# Patient Record
Sex: Female | Born: 1940
Health system: Southern US, Community
[De-identification: ages and names within clinical notes are randomized; demographics above are authoritative.]

## PROBLEM LIST (undated history)

## (undated) DIAGNOSIS — I2721 Secondary pulmonary arterial hypertension: Secondary | ICD-10-CM

## (undated) DIAGNOSIS — I509 Heart failure, unspecified: Secondary | ICD-10-CM

## (undated) DIAGNOSIS — I440 Atrioventricular block, first degree: Secondary | ICD-10-CM

## (undated) DIAGNOSIS — E8881 Metabolic syndrome: Secondary | ICD-10-CM

## (undated) DIAGNOSIS — I447 Left bundle-branch block, unspecified: Secondary | ICD-10-CM

## (undated) DIAGNOSIS — I4891 Unspecified atrial fibrillation: Secondary | ICD-10-CM

## (undated) DIAGNOSIS — Z7901 Long term (current) use of anticoagulants: Secondary | ICD-10-CM

## (undated) DIAGNOSIS — J449 Chronic obstructive pulmonary disease, unspecified: Secondary | ICD-10-CM

## (undated) DIAGNOSIS — I428 Other cardiomyopathies: Secondary | ICD-10-CM

## (undated) DIAGNOSIS — I2510 Atherosclerotic heart disease of native coronary artery without angina pectoris: Secondary | ICD-10-CM

## (undated) DIAGNOSIS — E66813 Obesity, class 3: Secondary | ICD-10-CM

## (undated) DIAGNOSIS — M170 Bilateral primary osteoarthritis of knee: Secondary | ICD-10-CM

## (undated) DIAGNOSIS — K766 Portal hypertension: Secondary | ICD-10-CM

## (undated) DIAGNOSIS — R748 Abnormal levels of other serum enzymes: Secondary | ICD-10-CM

## (undated) DIAGNOSIS — I5020 Unspecified systolic (congestive) heart failure: Secondary | ICD-10-CM

## (undated) DIAGNOSIS — I10 Essential (primary) hypertension: Secondary | ICD-10-CM

## (undated) DIAGNOSIS — E785 Hyperlipidemia, unspecified: Secondary | ICD-10-CM

## (undated) DIAGNOSIS — I700 Atherosclerosis of aorta: Secondary | ICD-10-CM

## (undated) DIAGNOSIS — Z8541 Personal history of malignant neoplasm of cervix uteri: Secondary | ICD-10-CM

## (undated) HISTORY — DX: Other cardiomyopathies: I42.8

## (undated) HISTORY — DX: Morbid (severe) obesity due to excess calories: E66.01

## (undated) HISTORY — PX: CARDIAC CATHETERIZATION: SHX172

## (undated) HISTORY — DX: Metabolic syndrome and other insulin resistance: E88.81

## (undated) HISTORY — DX: Personal history of malignant neoplasm of cervix uteri: Z85.41

## (undated) HISTORY — DX: Unspecified systolic (congestive) heart failure: I50.20

## (undated) HISTORY — DX: Essential (primary) hypertension: I10

## (undated) HISTORY — DX: Atherosclerotic heart disease of native coronary artery without angina pectoris: I25.10

## (undated) HISTORY — DX: Left bundle-branch block, unspecified: I44.7

## (undated) HISTORY — DX: Atrioventricular block, first degree: I44.0

## (undated) HISTORY — DX: Secondary pulmonary arterial hypertension: I27.21

## (undated) HISTORY — DX: Metabolic syndrome: E88.810

## (undated) HISTORY — DX: Obesity, class 3: E66.813

## (undated) HISTORY — DX: Hyperlipidemia, unspecified: E78.5

## (undated) HISTORY — DX: Portal hypertension: I27.21

## (undated) HISTORY — DX: Abnormal levels of other serum enzymes: R74.8

## (undated) HISTORY — DX: Bilateral primary osteoarthritis of knee: M17.0

## (undated) HISTORY — DX: Secondary pulmonary arterial hypertension: K76.6

---

## 1994-09-13 HISTORY — PX: ABDOMINAL HYSTERECTOMY: SHX81

## 1997-09-13 DIAGNOSIS — C539 Malignant neoplasm of cervix uteri, unspecified: Secondary | ICD-10-CM

## 1997-09-13 HISTORY — DX: Malignant neoplasm of cervix uteri, unspecified: C53.9

## 2006-09-13 HISTORY — PX: CATARACT EXTRACTION: SUR2

## 2007-11-21 DIAGNOSIS — E8940 Asymptomatic postprocedural ovarian failure: Secondary | ICD-10-CM

## 2008-01-09 ENCOUNTER — Ambulatory Visit: Payer: Self-pay | Admitting: Family Medicine

## 2008-03-12 ENCOUNTER — Ambulatory Visit: Payer: Self-pay | Admitting: Ophthalmology

## 2009-03-19 ENCOUNTER — Ambulatory Visit: Payer: Self-pay | Admitting: Family Medicine

## 2013-08-13 ENCOUNTER — Encounter: Payer: Self-pay | Admitting: Family Medicine

## 2014-09-25 DIAGNOSIS — Z0000 Encounter for general adult medical examination without abnormal findings: Secondary | ICD-10-CM | POA: Diagnosis not present

## 2014-09-25 DIAGNOSIS — Z1239 Encounter for other screening for malignant neoplasm of breast: Secondary | ICD-10-CM | POA: Diagnosis not present

## 2014-09-25 DIAGNOSIS — Z7189 Other specified counseling: Secondary | ICD-10-CM | POA: Diagnosis not present

## 2014-09-25 DIAGNOSIS — Z9181 History of falling: Secondary | ICD-10-CM | POA: Diagnosis not present

## 2014-09-25 DIAGNOSIS — Z1389 Encounter for screening for other disorder: Secondary | ICD-10-CM | POA: Diagnosis not present

## 2015-03-03 ENCOUNTER — Ambulatory Visit (INDEPENDENT_AMBULATORY_CARE_PROVIDER_SITE_OTHER): Payer: Medicare Other | Admitting: Family Medicine

## 2015-03-03 ENCOUNTER — Encounter: Payer: Self-pay | Admitting: Family Medicine

## 2015-03-03 VITALS — BP 138/76 | HR 67 | Temp 97.9°F | Resp 16 | Ht 66.75 in | Wt 272.0 lb

## 2015-03-03 DIAGNOSIS — E785 Hyperlipidemia, unspecified: Secondary | ICD-10-CM | POA: Insufficient documentation

## 2015-03-03 DIAGNOSIS — E8881 Metabolic syndrome: Secondary | ICD-10-CM | POA: Insufficient documentation

## 2015-03-03 DIAGNOSIS — M1710 Unilateral primary osteoarthritis, unspecified knee: Secondary | ICD-10-CM | POA: Insufficient documentation

## 2015-03-03 DIAGNOSIS — J069 Acute upper respiratory infection, unspecified: Secondary | ICD-10-CM

## 2015-03-03 DIAGNOSIS — I10 Essential (primary) hypertension: Secondary | ICD-10-CM | POA: Insufficient documentation

## 2015-03-03 DIAGNOSIS — M179 Osteoarthritis of knee, unspecified: Secondary | ICD-10-CM | POA: Insufficient documentation

## 2015-03-03 DIAGNOSIS — Z8541 Personal history of malignant neoplasm of cervix uteri: Secondary | ICD-10-CM | POA: Insufficient documentation

## 2015-03-03 MED ORDER — FLUTICASONE PROPIONATE 50 MCG/ACT NA SUSP: 2.0000 | Freq: Every day | NASAL | Status: DC

## 2015-03-03 NOTE — Progress Notes (Signed)
Name: Kimberly Henry   MRN: [id number]    DOB: 1940-09-22   Date:03/03/2015       Progress Note  Subjective  Chief Complaint  Chief Complaint  Patient presents with  . Sinusitis    patient states he has had symptoms for a week. Patient presents with dry cough & itchy throat. Patient has tried otc Mucinex.    HPI  URI: she states that last week she had nasal congestion, and post-nasal drainage associated with a tickle on her throat and a cough. No fever, no facial pain or SOB, no change in appetite, no chest pain . She states she tried mucinex oct and is feeling much better now. Only problem now is a very mild intermittent dizziness / no vertigo, and the mild cough   Patient Active Problem List   Diagnosis Date Noted  . Metabolic syndrome 46/27/0350  . History of cervical cancer 03/03/2015  . Hypertension, benign 03/03/2015  . Osteoarthritis, knee 03/03/2015  . Hyperlipidemia 03/03/2015    History  Substance Use Topics  . Smoking status: Former Research scientist (life sciences)  . Smokeless tobacco: Not on file  . Alcohol Use: No     Current outpatient prescriptions:  .  acetaminophen (TYLENOL) 500 MG chewable tablet, Chew 500 mg by mouth every 6 (six) hours as needed for pain., Disp: , Rfl:  .  aspirin EC 81 MG tablet, Take 81 mg by mouth daily., Disp: , Rfl:  .  lisinopril-hydrochlorothiazide (PRINZIDE,ZESTORETIC) 20-12.5 MG per tablet, Take 1 tablet by mouth daily., Disp: , Rfl:   No Known Allergies  ROS  Ten systems reviewed and is negative except as mentioned in HPI   Objective  Filed Vitals:   03/03/15 0855  BP: 138/76  Pulse: 67  Temp: 97.9 F (36.6 C)  TempSrc: Oral  Resp: 16  Height: 5' 6.75" (1.695 m)  Weight: 272 lb (123.378 kg)  SpO2: 95%    Body mass index is 42.94 kg/(m^2).    Physical Exam  Constitutional: Patient appears well-developed and well-nourished. No distress.  Eyes:  No scleral icterus.  Neck: Normal range of motion. Neck supple. HEENT: head atraumatic  normocephalic, nasal turbinate swollen and pale, no post-nasal drainage, no erythema of posterior pharynx, sinus not tender Cardiovascular: Normal rate, regular rhythm and normal heart sounds.  No murmur heard. No BLE edema. Pulmonary/Chest: Effort normal and breath sounds normal. No respiratory distress. Abdominal: Soft.  There is no tenderness. Psychiatric: Patient has a normal mood and affect. behavior is normal. Judgment and thought content normal. Neuro: no nystagmus, neg Romberg     Assessment & Plan  1. Upper respiratory infection Versus AR, we will give her a nasal steroid. Normal neuro exam  - fluticasone (FLONASE) 50 MCG/ACT nasal spray; Place 2 sprays into both nostrils daily.  Dispense: 16 g; Refill: 2

## 2015-03-25 ENCOUNTER — Encounter: Payer: Self-pay | Admitting: Family Medicine

## 2015-03-25 DIAGNOSIS — I440 Atrioventricular block, first degree: Secondary | ICD-10-CM | POA: Insufficient documentation

## 2015-03-28 ENCOUNTER — Ambulatory Visit: Payer: Self-pay | Admitting: Family Medicine

## 2015-05-30 ENCOUNTER — Encounter: Payer: Self-pay | Admitting: Family Medicine

## 2015-05-30 ENCOUNTER — Ambulatory Visit (INDEPENDENT_AMBULATORY_CARE_PROVIDER_SITE_OTHER): Payer: Medicare Other | Admitting: Family Medicine

## 2015-05-30 VITALS — BP 138/68 | HR 83 | Temp 98.8°F | Resp 16 | Ht 65.0 in | Wt 271.1 lb

## 2015-05-30 DIAGNOSIS — E8881 Metabolic syndrome: Secondary | ICD-10-CM

## 2015-05-30 DIAGNOSIS — J3089 Other allergic rhinitis: Secondary | ICD-10-CM

## 2015-05-30 DIAGNOSIS — E785 Hyperlipidemia, unspecified: Secondary | ICD-10-CM

## 2015-05-30 DIAGNOSIS — R05 Cough: Secondary | ICD-10-CM | POA: Diagnosis not present

## 2015-05-30 DIAGNOSIS — E669 Obesity, unspecified: Secondary | ICD-10-CM

## 2015-05-30 DIAGNOSIS — Z23 Encounter for immunization: Secondary | ICD-10-CM | POA: Diagnosis not present

## 2015-05-30 DIAGNOSIS — J302 Other seasonal allergic rhinitis: Secondary | ICD-10-CM

## 2015-05-30 DIAGNOSIS — J309 Allergic rhinitis, unspecified: Secondary | ICD-10-CM

## 2015-05-30 DIAGNOSIS — I10 Essential (primary) hypertension: Secondary | ICD-10-CM

## 2015-05-30 DIAGNOSIS — E668 Other obesity: Secondary | ICD-10-CM

## 2015-05-30 DIAGNOSIS — R059 Cough, unspecified: Secondary | ICD-10-CM

## 2015-05-30 MED ORDER — LOSARTAN POTASSIUM-HCTZ 50-12.5 MG PO TABS
1.0000 | ORAL_TABLET | Freq: Every day | ORAL | Status: DC
Start: 2015-05-30 — End: 2015-10-10

## 2015-05-30 MED ORDER — LEVOCETIRIZINE DIHYDROCHLORIDE 5 MG PO TABS: 5.0000 mg | ORAL_TABLET | Freq: Every evening | ORAL | Status: DC

## 2015-05-30 MED ORDER — FLUTICASONE PROPIONATE 50 MCG/ACT NA SUSP: 2.0000 | Freq: Every day | NASAL | Status: DC

## 2015-05-30 NOTE — Progress Notes (Signed)
Name: Kimberly Henry   MRN: [id number]    DOB: Jan 21, 1941   Date:05/30/2015       Progress Note  Subjective  Chief Complaint  Chief Complaint  Patient presents with  . Allergic Rhinitis     onset several months itchy throat, coughing    HPI   AR: she has been having severe symptoms in the past few months, nasal congestion, rhinorrhea and a tickling sensation on her throat . She tried nasal steroid, but not consistently and is not using it correctly. Not taking otc medication, she tried loratadine in the past with mild improvement. No SOB, no wheezing  HTN: taking lisinopril/HCTZ, she has a dry cough that is going on for months, we will try stopping medication / ACE and see if cough will resolve, we will also check a CXR to rule out other causes, but it may be secondary to allergic rhinitis also.   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   Obesity: not exercising or changing her diet, weight is stable, but discussed importance of losing weight to improve quality of life and decreased cardiovascular risk  Patient Active Problem List   Diagnosis Date Noted  . Perennial allergic rhinitis with seasonal variation 05/30/2015  . 1St degree AV block 03/25/2015  . Extreme obesity 03/25/2015  . Metabolic syndrome 35/32/9924  . History of cervical cancer 03/03/2015  . Hypertension, benign 03/03/2015  . Osteoarthritis, knee 03/03/2015  . Hyperlipidemia 03/03/2015  . Failed, ovarian, postablative 11/21/2007    Past Surgical History  Procedure Laterality Date  . Abdominal hysterectomy  1996  . Cataract extraction Left 2008    Family History  Problem Relation Age of Onset  . Hypertension Mother   . Congestive Heart Failure Mother   . Congestive Heart  Failure Father     Social History   Social History  . Marital Status: Widowed    Spouse Name: N/A  . Number of Children: N/A  . Years of Education: N/A   Occupational History  . Not on file.   Social History Main Topics  . Smoking status: Former Research scientist (life sciences)  . Smokeless tobacco: Never Used  . Alcohol Use: No  . Drug Use: No  . Sexual Activity: No   Other Topics Concern  . Not on file   Social History Narrative     Current outpatient prescriptions:  .  acetaminophen (TYLENOL) 500 MG chewable tablet, Chew 500 mg by mouth every 6 (six) hours as needed for pain., Disp: , Rfl:  .  aspirin EC 81 MG tablet, Take 81 mg by mouth daily., Disp: , Rfl:  .  fluticasone (FLONASE) 50 MCG/ACT nasal spray, Place 2 sprays into both nostrils daily., Disp: 48 g, Rfl: 1 .  levocetirizine (XYZAL) 5 MG tablet, Take 1 tablet (5 mg total) by mouth every evening., Disp: 90 tablet, Rfl: 1 .  losartan-hydrochlorothiazide (HYZAAR) 50-12.5 MG per tablet, Take 1 tablet by mouth daily., Disp: 90 tablet, Rfl: 1  Allergies  Allergen Reactions  . Ace Inhibitors Cough  . Lovastatin     Other reaction(s): Muscle Pain     ROS  Constitutional: Negative for fever or weight change.  Respiratory: Positive for  cough no shortness of breath.   Cardiovascular:  Negative for chest pain or palpitations.  Gastrointestinal: Negative for abdominal pain, no bowel changes.  Musculoskeletal: Positive  for gait problem or joint swelling.  Skin: Negative for rash.  Neurological: Negative for dizziness or headache.  No other specific complaints in a complete review of systems (except as listed in HPI above).  Objective  Filed Vitals:   05/30/15 1357  BP: 138/68  Pulse: 83  Temp: 98.8 F (37.1 C)  TempSrc: Oral  Resp: 16  Height: 5\' 5"  (1.651 m)  Weight: 271 lb 1.6 oz (122.97 kg)  SpO2: 97%    Body mass index is 45.11 kg/(m^2).  Physical Exam  Constitutional: Patient appears well-developed and  well-nourished. Obese No distress.  HEENT: head atraumatic, normocephalic, pupils equal and reactive to light,  neck supple, throat within normal limits Cardiovascular: Normal rate, regular rhythm and normal heart sounds.  No murmur heard. No BLE edema. Pulmonary/Chest: Effort normal and breath sounds normal. No respiratory distress. Abdominal: Soft.  There is no tenderness. Psychiatric: Patient has a normal mood and affect. behavior is normal. Judgment and thought content normal. Muscular Skeletal: crepitus with extension of both knees   PHQ2/9: Depression screen [facility] 2/9 05/30/2015 03/03/2015  Decreased Interest 0 0  Down, Depressed, Hopeless 0 0  PHQ - 2 Score 0 0    Fall Risk: Fall Risk  05/30/2015 03/03/2015  Falls in the past year? Yes No  Number falls in past yr: 1 -  Injury with Fall? No -      Functional Status Survey: Is the patient deaf or have difficulty hearing?: No Does the patient have difficulty seeing, even when wearing glasses/contacts?: Yes (glasses) Does the patient have difficulty concentrating, remembering, or making decisions?: No Does the patient have difficulty walking or climbing stairs?: No Does the patient have difficulty dressing or bathing?: No Does the patient have difficulty doing errands alone such as visiting a doctor's office or shopping?: No    Assessment & Plan  1. Hypertension, benign Stop Ace, try Losartan because of cough - losartan-hydrochlorothiazide (HYZAAR) 50-12.5 MG per tablet; Take 1 tablet by mouth daily.  Dispense: 90 tablet; Refill: 1  2. Needs flu shot  - Flu Vaccine QUAD 36+ mos PF IM (Fluarix & Fluzone Quad PF)  3. Metabolic syndrome Discussed diet and exercise  4. Hyperlipidemia Discussed diet  5. Extreme obesity Discussed with the patient the risk posed by an increased BMI. Discussed importance of portion control, calorie counting and at least 150 minutes of physical activity weekly. Avoid sweet beverages and drink  more water. Eat at least 6 servings of fruit and vegetables daily   6. Perennial allergic rhinitis with seasonal variation We will try adding Xyzal explained again on how to use nasal spray - levocetirizine (XYZAL) 5 MG tablet; Take 1 tablet (5 mg total) by mouth every evening.  Dispense: 90 tablet; Refill: 1 - fluticasone (FLONASE) 50 MCG/ACT nasal spray; Place 2 sprays into both nostrils daily.  Dispense: 48 g; Refill: 1  7. Cough Check CXR, she does not want labs today, change to losartan from ACE - losartan-hydrochlorothiazide (HYZAAR) 50-12.5 MG per tablet; Take 1 tablet by mouth daily.  Dispense: 90 tablet; Refill: 1 - DG Chest 2 View; Future  8. Need for pneumococcal vaccination  - Pneumococcal conjugate vaccine 13-valent IM

## 2015-08-15 ENCOUNTER — Ambulatory Visit (INDEPENDENT_AMBULATORY_CARE_PROVIDER_SITE_OTHER): Payer: Medicare Other | Admitting: Family Medicine

## 2015-08-15 ENCOUNTER — Encounter: Payer: Self-pay | Admitting: Family Medicine

## 2015-08-15 VITALS — BP 142/78 | HR 85 | Temp 98.4°F | Resp 14 | Wt 271.4 lb

## 2015-08-15 DIAGNOSIS — I10 Essential (primary) hypertension: Secondary | ICD-10-CM

## 2015-08-15 DIAGNOSIS — M170 Bilateral primary osteoarthritis of knee: Secondary | ICD-10-CM | POA: Diagnosis not present

## 2015-08-15 MED ORDER — LIDOCAINE HCL (PF) 1 % IJ SOLN
2.0000 mL | Freq: Once | INTRAMUSCULAR | Status: AC
  Administered 2015-08-15: 2 mL via INTRADERMAL

## 2015-08-15 MED ORDER — TRIAMCINOLONE ACETONIDE 40 MG/ML IJ SUSP: 40.0000 mg | Freq: Once | INTRAMUSCULAR | Status: DC

## 2015-08-15 NOTE — Progress Notes (Signed)
Name: Kimberly Henry   MRN: [id number]    DOB: 11-19-1940   Date:08/15/2015       Progress Note  Subjective  Chief Complaint  Chief Complaint  Patient presents with  . Knee Pain    bilateral soreness and stiffness    HPI  HTN: she skipped her medication this am, bp is elevated, no chest pain or palpitation. Usually bp is under control. Medication was changed from ACe to ARB and cough has resolved.   OA: she has a long history of OA of both knees. Pain was severe last night. Right side worse than left side. Pain is described as aching, constant, worse with activity and at the end of the day. She has been limping. No effusion or redness. She wears a brace and seems to help at times. She had a steroid injection in the past ( last year ) and it helped with symptoms and would like another injection.   Patient Active Problem List   Diagnosis Date Noted  . Perennial allergic rhinitis with seasonal variation 05/30/2015  . 1St degree AV block 03/25/2015  . Extreme obesity ([place]) 03/25/2015  . Metabolic syndrome [id number]  . History of cervical cancer 03/03/2015  . Hypertension, benign 03/03/2015  . Osteoarthritis, knee 03/03/2015  . Hyperlipidemia 03/03/2015  . Failed, ovarian, postablative 11/21/2007    Past Surgical History  Procedure Laterality Date  . Abdominal hysterectomy  1996  . Cataract extraction Left 2008    Family History  Problem Relation Age of Onset  . Hypertension Mother   . Congestive Heart Failure Mother   . Congestive Heart Failure Father     Social History   Social History  . Marital Status: Widowed    Spouse Name: N/A  . Number of Children: N/A  . Years of Education: N/A   Occupational History  . Not on file.   Social History Main Topics  . Smoking status: Former Research scientist (life sciences)  . Smokeless tobacco: Never Used  . Alcohol Use: No  . Drug Use: No  . Sexual Activity: No   Other Topics Concern  . Not on file   Social History Narrative     Current  outpatient prescriptions:  .  acetaminophen (TYLENOL) 500 MG chewable tablet, Chew 500 mg by mouth every 6 (six) hours as needed for pain., Disp: , Rfl:  .  aspirin EC 81 MG tablet, Take 81 mg by mouth daily., Disp: , Rfl:  .  fluticasone (FLONASE) 50 MCG/ACT nasal spray, Place 2 sprays into both nostrils daily., Disp: 48 g, Rfl: 1 .  levocetirizine (XYZAL) 5 MG tablet, Take 1 tablet (5 mg total) by mouth every evening., Disp: 90 tablet, Rfl: 1 .  losartan-hydrochlorothiazide (HYZAAR) 50-12.5 MG per tablet, Take 1 tablet by mouth daily., Disp: 90 tablet, Rfl: 1  Allergies  Allergen Reactions  . Ace Inhibitors Cough  . Lovastatin     Other reaction(s): Muscle Pain     ROS  Ten systems reviewed and is negative except as mentioned in HPI   Filed Vitals:   08/15/15 1305  BP: 142/78  Pulse: 85  Temp: 98.4 F (36.9 C)  TempSrc: Oral  Resp: 14  Weight: 271 lb 6.4 oz (123.106 kg)  SpO2: 97%    Body mass index is 45.16 kg/(m^2).  Physical Exam  Constitutional: Patient appears well-developed and well-nourished. Obese  No distress.  HEENT: head atraumatic, normocephalic, pupils equal and reactive to light,  neck supple, throat within normal limits Cardiovascular: Normal rate,  regular rhythm and normal heart sounds.  No murmur heard. No BLE edema. Pulmonary/Chest: Effort normal and breath sounds normal. No respiratory distress. Abdominal: Soft.  There is no tenderness. Psychiatric: Patient has a normal mood and affect. behavior is normal. Judgment and thought content normal. Muscular Skeletal: crepitus with extension of both knees, no effusion or redness during exam today   PHQ2/9: Depression screen [facility] 2/9 08/15/2015 05/30/2015 03/03/2015  Decreased Interest 0 0 0  Down, Depressed, Hopeless 0 0 0  PHQ - 2 Score 0 0 0     Fall Risk: Fall Risk  08/15/2015 05/30/2015 03/03/2015  Falls in the past year? No Yes No  Number falls in past yr: - 1 -  Injury with Fall? - No -       Functional Status Survey: Is the patient deaf or have difficulty hearing?: No Does the patient have difficulty seeing, even when wearing glasses/contacts?: Yes (glasess) Does the patient have difficulty concentrating, remembering, or making decisions?: No Does the patient have difficulty walking or climbing stairs?: No Does the patient have difficulty dressing or bathing?: No Does the patient have difficulty doing errands alone such as visiting a doctor's office or shopping?: No    Assessment & Plan  1. Hypertension, benign  Discussed importance of taking medication daily   2. Primary osteoarthritis of both knees   Consent signed: YES  Procedure: Knee Joint Injection Location: right knee Injection approach: lateral knee Equipment used: 25 gauge 1.5 inch needle Medication: 2 mL Kenalog (40mg /50mL) Anesthesia: 1% Lidocaine w/o Epinephrine Cleaned and prepped: Betadine  The risks, benefits, treatment options discussed with patient prior to procedure.  Consent signed. Area cleansed with sterile betadine.    Patient tolerated procedure well with no complications and no bleeding. Bandage placed at site of injection. Patient instructed on potential for steroid reaction pain within the initial 24-48hr period. May use ice packs directly on injected site as needed.

## 2015-08-18 ENCOUNTER — Other Ambulatory Visit: Payer: Self-pay | Admitting: Family Medicine

## 2015-08-18 NOTE — Telephone Encounter (Signed)
Patient was at work and stated that she will call back to make the appointment in January. She did state that she is completely out of lisinopril and would like for you to call her in enough to [place] to last her until the 16th when she will receive the other refill from her mail pharmacy.

## 2015-10-06 ENCOUNTER — Other Ambulatory Visit: Payer: Self-pay | Admitting: Family Medicine

## 2015-10-06 NOTE — Telephone Encounter (Signed)
Patient requesting refill. 

## 2015-10-09 ENCOUNTER — Telehealth: Payer: Self-pay

## 2015-10-09 NOTE — Telephone Encounter (Signed)
Patient states she is not taking the Losartan-HCTZ, but is taking the Lisinopril-HCTZ. I asked her if that medication gave her a cough and she stated she was not having any side effects with this medication. Did you want her to switch?

## 2015-10-10 ENCOUNTER — Other Ambulatory Visit: Payer: Self-pay | Admitting: Family Medicine

## 2015-10-10 MED ORDER — LISINOPRIL-HYDROCHLOROTHIAZIDE 20-12.5 MG PO TABS: 1.0000 | ORAL_TABLET | Freq: Every day | ORAL | Status: DC

## 2015-10-10 NOTE — Telephone Encounter (Signed)
Changed back to lisinopril/hctz since that is what she is taking at this time

## 2016-04-17 ENCOUNTER — Other Ambulatory Visit: Payer: Self-pay | Admitting: Family Medicine

## 2016-04-20 NOTE — Telephone Encounter (Signed)
Not able to leave message her mailbox is full

## 2016-06-22 ENCOUNTER — Ambulatory Visit (INDEPENDENT_AMBULATORY_CARE_PROVIDER_SITE_OTHER): Payer: Medicare Other | Admitting: Family Medicine

## 2016-06-22 ENCOUNTER — Encounter: Payer: Self-pay | Admitting: Family Medicine

## 2016-06-22 VITALS — BP 132/76 | HR 80 | Temp 98.2°F | Resp 18 | Ht 65.0 in | Wt 267.1 lb

## 2016-06-22 DIAGNOSIS — R739 Hyperglycemia, unspecified: Secondary | ICD-10-CM | POA: Diagnosis not present

## 2016-06-22 DIAGNOSIS — Z23 Encounter for immunization: Secondary | ICD-10-CM

## 2016-06-22 DIAGNOSIS — I10 Essential (primary) hypertension: Secondary | ICD-10-CM

## 2016-06-22 DIAGNOSIS — E785 Hyperlipidemia, unspecified: Secondary | ICD-10-CM

## 2016-06-22 DIAGNOSIS — J302 Other seasonal allergic rhinitis: Secondary | ICD-10-CM

## 2016-06-22 DIAGNOSIS — E8881 Metabolic syndrome: Secondary | ICD-10-CM | POA: Diagnosis not present

## 2016-06-22 DIAGNOSIS — J3089 Other allergic rhinitis: Secondary | ICD-10-CM

## 2016-06-22 DIAGNOSIS — M170 Bilateral primary osteoarthritis of knee: Secondary | ICD-10-CM

## 2016-06-22 DIAGNOSIS — E668 Other obesity: Secondary | ICD-10-CM

## 2016-06-22 MED ORDER — LISINOPRIL-HYDROCHLOROTHIAZIDE 20-12.5 MG PO TABS: 1.0000 | ORAL_TABLET | Freq: Every day | ORAL | 1 refills | Status: DC

## 2016-06-22 MED ORDER — FLUTICASONE PROPIONATE 50 MCG/ACT NA SUSP: 2.0000 | Freq: Every day | NASAL | 1 refills | Status: DC

## 2016-06-22 MED ORDER — LEVOCETIRIZINE DIHYDROCHLORIDE 5 MG PO TABS: 5.0000 mg | ORAL_TABLET | Freq: Every evening | ORAL | 1 refills | Status: DC

## 2016-06-22 NOTE — Progress Notes (Signed)
Name: Kimberly Henry   MRN: [id number]    DOB: 06-22-1941   Date:06/22/2016       Progress Note  Subjective  Chief Complaint  Chief Complaint  Patient presents with  . Medication Refill  . Hypertension    Patient denies any side effects.  . Osteoarthritis    Bilateral knees but worst in the right knee.   . Allergic Rhinitis     Unchanged, states the medication help keeps symptom stable.    HPI  HTN: she has been taking medication daily, but based on prescription she should have been out of medication. She denies side effects of medication. No chest pain or palpitation, denies orthopnea or PND  OA: she has a long history of OA of both knees. Pain has been better controlled, last steroid injection Dec 2016, she has mild aching sensation, no instability. She states it feels stiff so she takes Aleve twice daily. She still works full time at [employer], activity makes it worse, better with rest. Pain free when at rest  Perennial allergic rhinitis: she has been taking Xyzal and also nasal steroid prn, she states currently no symptoms ( congestion, sneezing or post-nasal drainage ) but flares Fall and Spring  Obesity: she has decreased portion size and has lost about 5 lbs since last year. She states she will try to avoid sweet  Dyslipidemia: she states Lovastatin caused muscle pain but never tried other statins, we will recheck labs  Patient Active Problem List   Diagnosis Date Noted  . Perennial allergic rhinitis with seasonal variation 05/30/2015  . 1st degree AV block 03/25/2015  . Extreme obesity ([place]) 03/25/2015  . Metabolic syndrome A999333  . History of cervical cancer 03/03/2015  . Hypertension, benign 03/03/2015  . Osteoarthritis, knee 03/03/2015  . Dyslipidemia 03/03/2015  . Failed, ovarian, postablative 11/21/2007    Past Surgical History:  Procedure Laterality Date  . ABDOMINAL HYSTERECTOMY  1996  . CATARACT EXTRACTION Left 2008    Family  History  Problem Relation Age of Onset  . Hypertension Mother   . Congestive Heart Failure Mother   . Congestive Heart Failure Father     Social History   Social History  . Marital status: Widowed    Spouse name: N/A  . Number of children: N/A  . Years of education: N/A   Occupational History  . Not on file.   Social History Main Topics  . Smoking status: Former Smoker    Years: 10.00    Types: Cigarettes    Start date: 09/14/1975    Quit date: 06/22/1986  . Smokeless tobacco: Never Used  . Alcohol use No  . Drug use: No  . Sexual activity: No   Other Topics Concern  . Not on file   Social History Narrative  . No narrative on file     Current Outpatient Prescriptions:  .  acetaminophen (TYLENOL) 500 MG chewable tablet, Chew 500 mg by mouth every 6 (six) hours as needed for pain., Disp: , Rfl:  .  aspirin EC 81 MG tablet, Take 81 mg by mouth daily., Disp: , Rfl:  .  fluticasone (FLONASE) 50 MCG/ACT nasal spray, Place 2 sprays into both nostrils daily., Disp: 48 g, Rfl: 1 .  levocetirizine (XYZAL) 5 MG tablet, Take 1 tablet (5 mg total) by mouth every evening., Disp: 90 tablet, Rfl: 1 .  lisinopril-hydrochlorothiazide (PRINZIDE,ZESTORETIC) 20-12.5 MG tablet, Take 1 tablet by mouth daily., Disp: 90 tablet, Rfl: 1  Allergies  Allergen Reactions  . Lovastatin     Other reaction(s): Muscle Pain     ROS  Constitutional: Negative for fever or significant  weight change.  Respiratory: Negative for cough and shortness of breath.   Cardiovascular: Negative for chest pain or palpitations.  Gastrointestinal: Negative for abdominal pain, no bowel changes.  Musculoskeletal: Positive  for gait problem and occasional  joint swelling.  Skin: Negative for rash.  Neurological: Negative for dizziness or headache.  No other specific complaints in a complete review of systems (except as listed in HPI above).  Objective  Vitals:   06/22/16 1107  BP: 132/76  Pulse: 80  Resp:  18  Temp: 98.2 F (36.8 C)  TempSrc: Oral  SpO2: 97%  Weight: 267 lb 1.6 oz (121.2 kg)  Height: 5\' 5"  (1.651 m)    Body mass index is 44.45 kg/m.  Physical Exam  Constitutional: Patient appears well-developed and well-nourished. Obese No distress.  HEENT: head atraumatic, normocephalic, pupils equal and reactive to light,  neck supple, throat within normal limits Cardiovascular: Normal rate, regular rhythm and normal heart sounds.  No murmur heard. Trace  BLE edema -wearing compression stocking hose. Pulmonary/Chest: Effort normal and breath sounds normal. No respiratory distress. Abdominal: Soft.  There is no tenderness. Psychiatric: Patient has a normal mood and affect. behavior is normal. Judgment and thought content normal. Muscular Skeletal: crepitus with extension of both knees  PHQ2/9: Depression screen [facility] 2/9 06/22/2016 08/15/2015 05/30/2015 03/03/2015  Decreased Interest 0 0 0 0  Down, Depressed, Hopeless 0 0 0 0  PHQ - 2 Score 0 0 0 0    Fall Risk: Fall Risk  06/22/2016 08/15/2015 05/30/2015 03/03/2015  Falls in the past year? No No Yes No  Number falls in past yr: - - 1 -  Injury with Fall? - - No -     Functional Status Survey: Is the patient deaf or have difficulty hearing?: No Does the patient have difficulty seeing, even when wearing glasses/contacts?: No Does the patient have difficulty concentrating, remembering, or making decisions?: No Does the patient have difficulty walking or climbing stairs?: No Does the patient have difficulty dressing or bathing?: No Does the patient have difficulty doing errands alone such as visiting a doctor's office or shopping?: No    Assessment & Plan  1. Hypertension, benign  - lisinopril-hydrochlorothiazide (PRINZIDE,ZESTORETIC) 20-12.5 MG tablet; Take 1 tablet by mouth daily.  Dispense: 90 tablet; Refill: 1 - CBC with Differential/Platelet - COMPLETE METABOLIC PANEL WITH GFR  2. Needs flu shot  - Flu vaccine HIGH  DOSE PF (Fluzone High dose)  3. Primary osteoarthritis of both knees  Advised to stop taking Aleve daily because it can cause kidney dysfunction, advised to try Tylenol three times daily   4. Metabolic syndrome  Discussed life style modifications   5. Dyslipidemia  - Lipid panel  6. Extreme obesity ([place])  Discussed with the patient the risk posed by an increased BMI. Discussed importance of portion control, calorie counting and at least 150 minutes of physical activity weekly. Avoid sweet beverages and drink more water. Eat at least 6 servings of fruit and vegetables daily   7. Perennial allergic rhinitis with seasonal variation  - levocetirizine (XYZAL) 5 MG tablet; Take 1 tablet (5 mg total) by mouth every evening.  Dispense: 90 tablet; Refill: 1 - fluticasone (FLONASE) 50 MCG/ACT nasal spray; Place 2 sprays into both nostrils daily.  Dispense: 48 g; Refill: 1  8. Hyperglycemia  - Hemoglobin A1c

## 2016-06-23 ENCOUNTER — Telehealth: Payer: Self-pay | Admitting: Family Medicine

## 2016-06-23 ENCOUNTER — Other Ambulatory Visit: Payer: Self-pay | Admitting: Family Medicine

## 2016-06-23 MED ORDER — DICLOFENAC SODIUM 1 % TD GEL: 4.0000 g | Freq: Four times a day (QID) | TRANSDERMAL | 1 refills | Status: DC

## 2016-06-23 NOTE — Telephone Encounter (Signed)
Was seen yesterday and stated that she declined you writing a prescription for a cream for her knee. Patient has changed her mind and is asking that you write the prescription. [facility] send to [facility].

## 2016-06-23 NOTE — Telephone Encounter (Signed)
Called to notify patient and her Voicemail box was full.

## 2016-06-23 NOTE — Telephone Encounter (Signed)
Sent rx.

## 2016-06-24 NOTE — Telephone Encounter (Signed)
Tried contacting patient again to inform prescription has been sent to pharmacy, mailbox full.

## 2016-09-13 HISTORY — PX: CARDIAC CATHETERIZATION: SHX172

## 2016-10-19 ENCOUNTER — Ambulatory Visit (INDEPENDENT_AMBULATORY_CARE_PROVIDER_SITE_OTHER): Payer: Medicare Other | Admitting: Family Medicine

## 2016-10-19 ENCOUNTER — Encounter: Payer: Self-pay | Admitting: Family Medicine

## 2016-10-19 VITALS — BP 130/82 | HR 96 | Temp 98.8°F | Resp 16 | Ht 65.0 in | Wt 271.1 lb

## 2016-10-19 DIAGNOSIS — J069 Acute upper respiratory infection, unspecified: Secondary | ICD-10-CM

## 2016-10-19 MED ORDER — AZITHROMYCIN 250 MG PO TABS: ORAL_TABLET | ORAL | 0 refills | Status: DC

## 2016-10-19 MED ORDER — GUAIFENESIN-CODEINE 100-10 MG/5ML PO SYRP: 10.0000 mL | ORAL_SOLUTION | Freq: Three times a day (TID) | ORAL | 0 refills | Status: DC | PRN

## 2016-10-19 NOTE — Progress Notes (Signed)
Name: Kimberly Henry   MRN: [id number]    DOB: April 22, 1941   Date:10/19/2016       Progress Note  Subjective  Chief Complaint  Chief Complaint  Patient presents with  . URI    cough, congested, SOB for 4 days    Cough  This is a new problem. The current episode started in the past 7 days (3 days ago). The cough is non-productive. Associated symptoms include postnasal drip, shortness of breath and wheezing. Pertinent negatives include no chills, ear congestion, fever, headaches, nasal congestion or sore throat. She has tried OTC cough suppressant (Alka Seltzer plus and Robitussion cough) for the symptoms. The treatment provided mild relief. There is no history of bronchitis, COPD or pneumonia.     Past Medical History:  Diagnosis Date  . Acid phosphatase elevated   . AV block, 1st degree   . Hx of cervical malignancy   . Hyperlipidemia   . Hypertension   . Metabolic syndrome   . Obesity, Class III, BMI 40-49.9 (morbid obesity) ([place])   . Osteoarthritis of both knees     Past Surgical History:  Procedure Laterality Date  . ABDOMINAL HYSTERECTOMY  1996  . CATARACT EXTRACTION Left 2008    Family History  Problem Relation Age of Onset  . Hypertension Mother   . Congestive Heart Failure Mother   . Congestive Heart Failure Father     Social History   Social History  . Marital status: Widowed    Spouse name: N/A  . Number of children: N/A  . Years of education: N/A   Occupational History  . Not on file.   Social History Main Topics  . Smoking status: Former Smoker    Years: 10.00    Types: Cigarettes    Start date: 09/14/1975    Quit date: 06/22/1986  . Smokeless tobacco: Never Used  . Alcohol use No  . Drug use: No  . Sexual activity: No   Other Topics Concern  . Not on file   Social History Narrative  . No narrative on file     Current Outpatient Prescriptions:  .  acetaminophen (TYLENOL) 500 MG chewable tablet, Chew 500 mg by mouth every 6 (six) hours as  needed for pain., Disp: , Rfl:  .  diclofenac sodium (VOLTAREN) 1 % GEL, Apply 4 g topically 4 (four) times daily., Disp: 100 g, Rfl: 1 .  lisinopril-hydrochlorothiazide (PRINZIDE,ZESTORETIC) 20-12.5 MG tablet, Take 1 tablet by mouth daily., Disp: 90 tablet, Rfl: 1 .  aspirin EC 81 MG tablet, Take 81 mg by mouth daily., Disp: , Rfl:  .  fluticasone (FLONASE) 50 MCG/ACT nasal spray, Place 2 sprays into both nostrils daily. (Patient not taking: Reported on 10/19/2016), Disp: 48 g, Rfl: 1 .  levocetirizine (XYZAL) 5 MG tablet, Take 1 tablet (5 mg total) by mouth every evening. (Patient not taking: Reported on 10/19/2016), Disp: 90 tablet, Rfl: 1  Allergies  Allergen Reactions  . Lovastatin     Other reaction(s): Muscle Pain     Review of Systems  Constitutional: Negative for chills and fever.  HENT: Positive for postnasal drip. Negative for sore throat.   Respiratory: Positive for cough, shortness of breath and wheezing.   Neurological: Negative for headaches.    Objective  Vitals:   10/19/16 1431  BP: 130/82  Pulse: 96  Resp: 16  Temp: 98.8 F (37.1 C)  TempSrc: Oral  SpO2: 95%  Weight: 271 lb 1.6 oz (123 kg)  Height: 5'  5" (1.651 m)    Physical Exam  Constitutional: She is well-developed, well-nourished, and in no distress.  HENT:  Head: Normocephalic and atraumatic.  Right Ear: Tympanic membrane and ear canal normal. No drainage.  Left Ear: Tympanic membrane and ear canal normal. No drainage.  Nose: Right sinus exhibits no maxillary sinus tenderness and no frontal sinus tenderness. Left sinus exhibits no maxillary sinus tenderness and no frontal sinus tenderness.  Mouth/Throat: No posterior oropharyngeal erythema.  Cardiovascular: Normal rate, regular rhythm, S1 normal, S2 normal and normal heart sounds.   No murmur heard. Pulmonary/Chest: Effort normal and breath sounds normal. She has no decreased breath sounds. She has no wheezes. She has no rales.  Nursing note and  vitals reviewed.      Assessment & Plan  1. URI with cough and congestion Advised to start taking antibiotic if her symptoms do not improve within the next 48 hours. - azithromycin (ZITHROMAX) 250 MG tablet; 2 tabs po day 1,then 1 tab po q day x 4 days  Dispense: 6 tablet; Refill: 0 - guaiFENesin-codeine (CHERATUSSIN AC) 100-10 MG/5ML syrup; Take 10 mLs by mouth 3 (three) times daily as needed for cough.  Dispense: 150 mL; Refill: 0   Syed Asad [facility] 10/19/2016 2:44 PM

## 2016-10-21 ENCOUNTER — Telehealth: Payer: Self-pay | Admitting: Family Medicine

## 2016-10-21 ENCOUNTER — Other Ambulatory Visit: Payer: Self-pay | Admitting: Family Medicine

## 2016-10-21 DIAGNOSIS — I10 Essential (primary) hypertension: Secondary | ICD-10-CM

## 2016-10-21 DIAGNOSIS — J302 Other seasonal allergic rhinitis: Secondary | ICD-10-CM

## 2016-10-21 DIAGNOSIS — J3089 Other allergic rhinitis: Secondary | ICD-10-CM

## 2016-10-21 NOTE — Telephone Encounter (Signed)
Pt is requesting a refill on voltaren gel (generic form) states it need authorization. Please call [phone]

## 2016-10-21 NOTE — Telephone Encounter (Signed)
Patient requesting refill of Flonase, Xyzal and Lisinopril-HCTZ to [facility].

## 2016-10-27 DIAGNOSIS — H524 Presbyopia: Secondary | ICD-10-CM | POA: Diagnosis not present

## 2016-10-27 DIAGNOSIS — H5203 Hypermetropia, bilateral: Secondary | ICD-10-CM | POA: Diagnosis not present

## 2016-10-27 DIAGNOSIS — H25011 Cortical age-related cataract, right eye: Secondary | ICD-10-CM | POA: Diagnosis not present

## 2016-10-27 DIAGNOSIS — Z961 Presence of intraocular lens: Secondary | ICD-10-CM | POA: Diagnosis not present

## 2016-11-01 NOTE — Telephone Encounter (Signed)
Will you please do PA for voltaren gel?

## 2016-11-15 ENCOUNTER — Telehealth: Payer: Self-pay | Admitting: Family Medicine

## 2016-11-15 NOTE — Telephone Encounter (Signed)
Pt have diarrhea for 4 days and would like something called into her local pharmacy. I did tell her that she would probably have to schedule appt at which she did schedule for tomorrow, however she still wanted me to send this message to see if you would prescribe something verses her coming in.

## 2016-11-16 ENCOUNTER — Encounter: Payer: Self-pay | Admitting: Family Medicine

## 2016-11-16 ENCOUNTER — Ambulatory Visit: Payer: Medicare Other | Admitting: Family Medicine

## 2016-11-16 ENCOUNTER — Ambulatory Visit (INDEPENDENT_AMBULATORY_CARE_PROVIDER_SITE_OTHER): Payer: Medicare Other | Admitting: Family Medicine

## 2016-11-16 VITALS — BP 126/76 | HR 96 | Temp 97.8°F | Resp 16 | Ht 65.0 in | Wt 264.9 lb

## 2016-11-16 DIAGNOSIS — R197 Diarrhea, unspecified: Secondary | ICD-10-CM

## 2016-11-16 MED ORDER — METRONIDAZOLE 500 MG PO TABS: 500.0000 mg | ORAL_TABLET | Freq: Three times a day (TID) | ORAL | 0 refills | Status: DC

## 2016-11-16 MED ORDER — DICLOFENAC SODIUM 1 % TD GEL: 4.0000 g | Freq: Four times a day (QID) | TRANSDERMAL | 1 refills | Status: DC

## 2016-11-16 NOTE — Telephone Encounter (Signed)
Pt informed and will see you this morning

## 2016-11-16 NOTE — Addendum Note (Signed)
Addended by: Lolita Rieger D on: 11/16/2016 12:28 PM   Modules accepted: Orders

## 2016-11-16 NOTE — Progress Notes (Signed)
Name: Kimberly Henry   MRN: [id number]    DOB: 1940-09-19   Date:11/16/2016       Progress Note  Subjective  Chief Complaint  Chief Complaint  Patient presents with  . Diarrhea    Onset-2 weeks, feels like her symptoms are worst once she eats greasy foods. Has taken Loperamide with some relief. Also states right after eating her stomach aches and has to go straight to the bathroom.    HPI  Diarrhea: she was seen by Dr. Manuella Ghazi for an URI back on 10/19/2016 and was given a Z-pack, about 2 weeks later she noticed abdominal cramping followed by lose stools no blood or mucus every time after she eats. She took Pepto Bismol without help and Imodium with some improvement, but still has soft stools after she eats. Worse after a greasy meal. Diarrhea is not as severe but not resolving. She denies travelling prior to episode, no change in diet and denies abdominal pain, except for cramping prior to the bowel movement. She denies nausea or vomiting, appetite is good. She had some weight loss  Patient Active Problem List   Diagnosis Date Noted  . Perennial allergic rhinitis with seasonal variation 05/30/2015  . 1st degree AV block 03/25/2015  . Extreme obesity ([place]) 03/25/2015  . Metabolic syndrome [id number]  . History of cervical cancer 03/03/2015  . Hypertension, benign 03/03/2015  . Osteoarthritis, knee 03/03/2015  . Dyslipidemia 03/03/2015  . Failed, ovarian, postablative 11/21/2007    Past Surgical History:  Procedure Laterality Date  . ABDOMINAL HYSTERECTOMY  1996  . CATARACT EXTRACTION Left 2008    Family History  Problem Relation Age of Onset  . Hypertension Mother   . Congestive Heart Failure Mother   . Congestive Heart Failure Father     Social History   Social History  . Marital status: Widowed    Spouse name: N/A  . Number of children: N/A  . Years of education: N/A   Occupational History  . Not on file.   Social History Main Topics  . Smoking status: Former Smoker     Years: 10.00    Types: Cigarettes    Start date: 09/14/1975    Quit date: 06/22/1986  . Smokeless tobacco: Never Used  . Alcohol use No  . Drug use: No  . Sexual activity: No   Other Topics Concern  . Not on file   Social History Narrative  . No narrative on file     Current Outpatient Prescriptions:  .  acetaminophen (TYLENOL) 500 MG chewable tablet, Chew 500 mg by mouth every 6 (six) hours as needed for pain., Disp: , Rfl:  .  aspirin EC 81 MG tablet, Take 81 mg by mouth daily., Disp: , Rfl:  .  fluticasone (FLONASE) 50 MCG/ACT nasal spray, USE 2 SPRAYS IN EACH  NOSTRIL DAILY, Disp: 48 g, Rfl: 0 .  levocetirizine (XYZAL) 5 MG tablet, TAKE 1 TABLET BY MOUTH  EVERY EVENING, Disp: 90 tablet, Rfl: 0 .  lisinopril-hydrochlorothiazide (PRINZIDE,ZESTORETIC) 20-12.5 MG tablet, TAKE 1 TABLET BY MOUTH  DAILY, Disp: 90 tablet, Rfl: 0 .  diclofenac sodium (VOLTAREN) 1 % GEL, Apply 4 g topically 4 (four) times daily., Disp: 100 g, Rfl: 1 .  metroNIDAZOLE (FLAGYL) 500 MG tablet, Take 1 tablet (500 mg total) by mouth 3 (three) times daily., Disp: 21 tablet, Rfl: 0  Allergies  Allergen Reactions  . Lovastatin     Other reaction(s): Muscle Pain     ROS  Ten  systems reviewed and is negative except as mentioned in HPI - lost 6 lbs in the past month   Objective  Vitals:   11/16/16 1152  BP: 126/76  Pulse: 96  Resp: 16  Temp: 97.8 F (36.6 C)  TempSrc: Oral  SpO2: 98%  Weight: 264 lb 14.4 oz (120.2 kg)  Height: 5\' 5"  (1.651 m)    Body mass index is 44.08 kg/m.  Physical Exam  Constitutional: Patient appears well-developed and well-nourished. Obese No distress.  HEENT: head atraumatic, normocephalic, pupils equal and reactive to light,  neck supple, throat within normal limits Cardiovascular: Normal rate, regular rhythm and normal heart sounds.  No murmur heard. No BLE edema. Pulmonary/Chest: Effort normal and breath sounds normal. No respiratory distress. Abdominal: Soft.   There is no tenderness, mild discomfort on lower abdomen during palpation. No masses, normal bowel sounds.  Psychiatric: Patient has a normal mood and affect. behavior is normal. Judgment and thought content normal.  PHQ2/9: Depression screen [facility] 2/9 11/16/2016 06/22/2016 08/15/2015 05/30/2015 03/03/2015  Decreased Interest 0 0 0 0 0  Down, Depressed, Hopeless 0 0 0 0 0  PHQ - 2 Score 0 0 0 0 0     Fall Risk: Fall Risk  11/16/2016 06/22/2016 08/15/2015 05/30/2015 03/03/2015  Falls in the past year? No No No Yes No  Number falls in past yr: - - - 1 -  Injury with Fall? - - - No -    Functional Status Survey: Is the patient deaf or have difficulty hearing?: No Does the patient have difficulty seeing, even when wearing glasses/contacts?: No Does the patient have difficulty concentrating, remembering, or making decisions?: No Does the patient have difficulty walking or climbing stairs?: No Does the patient have difficulty dressing or bathing?: No Does the patient have difficulty doing errands alone such as visiting a doctor's office or shopping?: No    Assessment & Plan  1. Diarrhea, unspecified type  - Gastrointestinal Panel by PCR , Stool - Stool C-Diff Toxin Assay - metroNIDAZOLE (FLAGYL) 500 MG tablet; Take 1 tablet (500 mg total) by mouth 3 (three) times daily.  Dispense: 21 tablet; Refill: 0  - occult blood stool   Symptoms started a couple of weeks after she finished a Zpack  Go to [facility] if worsening of symptoms, we will treat with antibiotics for now and get stool studies as above

## 2016-11-18 LAB — GASTROINTESTINAL PATHOGEN PANEL PCR
C. difficile Tox A/B, PCR: NOT DETECTED
Campylobacter, PCR: NOT DETECTED
Cryptosporidium, PCR: NOT DETECTED
E COLI (ETEC) LT/ST, PCR: NOT DETECTED
E COLI (STEC) STX1/STX2, PCR: NOT DETECTED
E coli 0157, PCR: NOT DETECTED
Giardia lamblia, PCR: NOT DETECTED
NOROVIRUS, PCR: NOT DETECTED
Rotavirus A, PCR: NOT DETECTED
SALMONELLA, PCR: NOT DETECTED
SHIGELLA, PCR: NOT DETECTED

## 2016-11-18 LAB — C. DIFFICILE GDH AND TOXIN A/B
C. difficile GDH: NOT DETECTED
C. difficile Toxin A/B: NOT DETECTED

## 2016-11-23 ENCOUNTER — Other Ambulatory Visit: Payer: Self-pay | Admitting: Family Medicine

## 2016-11-23 DIAGNOSIS — R197 Diarrhea, unspecified: Secondary | ICD-10-CM

## 2016-11-23 LAB — POC HEMOCCULT BLD/STL (HOME/3-CARD/SCREEN)
Card #2 Fecal Occult Blod, POC: NEGATIVE
FECAL OCCULT BLD: NEGATIVE
Fecal Occult Blood, POC: NEGATIVE

## 2016-11-23 LAB — FECAL OCCULT BLOOD, GUAIAC: FECAL OCCULT BLD: NEGATIVE

## 2016-12-21 ENCOUNTER — Ambulatory Visit: Payer: Medicare Other | Admitting: Family Medicine

## 2017-01-17 ENCOUNTER — Ambulatory Visit: Payer: Medicare Other | Admitting: Family Medicine

## 2017-03-02 ENCOUNTER — Ambulatory Visit
Admission: RE | Admit: 2017-03-02 | Discharge: 2017-03-02 | Disposition: A | Discharge status: Home / Self Care | Payer: Medicare Other | Source: Ambulatory Visit | Attending: Family Medicine | Admitting: Family Medicine

## 2017-03-02 ENCOUNTER — Ambulatory Visit (INDEPENDENT_AMBULATORY_CARE_PROVIDER_SITE_OTHER): Payer: Medicare Other | Admitting: Family Medicine

## 2017-03-02 ENCOUNTER — Encounter: Payer: Self-pay | Admitting: Family Medicine

## 2017-03-02 VITALS — BP 136/82 | HR 96 | Temp 97.4°F | Resp 18 | Ht 65.0 in | Wt 263.8 lb

## 2017-03-02 DIAGNOSIS — J3089 Other allergic rhinitis: Secondary | ICD-10-CM | POA: Diagnosis not present

## 2017-03-02 DIAGNOSIS — J302 Other seasonal allergic rhinitis: Secondary | ICD-10-CM | POA: Diagnosis not present

## 2017-03-02 DIAGNOSIS — I700 Atherosclerosis of aorta: Secondary | ICD-10-CM | POA: Diagnosis not present

## 2017-03-02 DIAGNOSIS — E785 Hyperlipidemia, unspecified: Secondary | ICD-10-CM | POA: Diagnosis not present

## 2017-03-02 DIAGNOSIS — M170 Bilateral primary osteoarthritis of knee: Secondary | ICD-10-CM

## 2017-03-02 DIAGNOSIS — E8881 Metabolic syndrome: Secondary | ICD-10-CM

## 2017-03-02 DIAGNOSIS — R059 Cough, unspecified: Secondary | ICD-10-CM

## 2017-03-02 DIAGNOSIS — E668 Other obesity: Secondary | ICD-10-CM | POA: Diagnosis not present

## 2017-03-02 DIAGNOSIS — J9801 Acute bronchospasm: Secondary | ICD-10-CM | POA: Diagnosis not present

## 2017-03-02 DIAGNOSIS — R799 Abnormal finding of blood chemistry, unspecified: Secondary | ICD-10-CM | POA: Diagnosis not present

## 2017-03-02 DIAGNOSIS — I10 Essential (primary) hypertension: Secondary | ICD-10-CM | POA: Diagnosis not present

## 2017-03-02 DIAGNOSIS — R05 Cough: Secondary | ICD-10-CM | POA: Insufficient documentation

## 2017-03-02 DIAGNOSIS — I517 Cardiomegaly: Secondary | ICD-10-CM | POA: Insufficient documentation

## 2017-03-02 LAB — COMPLETE METABOLIC PANEL WITH GFR
ALBUMIN: 4 g/dL (ref 3.6–5.1)
ALK PHOS: 99 U/L (ref 33–130)
ALT: 30 U/L — ABNORMAL HIGH (ref 6–29)
AST: 25 U/L (ref 10–35)
BILIRUBIN TOTAL: 0.7 mg/dL (ref 0.2–1.2)
BUN: 13 mg/dL (ref 7–25)
CALCIUM: 9.5 mg/dL (ref 8.6–10.4)
CO2: 23 mmol/L (ref 20–31)
Chloride: 110 mmol/L (ref 98–110)
Creat: 0.91 mg/dL (ref 0.60–0.93)
GFR, EST NON AFRICAN AMERICAN: 61 mL/min (ref >=60)
GFR, Est African American: 71 mL/min (ref >=60)
GLUCOSE: 87 mg/dL (ref 65–99)
POTASSIUM: 4.2 mmol/L (ref 3.5–5.3)
Sodium: 144 mmol/L (ref 135–146)
TOTAL PROTEIN: 6.7 g/dL (ref 6.1–8.1)

## 2017-03-02 LAB — CBC WITH DIFFERENTIAL/PLATELET
Basophils Absolute: 0 cells/uL (ref 0–200)
Basophils Relative: 0 %
EOS ABS: 200 {cells}/uL (ref 15–500)
Eosinophils Relative: 4 %
HEMATOCRIT: 33.2 % — AB (ref 35.0–45.0)
Hemoglobin: 10.9 g/dL — ABNORMAL LOW (ref 11.7–15.5)
LYMPHS PCT: 36 %
Lymphs Abs: 1800 cells/uL (ref 850–3900)
MCH: 30.2 pg (ref 27.0–33.0)
MCHC: 32.8 g/dL (ref 32.0–36.0)
MCV: 92 fL (ref 80.0–100.0)
MONO ABS: 300 {cells}/uL (ref 200–950)
MONOS PCT: 6 %
MPV: 9.4 fL (ref 7.5–12.5)
Neutro Abs: 2700 cells/uL (ref 1500–7800)
Neutrophils Relative %: 54 %
PLATELETS: 271 10*3/uL (ref 140–400)
RBC: 3.61 MIL/uL — ABNORMAL LOW (ref 3.80–5.10)
RDW: 13.6 % (ref 11.0–15.0)
WBC: 5 10*3/uL (ref 3.8–10.8)

## 2017-03-02 LAB — LIPID PANEL
CHOL/HDL RATIO: 4.3 ratio (ref <5.0)
CHOLESTEROL: 173 mg/dL (ref <200)
HDL: 40 mg/dL — ABNORMAL LOW (ref >50)
LDL Cholesterol: 111 mg/dL — ABNORMAL HIGH (ref <100)
Triglycerides: 108 mg/dL (ref <150)
VLDL: 22 mg/dL (ref <30)

## 2017-03-02 MED ORDER — LISINOPRIL-HYDROCHLOROTHIAZIDE 20-12.5 MG PO TABS: 1.0000 | ORAL_TABLET | Freq: Every day | ORAL | 0 refills | Status: DC

## 2017-03-02 MED ORDER — BENZONATATE 100 MG PO CAPS: 100.0000 mg | ORAL_CAPSULE | Freq: Two times a day (BID) | ORAL | 0 refills | Status: DC | PRN

## 2017-03-02 MED ORDER — BUDESONIDE-FORMOTEROL FUMARATE 160-4.5 MCG/ACT IN AERO: 2.0000 | INHALATION_SPRAY | Freq: Every day | RESPIRATORY_TRACT | 12 refills | Status: DC

## 2017-03-02 MED ORDER — LEVOCETIRIZINE DIHYDROCHLORIDE 5 MG PO TABS: 5.0000 mg | ORAL_TABLET | Freq: Every evening | ORAL | 0 refills | Status: DC

## 2017-03-02 NOTE — Patient Instructions (Signed)
[place] on [address] - Chest Xray today.

## 2017-03-02 NOTE — Progress Notes (Signed)
Name: Kimberly Henry   MRN: [id number]    DOB: 1941-08-05   Date:03/02/2017       Progress Note  Subjective  Chief Complaint  Chief Complaint  Patient presents with  . Medication Refill    6 month F/U  . Hypertension    Denies any symptoms  . Allergic Rhinitis     Has been worst lately  . Osteoarthritis    Unchanged  . Cough    Has been coughing at night, chest congestion, short of breath, wheezing when she is walking.    HPI  HTN: she has been taking medication daily, but has been out for a couple of days. She denies side effects of medication. No chest pain or palpitation, denies orthopnea, no swelling. Checks BP at home and it usually runs 120-130's/80's. Takes Prinzide 20-12.5.  Cough: Some wheezing and mild shortness of breath with cough over the last 2-3 days. No fevers or chills, no chest pain or swelling, no orthopnea, no fevers/chills. 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 Pain has been better controlled, last steroid injection Dec 2016, she has mild aching sensation, no instability. She states it feels stiff.  She stopped Aleve and is taking Tylenol and says Diclofenac gel doesn't work so she doesn't use it. She still works full time at [employer], activity makes it worse, better with rest. Pain free when at rest.  Declines PT or referral to Ortho for repeat injection.  Perennial allergic rhinitis: she has been taking Xyzal daily and also nasal steroid prn, she states currently no nasal congestion, sneezing, post-nasal drainage; usually flares Fall and Spring  Obesity: she has decreased portion size and has lost 1lb since last visit.  She says she has been working a lot and has had trouble eating healthy. She wants to increase her walking, she has cut back some on sweets.  Dyslipidemia: she states Lovastatin caused muscle pain but never tried other statins, we will recheck labs today.  She was supposed to have labs drawn in October 2017, but did not have them done.  Takes 1 aspirin daily.  PT had oatmeal this morning, but wants to have labs drawn today.  Patient Active Problem List   Diagnosis Date Noted  . Perennial allergic rhinitis with seasonal variation 05/30/2015  . 1st degree AV block 03/25/2015  . Extreme obesity 03/25/2015  . Metabolic syndrome 00/37/0488  . History of cervical cancer 03/03/2015  . Hypertension, benign 03/03/2015  . Osteoarthritis, knee 03/03/2015  . Dyslipidemia 03/03/2015  . Failed, ovarian, postablative 11/21/2007    Past Surgical History:  Procedure Laterality Date  . ABDOMINAL HYSTERECTOMY  1996  . CATARACT EXTRACTION Left 2008    Family History  Problem Relation Age of Onset  . Hypertension Mother   . Congestive Heart Failure Mother   . Congestive Heart Failure Father     Social History   Social History  . Marital status: Widowed    Spouse name: N/A  . Number of children: N/A  . Years of education: N/A   Occupational History  . Not on file.   Social History Main Topics  . Smoking status: Former Smoker    Years: 10.00    Types: Cigarettes    Start date: 09/14/1975    Quit date: 06/22/1986  . Smokeless tobacco: Never Used  . Alcohol use No  . Drug use: No  .  Sexual activity: No   Other Topics Concern  . Not on file   Social History Narrative  . No narrative on file     Current Outpatient Prescriptions:  .  acetaminophen (TYLENOL) 500 MG chewable tablet, Chew 500 mg by mouth every 6 (six) hours as needed for pain., Disp: , Rfl:  .  aspirin EC 81 MG tablet, Take 81 mg by mouth daily., Disp: , Rfl:  .  diclofenac sodium (VOLTAREN) 1 % GEL, Apply 4 g topically 4 (four) times daily., Disp: 100 g, Rfl: 1 .  fluticasone (FLONASE) 50 MCG/ACT nasal spray, USE 2 SPRAYS IN EACH  NOSTRIL DAILY, Disp: 48 g, Rfl: 0 .  levocetirizine (XYZAL) 5 MG tablet, TAKE 1 TABLET BY MOUTH  EVERY EVENING, Disp: 90 tablet, Rfl: 0 .   lisinopril-hydrochlorothiazide (PRINZIDE,ZESTORETIC) 20-12.5 MG tablet, TAKE 1 TABLET BY MOUTH  DAILY, Disp: 90 tablet, Rfl: 0 .  metroNIDAZOLE (FLAGYL) 500 MG tablet, Take 1 tablet (500 mg total) by mouth 3 (three) times daily. (Patient not taking: Reported on 03/02/2017), Disp: 21 tablet, Rfl: 0  Allergies  Allergen Reactions  . Lovastatin     Other reaction(s): Muscle Pain     ROS  Constitutional: Negative for fever or weight change.  Respiratory: Negative for cough and shortness of breath.   Cardiovascular: Negative for chest pain or palpitations.  Gastrointestinal: Negative for abdominal pain, no bowel changes.  Musculoskeletal: Negative for gait problem or joint swelling.  Skin: Negative for rash.  Neurological: Negative for dizziness or headache.  No other specific complaints in a complete review of systems (except as listed in HPI above).  Objective  Vitals:   03/02/17 1133  BP: 136/82  Pulse: 96  Resp: 18  Temp: 97.4 F (36.3 C)  TempSrc: Oral  SpO2: 97%  Weight: 263 lb 12.8 oz (119.7 kg)  Height: 5\' 5"  (1.651 m)    Body mass index is 43.9 kg/m.  Physical Exam Constitutional: Patient appears well-developed and well-nourished. Obese No distress.  HEENT: head atraumatic, normocephalic, pupils equal and reactive to light, TM's clear, neck supple, throat within normal limits Cardiovascular: Normal rate, regular rhythm and normal heart sounds.  No murmur heard. No BLE edema. Pulmonary/Chest: Effort normal and breath sounds normal. No respiratory distress. Abdominal: Soft.  There is no tenderness. Psychiatric: Patient has a normal mood and affect. behavior is normal. Judgment and thought content normal. Neuro: No CN deficit.  No results found for this or any previous visit (from the past 2160 hour(s)).  PHQ2/9: Depression screen [facility] 2/9 03/02/2017 11/16/2016 06/22/2016 08/15/2015 05/30/2015  Decreased Interest 0 0 0 0 0  Down, Depressed, Hopeless 0 0 0 0 0  PHQ - 2  Score 0 0 0 0 0    Fall Risk: Fall Risk  03/02/2017 11/16/2016 06/22/2016 08/15/2015 05/30/2015  Falls in the past year? No No No No Yes  Number falls in past yr: - - - - 1  Injury with Fall? - - - - No   Functional Status Survey: Is the patient deaf or have difficulty hearing?: No Does the patient have difficulty seeing, even when wearing glasses/contacts?: No Does the patient have difficulty concentrating, remembering, or making decisions?: No Does the patient have difficulty walking or climbing stairs?: No Does the patient have difficulty dressing or bathing?: No Does the patient have difficulty doing errands alone such as visiting a doctor's office or shopping?: No   Assessment & Plan  1. Hypertension, benign - lisinopril-hydrochlorothiazide (PRINZIDE,ZESTORETIC) 20-12.5 MG  tablet; Take 1 tablet by mouth daily.  Dispense: 90 tablet; Refill: 0 - COMPLETE METABOLIC PANEL WITH GFR  2. Perennial allergic rhinitis with seasonal variation - levocetirizine (XYZAL) 5 MG tablet; Take 1 tablet (5 mg total) by mouth every evening.  Dispense: 90 tablet; Refill: 0  3. Cough  - benzonatate (TESSALON) 100 MG capsule; Take 1 capsule (100 mg total) by mouth 2 (two) times daily as needed for cough.  Dispense: 20 capsule; Refill: 0 - budesonide-formoterol (SYMBICORT) 160-4.5 MCG/ACT inhaler; Inhale 2 puffs into the lungs daily.  Dispense: 1 Inhaler; Refill: 12 - CBC w/Diff/Platelet - DG Chest 2 View; Future We discussed differential diagnosis: pneumonia, bronchitis, allergies, CHF or even ace induced cough, we will check CXR and order antibiotics if needed.   4. Bronchospasm - budesonide-formoterol (SYMBICORT) 160-4.5 MCG/ACT inhaler; Inhale 2 puffs into the lungs daily.  Dispense: 1 Inhaler; Refill: 12 - DG Chest 2 View; Future  5. Dyslipidemia - Lipid panel  6. Extreme obesity - Hemoglobin A1c - Lipid panel  7. Metabolic syndrome - Hemoglobin A1c - Lipid panel  8. Primary  osteoarthritis of both knees Tylenol PRN, declines PT or Ortho referral. Advised we will check Kidney function today, if WNL, we will send 800mg  Ibuprofen for pt to use PRN. Discussed taking this medication with food and only on an as needed basis, pt is agreeable.  Examined and saw the patient with nurse practioner Raelyn Ensign

## 2017-03-03 ENCOUNTER — Telehealth: Payer: Self-pay | Admitting: Family Medicine

## 2017-03-03 LAB — HEMOGLOBIN A1C
Hgb A1c MFr Bld: 5.1 % (ref <5.7)
Mean Plasma Glucose: 100 mg/dL

## 2017-03-03 NOTE — Telephone Encounter (Signed)
Dr. Ancil Boozer states from her Chest X Ray it showed signs of her pasting smoking and the inhaler should help with her symptoms. Due to the chest x ray showing enlarged heart she would like the patient to do a echocardiogram.

## 2017-03-03 NOTE — Telephone Encounter (Signed)
Pt checking on xray test results, please return call on cell [phone]

## 2017-03-04 ENCOUNTER — Other Ambulatory Visit: Payer: Self-pay | Admitting: Family Medicine

## 2017-03-04 ENCOUNTER — Telehealth: Payer: Self-pay | Admitting: Family Medicine

## 2017-03-04 ENCOUNTER — Telehealth: Payer: Self-pay

## 2017-03-04 DIAGNOSIS — I517 Cardiomegaly: Secondary | ICD-10-CM | POA: Insufficient documentation

## 2017-03-04 DIAGNOSIS — R9389 Abnormal findings on diagnostic imaging of other specified body structures: Secondary | ICD-10-CM

## 2017-03-04 MED ORDER — AMOXICILLIN-POT CLAVULANATE 875-125 MG PO TABS: 1.0000 | ORAL_TABLET | Freq: Two times a day (BID) | ORAL | 0 refills | Status: DC

## 2017-03-04 NOTE — Telephone Encounter (Signed)
Pt is asking for her antibiotic to be sent to [facility] on [address]

## 2017-03-04 NOTE — Telephone Encounter (Signed)
I contact  this patient to inform her that she has been scheduled to have her CT of the Chest on Wednesday, March 09, 2017 at 2:30pm at the [facility]. She was also given the number to scheduling [phone]) in case she needed to cancel or reschedule.

## 2017-03-08 ENCOUNTER — Other Ambulatory Visit: Payer: Self-pay | Admitting: Family Medicine

## 2017-03-08 DIAGNOSIS — D649 Anemia, unspecified: Secondary | ICD-10-CM | POA: Insufficient documentation

## 2017-03-08 NOTE — Progress Notes (Unsigned)
Add-on labs

## 2017-03-09 ENCOUNTER — Ambulatory Visit
Admission: RE | Admit: 2017-03-09 | Discharge: 2017-03-09 | Disposition: A | Discharge status: Home / Self Care | Payer: Medicare Other | Source: Ambulatory Visit | Attending: Family Medicine | Admitting: Family Medicine

## 2017-03-09 DIAGNOSIS — R911 Solitary pulmonary nodule: Secondary | ICD-10-CM | POA: Diagnosis not present

## 2017-03-09 DIAGNOSIS — R938 Abnormal findings on diagnostic imaging of other specified body structures: Secondary | ICD-10-CM | POA: Diagnosis present

## 2017-03-09 DIAGNOSIS — I700 Atherosclerosis of aorta: Secondary | ICD-10-CM | POA: Diagnosis not present

## 2017-03-09 DIAGNOSIS — R9389 Abnormal findings on diagnostic imaging of other specified body structures: Secondary | ICD-10-CM

## 2017-03-09 DIAGNOSIS — J984 Other disorders of lung: Secondary | ICD-10-CM | POA: Insufficient documentation

## 2017-03-09 DIAGNOSIS — I2510 Atherosclerotic heart disease of native coronary artery without angina pectoris: Secondary | ICD-10-CM | POA: Insufficient documentation

## 2017-03-09 DIAGNOSIS — I517 Cardiomegaly: Secondary | ICD-10-CM | POA: Diagnosis not present

## 2017-03-10 ENCOUNTER — Encounter: Payer: Self-pay | Admitting: Family Medicine

## 2017-03-10 ENCOUNTER — Telehealth: Payer: Self-pay

## 2017-03-10 DIAGNOSIS — I700 Atherosclerosis of aorta: Secondary | ICD-10-CM | POA: Insufficient documentation

## 2017-03-10 NOTE — Telephone Encounter (Signed)
-----   Message from Steele Sizer, MD sent at 03/08/2017  5:10 PM EDT ----- Normal hgbA1C Normal fasting glucose and kidney function, one of the liver enzymes is only 1 point above normal - not to worry at this time Lipid panel shows low HDL : to improve HDL patient  needs to eat tree nuts ( pecans/pistachios/almonds ) four times weekly, eat fish two times weekly  and exercise  at least 150 minutes per week She is anemic, please add iron studies.

## 2017-03-10 NOTE — Telephone Encounter (Signed)
Called to inform patient about her blood work and vm was full. Also added on Ferritin and TIBC to existing blood work.

## 2017-03-14 ENCOUNTER — Telehealth: Payer: Self-pay | Admitting: Family Medicine

## 2017-03-14 ENCOUNTER — Other Ambulatory Visit: Payer: Self-pay

## 2017-03-14 DIAGNOSIS — I700 Atherosclerosis of aorta: Secondary | ICD-10-CM

## 2017-03-14 DIAGNOSIS — R911 Solitary pulmonary nodule: Secondary | ICD-10-CM

## 2017-03-14 DIAGNOSIS — D649 Anemia, unspecified: Secondary | ICD-10-CM

## 2017-03-14 NOTE — Telephone Encounter (Signed)
Pt states she would like a call back concerning her COPD.

## 2017-03-14 NOTE — Telephone Encounter (Signed)
Patient wanted to ask about her imaging and Dr. Ancil Boozer recommended patient to go to cardiology and start cholesterol medication. Patient notified to come back in for labs since we were unable to add her iron studies and she is tired all the time.

## 2017-03-15 ENCOUNTER — Other Ambulatory Visit: Payer: Self-pay | Admitting: Family Medicine

## 2017-03-15 DIAGNOSIS — I700 Atherosclerosis of aorta: Secondary | ICD-10-CM

## 2017-03-15 MED ORDER — ATORVASTATIN CALCIUM 40 MG PO TABS: 40.0000 mg | ORAL_TABLET | Freq: Every day | ORAL | 3 refills | Status: DC

## 2017-03-15 NOTE — Progress Notes (Unsigned)
She has sensitivity to Lovastatin, we will try Atorvastatin

## 2017-03-17 NOTE — Telephone Encounter (Signed)
Please close encounter. Thank you.

## 2017-03-23 ENCOUNTER — Other Ambulatory Visit: Payer: Self-pay | Admitting: Family Medicine

## 2017-03-23 DIAGNOSIS — R059 Cough, unspecified: Secondary | ICD-10-CM

## 2017-03-23 DIAGNOSIS — R05 Cough: Secondary | ICD-10-CM

## 2017-03-26 ENCOUNTER — Other Ambulatory Visit: Payer: Self-pay | Admitting: Family Medicine

## 2017-03-26 DIAGNOSIS — R05 Cough: Secondary | ICD-10-CM

## 2017-03-26 DIAGNOSIS — R059 Cough, unspecified: Secondary | ICD-10-CM

## 2017-03-29 ENCOUNTER — Other Ambulatory Visit: Payer: Self-pay | Admitting: Family Medicine

## 2017-03-29 DIAGNOSIS — R059 Cough, unspecified: Secondary | ICD-10-CM

## 2017-03-29 DIAGNOSIS — R05 Cough: Secondary | ICD-10-CM

## 2017-03-29 NOTE — Telephone Encounter (Signed)
Patient requesting refill of Tessalon to [facility].

## 2017-03-30 ENCOUNTER — Other Ambulatory Visit: Payer: Self-pay | Admitting: Family Medicine

## 2017-03-31 LAB — IRON,TIBC AND FERRITIN PANEL
%SAT: 11 % (ref 11–50)
Ferritin: 122 ng/mL (ref 20–288)
Iron: 41 ug/dL — ABNORMAL LOW (ref 45–160)
TIBC: 379 ug/dL (ref 250–450)

## 2017-04-15 ENCOUNTER — Telehealth: Payer: Self-pay

## 2017-04-15 ENCOUNTER — Emergency Department: Payer: Medicare Other

## 2017-04-15 ENCOUNTER — Emergency Department
Admission: EM | Admit: 2017-04-15 | Discharge: 2017-04-15 | Disposition: A | Discharge status: Home / Self Care | Payer: Medicare Other | Attending: Emergency Medicine | Admitting: Emergency Medicine

## 2017-04-15 ENCOUNTER — Encounter: Payer: Self-pay | Admitting: Intensive Care

## 2017-04-15 DIAGNOSIS — I509 Heart failure, unspecified: Secondary | ICD-10-CM | POA: Diagnosis not present

## 2017-04-15 DIAGNOSIS — R0602 Shortness of breath: Secondary | ICD-10-CM | POA: Diagnosis present

## 2017-04-15 DIAGNOSIS — Z87891 Personal history of nicotine dependence: Secondary | ICD-10-CM | POA: Insufficient documentation

## 2017-04-15 DIAGNOSIS — J449 Chronic obstructive pulmonary disease, unspecified: Secondary | ICD-10-CM | POA: Diagnosis not present

## 2017-04-15 DIAGNOSIS — Z7982 Long term (current) use of aspirin: Secondary | ICD-10-CM | POA: Diagnosis not present

## 2017-04-15 DIAGNOSIS — Z79899 Other long term (current) drug therapy: Secondary | ICD-10-CM | POA: Diagnosis not present

## 2017-04-15 DIAGNOSIS — I10 Essential (primary) hypertension: Secondary | ICD-10-CM | POA: Diagnosis not present

## 2017-04-15 HISTORY — DX: Chronic obstructive pulmonary disease, unspecified: J44.9

## 2017-04-15 HISTORY — DX: Heart failure, unspecified: I50.9

## 2017-04-15 LAB — BASIC METABOLIC PANEL
ANION GAP: 11 (ref 5–15)
BUN: 18 mg/dL (ref 6–20)
CALCIUM: 9.6 mg/dL (ref 8.9–10.3)
CHLORIDE: 105 mmol/L (ref 101–111)
CO2: 27 mmol/L (ref 22–32)
CREATININE: 1.13 mg/dL — AB (ref 0.44–1.00)
GFR calc Af Amer: 53 mL/min — ABNORMAL LOW (ref >60)
GFR, EST NON AFRICAN AMERICAN: 46 mL/min — AB (ref >60)
GLUCOSE: 159 mg/dL — AB (ref 65–99)
Potassium: 3.2 mmol/L — ABNORMAL LOW (ref 3.5–5.1)
Sodium: 143 mmol/L (ref 135–145)

## 2017-04-15 LAB — CBC
HCT: 33.9 % — ABNORMAL LOW (ref 35.0–47.0)
HEMOGLOBIN: 11.4 g/dL — AB (ref 12.0–16.0)
MCH: 30.5 pg (ref 26.0–34.0)
MCHC: 33.5 g/dL (ref 32.0–36.0)
MCV: 90.9 fL (ref 80.0–100.0)
Platelets: 261 10*3/uL (ref 150–440)
RBC: 3.73 MIL/uL — AB (ref 3.80–5.20)
RDW: 14.4 % (ref 11.5–14.5)
WBC: 5.8 10*3/uL (ref 3.6–11.0)

## 2017-04-15 LAB — TROPONIN I
TROPONIN I: 0.04 ng/mL — AB (ref <0.03)
Troponin I: 0.04 ng/mL (ref <0.03)

## 2017-04-15 LAB — BRAIN NATRIURETIC PEPTIDE: B NATRIURETIC PEPTIDE 5: 738 pg/mL — AB (ref 0.0–100.0)

## 2017-04-15 MED ORDER — FUROSEMIDE 40 MG PO TABS: 40.0000 mg | ORAL_TABLET | Freq: Every day | ORAL | 0 refills | Status: DC

## 2017-04-15 MED ORDER — FUROSEMIDE 10 MG/ML IJ SOLN
40.0000 mg | Freq: Once | INTRAMUSCULAR | Status: AC
  Administered 2017-04-15: 40 mg via INTRAVENOUS
  Filled 2017-04-15: qty 4

## 2017-04-15 MED ORDER — POTASSIUM CHLORIDE CRYS ER 20 MEQ PO TBCR
40.0000 meq | EXTENDED_RELEASE_TABLET | Freq: Once | ORAL | Status: AC
  Administered 2017-04-15: 40 meq via ORAL
  Filled 2017-04-15: qty 2

## 2017-04-15 MED ORDER — POTASSIUM CHLORIDE ER 10 MEQ PO TBCR: 20.0000 meq | EXTENDED_RELEASE_TABLET | Freq: Every day | ORAL | 0 refills | Status: DC

## 2017-04-15 NOTE — Telephone Encounter (Signed)
Left msg to schedule echocardiogram

## 2017-04-15 NOTE — ED Notes (Signed)
Pt discharged to home.  Family member driving.  Discharge instructions reviewed.  Verbalized understanding.  No questions or concerns at this time.  Teach back verified.  Pt in NAD.  No items left in ED.   

## 2017-04-15 NOTE — ED Notes (Signed)
md veronese aware of trop 0.04

## 2017-04-15 NOTE — Telephone Encounter (Signed)
-----   Message from Minna Merritts, MD sent at 04/15/2017  2:30 PM EDT ----- Regarding: CHF Patient seen in the emergency room Friday for shortness of breath possible CHF,  Was given Lasix in the ER and Lasix daily at discharge We'll need follow-up in clinic hopefully early next week  Can we find out where her echo is  it is not in the system? TG

## 2017-04-15 NOTE — ED Provider Notes (Signed)
[facility] Emergency Department Provider Note  ____________________________________________   First MD Initiated Contact with Patient 04/15/17 1402     (approximate)  I have reviewed the triage vital signs and the nursing notes.   HISTORY  Chief Complaint Chest Pain and Shortness of Breath   HPI Kimberly Henry is a 76 y.o. female Kimberly Henry is a 76 y.o. female with a history of hypertension who is presenting to the emergency department today with shortness of breath and chest pressure that has been worsening over the past month. She says that the chest pressure started after the shortness of breath and has only been over the past several weeks. She describes the chest pressure as a 5-6 out of 10 at this time and across the front of her chest. It is nonradiating and does not go into her neck, back or arms. She says that she is also noticed increased swelling to her bilateral lower extremities as well as difficulty sleeping as she is unable to lay flat without having increased shortness of breath. She said that she has been having outpatient workup with a primary care doctor who had her go for an echocardiogram which showed CHF. She is supposed to follow up with cardiology but does not have an appointment until August 14. Because her symptoms worsen she is presented to the emergency department today for further evaluation.   Past Medical History:  Diagnosis Date  . Acid phosphatase elevated   . AV block, 1st degree   . CHF (congestive heart failure) ([place])   . COPD (chronic obstructive pulmonary disease) ([place])   . Hx of cervical malignancy   . Hyperlipidemia   . Hypertension   . Metabolic syndrome   . Obesity, Class III, BMI 40-49.9 (morbid obesity) ([place])   . Osteoarthritis of both knees     Patient Active Problem List   Diagnosis Date Noted  . Aortic atherosclerosis ([place]) 03/10/2017  . Anemia, unspecified 03/08/2017  . Cardiomegaly 03/04/2017  .  Abnormal CXR 03/04/2017  . Perennial allergic rhinitis with seasonal variation 05/30/2015  . 1st degree AV block 03/25/2015  . Extreme obesity 03/25/2015  . Metabolic syndrome 74/04/1447  . History of cervical cancer 03/03/2015  . Hypertension, benign 03/03/2015  . Osteoarthritis, knee 03/03/2015  . Dyslipidemia 03/03/2015  . Failed, ovarian, postablative 11/21/2007    Past Surgical History:  Procedure Laterality Date  . ABDOMINAL HYSTERECTOMY  1996  . CATARACT EXTRACTION Left 2008    Prior to Admission medications   Medication Sig Start Date End Date Taking? Authorizing Provider  aspirin EC 81 MG tablet Take 81 mg by mouth daily.   Yes [provider]  atorvastatin (LIPITOR) 40 MG tablet Take 1 tablet (40 mg total) by mouth daily. 03/15/17  Yes Sowles, Drue Stager, MD  budesonide-formoterol [facility]) 160-4.5 MCG/ACT inhaler Inhale 2 puffs into the lungs daily. 03/02/17  Yes Hubbard Hartshorn, FNP  fluticasone (FLONASE) 50 MCG/ACT nasal spray USE 2 SPRAYS IN EACH  NOSTRIL DAILY 10/21/16  Yes Sowles, Drue Stager, MD  levocetirizine (XYZAL) 5 MG tablet Take 1 tablet (5 mg total) by mouth every evening. 03/02/17  Yes Hubbard Hartshorn, FNP  lisinopril-hydrochlorothiazide (PRINZIDE,ZESTORETIC) 20-12.5 MG tablet Take 1 tablet by mouth daily. 03/02/17  Yes Hubbard Hartshorn, FNP  acetaminophen (TYLENOL) 500 MG chewable tablet Chew 500 mg by mouth every 6 (six) hours as needed for pain.    [provider]  amoxicillin-clavulanate (AUGMENTIN) 875-125 MG tablet Take 1 tablet by  mouth 2 (two) times daily. Patient not taking: Reported on 04/15/2017 03/04/17   Steele Sizer, MD  benzonatate (TESSALON) 100 MG capsule Take 1 capsule (100 mg total) by mouth 2 (two) times daily as needed for cough. Patient not taking: Reported on 04/15/2017 03/02/17   Hubbard Hartshorn, FNP    Allergies Lovastatin  Family History  Problem Relation Age of Onset  . Hypertension Mother   . Congestive Heart Failure Mother     . Congestive Heart Failure Father     Social History Social History  Substance Use Topics  . Smoking status: Former Smoker    Years: 10.00    Types: Cigarettes    Start date: 09/14/1975    Quit date: 06/22/1986  . Smokeless tobacco: Never Used  . Alcohol use No    Review of Systems  Constitutional: No fever/chills Eyes: No visual changes. ENT: No sore throat. Cardiovascular: as above Respiratory: as above Gastrointestinal: No abdominal pain.  No nausea, no vomiting.  No diarrhea.  No constipation. Genitourinary: Negative for dysuria. Musculoskeletal: Negative for back pain. Skin: Negative for rash. Neurological: Negative for headaches, focal weakness or numbness.   ____________________________________________   PHYSICAL EXAM:  VITAL SIGNS: ED Triage Vitals  Enc Vitals Group     BP 04/15/17 1252 139/69     Pulse Rate 04/15/17 1252 90     Resp 04/15/17 1252 20     Temp 04/15/17 1252 98.7 F (37.1 C)     Temp Source 04/15/17 1252 Oral     SpO2 04/15/17 1252 98 %     Weight 04/15/17 1250 265 lb (120.2 kg)     Height 04/15/17 1250 5\' 6"  (1.676 m)     Head Circumference --      Peak Flow --      Pain Score 04/15/17 1250 5     Pain Loc --      Pain Edu? --      Excl. in [place]? --     Constitutional: Alert and oriented. Well appearing and in no acute distress. Eyes: Conjunctivae are normal.  Head: Atraumatic. Nose: No congestion/rhinnorhea. Mouth/Throat: Mucous membranes are moist.  Neck: No stridor.   Cardiovascular: Normal rate, regular rhythm. Grossly normal heart sounds.   Respiratory: Normal respiratory effort.  No retractions. Mild rales to the bilateral mid to lower fields. Patient without any respiratory distress and speaks in full sentences. Gastrointestinal: Soft and nontender. No distention. No CVA tenderness. Musculoskeletal: Moderate and equal bilateral lower extremity edema to the bilateral ankles and calves. Neurologic:  Normal speech and language.  No gross focal neurologic deficits are appreciated. Skin:  Skin is warm, dry and intact. No rash noted. Psychiatric: Mood and affect are normal. Speech and behavior are normal.  ____________________________________________   LABS (all labs ordered are listed, but only abnormal results are displayed)  Labs Reviewed  BASIC METABOLIC PANEL - Abnormal; Notable for the following:       Result Value   Potassium 3.2 (*)    Glucose, Bld 159 (*)    Creatinine, Ser 1.13 (*)    GFR calc non Af Amer 46 (*)    GFR calc Af Amer 53 (*)    All other components within normal limits  CBC - Abnormal; Notable for the following:    RBC 3.73 (*)    Hemoglobin 11.4 (*)    HCT 33.9 (*)    All other components within normal limits  TROPONIN I - Abnormal; Notable for the following:  Troponin I 0.04 (*)    All other components within normal limits  TROPONIN I - Abnormal; Notable for the following:    Troponin I 0.04 (*)    All other components within normal limits  BRAIN NATRIURETIC PEPTIDE   ____________________________________________  EKG  ED ECG REPORT I, Shenay Torti,  Youlanda Roys, the attending physician, personally viewed and interpreted this ECG.   Date: 04/15/2017  EKG Time: 1248  Rate: 91  Rhythm: normal sinus rhythm  Axis: Normal  Intervals:left bundle branch block  ST&T Change: ST and T-wave segments are consistent with left bundle branch block. No previous EKG on the record for comparison. Changes are not consistent with STEMI.  ____________________________________________  RADIOLOGY  Cardiomegaly with vascular congestion and interstitial edema. ____________________________________________   PROCEDURES  Procedure(s) performed:   Procedures  Critical Care performed:   ____________________________________________   INITIAL IMPRESSION / ASSESSMENT AND PLAN / ED COURSE  Pertinent labs & imaging results that were available during my care of the patient were reviewed by me  and considered in my medical decision making (see chart for details).  ----------------------------------------- 3:00 PM on 04/15/2017 -----------------------------------------  Patient appears well and although there is no old left bundle branch block for comparison or symptoms have been ongoing for several weeks and she does not appear to be having a STEMI. I discussed the case with Dr. Rockey Situ . We established a plan to try the patient with a dose of Lasix in the emergency department and recheck her troponin. The patient feels well and she is a stable troponin that she'll be discharged with 3 days of Lasix per chills down all her other medications and she will follow up with cardiology.    ----------------------------------------- 5:56 PM on 04/15/2017 -----------------------------------------  Patient feeling back to her baseline. Denying any shortness of breath at this time. Says that she is able to lie flat without any distress. Also denies any chest pain at this time. Has urinated multiple times after receiving Lasix. I splinter the plan for discharge with Lasix as well as potassium. She also knows that she'll be following up with Dr. Rockey Situ early next week. She is understanding of the plan and willing to comply. Will return to the emergency department for any worsening or concerning symptoms.  ____________________________________________   FINAL CLINICAL IMPRESSION(S) / ED DIAGNOSES  CHF exacerbation.    NEW MEDICATIONS STARTED DURING THIS VISIT:  New Prescriptions   No medications on file     Note:  This document was prepared using Dragon voice recognition software and may include unintentional dictation errors.     Orbie Pyo, MD 04/15/17 [id number]

## 2017-04-15 NOTE — ED Triage Notes (Signed)
Pt presents with SOB over a month with chest pain that started today. Central chest pressure with no radiation. Cannot lay flat. A&O x4. HX COPD, HTN

## 2017-04-15 NOTE — ED Notes (Signed)
Signature pad not working at this time.  Pt verbalized understanding of discharge instructions.

## 2017-04-18 ENCOUNTER — Telehealth: Payer: Self-pay

## 2017-04-18 ENCOUNTER — Other Ambulatory Visit
Admission: RE | Admit: 2017-04-18 | Discharge: 2017-04-18 | Disposition: A | Discharge status: Home / Self Care | Payer: Medicare Other | Source: Ambulatory Visit | Attending: Cardiovascular Disease | Admitting: Cardiovascular Disease

## 2017-04-18 ENCOUNTER — Ambulatory Visit (INDEPENDENT_AMBULATORY_CARE_PROVIDER_SITE_OTHER): Payer: Medicare Other | Admitting: Cardiovascular Disease

## 2017-04-18 ENCOUNTER — Encounter: Payer: Self-pay | Admitting: Cardiovascular Disease

## 2017-04-18 ENCOUNTER — Telehealth: Payer: Self-pay | Admitting: Cardiovascular Disease

## 2017-04-18 VITALS — BP 130/80 | HR 85 | Ht 66.0 in | Wt 269.5 lb

## 2017-04-18 DIAGNOSIS — I700 Atherosclerosis of aorta: Secondary | ICD-10-CM

## 2017-04-18 DIAGNOSIS — R0602 Shortness of breath: Secondary | ICD-10-CM

## 2017-04-18 DIAGNOSIS — Z01812 Encounter for preprocedural laboratory examination: Secondary | ICD-10-CM

## 2017-04-18 DIAGNOSIS — R079 Chest pain, unspecified: Secondary | ICD-10-CM | POA: Diagnosis not present

## 2017-04-18 DIAGNOSIS — I509 Heart failure, unspecified: Secondary | ICD-10-CM

## 2017-04-18 LAB — BASIC METABOLIC PANEL
ANION GAP: 12 (ref 5–15)
BUN: 17 mg/dL (ref 6–20)
CO2: 28 mmol/L (ref 22–32)
Calcium: 9.4 mg/dL (ref 8.9–10.3)
Chloride: 105 mmol/L (ref 101–111)
Creatinine, Ser: 0.91 mg/dL (ref 0.44–1.00)
GFR calc Af Amer: >60 mL/min (ref >60)
GFR calc non Af Amer: 60 mL/min — ABNORMAL LOW (ref >60)
GLUCOSE: 97 mg/dL (ref 65–99)
POTASSIUM: 2.9 mmol/L — AB (ref 3.5–5.1)
Sodium: 145 mmol/L (ref 135–145)

## 2017-04-18 LAB — PROTIME-INR
INR: 1.28
PROTHROMBIN TIME: 16.1 s — AB (ref 11.4–15.2)

## 2017-04-18 MED ORDER — FUROSEMIDE 40 MG PO TABS: 40.0000 mg | ORAL_TABLET | Freq: Every day | ORAL | 5 refills | Status: DC

## 2017-04-18 MED ORDER — POTASSIUM CHLORIDE ER 20 MEQ PO TBCR: 20.0000 meq | EXTENDED_RELEASE_TABLET | Freq: Every day | ORAL | 5 refills | Status: DC

## 2017-04-18 NOTE — Telephone Encounter (Signed)
Patient needs 1 m fu  Next available 06/13/17 with Arida Please advise

## 2017-04-18 NOTE — Telephone Encounter (Signed)
I gave her Atorvastatin on 03/15/2017

## 2017-04-18 NOTE — Progress Notes (Signed)
Cardiology Office Note   Date:  04/18/2017   ID:  Kimberly Henry 06-29-1941, MRN [id number]  PCP:  Steele Sizer, MD  Cardiologist:   Kathlyn Sacramento, MD   Chief Complaint  Patient presents with  . other    Ref by Dr. Ancil Boozer for shortness of breath and LE edema. Meds reviewed by the pt. verbally.       History of Present Illness: Kimberly Henry is a 76 y.o. female who was referred for evaluation of Chest pain and shortness of breath with concern for congestive heart failure. She has known history of essential hypertension, obesity, COPD, hyperlipidemia and osteoarthritis.She is a previous smoker but quit many years ago. She has been having progressive symptoms of exertional dyspnea and chest tightness since June which has worsened significantly over the last 2 weeks. This has been associated with orthopnea, PND and leg edema. She had CT scan of the chest done which showed pulmonary vascular congestion, cardiomegaly, aortic and coronary calcifications. Her symptoms worsened and she went to the emergency room last week. BNP was in the 700 range. Troponin was 0.04. She was discharged on 3 doses of furosemide 40 mg daily. She reports no significant improvement in symptoms. She appears to be dyspneic at rest. She is not diabetic. His family history of congestive heart failure. She is not aware of sleep apnea.   Past Medical History:  Diagnosis Date  . Acid phosphatase elevated   . AV block, 1st degree   . CHF (congestive heart failure) ([place])   . COPD (chronic obstructive pulmonary disease) ([place])   . Hx of cervical malignancy   . Hyperlipidemia   . Hypertension   . Metabolic syndrome   . Obesity, Class III, BMI 40-49.9 (morbid obesity) ([place])   . Osteoarthritis of both knees     Past Surgical History:  Procedure Laterality Date  . ABDOMINAL HYSTERECTOMY  1996  . CATARACT EXTRACTION Left 2008     Current Outpatient Prescriptions  Medication Sig Dispense Refill  .  acetaminophen (TYLENOL) 500 MG chewable tablet Chew 500 mg by mouth every 6 (six) hours as needed for pain.    Marland Kitchen aspirin EC 81 MG tablet Take 81 mg by mouth daily.    Marland Kitchen atorvastatin (LIPITOR) 40 MG tablet Take 1 tablet (40 mg total) by mouth daily. 90 tablet 3  . budesonide-formoterol (SYMBICORT) 160-4.5 MCG/ACT inhaler Inhale 2 puffs into the lungs daily. 1 Inhaler 12  . fluticasone (FLONASE) 50 MCG/ACT nasal spray USE 2 SPRAYS IN EACH  NOSTRIL DAILY 48 g 0  . levocetirizine (XYZAL) 5 MG tablet Take 1 tablet (5 mg total) by mouth every evening. 90 tablet 0  . lisinopril-hydrochlorothiazide (PRINZIDE,ZESTORETIC) 20-12.5 MG tablet Take 1 tablet by mouth daily. 90 tablet 0  . potassium chloride (K-DUR) 10 MEQ tablet Take 2 tablets (20 mEq total) by mouth daily. (Patient taking differently: Take 40 mEq by mouth daily. ) 6 tablet 0  . furosemide (LASIX) 40 MG tablet Take 1 tablet (40 mg total) by mouth daily. (Patient not taking: Reported on 04/18/2017) 3 tablet 0   No current facility-administered medications for this visit.     Allergies:   Lovastatin    Social History:  The patient  reports that she quit smoking about 30 years ago. Her smoking use included Cigarettes. She started smoking about 41 years ago. She quit after 10.00 years of use. She has never used smokeless tobacco. She reports that she does not drink alcohol  or use drugs.   Family History:  The patient's family history includes Congestive Heart Failure in her father and mother; Hypertension in her mother.    ROS:  Please see the history of present illness.   Otherwise, review of systems are positive for none.   All other systems are reviewed and negative.    PHYSICAL EXAM: VS:  BP 130/80 (BP Location: Left Arm, Patient Position: Sitting, Cuff Size: Large)   Pulse 85   Ht 5\' 6"  (1.676 m)   Wt 269 lb 8 oz (122.2 kg)   BMI 43.50 kg/m  , BMI Body mass index is 43.5 kg/m. GEN: Well nourished, well developed, in no acute distress   HEENT: normal  Neck: no carotid bruits, or masses. No significant JVD. Cardiac: RRR; no murmurs, rubs, or gallops, +1 edema Respiratory:  Diminished breath sounds at the base, normal work of breathing GI: soft, nontender, nondistended, + BS MS: no deformity or atrophy  Skin: warm and dry, no rash Neuro:  Strength and sensation are intact Psych: euthymic mood, full affect   EKG:  EKG is ordered today. The ekg ordered today demonstrates normal sinus rhythm with left bundle branch block   Recent Labs: 03/02/2017: ALT 30 04/15/2017: B Natriuretic Peptide 738.0; BUN 18; Creatinine, Ser 1.13; Hemoglobin 11.4; Platelets 261; Potassium 3.2; Sodium 143    Lipid Panel    Component Value Date/Time   CHOL 173 03/02/2017 1242   TRIG 108 03/02/2017 1242   HDL 40 (L) 03/02/2017 1242   CHOLHDL 4.3 03/02/2017 1242   VLDL 22 03/02/2017 1242   LDLCALC 111 (H) 03/02/2017 1242      Wt Readings from Last 3 Encounters:  04/18/17 269 lb 8 oz (122.2 kg)  04/15/17 265 lb (120.2 kg)  03/02/17 263 lb 12.8 oz (119.7 kg)       No flowsheet data found.    ASSESSMENT AND PLAN:  1.  Acute heart failure likely systolic dysfunction: The patient's symptoms are very worrisome and have been progressive. She appears to be volume overloaded and I recommend continuing furosemide 40 mg once daily. Given the association with chest tightness and underlying left bundle branch block, most likely she has ischemic cardiomyopathy as an etiology. I recommend proceeding with urgent echocardiogram followed by left heart catheterization and possibly right heart catheterization depending on the results of echocardiogram. I discussed the procedure in details as well as risk and benefits. I instructed her to go to the emergency room for admission if her symptoms worsen.  2. Essential hypertension: Blood pressure is controlled.  3. Hyperlipidemia: Continue atorvastatin.  4. Aortic and coronary atherosclerosis noted on CT  scan   Disposition:   FU with me in 1 month  Signed,  Kathlyn Sacramento, MD  04/18/2017 1:39 PM    [place]

## 2017-04-18 NOTE — Telephone Encounter (Signed)
Patient came in for appt today with Dr Fletcher Anon and refill was sent.

## 2017-04-18 NOTE — Telephone Encounter (Signed)
Pt will f/u w/Chris Sharolyn Douglas in September.

## 2017-04-18 NOTE — Telephone Encounter (Signed)
Pt states she needs some more fluid pills that were given to her in the ED. She is scheduled to see dr Rockey Situ on 8/14. Please call.

## 2017-04-18 NOTE — Telephone Encounter (Signed)
Called and patient states she never received medication. Called [employer] and they shipped out Atorvastatin on 03/16/17 and [employer] confirmed the delivery on March 21, 2017 at 4:20 p.m. At patient's mail box. Called to informed patient but she did not answer.

## 2017-04-18 NOTE — Telephone Encounter (Signed)
Mobile number listed is not patient's number, removed from list.  Patient's home number, no answer, voicemail box is full.

## 2017-04-18 NOTE — Patient Instructions (Addendum)
Medication Instructions:  Your physician has recommended you make the following change in your medication:  INCREASE potassium to 26mEq once daily RESUME lasix 40mg  once daily  Labwork: BMET, PT/INR  Testing/Procedures: Your physician has requested that you have an echocardiogram. Echocardiography is a painless test that uses sound waves to create images of your heart. It provides your doctor with information about the size and shape of your heart and how well your heart's chambers and valves are working. This procedure takes approximately one hour. There are no restrictions for this procedure.  Your physician has requested that you have a cardiac catheterization. Cardiac catheterization is used to diagnose and/or treat various heart conditions. Doctors may recommend this procedure for a number of different reasons. The most common reason is to evaluate chest pain. Chest pain can be a symptom of coronary artery disease (CAD), and cardiac catheterization can show whether plaque is narrowing or blocking your heart's arteries. This procedure is also used to evaluate the valves, as well as measure the blood flow and oxygen levels in different parts of your heart. For further information please visit [employer]uge[employer]iesta[employer]tn. Please follow instruction sheet, as given.  [facility] Cardiac Cath Instructions   You are scheduled for a Cardiac Cath on: Friday, August 10  Please arrive at 6:30am on the day of your procedure  Please expect a call from our [place] to pre-register you  Do not eat/drink anything after midnight  Someone will need to drive you home  It is recommended someone be with you for the first 24 hours after your procedure  Wear clothes that are easy to get on/off and wear slip on shoes if possible   Medications bring a current list of all medications with you    __xx_ Do not take these medications the morning of your procedure:Lasix  Day of your  procedure: Arrive at the [place] entrance.  Free valet service is available.  After entering the [place] please check-in at the registration desk (1st desk on your right) to receive your armband. After receiving your armband someone will escort you to the cardiac cath/special procedures waiting area.  The usual length of stay after your procedure is about 2 to 3 hours.  This can vary.  If you have any questions, please call our office at [phone], or you may call the cardiac cath lab at [facility] directly at [phone]   Follow-Up: Your physician recommends that you schedule a follow-up appointment in: one month with Dr. Fletcher Anon.    Any Other Special Instructions Will Be Listed Below (If Applicable).     If you need a refill on your cardiac medications before your next appointment, please call your pharmacy.   Angiogram An angiogram is an X-ray test. It is used to look at your blood vessels. For this test, a dye is put into the blood vessel being checked. The dye shows up on X-rays. It helps your doctor see if there is a blockage or other problem in the blood vessel. What happens before the procedure?  Follow your doctor's instructions about limiting what you eat or drink.  Ask your doctor if you may drink enough water to take any needed medicines the morning of the test.  Plan to have someone take you home after the test.  If you go home the same day as the test, plan to have someone stay with you for 24 hours. What happens during the procedure?  An IV tube will be put into  one of your veins.  You will be given a medicine that makes you relax (sedative).  Your skin will be washed where the thin tube (catheter) will be put in. Hair may be removed from this area. The tube may be put into: ? Your upper leg area (groin). ? The fold of your arm, near your elbow. ? Your wrist.  You will be given a medicine that numbs the area where the tube will be inserted (local  anesthetic).  The tube will be inserted into a blood vessel.  Using a type of X-ray (fluoroscopy) to see, your doctor will move the tube into the blood vessel to check it.  Dye will be put in through the tube. X-rays of your blood vessels will then be taken. Different doctors and hospitals may do this procedure differently. What happens after the procedure?  If the test is done through the leg, you will be kept in bed lying flat for several hours. You will be told to not bend or cross your legs.  The area where the tube was inserted will be checked often.  The pulse in your feet or wrist will be checked often.  More tests or X-rays may be done. This information is not intended to replace advice given to you by your health care provider. Make sure you discuss any questions you have with your health care provider. Document Released: 11/26/2008 Document Revised: 02/05/2016 Document Reviewed: 01/31/2013 Elsevier Interactive Patient Education  2017 [employer].

## 2017-04-18 NOTE — Telephone Encounter (Signed)
Patient states she is having compliance of SOB so she went to the hospital and the doctor at [facility] was unable to see the recent CT Scan done. Also patient is going to see Dr. Rockey Situ on the 04/26/17 and requesting we sent the CT to his office. I informed her Dr. Rockey Situ should be able to see the CT since they have Epic too but will manually fax it as well. Also on the CT it stated for her to continue aspirin and cholesterol medication, but patient states she does not take Cholesterol medication.

## 2017-04-19 ENCOUNTER — Inpatient Hospital Stay (HOSPITAL_COMMUNITY)
Admit: 2017-04-19 | Discharge: 2017-04-19 | Disposition: A | Discharge status: Home / Self Care | Payer: Medicare Other | Attending: Internal Medicine | Admitting: Internal Medicine

## 2017-04-19 ENCOUNTER — Inpatient Hospital Stay
Admission: EM | Admit: 2017-04-19 | Discharge: 2017-04-23 | DRG: 286 | Disposition: A | Discharge status: Home / Self Care | Payer: Medicare Other | Attending: Internal Medicine | Admitting: Internal Medicine

## 2017-04-19 ENCOUNTER — Other Ambulatory Visit: Payer: Self-pay

## 2017-04-19 ENCOUNTER — Emergency Department: Payer: Medicare Other

## 2017-04-19 ENCOUNTER — Encounter: Payer: Self-pay | Admitting: Emergency Medicine

## 2017-04-19 DIAGNOSIS — Z7982 Long term (current) use of aspirin: Secondary | ICD-10-CM

## 2017-04-19 DIAGNOSIS — I2510 Atherosclerotic heart disease of native coronary artery without angina pectoris: Secondary | ICD-10-CM | POA: Diagnosis present

## 2017-04-19 DIAGNOSIS — R748 Abnormal levels of other serum enzymes: Secondary | ICD-10-CM

## 2017-04-19 DIAGNOSIS — Z6841 Body Mass Index (BMI) 40.0 and over, adult: Secondary | ICD-10-CM

## 2017-04-19 DIAGNOSIS — I10 Essential (primary) hypertension: Secondary | ICD-10-CM | POA: Diagnosis not present

## 2017-04-19 DIAGNOSIS — R739 Hyperglycemia, unspecified: Secondary | ICD-10-CM | POA: Diagnosis present

## 2017-04-19 DIAGNOSIS — Z7951 Long term (current) use of inhaled steroids: Secondary | ICD-10-CM | POA: Diagnosis not present

## 2017-04-19 DIAGNOSIS — I248 Other forms of acute ischemic heart disease: Secondary | ICD-10-CM | POA: Diagnosis present

## 2017-04-19 DIAGNOSIS — R0602 Shortness of breath: Secondary | ICD-10-CM

## 2017-04-19 DIAGNOSIS — I700 Atherosclerosis of aorta: Secondary | ICD-10-CM | POA: Diagnosis present

## 2017-04-19 DIAGNOSIS — R946 Abnormal results of thyroid function studies: Secondary | ICD-10-CM | POA: Diagnosis present

## 2017-04-19 DIAGNOSIS — Z8541 Personal history of malignant neoplasm of cervix uteri: Secondary | ICD-10-CM | POA: Diagnosis not present

## 2017-04-19 DIAGNOSIS — Z79899 Other long term (current) drug therapy: Secondary | ICD-10-CM

## 2017-04-19 DIAGNOSIS — I5041 Acute combined systolic (congestive) and diastolic (congestive) heart failure: Secondary | ICD-10-CM | POA: Diagnosis present

## 2017-04-19 DIAGNOSIS — E785 Hyperlipidemia, unspecified: Secondary | ICD-10-CM | POA: Diagnosis present

## 2017-04-19 DIAGNOSIS — J449 Chronic obstructive pulmonary disease, unspecified: Secondary | ICD-10-CM | POA: Diagnosis present

## 2017-04-19 DIAGNOSIS — I440 Atrioventricular block, first degree: Secondary | ICD-10-CM | POA: Diagnosis present

## 2017-04-19 DIAGNOSIS — M170 Bilateral primary osteoarthritis of knee: Secondary | ICD-10-CM | POA: Diagnosis present

## 2017-04-19 DIAGNOSIS — J9601 Acute respiratory failure with hypoxia: Secondary | ICD-10-CM | POA: Diagnosis present

## 2017-04-19 DIAGNOSIS — I472 Ventricular tachycardia: Secondary | ICD-10-CM | POA: Diagnosis present

## 2017-04-19 DIAGNOSIS — I110 Hypertensive heart disease with heart failure: Secondary | ICD-10-CM | POA: Diagnosis present

## 2017-04-19 DIAGNOSIS — I471 Supraventricular tachycardia: Secondary | ICD-10-CM | POA: Diagnosis present

## 2017-04-19 DIAGNOSIS — I340 Nonrheumatic mitral (valve) insufficiency: Secondary | ICD-10-CM | POA: Diagnosis not present

## 2017-04-19 DIAGNOSIS — Z87891 Personal history of nicotine dependence: Secondary | ICD-10-CM

## 2017-04-19 DIAGNOSIS — I509 Heart failure, unspecified: Secondary | ICD-10-CM | POA: Diagnosis not present

## 2017-04-19 DIAGNOSIS — E876 Hypokalemia: Secondary | ICD-10-CM | POA: Diagnosis present

## 2017-04-19 DIAGNOSIS — N179 Acute kidney failure, unspecified: Secondary | ICD-10-CM | POA: Diagnosis present

## 2017-04-19 DIAGNOSIS — I071 Rheumatic tricuspid insufficiency: Secondary | ICD-10-CM | POA: Diagnosis present

## 2017-04-19 DIAGNOSIS — Z888 Allergy status to other drugs, medicaments and biological substances status: Secondary | ICD-10-CM

## 2017-04-19 DIAGNOSIS — R778 Other specified abnormalities of plasma proteins: Secondary | ICD-10-CM | POA: Diagnosis present

## 2017-04-19 DIAGNOSIS — I5021 Acute systolic (congestive) heart failure: Secondary | ICD-10-CM | POA: Diagnosis not present

## 2017-04-19 DIAGNOSIS — I272 Pulmonary hypertension, unspecified: Secondary | ICD-10-CM | POA: Diagnosis present

## 2017-04-19 DIAGNOSIS — R7989 Other specified abnormal findings of blood chemistry: Secondary | ICD-10-CM | POA: Diagnosis present

## 2017-04-19 DIAGNOSIS — Z8249 Family history of ischemic heart disease and other diseases of the circulatory system: Secondary | ICD-10-CM

## 2017-04-19 LAB — ECHOCARDIOGRAM COMPLETE: WEIGHTICAEL: 4304 [oz_av]

## 2017-04-19 LAB — COMPREHENSIVE METABOLIC PANEL
ALBUMIN: 4.1 g/dL (ref 3.5–5.0)
ALT: 34 U/L (ref 14–54)
ANION GAP: 11 (ref 5–15)
AST: 32 U/L (ref 15–41)
Alkaline Phosphatase: 111 U/L (ref 38–126)
BUN: 18 mg/dL (ref 6–20)
CO2: 27 mmol/L (ref 22–32)
Calcium: 9.2 mg/dL (ref 8.9–10.3)
Chloride: 106 mmol/L (ref 101–111)
Creatinine, Ser: 1.03 mg/dL — ABNORMAL HIGH (ref 0.44–1.00)
GFR calc Af Amer: 60 mL/min — ABNORMAL LOW (ref >60)
GFR calc non Af Amer: 51 mL/min — ABNORMAL LOW (ref >60)
GLUCOSE: 126 mg/dL — AB (ref 65–99)
POTASSIUM: 2.7 mmol/L — AB (ref 3.5–5.1)
SODIUM: 144 mmol/L (ref 135–145)
TOTAL PROTEIN: 7.1 g/dL (ref 6.5–8.1)
Total Bilirubin: 2 mg/dL — ABNORMAL HIGH (ref 0.3–1.2)

## 2017-04-19 LAB — POTASSIUM: POTASSIUM: 3.4 mmol/L — AB (ref 3.5–5.1)

## 2017-04-19 LAB — CBC
HCT: 33.9 % — ABNORMAL LOW (ref 35.0–47.0)
Hemoglobin: 11.4 g/dL — ABNORMAL LOW (ref 12.0–16.0)
MCH: 30.1 pg (ref 26.0–34.0)
MCHC: 33.7 g/dL (ref 32.0–36.0)
MCV: 89.2 fL (ref 80.0–100.0)
Platelets: 261 10*3/uL (ref 150–440)
RBC: 3.79 MIL/uL — ABNORMAL LOW (ref 3.80–5.20)
RDW: 14.3 % (ref 11.5–14.5)
WBC: 5.2 10*3/uL (ref 3.6–11.0)

## 2017-04-19 LAB — TROPONIN I: Troponin I: 0.05 ng/mL (ref <0.03)

## 2017-04-19 LAB — BRAIN NATRIURETIC PEPTIDE: B Natriuretic Peptide: 1000 pg/mL — ABNORMAL HIGH (ref 0.0–100.0)

## 2017-04-19 LAB — MAGNESIUM
MAGNESIUM: 1.8 mg/dL (ref 1.7–2.4)
MAGNESIUM: 1.8 mg/dL (ref 1.7–2.4)

## 2017-04-19 LAB — TSH: TSH: 8.043 u[IU]/mL — AB (ref 0.350–4.500)

## 2017-04-19 MED ORDER — POTASSIUM CHLORIDE CRYS ER 20 MEQ PO TBCR
40.0000 meq | EXTENDED_RELEASE_TABLET | Freq: Once | ORAL | Status: AC
  Administered 2017-04-19: 40 meq via ORAL
  Filled 2017-04-19: qty 2

## 2017-04-19 MED ORDER — ASPIRIN 81 MG PO CHEW
81.0000 mg | CHEWABLE_TABLET | ORAL | Status: DC
Start: 2017-04-20 — End: 2017-04-21

## 2017-04-19 MED ORDER — SENNOSIDES-DOCUSATE SODIUM 8.6-50 MG PO TABS: 1.0000 | ORAL_TABLET | Freq: Every evening | ORAL | Status: DC | PRN

## 2017-04-19 MED ORDER — ENOXAPARIN SODIUM 40 MG/0.4ML ~~LOC~~ SOLN
40.0000 mg | Freq: Two times a day (BID) | SUBCUTANEOUS | Status: DC
  Administered 2017-04-19 – 2017-04-20 (×4): 40 mg via SUBCUTANEOUS
  Filled 2017-04-19 (×4): qty 0.4

## 2017-04-19 MED ORDER — MAGNESIUM SULFATE 2 GM/50ML IV SOLN
2.0000 g | Freq: Once | INTRAVENOUS | Status: AC
  Administered 2017-04-19: 2 g via INTRAVENOUS
  Filled 2017-04-19: qty 50

## 2017-04-19 MED ORDER — ATORVASTATIN CALCIUM 20 MG PO TABS
40.0000 mg | ORAL_TABLET | Freq: Every day | ORAL | Status: DC
  Administered 2017-04-20 – 2017-04-23 (×4): 40 mg via ORAL
  Filled 2017-04-19 (×4): qty 2

## 2017-04-19 MED ORDER — ONDANSETRON HCL 4 MG/2ML IJ SOLN: 4.0000 mg | Freq: Four times a day (QID) | INTRAMUSCULAR | Status: DC | PRN

## 2017-04-19 MED ORDER — MOMETASONE FURO-FORMOTEROL FUM 200-5 MCG/ACT IN AERO
2.0000 | INHALATION_SPRAY | Freq: Two times a day (BID) | RESPIRATORY_TRACT | Status: DC
Start: 2017-04-19 — End: 2017-04-23
  Administered 2017-04-19 – 2017-04-23 (×8): 2 via RESPIRATORY_TRACT
  Filled 2017-04-19: qty 8.8

## 2017-04-19 MED ORDER — LISINOPRIL 20 MG PO TABS
20.0000 mg | ORAL_TABLET | Freq: Every day | ORAL | Status: DC
  Administered 2017-04-20 – 2017-04-22 (×3): 20 mg via ORAL
  Filled 2017-04-19 (×3): qty 1

## 2017-04-19 MED ORDER — FUROSEMIDE 10 MG/ML IJ SOLN
40.0000 mg | Freq: Once | INTRAMUSCULAR | Status: AC
  Administered 2017-04-19: 40 mg via INTRAVENOUS
  Filled 2017-04-19: qty 4

## 2017-04-19 MED ORDER — LEVOCETIRIZINE DIHYDROCHLORIDE 5 MG PO TABS: 5.0000 mg | ORAL_TABLET | Freq: Every evening | ORAL | Status: DC

## 2017-04-19 MED ORDER — SODIUM CHLORIDE 0.9% FLUSH: 3.0000 mL | INTRAVENOUS | Status: DC | PRN

## 2017-04-19 MED ORDER — FLUTICASONE PROPIONATE 50 MCG/ACT NA SUSP
2.0000 | Freq: Every day | NASAL | Status: DC
  Administered 2017-04-20 – 2017-04-23 (×4): 2 via NASAL
  Filled 2017-04-19: qty 16

## 2017-04-19 MED ORDER — ASPIRIN EC 81 MG PO TBEC
81.0000 mg | DELAYED_RELEASE_TABLET | Freq: Every day | ORAL | Status: DC
  Administered 2017-04-20 – 2017-04-23 (×4): 81 mg via ORAL
  Filled 2017-04-19 (×4): qty 1

## 2017-04-19 MED ORDER — ACETAMINOPHEN 325 MG PO TABS
650.0000 mg | ORAL_TABLET | Freq: Four times a day (QID) | ORAL | Status: DC | PRN
  Administered 2017-04-20 (×2): 650 mg via ORAL
  Filled 2017-04-19 (×3): qty 2

## 2017-04-19 MED ORDER — SODIUM CHLORIDE 0.9% FLUSH
3.0000 mL | Freq: Two times a day (BID) | INTRAVENOUS | Status: DC
  Administered 2017-04-20: 3 mL via INTRAVENOUS

## 2017-04-19 MED ORDER — LISINOPRIL-HYDROCHLOROTHIAZIDE 20-12.5 MG PO TABS: 1.0000 | ORAL_TABLET | Freq: Every day | ORAL | Status: DC

## 2017-04-19 MED ORDER — ACETAMINOPHEN 650 MG RE SUPP: 650.0000 mg | Freq: Four times a day (QID) | RECTAL | Status: DC | PRN

## 2017-04-19 MED ORDER — MAGNESIUM SULFATE IN D5W 1-5 GM/100ML-% IV SOLN
1.0000 g | Freq: Once | INTRAVENOUS | Status: AC
  Administered 2017-04-19: 1 g via INTRAVENOUS
  Filled 2017-04-19: qty 100

## 2017-04-19 MED ORDER — SODIUM CHLORIDE 0.9% FLUSH
3.0000 mL | Freq: Two times a day (BID) | INTRAVENOUS | Status: DC
  Administered 2017-04-19 – 2017-04-23 (×7): 3 mL via INTRAVENOUS

## 2017-04-19 MED ORDER — OXYCODONE HCL 5 MG PO TABS: 5.0000 mg | ORAL_TABLET | ORAL | Status: DC | PRN

## 2017-04-19 MED ORDER — LORATADINE 10 MG PO TABS
10.0000 mg | ORAL_TABLET | Freq: Every evening | ORAL | Status: DC
  Administered 2017-04-19 – 2017-04-22 (×4): 10 mg via ORAL
  Filled 2017-04-19 (×4): qty 1

## 2017-04-19 MED ORDER — SODIUM CHLORIDE 0.9 % IV SOLN: INTRAVENOUS | Status: DC

## 2017-04-19 MED ORDER — ENOXAPARIN SODIUM 40 MG/0.4ML ~~LOC~~ SOLN: 40.0000 mg | SUBCUTANEOUS | Status: DC

## 2017-04-19 MED ORDER — CARVEDILOL 3.125 MG PO TABS
3.1250 mg | ORAL_TABLET | Freq: Two times a day (BID) | ORAL | Status: DC
  Administered 2017-04-19: 3.125 mg via ORAL
  Filled 2017-04-19: qty 1

## 2017-04-19 MED ORDER — HYDROCHLOROTHIAZIDE 12.5 MG PO CAPS: 12.5000 mg | ORAL_CAPSULE | Freq: Every day | ORAL | Status: DC

## 2017-04-19 MED ORDER — SODIUM CHLORIDE 0.9 % IV SOLN: 250.0000 mL | INTRAVENOUS | Status: DC | PRN

## 2017-04-19 MED ORDER — POTASSIUM CHLORIDE CRYS ER 20 MEQ PO TBCR
30.0000 meq | EXTENDED_RELEASE_TABLET | ORAL | Status: AC
  Administered 2017-04-19 (×2): 30 meq via ORAL
  Filled 2017-04-19 (×2): qty 1

## 2017-04-19 MED ORDER — ONDANSETRON HCL 4 MG PO TABS: 4.0000 mg | ORAL_TABLET | Freq: Four times a day (QID) | ORAL | Status: DC | PRN

## 2017-04-19 NOTE — Care Management (Signed)
CM screen due to age and diagnosis.  it appears it was attempted to resolve patient's heart failure sx as outpatient but shortness of breath did not improve. Currently is not requiring oxygen.  Independent in all adls, denies issues accessing medical care, obtaining medications or with transportation.  Current with her PCP.  Has access to scales.  Echo and cardiac cath pending.  heads up referral to [facility]

## 2017-04-19 NOTE — ED Notes (Signed)
Date and time results received: 04/19/17 7:57 AM  Test: Trop, K Critical Value: Trop 0.05, K 2.7  Name of Provider Notified: Dr. Corky Downs  Orders Received? Or Actions Taken?: Critical Value Acknowledged

## 2017-04-19 NOTE — H&P (Signed)
[facility] [place] NAME: Kimberly Henry    MR#:  [id number]  DATE OF BIRTH:  12-06-1940  DATE OF ADMISSION:  04/19/2017  PRIMARY CARE PHYSICIAN: Steele Sizer, MD   REQUESTING/REFERRING PHYSICIAN: dr Corky Downs  CHIEF COMPLAINT:   sob HISTORY OF PRESENT ILLNESS:  Kimberly Henry  is a 76 y.o. female with a known history of COPD and Remote history of CHF unknown diastolic or systolic who presents with shortness of breath. She was evaluated yesterday by cardiologist office due to increasing shortness of breath, weight gain and dyspnea. At that time she did appear to be volume overloaded and cardiology had recommended continuing Lasix 40 mg daily. They also recommended urgent echocardiogram followed by cardiac catheterization. She has been given IV Lasix with some improvement shortness of breath.  PAST MEDICAL HISTORY:   Past Medical History:  Diagnosis Date  . Acid phosphatase elevated   . AV block, 1st degree   . CHF (congestive heart failure) ([place])   . COPD (chronic obstructive pulmonary disease) ([place])   . Hx of cervical malignancy   . Hyperlipidemia   . Hypertension   . Metabolic syndrome   . Obesity, Class III, BMI 40-49.9 (morbid obesity) ([place])   . Osteoarthritis of both knees     PAST SURGICAL HISTORY:   Past Surgical History:  Procedure Laterality Date  . ABDOMINAL HYSTERECTOMY  1996  . CATARACT EXTRACTION Left 2008    SOCIAL HISTORY:   Social History  Substance Use Topics  . Smoking status: Former Smoker    Years: 10.00    Types: Cigarettes    Start date: 09/14/1975    Quit date: 06/22/1986  . Smokeless tobacco: Never Used  . Alcohol use No    FAMILY HISTORY:   Family History  Problem Relation Age of Onset  . Hypertension Mother   . Congestive Heart Failure Mother   . Congestive Heart Failure Father     DRUG ALLERGIES:   Allergies  Allergen Reactions  . Lovastatin     Other reaction(s): Muscle Pain     REVIEW OF SYSTEMS:   Review of Systems  Constitutional: Negative.  Negative for chills, fever and malaise/fatigue.  HENT: Negative.  Negative for ear discharge, ear pain, hearing loss, nosebleeds and sore throat.   Eyes: Negative.  Negative for blurred vision and pain.  Respiratory: Positive for shortness of breath. Negative for cough, hemoptysis and wheezing.   Cardiovascular: Positive for orthopnea, leg swelling and PND. Negative for chest pain and palpitations.  Gastrointestinal: Negative.  Negative for abdominal pain, blood in stool, diarrhea, nausea and vomiting.  Genitourinary: Negative.  Negative for dysuria.  Musculoskeletal: Negative.  Negative for back pain.  Skin: Negative.   Neurological: Negative for dizziness, tremors, speech change, focal weakness, seizures and headaches.  Endo/Heme/Allergies: Negative.  Does not bruise/bleed easily.  Psychiatric/Behavioral: Negative.  Negative for depression, hallucinations and suicidal ideas.    MEDICATIONS AT HOME:   Prior to Admission medications   Medication Sig Start Date End Date Taking? Authorizing Provider  acetaminophen (TYLENOL) 500 MG chewable tablet Chew 500 mg by mouth every 6 (six) hours as needed for pain.   Yes [provider]  aspirin EC 81 MG tablet Take 81 mg by mouth daily.   Yes [provider]  atorvastatin (LIPITOR) 40 MG tablet Take 1 tablet (40 mg total) by mouth daily. 03/15/17  Yes Sowles, Drue Stager, MD  budesonide-formoterol [facility]) 160-4.5 MCG/ACT inhaler Inhale 2 puffs into the  lungs daily. 03/02/17  Yes Hubbard Hartshorn, FNP  fluticasone (FLONASE) 50 MCG/ACT nasal spray USE 2 SPRAYS IN EACH  NOSTRIL DAILY 10/21/16  Yes Sowles, Drue Stager, MD  furosemide (LASIX) 40 MG tablet Take 1 tablet (40 mg total) by mouth daily. 04/18/17 04/18/18 Yes Wellington Hampshire, MD  levocetirizine (XYZAL) 5 MG tablet Take 1 tablet (5 mg total) by mouth every evening. 03/02/17  Yes Hubbard Hartshorn, FNP   lisinopril-hydrochlorothiazide (PRINZIDE,ZESTORETIC) 20-12.5 MG tablet Take 1 tablet by mouth daily. 03/02/17  Yes Hubbard Hartshorn, FNP  potassium chloride 20 MEQ TBCR Take 20 mEq by mouth daily. 04/18/17  Yes Wellington Hampshire, MD      VITAL SIGNS:  Blood pressure 136/86, pulse 88, temperature 97.6 F (36.4 C), temperature source Oral, resp. rate 20, weight 122 kg (269 lb), SpO2 98 %.  PHYSICAL EXAMINATION:   Physical Exam  Constitutional: She is oriented to person, place, and time and well-developed, well-nourished, and in no distress. No distress.  HENT:  Head: Normocephalic.  Eyes: No scleral icterus.  Neck: Normal range of motion. Neck supple. No JVD present. No tracheal deviation present.  Cardiovascular: Normal rate and regular rhythm.  Exam reveals no gallop and no friction rub.   No murmur heard. Pulmonary/Chest: Effort normal. No respiratory distress. She has no wheezes. She has rales. She exhibits no tenderness.  Abdominal: Soft. Bowel sounds are normal. She exhibits no distension and no mass. There is no tenderness. There is no rebound and no guarding.  Musculoskeletal: Normal range of motion. She exhibits edema.  Neurological: She is alert and oriented to person, place, and time.  Skin: Skin is warm. No rash noted. No erythema.  Psychiatric: Affect and judgment normal.      LABORATORY PANEL:   CBC  Recent Labs Lab 04/19/17 0701  WBC 5.2  HGB 11.4*  HCT 33.9*  PLT 261   ------------------------------------------------------------------------------------------------------------------  Chemistries   Recent Labs Lab 04/19/17 0701  NA 144  K 2.7*  CL 106  CO2 27  GLUCOSE 126*  BUN 18  CREATININE 1.03*  CALCIUM 9.2  MG 1.8  AST 32  ALT 34  ALKPHOS 111  BILITOT 2.0*   ------------------------------------------------------------------------------------------------------------------  Cardiac Enzymes  Recent Labs Lab 04/19/17 0701  TROPONINI 0.05*    ------------------------------------------------------------------------------------------------------------------  RADIOLOGY:  Dg Chest Portable 1 View  Result Date: 04/19/2017 CLINICAL DATA:  Shortness of breath. EXAM: PORTABLE CHEST 1 VIEW COMPARISON:  Chest x-ray dated April 15, 2021. FINDINGS: Cardiomegaly, unchanged. Mild pulmonary vascular congestion. Small right pleural effusion. Bibasilar atelectasis. No consolidation or pneumothorax. No acute osseous abnormality. IMPRESSION: Cardiomegaly and mild pulmonary vascular congestion with small right pleural effusion. Degree of fluid overload is improved from prior study. Electronically Signed   By: Titus Dubin M.D.   On: 04/19/2017 07:34    EKG:  Sinus rhythm with PVCs  IMPRESSION AND PLAN:   76 year old female with history of essential hypertension and remote history of unknown CHF who presented yesterday to cardiology's office with acute heart failure with increasing shortness of breath and now presents to the emergency room with worsening of her shortness of breath.  1. Acute hypoxic respiratory failure in the setting of acute heart failure likely systolic dysfunction: Order echocardiogram Cardiology consultation [facility] Monitor intake and output with daily weight Check TSH Lasix IV twice a day Monitor BMP  2. Essential hypertension: Continue lisinopril says HCTZ Add beta blocker once CHF has resolved  3. COPD without exacerbation: Continue Symbicort  4.  Obesity: Patient encouraged to lose weight as tolerated.  5. Hyperlipidemia: Continue Lipitor  6. Hypokalemia: Check magnesium level and replete.    All the records are reviewed and case discussed with ED provider. Management plans discussed with the patient and she in agreement  CODE STATUS: full  TOTAL TIME TAKING CARE OF THIS PATIENT: 45 minutes.    Kadelyn Dimascio M.D on 04/19/2017 at 10:34 AM  Between 7am to 6pm - Pager - [phone]  After 6pm go to  [email or web address] - password [email or web address] [facility]  Office  [phone]  CC: Primary care physician; Steele Sizer, MD

## 2017-04-19 NOTE — Progress Notes (Signed)
MEDICATION RELATED CONSULT NOTE - INITIAL   Pharmacy Consult for Electrolyte Monitoring and Replacement  Allergies  Allergen Reactions  . Lovastatin     Other reaction(s): Muscle Pain    Patient Measurements: Weight: 269 lb (122 kg)   Vital Signs: Temp: 97.6 F (36.4 C) (08/07 1030) Temp Source: Oral (08/07 1030) BP: 136/86 (08/07 1030) Pulse Rate: 88 (08/07 1030)  Labs:  Recent Labs  04/18/17 1412 04/19/17 0701  WBC  --  5.2  HGB  --  11.4*  HCT  --  33.9*  PLT  --  261  CREATININE 0.91 1.03*  MG  --  1.8  ALBUMIN  --  4.1  PROT  --  7.1  AST  --  32  ALT  --  34  ALKPHOS  --  111  BILITOT  --  2.0*   Lab Results  Component Value Date   K 2.7 (LL) 04/19/2017    Estimated Creatinine Clearance: 61.9 mL/min (A) (by C-G formula based on SCr of 1.03 mg/dL (H)).   Microbiology: No results found for this or any previous visit (from the past 720 hour(s)).    Assessment: 76 yo female with SOB and CHF. Pharmacy consulted for electrolyte monitoring and replacement.   Goal of Therapy:  K+: 3.5-5.1 mmol/L Mg: 1.9-2.4 mg/dL   Plan:  KCL 8mEq x 1 dose ordered this AM.  Will order an additional KCL 25mEq X 2 doses and Magnesium 1g IV x 1 dose.  Will recheck K+ level at 1800 and BMP and Mg with AM labs.   Pernell Dupre, PharmD, BCPS Clinical Pharmacist 04/19/2017 10:45 AM

## 2017-04-19 NOTE — ED Notes (Signed)
Admitting MD at bedside at this time.

## 2017-04-19 NOTE — ED Notes (Addendum)
Pt denies CP at this time. Pt placed on 2L for [facility] and comfort. Pt's O2 is 98% on 2L.

## 2017-04-19 NOTE — Progress Notes (Signed)
MEDICATION RELATED CONSULT NOTE - INITIAL   Pharmacy Consult for Electrolyte Monitoring and Replacement  Allergies  Allergen Reactions  . Lovastatin     Other reaction(s): Muscle Pain    Patient Measurements: Weight: 269 lb (122 kg)   Vital Signs: Temp: 97.6 F (36.4 C) (08/07 1030) Temp Source: Oral (08/07 1030) BP: 136/86 (08/07 1030) Pulse Rate: 88 (08/07 1030)  Labs:  Recent Labs  04/18/17 1412 04/19/17 0701 04/19/17 1917  WBC  --  5.2  --   HGB  --  11.4*  --   HCT  --  33.9*  --   PLT  --  261  --   CREATININE 0.91 1.03*  --   MG  --  1.8 1.8  ALBUMIN  --  4.1  --   PROT  --  7.1  --   AST  --  32  --   ALT  --  34  --   ALKPHOS  --  111  --   BILITOT  --  2.0*  --    Lab Results  Component Value Date   K 3.4 (L) 04/19/2017    Estimated Creatinine Clearance: 61.9 mL/min (A) (by C-G formula based on SCr of 1.03 mg/dL (H)).   Microbiology: No results found for this or any previous visit (from the past 720 hour(s)).    Assessment: 76 yo female with SOB and CHF. Pharmacy consulted for electrolyte monitoring and replacement.   Goal of Therapy:  K+: 4-5.1 mmol/L Mg: 2-2.4 mg/dL   Plan:  Mg=1.8, K=3.4 Will give Mg 2g IV for a goal Mg of >2 and KCL 40 MEQ po once for a goal K >4. Last po K tab given at 1600, probably not seeing full results of that replacement yet.  Recheck electrolytes in the AM  Ramond Dial, PharmD, BCPS Clinical Pharmacist 04/19/2017 8:00 PM

## 2017-04-19 NOTE — ED Notes (Signed)
Pt assisted to the bathroom by this RN.  

## 2017-04-19 NOTE — ED Triage Notes (Deleted)
Pt reports new diagnosis of CHF and states last night she was not able to sleep without feeling SHOB and "feeling full in chest", pt states "like a panic attack." Pt denies being prescribed new medication since CHF diagnosis. Pt reports increased swelling to bilateral lower legs as well. Pt A&O at this time.

## 2017-04-19 NOTE — Progress Notes (Signed)
20 beats run of vtac,Dr. Benjie Karvonen notified and labs ordered.Patient was in the bathroom and asymptomatic

## 2017-04-19 NOTE — ED Notes (Signed)
This RN to bedside to introduce self to patient and family. Pt is resting in bed, with 2L O2 via [place]. VSS and WNL at this time. Will continue to monitor for further patient needs. Pt given remote control per patient request.

## 2017-04-19 NOTE — ED Notes (Signed)
This RN to bedside, pt resting in bed with family at bedside, explained that patient had a ready bed, however was waiting for admitting MD to see patient prior to patient being transported to the floor. Pt states understanding.

## 2017-04-19 NOTE — Progress Notes (Signed)
Per Anticoagulation Monitoring Protocol for patient with BMI > 40 and estCrcl > 30 ml/min, will transition to Lovenox 40mg  q12h.

## 2017-04-19 NOTE — Consult Note (Signed)
Cardiology Consultation Note  Patient ID: Kimberly Henry, MRN: [id number], DOB/AGE: [birth year] 76 y.o. Admit date: 04/19/2017   Date of Consult: 04/19/2017 Primary Physician: Steele Sizer, MD Primary Cardiologist: Dr. Fletcher Anon, MD Requesting Physician: Dr. Benjie Karvonen, MD  Chief Complaint: SOb Reason for Consult: Acute CHF  HPI: Kimberly Henry is a 76 y.o. female who is being seen today for the evaluation of acute CHF at the request of Dr. Benjie Karvonen, MD. Patient has a h/o essential hypertension, obesity, COPD secondary to previous tobacco abuse, hyperlipidemia, and osteoarthritis who was admitted to [facility] on 8/7 with worsening shortness of breath.   Patient was seen in the office on 04/18/17 for evaluation of chest pain and shortness of breath with concern for CHF. Patient reported she been having progressive symptoms of exertional dyspnea and chest tightness since June which has worsened significantly over the prior 2 weeks. This has been associated with orthopnea, PND, and lower extremity edema. She had CT scanning of the chest done which showed pulmonary vascular congestion, cardiomegaly, aortic and coronary calcifications. Her symptoms worsen leading her to present to the emergency room on 04/15/17. BNP was in the 700 range. Troponin was 0.04. She was discharged on 3 doses of furosemide 40 mg daily. And follow-up on 8/6 she reported no significant improvement in her symptoms. She appeared to be dyspneic at rest. She was felt to be volume overloaded. Recommendation was made for urgent echocardiogram followed by left heart catheterization and possibly right heart catheterization depending on the results of the echocardiogram. Overnight into 8/7 patient reports her breathing was worse and she was orthopneic. She checked her pulse oximetry at home and found this to be in the 89-90% range. This prompted her to come to the ED. She does eat out at restaurants ~ 3-4 times weekly.   Upon her arrival to [facility] ED she was noted  to have blood pressure 125/80, heart rate 87 bpm, respirations 26/m, oxygen saturation 94% on room air, weight 269 pounds, afebrile. Labs showed an initial troponin 0.05, serum creatinine 1.03 with a baseline of approximately 0.9, potassium 2.7, T bili 2.0, white blood cell count 5.2, hemoglobin 11.4, platelet count 261, BNP 1000. Chest x-ray showed cardiomegaly with mild pulmonary vascular congestion with small right pleural effusion. The degree of fluid overload was improved from prior study. EKG showed NSR, 90 bpm, left bundle branch block (which is known dating back to at least 04/15/2017). In the ED, patient was given IV Lasix 40 mg 1, IV magnesium 1 g, KCl 70 mEq. Upon admission cardiology was asked to further evaluate. Currently, feels like her breathing is improving, though not back to baseline.    Past Medical History:  Diagnosis Date  . Acid phosphatase elevated   . AV block, 1st degree   . CHF (congestive heart failure) ([place])   . COPD (chronic obstructive pulmonary disease) ([place])   . Hx of cervical malignancy   . Hyperlipidemia   . Hypertension   . Metabolic syndrome   . Obesity, Class III, BMI 40-49.9 (morbid obesity) ([place])   . Osteoarthritis of both knees       Most Recent Cardiac Studies: none   Surgical History:  Past Surgical History:  Procedure Laterality Date  . ABDOMINAL HYSTERECTOMY  1996  . CATARACT EXTRACTION Left 2008     Home Meds: Prior to Admission medications   Medication Sig Start Date End Date Taking? Authorizing Provider  acetaminophen (TYLENOL) 500 MG chewable tablet Chew 500 mg by mouth every  6 (six) hours as needed for pain.   Yes [provider]  aspirin EC 81 MG tablet Take 81 mg by mouth daily.   Yes [provider]  atorvastatin (LIPITOR) 40 MG tablet Take 1 tablet (40 mg total) by mouth daily. 03/15/17  Yes Sowles, Drue Stager, MD  budesonide-formoterol [facility]) 160-4.5 MCG/ACT inhaler Inhale 2 puffs into the lungs daily. 03/02/17   Yes Hubbard Hartshorn, FNP  fluticasone (FLONASE) 50 MCG/ACT nasal spray USE 2 SPRAYS IN EACH  NOSTRIL DAILY 10/21/16  Yes Sowles, Drue Stager, MD  furosemide (LASIX) 40 MG tablet Take 1 tablet (40 mg total) by mouth daily. 04/18/17 04/18/18 Yes Wellington Hampshire, MD  levocetirizine (XYZAL) 5 MG tablet Take 1 tablet (5 mg total) by mouth every evening. 03/02/17  Yes Hubbard Hartshorn, FNP  lisinopril-hydrochlorothiazide (PRINZIDE,ZESTORETIC) 20-12.5 MG tablet Take 1 tablet by mouth daily. 03/02/17  Yes Hubbard Hartshorn, FNP  potassium chloride 20 MEQ TBCR Take 20 mEq by mouth daily. 04/18/17  Yes Wellington Hampshire, MD    Inpatient Medications:  . [START ON 04/20/2017] aspirin  81 mg Oral Pre-Cath  . aspirin EC  81 mg Oral Daily  . [START ON 04/20/2017] atorvastatin  40 mg Oral Daily  . enoxaparin (LOVENOX) injection  40 mg Subcutaneous Q12H  . [START ON 04/20/2017] fluticasone  2 spray Each Nare Daily  . [START ON 04/20/2017] lisinopril  20 mg Oral Daily   And  . [START ON 04/20/2017] hydrochlorothiazide  12.5 mg Oral Daily  . loratadine  10 mg Oral QPM  . mometasone-formoterol  2 puff Inhalation BID  . sodium chloride flush  3 mL Intravenous Q12H  . sodium chloride flush  3 mL Intravenous Q12H   . sodium chloride    . sodium chloride    . [START ON 04/20/2017] sodium chloride      Allergies:  Allergies  Allergen Reactions  . Lovastatin     Other reaction(s): Muscle Pain    Social History   Social History  . Marital status: Widowed    Spouse name: N/A  . Number of children: N/A  . Years of education: N/A   Occupational History  . Not on file.   Social History Main Topics  . Smoking status: Former Smoker    Years: 10.00    Types: Cigarettes    Start date: 09/14/1975    Quit date: 06/22/1986  . Smokeless tobacco: Never Used  . Alcohol use No  . Drug use: No  . Sexual activity: No   Other Topics Concern  . Not on file   Social History Narrative  . No narrative on file     Family History    Problem Relation Age of Onset  . Hypertension Mother   . Congestive Heart Failure Mother   . Congestive Heart Failure Father      Review of Systems: Review of Systems  Constitutional: Positive for malaise/fatigue. Negative for chills, diaphoresis, fever and weight loss.  HENT: Negative for congestion.   Eyes: Negative for discharge and redness.  Respiratory: Positive for cough and shortness of breath. Negative for hemoptysis, sputum production and wheezing.   Cardiovascular: Positive for orthopnea and leg swelling. Negative for chest pain, palpitations, claudication and PND.  Gastrointestinal: Negative for abdominal pain, blood in stool, heartburn, melena, nausea and vomiting.  Genitourinary: Negative for hematuria.  Musculoskeletal: Negative for falls and myalgias.  Skin: Negative for rash.  Neurological: Positive for weakness. Negative for dizziness, tingling, tremors, sensory change,  speech change, focal weakness and loss of consciousness.  Endo/Heme/Allergies: Does not bruise/bleed easily.  Psychiatric/Behavioral: Negative for substance abuse. The patient is not nervous/anxious.   All other systems reviewed and are negative.   Labs:  Recent Labs  04/19/17 0701  TROPONINI 0.05*   Lab Results  Component Value Date   WBC 5.2 04/19/2017   HGB 11.4 (L) 04/19/2017   HCT 33.9 (L) 04/19/2017   MCV 89.2 04/19/2017   PLT 261 04/19/2017    Recent Labs Lab 04/19/17 0701  NA 144  K 2.7*  CL 106  CO2 27  BUN 18  CREATININE 1.03*  CALCIUM 9.2  PROT 7.1  BILITOT 2.0*  ALKPHOS 111  ALT 34  AST 32  GLUCOSE 126*   Lab Results  Component Value Date   CHOL 173 03/02/2017   HDL 40 (L) 03/02/2017   LDLCALC 111 (H) 03/02/2017   TRIG 108 03/02/2017   No results found for: DDIMER  Radiology/Studies:  Dg Chest 2 View  Result Date: 04/15/2017 IMPRESSION: Cardiomegaly with vascular congestion and early interstitial edema pattern compared to the prior study. Aortic  atherosclerosis Electronically Signed   By: Jerilynn Mages.  Shick M.D.   On: 04/15/2017 13:29   Dg Chest Portable 1 View  Result Date: 04/19/2017 IMPRESSION: Cardiomegaly and mild pulmonary vascular congestion with small right pleural effusion. Degree of fluid overload is improved from prior study. Electronically Signed   By: Titus Dubin M.D.   On: 04/19/2017 07:34    EKG: Interpreted by me showed: NSR, 90 bpm, left bundle branch block (which is known dating back to at least 04/15/2017) Telemetry: Interpreted by me showed: NSR, 80s bpm, short run of NSVT at 15:21  Weights: Filed Weights   04/19/17 0646  Weight: 269 lb (122 kg)     Physical Exam: Blood pressure 136/86, pulse 88, temperature 97.6 F (36.4 C), temperature source Oral, resp. rate 20, weight 269 lb (122 kg), SpO2 98 %. Body mass index is 43.42 kg/m. General: Well developed, well nourished, in no acute distress. Head: Normocephalic, atraumatic, sclera non-icteric, no xanthomas, nares are without discharge.  Neck: Negative for carotid bruits. JVD elevated ~ 8 cm. Lungs: Diminished breath sounds bilaterally with bibasilar crackles. Breathing is unlabored. Heart: RRR with S1 S2. No murmurs, rubs, or gallops appreciated. Abdomen: Soft, non-tender, non-distended with normoactive bowel sounds. No hepatomegaly. No rebound/guarding. No obvious abdominal masses. Msk:  Strength and tone appear normal for age. Extremities: No clubbing or cyanosis. No edema. Distal pedal pulses are 2+ and equal bilaterally. Neuro: Alert and oriented X 3. No facial asymmetry. No focal deficit. Moves all extremities spontaneously. Psych:  Responds to questions appropriately with a normal affect.    Assessment and Plan:  Principal Problem:   Acute CHF (congestive heart failure) (HCC) Active Problems:   Elevated troponin   Hypokalemia   AKI (acute kidney injury) ([place])   Dyslipidemia   Hyperglycemia    1. Acute CHF, type unknown:  -Concern for systolic  given symptoms -She continues to appear volume overloaded  -Agree with IV diuresis with Lasix and potassium repletion  -Would hold hydrochlorothiazide and simply just increase IV Lasix as needed  -Check transthoracic echocardiogram  -If echocardiogram shows reduction in LV systolic function or wall motion abnormality would recommend inpatient ischemic evaluation with heart catheterization prior to discharge  -She will likely need to be diuresed before cardiac catheterization can be performed given her orthopnea  -Start low-dose carvedilol  -Continue lisinopril  -If EF is found  to be reduced would recommend initiating spironolactone pending renal function prior to discharge  -TSH pending   2. Elevated troponin:  -Initial troponin mildly elevated at 0.05  -Continue to cycle until peak  -ASA already administered  -Continue daily ASA  -No indication for heparin drip at this time unless there is dynamic elevation in troponin  -Check transthoracic echocardiogram as above   3. Hypertension:  -Controlled  -Continue current medications as above   4. AKI:  -Monitor with diuresis   5. Hyperlipidemia:  -Continue Lipitor   6. Hypokalemia:  -Replete to goal of 4.0  -Check magnesium    Signed, Christell Faith, PA-C [facility] Pager: [phone] 04/19/2017, 4:34 PM

## 2017-04-19 NOTE — ED Provider Notes (Signed)
[facility] Emergency Department Provider Note   ____________________________________________    I have reviewed the triage vital signs and the nursing notes.   HISTORY  Chief Complaint Shortness of Breath     HPI Kimberly Henry is a 76 y.o. female who presents with complaints of shortness of breath. Patient reports she was unable to sleep at all last night, her breathing was worse when she was trying to lie back but reports it was poor even when she was standing up. She reports she checked her pulse oximetry and it was 89-90%. She is not on home O2. She reports mild chest pressure. She denies fevers or chills or cough. She has been seen by cardiology for the first time 2 days ago and is scheduled for an echocardiogram as her symptoms are believed to be related to CHF. However she also has COPD. She reports she feels much better on oxygen   Past Medical History:  Diagnosis Date  . Acid phosphatase elevated   . AV block, 1st degree   . CHF (congestive heart failure) ([place])   . COPD (chronic obstructive pulmonary disease) ([place])   . Hx of cervical malignancy   . Hyperlipidemia   . Hypertension   . Metabolic syndrome   . Obesity, Class III, BMI 40-49.9 (morbid obesity) ([place])   . Osteoarthritis of both knees     Patient Active Problem List   Diagnosis Date Noted  . Aortic atherosclerosis ([place]) 03/10/2017  . Anemia, unspecified 03/08/2017  . Cardiomegaly 03/04/2017  . Abnormal CXR 03/04/2017  . Perennial allergic rhinitis with seasonal variation 05/30/2015  . 1st degree AV block 03/25/2015  . Extreme obesity 03/25/2015  . Metabolic syndrome 16/06/9603  . History of cervical cancer 03/03/2015  . Hypertension, benign 03/03/2015  . Osteoarthritis, knee 03/03/2015  . Dyslipidemia 03/03/2015  . Failed, ovarian, postablative 11/21/2007    Past Surgical History:  Procedure Laterality Date  . ABDOMINAL HYSTERECTOMY  1996  . CATARACT EXTRACTION Left  2008    Prior to Admission medications   Medication Sig Start Date End Date Taking? Authorizing Provider  acetaminophen (TYLENOL) 500 MG chewable tablet Chew 500 mg by mouth every 6 (six) hours as needed for pain.   Yes [provider]  aspirin EC 81 MG tablet Take 81 mg by mouth daily.   Yes [provider]  atorvastatin (LIPITOR) 40 MG tablet Take 1 tablet (40 mg total) by mouth daily. 03/15/17  Yes Sowles, Drue Stager, MD  budesonide-formoterol [facility]) 160-4.5 MCG/ACT inhaler Inhale 2 puffs into the lungs daily. 03/02/17  Yes Hubbard Hartshorn, FNP  fluticasone (FLONASE) 50 MCG/ACT nasal spray USE 2 SPRAYS IN EACH  NOSTRIL DAILY 10/21/16  Yes Sowles, Drue Stager, MD  furosemide (LASIX) 40 MG tablet Take 1 tablet (40 mg total) by mouth daily. 04/18/17 04/18/18 Yes Wellington Hampshire, MD  levocetirizine (XYZAL) 5 MG tablet Take 1 tablet (5 mg total) by mouth every evening. 03/02/17  Yes Hubbard Hartshorn, FNP  lisinopril-hydrochlorothiazide (PRINZIDE,ZESTORETIC) 20-12.5 MG tablet Take 1 tablet by mouth daily. 03/02/17  Yes Hubbard Hartshorn, FNP  potassium chloride 20 MEQ TBCR Take 20 mEq by mouth daily. 04/18/17  Yes Wellington Hampshire, MD     Allergies Lovastatin  Family History  Problem Relation Age of Onset  . Hypertension Mother   . Congestive Heart Failure Mother   . Congestive Heart Failure Father     Social History Social History  Substance Use Topics  . Smoking  status: Former Smoker    Years: 10.00    Types: Cigarettes    Start date: 09/14/1975    Quit date: 06/22/1986  . Smokeless tobacco: Never Used  . Alcohol use No    Review of Systems  Constitutional: No fever/chills Eyes: No visual changes.  ENT: No sore throat. Cardiovascular:As above Respiratory: As above Gastrointestinal: No abdominal pain.  No nausea, no vomiting.   Genitourinary: Negative for dysuria. Musculoskeletal: Negative for back pain. Skin: Negative for rash. Neurological: Negative for headaches     ____________________________________________   PHYSICAL EXAM:  VITAL SIGNS: ED Triage Vitals [04/19/17 0646]  Enc Vitals Group     BP      Pulse      Resp      Temp      Temp src      SpO2      Weight 122 kg (269 lb)     Height      Head Circumference      Peak Flow      Pain Score      Pain Loc      Pain Edu?      Excl. in [place]?     Constitutional: Alert and oriented. No acute distress. Pleasant and interactive Eyes: Conjunctivae are normal.  Nose: No congestion/rhinnorhea. Mouth/Throat: Mucous membranes are moist.   Neck:  Painless ROM Cardiovascular: Normal rate, regular rhythm. Mild systolic murmur.  Good peripheral circulation. Respiratory: Mildly increased respiratory effort with tachypnea, bibasilar rales Gastrointestinal: Soft and nontender. No distention.  No CVA tenderness. Genitourinary: deferred Musculoskeletal: No lower extremity tenderness nor edema.  Warm and well perfused Neurologic:  Normal speech and language. No gross focal neurologic deficits are appreciated.  Skin:  Skin is warm, dry and intact. No rash noted. Psychiatric: Mood and affect are normal. Speech and behavior are normal.  ____________________________________________   LABS (all labs ordered are listed, but only abnormal results are displayed)  Labs Reviewed  CBC - Abnormal; Notable for the following:       Result Value   RBC 3.79 (*)    Hemoglobin 11.4 (*)    HCT 33.9 (*)    All other components within normal limits  COMPREHENSIVE METABOLIC PANEL - Abnormal; Notable for the following:    Potassium 2.7 (*)    Glucose, Bld 126 (*)    Creatinine, Ser 1.03 (*)    Total Bilirubin 2.0 (*)    GFR calc non Af Amer 51 (*)    GFR calc Af Amer 60 (*)    All other components within normal limits  TROPONIN I - Abnormal; Notable for the following:    Troponin I 0.05 (*)    All other components within normal limits  BRAIN NATRIURETIC PEPTIDE - Abnormal; Notable for the following:    B  Natriuretic Peptide 1,000.0 (*)    All other components within normal limits   ____________________________________________  EKG  ED ECG REPORT I, Lavonia Drafts, the attending physician, personally viewed and interpreted this ECG.  Date: 04/19/2017 EKG Time: 648 Rate: 90 Rhythm: normal sinus rhythm QRS Axis: normal Intervals: lbbb ST/T Wave abnormalities: Nonspecific Narrative Interpretation: No significant change from prior  ____________________________________________  RADIOLOGY  Chest x-ray shows pleural effusion and pulmonary vascular congestion ____________________________________________   PROCEDURES  Procedure(s) performed: No    Critical Care performed: No ____________________________________________   INITIAL IMPRESSION / ASSESSMENT AND PLAN / ED COURSE  Pertinent labs & imaging results that were available during my care of the  patient were reviewed by me and considered in my medical decision making (see chart for details).  Patient presents with worsening shortness of breath. Seen in the ED several days ago, saw cardiology yesterday however symptoms have worsened despite compliance with her medication, Lasix. We will recheck labs x-ray. Anticipate CHF exacerbation that will require admission for diuresis and oxygen/echocardiogram  Patient with mildly elevated troponin, mild hypokalemia, K Dur given IV Lasix and admission    ____________________________________________   FINAL CLINICAL IMPRESSION(S) / ED DIAGNOSES  Final diagnoses:  Shortness of breath  Congestive heart failure, unspecified HF chronicity, unspecified heart failure type ([place])      NEW MEDICATIONS STARTED DURING THIS VISIT:  New Prescriptions   No medications on file     Note:  This document was prepared using Dragon voice recognition software and may include unintentional dictation errors.    Lavonia Drafts, MD 04/19/17 [phone]

## 2017-04-19 NOTE — Progress Notes (Signed)
*  PRELIMINARY RESULTS* Echocardiogram 2D Echocardiogram has been performed.  Sherrie Sport 04/19/2017, 3:05 PM

## 2017-04-19 NOTE — ED Notes (Signed)
Pt given warm blanket per her request and repositioned, pt states she tried to doze off but felt like she could breathe which was her problem at home. Pt sat up in bed, states she feels better. Will continue to monitor for further patient needs.

## 2017-04-19 NOTE — ED Triage Notes (Addendum)
Pt to RM 5 in wheelchair due to SOB. Pt reports she was seen in ED on Saturday, diagnosed with CHF exacerbation, told to follow up with cardiologist today. Pt unable to sleep and pt c/o increased SOB.

## 2017-04-20 ENCOUNTER — Other Ambulatory Visit: Payer: Medicare Other

## 2017-04-20 DIAGNOSIS — I472 Ventricular tachycardia: Secondary | ICD-10-CM

## 2017-04-20 DIAGNOSIS — I5021 Acute systolic (congestive) heart failure: Secondary | ICD-10-CM

## 2017-04-20 LAB — BASIC METABOLIC PANEL
Anion gap: 10 (ref 5–15)
BUN: 20 mg/dL (ref 6–20)
CHLORIDE: 106 mmol/L (ref 101–111)
CO2: 30 mmol/L (ref 22–32)
CREATININE: 0.9 mg/dL (ref 0.44–1.00)
Calcium: 9.1 mg/dL (ref 8.9–10.3)
GFR calc Af Amer: >60 mL/min (ref >60)
GFR calc non Af Amer: >60 mL/min (ref >60)
Glucose, Bld: 118 mg/dL — ABNORMAL HIGH (ref 65–99)
POTASSIUM: 3.5 mmol/L (ref 3.5–5.1)
SODIUM: 146 mmol/L — AB (ref 135–145)

## 2017-04-20 LAB — CBC
HCT: 34.1 % — ABNORMAL LOW (ref 35.0–47.0)
Hemoglobin: 11.4 g/dL — ABNORMAL LOW (ref 12.0–16.0)
MCH: 30.1 pg (ref 26.0–34.0)
MCHC: 33.5 g/dL (ref 32.0–36.0)
MCV: 89.9 fL (ref 80.0–100.0)
Platelets: 252 10*3/uL (ref 150–440)
RBC: 3.79 MIL/uL — ABNORMAL LOW (ref 3.80–5.20)
RDW: 14.2 % (ref 11.5–14.5)
WBC: 5.8 10*3/uL (ref 3.6–11.0)

## 2017-04-20 LAB — T4, FREE: FREE T4: 1.31 ng/dL — AB (ref 0.61–1.12)

## 2017-04-20 LAB — MAGNESIUM: MAGNESIUM: 2.2 mg/dL (ref 1.7–2.4)

## 2017-04-20 LAB — POTASSIUM: Potassium: 3.9 mmol/L (ref 3.5–5.1)

## 2017-04-20 MED ORDER — SODIUM CHLORIDE 0.9% FLUSH
3.0000 mL | Freq: Two times a day (BID) | INTRAVENOUS | Status: DC
  Administered 2017-04-21: 3 mL via INTRAVENOUS

## 2017-04-20 MED ORDER — ALBUTEROL SULFATE (2.5 MG/3ML) 0.083% IN NEBU
2.5000 mg | INHALATION_SOLUTION | Freq: Four times a day (QID) | RESPIRATORY_TRACT | Status: DC | PRN
  Administered 2017-04-20 – 2017-04-21 (×2): 2.5 mg via RESPIRATORY_TRACT
  Filled 2017-04-20 (×2): qty 3

## 2017-04-20 MED ORDER — SODIUM CHLORIDE 0.9% FLUSH: 3.0000 mL | INTRAVENOUS | Status: DC | PRN

## 2017-04-20 MED ORDER — POTASSIUM CHLORIDE CRYS ER 20 MEQ PO TBCR
40.0000 meq | EXTENDED_RELEASE_TABLET | ORAL | Status: AC
  Administered 2017-04-20 (×2): 40 meq via ORAL
  Filled 2017-04-20 (×2): qty 2

## 2017-04-20 MED ORDER — SODIUM CHLORIDE 0.9 % IV SOLN: INTRAVENOUS | Status: DC

## 2017-04-20 MED ORDER — FUROSEMIDE 10 MG/ML IJ SOLN
40.0000 mg | Freq: Two times a day (BID) | INTRAMUSCULAR | Status: DC
  Administered 2017-04-20 – 2017-04-21 (×2): 40 mg via INTRAVENOUS
  Filled 2017-04-20 (×2): qty 4

## 2017-04-20 MED ORDER — SODIUM CHLORIDE 0.9 % IV SOLN: 250.0000 mL | INTRAVENOUS | Status: DC | PRN

## 2017-04-20 MED ORDER — CARVEDILOL 6.25 MG PO TABS
6.2500 mg | ORAL_TABLET | Freq: Two times a day (BID) | ORAL | Status: DC
  Administered 2017-04-20 – 2017-04-23 (×6): 6.25 mg via ORAL
  Filled 2017-04-20 (×6): qty 1

## 2017-04-20 MED ORDER — POTASSIUM CHLORIDE 20 MEQ PO PACK
20.0000 meq | PACK | Freq: Once | ORAL | Status: AC
  Administered 2017-04-20: 20 meq via ORAL
  Filled 2017-04-20: qty 1

## 2017-04-20 NOTE — Progress Notes (Signed)
Kimberly Henry NAME: Kimberly Henry    MR#:  [id number]  DATE OF BIRTH:  1941/02/01  SUBJECTIVE:   Patient doing much better this morning. No shortness of breath. Lower extremity edema has improved.  REVIEW OF SYSTEMS:    Review of Systems  Constitutional: Negative for fever, chills weight loss HENT: Negative for ear pain, nosebleeds, congestion, facial swelling, rhinorrhea, neck pain, neck stiffness and ear discharge.   Respiratory: Negative for cough, shortness of breath, wheezing  Cardiovascular: Negative for chest pain, palpitations and leg swelling.  Gastrointestinal: Negative for heartburn, abdominal pain, vomiting, diarrhea or consitpation Genitourinary: Negative for dysuria, urgency, frequency, hematuria Musculoskeletal: Negative for back pain or joint pain Neurological: Negative for dizziness, seizures, syncope, focal weakness,  numbness and headaches.  Hematological: Does not bruise/bleed easily.  Psychiatric/Behavioral: Negative for hallucinations, confusion, dysphoric mood    Tolerating Diet: yes      DRUG ALLERGIES:   Allergies  Allergen Reactions  . Lovastatin     Other reaction(s): Muscle Pain    VITALS:  Blood pressure 135/82, pulse 83, temperature 97.7 F (36.5 C), temperature source Oral, resp. rate 18, weight 122.1 kg (269 lb 1.6 oz), SpO2 98 %.  PHYSICAL EXAMINATION:  Constitutional: Appears well-developed and well-nourished. No distress. HENT: Normocephalic. Marland Kitchen Oropharynx is clear and moist.  Eyes: Conjunctivae and EOM are normal. PERRLA, no scleral icterus.  Neck: Normal ROM. Neck supple. No JVD. No tracheal deviation. CVS: RRR, S1/S2 +, no murmurs, no gallops, no carotid bruit.  Pulmonary: Effort and breath sounds normal, no stridor, rhonchi, wheezes, rales.  Abdominal: Soft. BS +,  no distension, tenderness, rebound or guarding.  Musculoskeletal: Normal range of motion. No edema and no  tenderness.  Neuro: Alert. CN 2-12 grossly intact. No focal deficits. Skin: Skin is warm and dry. No rash noted. Psychiatric: Normal mood and affect.      LABORATORY PANEL:   CBC  Recent Labs Lab 04/20/17 0324  WBC 5.8  HGB 11.4*  HCT 34.1*  PLT 252   ------------------------------------------------------------------------------------------------------------------  Chemistries   Recent Labs Lab 04/19/17 0701  04/20/17 0324  NA 144  --  146*  K 2.7*  < > 3.5  CL 106  --  106  CO2 27  --  30  GLUCOSE 126*  --  118*  BUN 18  --  20  CREATININE 1.03*  --  0.90  CALCIUM 9.2  --  9.1  MG 1.8  < > 2.2  AST 32  --   --   ALT 34  --   --   ALKPHOS 111  --   --   BILITOT 2.0*  --   --   < > = values in this interval not displayed. ------------------------------------------------------------------------------------------------------------------  Cardiac Enzymes  Recent Labs Lab 04/15/17 1249 04/15/17 1653 04/19/17 0701  TROPONINI 0.04* 0.04* 0.05*   ------------------------------------------------------------------------------------------------------------------  RADIOLOGY:  Dg Chest Portable 1 View  Result Date: 04/19/2017 CLINICAL DATA:  Shortness of breath. EXAM: PORTABLE CHEST 1 VIEW COMPARISON:  Chest x-ray dated April 15, 2021. FINDINGS: Cardiomegaly, unchanged. Mild pulmonary vascular congestion. Small right pleural effusion. Bibasilar atelectasis. No consolidation or pneumothorax. No acute osseous abnormality. IMPRESSION: Cardiomegaly and mild pulmonary vascular congestion with small right pleural effusion. Degree of fluid overload is improved from prior study. Electronically Signed   By: Titus Dubin M.D.   On: 04/19/2017 07:34     ASSESSMENT AND PLAN:    76 year old female  with history of essential hypertension, COPD and hyperlipidemia who presented with increasing shortness of breath.  1. Acute combined systolic and diastolic heart failure with  ejection fraction 30-35% and pulmonary hypertension: Patient symptoms have improved. Continue IV diuresis with Lasix Monitor intake and output Continue lisinopril and Coreg Patient will undergo cardiac catheterization due to new diagnosis of decreased ejection fraction tomorrow.  2. Elevated troponin: This is due to demand ischemia in the setting of volume overload Cardiac catheterization planned for tomorrow  3. Non-SVT: Continue Coreg Keep K>4 and MG>2  4. Elevated TSH: Check T4 and T3  5. Essential hypertension: Continue lisinopril and Coreg  6. COPD without exacerbation  Management plans discussed with the patient and she is in agreement.  CODE STATUS: full  TOTAL TIME TAKING CARE OF THIS PATIENT: 30 minutes.   D/w cardiology POSSIBLE D/C friday, DEPENDING ON CLINICAL CONDITION.   [name] M.D on 04/20/2017 at 10:08 AM  Between 7am to 6pm - Pager - [phone] After 6pm go to [email or web address] - password [email or web address] [facility]  Office  [phone]  CC: Primary care physician; Steele Sizer, MD  Note: This dictation was prepared with Dragon dictation along with smaller phrase technology. Any transcriptional errors that result from this process are unintentional.

## 2017-04-20 NOTE — Progress Notes (Signed)
MEDICATION RELATED CONSULT NOTE - INITIAL   Pharmacy Consult for Electrolyte Monitoring and Replacement  Allergies  Allergen Reactions  . Lovastatin     Other reaction(s): Muscle Pain    Patient Measurements: Weight: 269 lb 1.6 oz (122.1 kg)   Vital Signs: Temp: 97.7 F (36.5 C) (08/08 0919) Temp Source: Oral (08/08 0919) BP: 135/82 (08/08 0919) Pulse Rate: 83 (08/08 0919)  Labs:  Recent Labs  04/18/17 1412 04/19/17 0701 04/19/17 1917 04/20/17 0324  WBC  --  5.2  --  5.8  HGB  --  11.4*  --  11.4*  HCT  --  33.9*  --  34.1*  PLT  --  261  --  252  CREATININE 0.91 1.03*  --  0.90  MG  --  1.8 1.8 2.2  ALBUMIN  --  4.1  --   --   PROT  --  7.1  --   --   AST  --  32  --   --   ALT  --  34  --   --   ALKPHOS  --  111  --   --   BILITOT  --  2.0*  --   --    Lab Results  Component Value Date   K 3.9 04/20/2017    Estimated Creatinine Clearance: 70.9 mL/min (by C-G formula based on SCr of 0.9 mg/dL).   Microbiology: No results found for this or any previous visit (from the past 720 hour(s)).    Assessment: 76 yo female with SOB and CHF. Pharmacy consulted for electrolyte monitoring and replacement.   Goal of Therapy:  K+: 4-5.1 mmol/L Mg: 2-2.4 mg/dL   Plan:  8/8 1800 k+: 3.9- Goal >4.0. Will order KCL 72mEq x1 dose.  Follow up with AM labs and continue to replace as needed.   Pernell Dupre, PharmD, BCPS Clinical Pharmacist 04/20/2017 6:42 PM

## 2017-04-20 NOTE — Progress Notes (Signed)
Patient Name: Kimberly Henry Date of Encounter: 04/20/2017  Primary Cardiologist: [facility] Problem List     Principal Problem:   Acute CHF (congestive heart failure) ([place]) Active Problems:   Elevated troponin   Hypokalemia   AKI (acute kidney injury) ([place])   Dyslipidemia   Hyperglycemia     Subjective   Breathing improved. Documented UOP of 350 mL, patient reports voiding in the toilet and flushing. Renal function improving with diuresis. Less orthopneic overnight. Remains on supplemental oxygen. Echo showed EF 30-35%, probable HK of the anteroseptal myocardium, DD, mild MR, mildly dilated RV, moderately dilated LA, mildly dilated RA, moderate TR, moderately elevated PASP.   Inpatient Medications    Scheduled Meds: . aspirin  81 mg Oral Pre-Cath  . aspirin EC  81 mg Oral Daily  . atorvastatin  40 mg Oral Daily  . carvedilol  3.125 mg Oral BID WC  . enoxaparin (LOVENOX) injection  40 mg Subcutaneous Q12H  . fluticasone  2 spray Each Nare Daily  . lisinopril  20 mg Oral Daily  . loratadine  10 mg Oral QPM  . mometasone-formoterol  2 puff Inhalation BID  . potassium chloride  40 mEq Oral Q4H  . sodium chloride flush  3 mL Intravenous Q12H  . sodium chloride flush  3 mL Intravenous Q12H   Continuous Infusions: . sodium chloride    . sodium chloride    . sodium chloride     PRN Meds: sodium chloride, sodium chloride, acetaminophen **OR** acetaminophen, ondansetron **OR** ondansetron (ZOFRAN) IV, oxyCODONE, senna-docusate, sodium chloride flush, sodium chloride flush   Vital Signs    Vitals:   04/19/17 1030 04/19/17 2001 04/20/17 0124 04/20/17 0416  BP: 136/86 133/79  (!) 118/56  Pulse: 88 84  84  Resp: 20 18  18   Temp: 97.6 F (36.4 C) 98 F (36.7 C)  98 F (36.7 C)  TempSrc: Oral Oral    SpO2: 98% 96%  94%  Weight:   269 lb 1.6 oz (122.1 kg)     Intake/Output Summary (Last 24 hours) at 04/20/17 0856 Last data filed at 04/20/17 0527  Gross per 24  hour  Intake              480 ml  Output              850 ml  Net             -370 ml   Filed Weights   04/19/17 0646 04/20/17 0124  Weight: 269 lb (122 kg) 269 lb 1.6 oz (122.1 kg)    Physical Exam    GEN: Well nourished, well developed, in no acute distress.  HEENT: Grossly normal.  Neck: Supple, JVD elevated ~ 6 cm, no carotid bruits, or masses. Cardiac: RRR, no murmurs, rubs, or gallops. No clubbing, cyanosis, trace pre-tibial edema bilaterally.  Radials/DP/PT 2+ and equal bilaterally.  Respiratory:  Improved breath sounds bilaterally. GI: Soft, nontender, nondistended, BS + x 4. MS: no deformity or atrophy. Skin: warm and dry, no rash. Neuro:  Strength and sensation are intact. Psych: AAOx3.  Normal affect.  Labs    CBC  Recent Labs  04/19/17 0701 04/20/17 0324  WBC 5.2 5.8  HGB 11.4* 11.4*  HCT 33.9* 34.1*  MCV 89.2 89.9  PLT 261 196   Basic Metabolic Panel  Recent Labs  04/19/17 0701 04/19/17 1917 04/20/17 0324  NA 144  --  146*  K 2.7* 3.4* 3.5  CL  106  --  106  CO2 27  --  30  GLUCOSE 126*  --  118*  BUN 18  --  20  CREATININE 1.03*  --  0.90  CALCIUM 9.2  --  9.1  MG 1.8 1.8 2.2   Liver Function Tests  Recent Labs  04/19/17 0701  AST 32  ALT 34  ALKPHOS 111  BILITOT 2.0*  PROT 7.1  ALBUMIN 4.1   No results for input(s): LIPASE, AMYLASE in the last 72 hours. Cardiac Enzymes  Recent Labs  04/19/17 0701  TROPONINI 0.05*   BNP Invalid input(s): POCBNP D-Dimer No results for input(s): DDIMER in the last 72 hours. Hemoglobin A1C No results for input(s): HGBA1C in the last 72 hours. Fasting Lipid Panel No results for input(s): CHOL, HDL, LDLCALC, TRIG, CHOLHDL, LDLDIRECT in the last 72 hours. Thyroid Function Tests  Recent Labs  04/19/17 0701  TSH 8.043*    Telemetry    NSR, PVCs, short run of NSVT PM of 8/7 - Personally Reviewed  ECG    n/a - Personally Reviewed  Radiology    Dg Chest Portable 1 View  Result  Date: 04/19/2017 IMPRESSION: Cardiomegaly and mild pulmonary vascular congestion with small right pleural effusion. Degree of fluid overload is improved from prior study. Electronically Signed   By: Titus Dubin M.D.   On: 04/19/2017 07:34    Cardiac Studies   TTE 04/19/17: Study Conclusions  - Left ventricle: The cavity size was at the upper limits of   normal. Wall thickness was increased in a pattern of mild LVH.   Systolic function was moderately to severely reduced. The   estimated ejection fraction was in the range of 30% to 35%.   Probable hypokinesis of the anteroseptal myocardium. Doppler   parameters are consistent with restrictive physiology, indicative   of decreased left ventricular diastolic compliance and/or   increased left atrial pressure. Doppler parameters are consistent   with high ventricular filling pressure. - Aortic valve: Trileaflet; mildly thickened, mildly calcified   leaflets. Transvalvular velocity was within the normal range.   There was no stenosis. - Mitral valve: Calcified annulus. Mildly thickened leaflets .   There was mild regurgitation. - Left atrium: The atrium was moderately dilated. - Right ventricle: The cavity size was mildly dilated. Systolic   function was mildly reduced. - Right atrium: The atrium was mildly dilated. - Tricuspid valve: There was moderate regurgitation. - Pulmonary arteries: Systolic pressure was mildly to moderately   increased.  Patient Profile     76 y.o. female with history of essential hypertension, obesity, COPD secondary to previous tobacco abuse, hyperlipidemia, and osteoarthritis who was admitted to [facility] on 8/7 with worsening shortness of breath. She was found to have acute combined CHF and pulmonary hypertension.   Assessment & Plan    1. Acute combined CHF/pulmonary hypertension: -Improving -Documented UOP of 350 mL, patient reports significant more UOP, has been voiding in the toilet and flushing -Strict  Is and Os, daily standing weights -Continue IV diuresis today -R/LHC on 8/9 -TTE as above -Titrate Coreg -Continue lisinopril -Prior to discharge recommend adding spironolactone  -Risks and benefits of cardiac catheterization have been discussed with the patient including risks of bleeding, bruising, infection, kidney damage, stroke, heart attack, and death. The patient understands these risks and is willing to proceed with the procedure. All questions have been answered and concerns listened to  2. Elevated troponin: -Mildly elevated with a peak of 0.05 -Likely supply demand ischemia  in the setting of volume overload -Cath as above  3. NSVT: -Asymptomatic -Titrate Coreg -Magnesium at goal -TSH elevated -Recommend thyroid studies, defer to IM  4. AKI: -Improving with diuresis  5. Hypokalemia: -Replete to goal 4.0  6. HTN: -Controlled  Signed, Christell Faith, PA-C [facility] Pager: [phone] 04/20/2017, 8:56 AM

## 2017-04-20 NOTE — Progress Notes (Signed)
MEDICATION RELATED CONSULT NOTE - INITIAL   Pharmacy Consult for Electrolyte Monitoring and Replacement  Allergies  Allergen Reactions  . Lovastatin     Other reaction(s): Muscle Pain    Patient Measurements: Weight: 269 lb 1.6 oz (122.1 kg)   Vital Signs: Temp: 98 F (36.7 C) (08/08 0416) Temp Source: Oral (08/07 2001) BP: 118/56 (08/08 0416) Pulse Rate: 84 (08/08 0416)  Labs:  Recent Labs  04/18/17 1412 04/19/17 0701 04/19/17 1917 04/20/17 0324  WBC  --  5.2  --  5.8  HGB  --  11.4*  --  11.4*  HCT  --  33.9*  --  34.1*  PLT  --  261  --  252  CREATININE 0.91 1.03*  --  0.90  MG  --  1.8 1.8 2.2  ALBUMIN  --  4.1  --   --   PROT  --  7.1  --   --   AST  --  32  --   --   ALT  --  34  --   --   ALKPHOS  --  111  --   --   BILITOT  --  2.0*  --   --    Lab Results  Component Value Date   K 3.5 04/20/2017    Estimated Creatinine Clearance: 70.9 mL/min (by C-G formula based on SCr of 0.9 mg/dL).   Microbiology: No results found for this or any previous visit (from the past 720 hour(s)).    Assessment: 76 yo female with SOB and CHF. Pharmacy consulted for electrolyte monitoring and replacement.   Goal of Therapy:  K+: 4-5.1 mmol/L Mg: 2-2.4 mg/dL   Plan:  Mg=2.2, K=3.5  KCL 40 MEQ q 4 hr x 2 doses. Recheck K level at 1800. K goal >4  Ramond Dial, PharmD, BCPS Clinical Pharmacist 04/20/2017 7:53 AM

## 2017-04-21 ENCOUNTER — Encounter: Payer: Self-pay | Admitting: Cardiovascular Disease

## 2017-04-21 ENCOUNTER — Encounter
Admission: EM | Disposition: A | Discharge status: Home / Self Care | Payer: Self-pay | Source: Home / Self Care | Attending: Internal Medicine

## 2017-04-21 HISTORY — PX: RIGHT/LEFT HEART CATH AND CORONARY ANGIOGRAPHY: CATH118266

## 2017-04-21 LAB — BASIC METABOLIC PANEL
ANION GAP: 8 (ref 5–15)
BUN: 22 mg/dL — AB (ref 6–20)
CALCIUM: 9.7 mg/dL (ref 8.9–10.3)
CO2: 29 mmol/L (ref 22–32)
Chloride: 109 mmol/L (ref 101–111)
Creatinine, Ser: 0.89 mg/dL (ref 0.44–1.00)
GFR calc Af Amer: >60 mL/min (ref >60)
GLUCOSE: 130 mg/dL — AB (ref 65–99)
Potassium: 4.1 mmol/L (ref 3.5–5.1)
SODIUM: 146 mmol/L — AB (ref 135–145)

## 2017-04-21 LAB — T3, FREE: T3 FREE: 3.1 pg/mL (ref 2.0–4.4)

## 2017-04-21 SURGERY — RIGHT/LEFT HEART CATH AND CORONARY ANGIOGRAPHY
Anesthesia: Moderate Sedation

## 2017-04-21 MED ORDER — LIDOCAINE HCL (PF) 1 % IJ SOLN
INTRAMUSCULAR | Status: AC
  Filled 2017-04-21: qty 30

## 2017-04-21 MED ORDER — FENTANYL CITRATE (PF) 100 MCG/2ML IJ SOLN
INTRAMUSCULAR | Status: AC
  Filled 2017-04-21: qty 2

## 2017-04-21 MED ORDER — SODIUM CHLORIDE 0.9% FLUSH
3.0000 mL | Freq: Two times a day (BID) | INTRAVENOUS | Status: DC
  Administered 2017-04-21 – 2017-04-22 (×2): 3 mL via INTRAVENOUS

## 2017-04-21 MED ORDER — MIDAZOLAM HCL 2 MG/2ML IJ SOLN
INTRAMUSCULAR | Status: DC | PRN
  Administered 2017-04-21: 1 mg via INTRAVENOUS

## 2017-04-21 MED ORDER — LEVOTHYROXINE SODIUM 25 MCG PO TABS
25.0000 ug | ORAL_TABLET | Freq: Every day | ORAL | Status: DC
  Administered 2017-04-22 – 2017-04-23 (×2): 25 ug via ORAL
  Filled 2017-04-21 (×2): qty 1

## 2017-04-21 MED ORDER — MIDAZOLAM HCL 2 MG/2ML IJ SOLN
INTRAMUSCULAR | Status: AC
  Filled 2017-04-21: qty 2

## 2017-04-21 MED ORDER — SODIUM CHLORIDE 0.9% FLUSH: 3.0000 mL | INTRAVENOUS | Status: DC | PRN

## 2017-04-21 MED ORDER — SPIRONOLACTONE 25 MG PO TABS
25.0000 mg | ORAL_TABLET | Freq: Every day | ORAL | Status: DC
  Administered 2017-04-21 – 2017-04-23 (×3): 25 mg via ORAL
  Filled 2017-04-21 (×3): qty 1

## 2017-04-21 MED ORDER — HEPARIN SODIUM (PORCINE) 1000 UNIT/ML IJ SOLN
INTRAMUSCULAR | Status: DC | PRN
  Administered 2017-04-21: 6000 [IU] via INTRAVENOUS

## 2017-04-21 MED ORDER — DIGOXIN 125 MCG PO TABS
0.1250 mg | ORAL_TABLET | Freq: Every day | ORAL | Status: DC
  Administered 2017-04-22 – 2017-04-23 (×2): 0.125 mg via ORAL
  Filled 2017-04-21 (×2): qty 1

## 2017-04-21 MED ORDER — VERAPAMIL HCL 2.5 MG/ML IV SOLN
INTRAVENOUS | Status: AC
  Filled 2017-04-21: qty 2

## 2017-04-21 MED ORDER — DIGOXIN 250 MCG PO TABS
0.2500 mg | ORAL_TABLET | Freq: Once | ORAL | Status: AC
  Administered 2017-04-21: 0.25 mg via ORAL
  Filled 2017-04-21: qty 1

## 2017-04-21 MED ORDER — POTASSIUM CHLORIDE CRYS ER 20 MEQ PO TBCR
20.0000 meq | EXTENDED_RELEASE_TABLET | Freq: Once | ORAL | Status: AC
  Administered 2017-04-21: 20 meq via ORAL
  Filled 2017-04-21: qty 1

## 2017-04-21 MED ORDER — FUROSEMIDE 10 MG/ML IJ SOLN
60.0000 mg | Freq: Two times a day (BID) | INTRAMUSCULAR | Status: DC
  Administered 2017-04-21 – 2017-04-23 (×4): 60 mg via INTRAVENOUS
  Filled 2017-04-21 (×4): qty 6

## 2017-04-21 MED ORDER — WHITE PETROLATUM GEL
Status: AC
  Filled 2017-04-21: qty 5

## 2017-04-21 MED ORDER — SODIUM CHLORIDE 0.9 % IV SOLN: 250.0000 mL | INTRAVENOUS | Status: DC | PRN

## 2017-04-21 MED ORDER — HEPARIN (PORCINE) IN NACL 2-0.9 UNIT/ML-% IJ SOLN
INTRAMUSCULAR | Status: AC
  Filled 2017-04-21: qty 500

## 2017-04-21 MED ORDER — IOPAMIDOL (ISOVUE-300) INJECTION 61%
INTRAVENOUS | Status: DC | PRN
  Administered 2017-04-21: 60 mL via INTRA_ARTERIAL

## 2017-04-21 MED ORDER — FENTANYL CITRATE (PF) 100 MCG/2ML IJ SOLN
INTRAMUSCULAR | Status: DC | PRN
  Administered 2017-04-21: 25 ug via INTRAVENOUS

## 2017-04-21 MED ORDER — HEPARIN SODIUM (PORCINE) 1000 UNIT/ML IJ SOLN
INTRAMUSCULAR | Status: AC
Start: 2017-04-21 — End: ?
  Filled 2017-04-21: qty 1

## 2017-04-21 SURGICAL SUPPLY — 9 items
CATH BALLN WEDGE 5F 110CM (CATHETERS) ×2 IMPLANT
CATH OPTITORQUE JACKY 4.0 5F (CATHETERS) ×2 IMPLANT
DEVICE RAD TR BAND REGULAR (VASCULAR PRODUCTS) ×2 IMPLANT
GLIDESHEATH SLEND SS 6F .021 (SHEATH) ×2 IMPLANT
KIT MANI 3VAL PERCEP (MISCELLANEOUS) ×2 IMPLANT
KIT RIGHT HEART (MISCELLANEOUS) ×2 IMPLANT
PACK CARDIAC CATH (CUSTOM PROCEDURE TRAY) ×2 IMPLANT
SHEATH GLIDE SLENDER 4/5FR (SHEATH) ×2 IMPLANT
WIRE ROSEN-J .035X260CM (WIRE) ×2 IMPLANT

## 2017-04-21 NOTE — Interval H&P Note (Signed)
History and Physical Interval Note:  04/21/2017 10:39 AM  Kimberly Henry  has presented today for surgery, with the diagnosis of acute systolic chf  The various methods of treatment have been discussed with the patient and family. After consideration of risks, benefits and other options for treatment, the patient has consented to  Procedure(s): RIGHT/LEFT HEART CATH AND CORONARY ANGIOGRAPHY (N/A) as a surgical intervention .  The patient's history has been reviewed, patient examined, no change in status, stable for surgery.  I have reviewed the patient's chart and labs.  Questions were answered to the patient's satisfaction.     Kathlyn [place]

## 2017-04-21 NOTE — H&P (View-Only) (Signed)
Patient Name: Kimberly Henry Date of Encounter: 04/20/2017  Primary Cardiologist: [facility] Problem List     Principal Problem:   Acute CHF (congestive heart failure) ([place]) Active Problems:   Elevated troponin   Hypokalemia   AKI (acute kidney injury) ([place])   Dyslipidemia   Hyperglycemia     Subjective   Breathing improved. Documented UOP of 350 mL, patient reports voiding in the toilet and flushing. Renal function improving with diuresis. Less orthopneic overnight. Remains on supplemental oxygen. Echo showed EF 30-35%, probable HK of the anteroseptal myocardium, DD, mild MR, mildly dilated RV, moderately dilated LA, mildly dilated RA, moderate TR, moderately elevated PASP.   Inpatient Medications    Scheduled Meds: . aspirin  81 mg Oral Pre-Cath  . aspirin EC  81 mg Oral Daily  . atorvastatin  40 mg Oral Daily  . carvedilol  3.125 mg Oral BID WC  . enoxaparin (LOVENOX) injection  40 mg Subcutaneous Q12H  . fluticasone  2 spray Each Nare Daily  . lisinopril  20 mg Oral Daily  . loratadine  10 mg Oral QPM  . mometasone-formoterol  2 puff Inhalation BID  . potassium chloride  40 mEq Oral Q4H  . sodium chloride flush  3 mL Intravenous Q12H  . sodium chloride flush  3 mL Intravenous Q12H   Continuous Infusions: . sodium chloride    . sodium chloride    . sodium chloride     PRN Meds: sodium chloride, sodium chloride, acetaminophen **OR** acetaminophen, ondansetron **OR** ondansetron (ZOFRAN) IV, oxyCODONE, senna-docusate, sodium chloride flush, sodium chloride flush   Vital Signs    Vitals:   04/19/17 1030 04/19/17 2001 04/20/17 0124 04/20/17 0416  BP: 136/86 133/79  (!) 118/56  Pulse: 88 84  84  Resp: 20 18  18   Temp: 97.6 F (36.4 C) 98 F (36.7 C)  98 F (36.7 C)  TempSrc: Oral Oral    SpO2: 98% 96%  94%  Weight:   269 lb 1.6 oz (122.1 kg)     Intake/Output Summary (Last 24 hours) at 04/20/17 0856 Last data filed at 04/20/17 0527  Gross per 24  hour  Intake              480 ml  Output              850 ml  Net             -370 ml   Filed Weights   04/19/17 0646 04/20/17 0124  Weight: 269 lb (122 kg) 269 lb 1.6 oz (122.1 kg)    Physical Exam    GEN: Well nourished, well developed, in no acute distress.  HEENT: Grossly normal.  Neck: Supple, JVD elevated ~ 6 cm, no carotid bruits, or masses. Cardiac: RRR, no murmurs, rubs, or gallops. No clubbing, cyanosis, trace pre-tibial edema bilaterally.  Radials/DP/PT 2+ and equal bilaterally.  Respiratory:  Improved breath sounds bilaterally. GI: Soft, nontender, nondistended, BS + x 4. MS: no deformity or atrophy. Skin: warm and dry, no rash. Neuro:  Strength and sensation are intact. Psych: AAOx3.  Normal affect.  Labs    CBC  Recent Labs  04/19/17 0701 04/20/17 0324  WBC 5.2 5.8  HGB 11.4* 11.4*  HCT 33.9* 34.1*  MCV 89.2 89.9  PLT 261 825   Basic Metabolic Panel  Recent Labs  04/19/17 0701 04/19/17 1917 04/20/17 0324  NA 144  --  146*  K 2.7* 3.4* 3.5  CL  106  --  106  CO2 27  --  30  GLUCOSE 126*  --  118*  BUN 18  --  20  CREATININE 1.03*  --  0.90  CALCIUM 9.2  --  9.1  MG 1.8 1.8 2.2   Liver Function Tests  Recent Labs  04/19/17 0701  AST 32  ALT 34  ALKPHOS 111  BILITOT 2.0*  PROT 7.1  ALBUMIN 4.1   No results for input(s): LIPASE, AMYLASE in the last 72 hours. Cardiac Enzymes  Recent Labs  04/19/17 0701  TROPONINI 0.05*   BNP Invalid input(s): POCBNP D-Dimer No results for input(s): DDIMER in the last 72 hours. Hemoglobin A1C No results for input(s): HGBA1C in the last 72 hours. Fasting Lipid Panel No results for input(s): CHOL, HDL, LDLCALC, TRIG, CHOLHDL, LDLDIRECT in the last 72 hours. Thyroid Function Tests  Recent Labs  04/19/17 0701  TSH 8.043*    Telemetry    NSR, PVCs, short run of NSVT PM of 8/7 - Personally Reviewed  ECG    n/a - Personally Reviewed  Radiology    Dg Chest Portable 1 View  Result  Date: 04/19/2017 IMPRESSION: Cardiomegaly and mild pulmonary vascular congestion with small right pleural effusion. Degree of fluid overload is improved from prior study. Electronically Signed   By: Titus Dubin M.D.   On: 04/19/2017 07:34    Cardiac Studies   TTE 04/19/17: Study Conclusions  - Left ventricle: The cavity size was at the upper limits of   normal. Wall thickness was increased in a pattern of mild LVH.   Systolic function was moderately to severely reduced. The   estimated ejection fraction was in the range of 30% to 35%.   Probable hypokinesis of the anteroseptal myocardium. Doppler   parameters are consistent with restrictive physiology, indicative   of decreased left ventricular diastolic compliance and/or   increased left atrial pressure. Doppler parameters are consistent   with high ventricular filling pressure. - Aortic valve: Trileaflet; mildly thickened, mildly calcified   leaflets. Transvalvular velocity was within the normal range.   There was no stenosis. - Mitral valve: Calcified annulus. Mildly thickened leaflets .   There was mild regurgitation. - Left atrium: The atrium was moderately dilated. - Right ventricle: The cavity size was mildly dilated. Systolic   function was mildly reduced. - Right atrium: The atrium was mildly dilated. - Tricuspid valve: There was moderate regurgitation. - Pulmonary arteries: Systolic pressure was mildly to moderately   increased.  Patient Profile     76 y.o. female with history of essential hypertension, obesity, COPD secondary to previous tobacco abuse, hyperlipidemia, and osteoarthritis who was admitted to [facility] on 8/7 with worsening shortness of breath. She was found to have acute combined CHF and pulmonary hypertension.   Assessment & Plan    1. Acute combined CHF/pulmonary hypertension: -Improving -Documented UOP of 350 mL, patient reports significant more UOP, has been voiding in the toilet and flushing -Strict  Is and Os, daily standing weights -Continue IV diuresis today -R/LHC on 8/9 -TTE as above -Titrate Coreg -Continue lisinopril -Prior to discharge recommend adding spironolactone  -Risks and benefits of cardiac catheterization have been discussed with the patient including risks of bleeding, bruising, infection, kidney damage, stroke, heart attack, and death. The patient understands these risks and is willing to proceed with the procedure. All questions have been answered and concerns listened to  2. Elevated troponin: -Mildly elevated with a peak of 0.05 -Likely supply demand ischemia  in the setting of volume overload -Cath as above  3. NSVT: -Asymptomatic -Titrate Coreg -Magnesium at goal -TSH elevated -Recommend thyroid studies, defer to IM  4. AKI: -Improving with diuresis  5. Hypokalemia: -Replete to goal 4.0  6. HTN: -Controlled  Signed, Christell Faith, PA-C [facility] Pager: [phone] 04/20/2017, 8:56 AM

## 2017-04-21 NOTE — Progress Notes (Signed)
[place] at [place] NAME: Staley Budzinski    MR#:  [id number]  DATE OF BIRTH:  Dec 22, 1940  SUBJECTIVE:   Patient without chest pain or shortness of breath this morning. She is going for cardiac catheterization today. Family bedside. REVIEW OF SYSTEMS:    Review of Systems  Constitutional: Negative for fever, chills weight loss HENT: Negative for ear pain, nosebleeds, congestion, facial swelling, rhinorrhea, neck pain, neck stiffness and ear discharge.   Respiratory: Negative for cough, shortness of breath, wheezing  Cardiovascular: Negative for chest pain, palpitations and leg swelling.  Gastrointestinal: Negative for heartburn, abdominal pain, vomiting, diarrhea or consitpation Genitourinary: Negative for dysuria, urgency, frequency, hematuria Musculoskeletal: Negative for back pain or joint pain Neurological: Negative for dizziness, seizures, syncope, focal weakness,  numbness and headaches.  Hematological: Does not bruise/bleed easily.  Psychiatric/Behavioral: Negative for hallucinations, confusion, dysphoric mood    Tolerating Diet: Nothing by mouth     DRUG ALLERGIES:   Allergies  Allergen Reactions  . Lovastatin     Other reaction(s): Muscle Pain    VITALS:  Blood pressure (!) 123/59, pulse 85, temperature 98.5 F (36.9 C), temperature source Oral, resp. rate 18, weight 121.7 kg (268 lb 4.8 oz), SpO2 97 %.  PHYSICAL EXAMINATION:  Constitutional: Appears well-developed and well-nourished. No distress. HENT: Normocephalic. Marland Kitchen Oropharynx is clear and moist.  Eyes: Conjunctivae and EOM are normal. PERRLA, no scleral icterus.  Neck: Normal ROM. Neck supple. No JVD. No tracheal deviation. CVS: RRR, S1/S2 +, no murmurs, no gallops, no carotid bruit.  Pulmonary: Effort and breath sounds normal, no stridor, rhonchi, wheezes, rales.  Abdominal: Soft. BS +,  no distension, tenderness, rebound or guarding.  Musculoskeletal: Normal  range of motion. No edema and no tenderness.  Neuro: Alert. CN 2-12 grossly intact. No focal deficits. Skin: Skin is warm and dry. No rash noted. Psychiatric: Normal mood and affect.      LABORATORY PANEL:   CBC  Recent Labs Lab 04/20/17 0324  WBC 5.8  HGB 11.4*  HCT 34.1*  PLT 252   ------------------------------------------------------------------------------------------------------------------  Chemistries   Recent Labs Lab 04/19/17 0701  04/20/17 0324  04/21/17 0531  NA 144  --  146*  --  146*  K 2.7*  < > 3.5  < > 4.1  CL 106  --  106  --  109  CO2 27  --  30  --  29  GLUCOSE 126*  --  118*  --  130*  BUN 18  --  20  --  22*  CREATININE 1.03*  --  0.90  --  0.89  CALCIUM 9.2  --  9.1  --  9.7  MG 1.8  < > 2.2  --   --   AST 32  --   --   --   --   ALT 34  --   --   --   --   ALKPHOS 111  --   --   --   --   BILITOT 2.0*  --   --   --   --   < > = values in this interval not displayed. ------------------------------------------------------------------------------------------------------------------  Cardiac Enzymes  Recent Labs Lab 04/15/17 1249 04/15/17 1653 04/19/17 0701  TROPONINI 0.04* 0.04* 0.05*   ------------------------------------------------------------------------------------------------------------------  RADIOLOGY:  No results found.   ASSESSMENT AND PLAN:    76 year old female with history of essential hypertension, COPD and hyperlipidemia who presented with increasing shortness  of breath.  1. Acute combined systolic and diastolic heart failure with ejection fraction 30-35% and pulmonary hypertension: Patient symptoms have improved. Continue IV diuresis with Lasix As per recommendations by cardiology with plans to transition to oral later this afternoon or tomorrow Monitor intake and output Continue lisinopril and Coreg Patient will undergo cardiac catheterization due to new diagnosis of decreased ejection fraction  today.    2. Elevated troponin: This is due to demand ischemia in the setting of volume overload Cardiac catheterization planned   3. Non-SVT: Continue Coreg Keep K>4 and MG>2  4. Elevated TSH: Free T3 is normal free T4 is slightly low Would recommend starting Synthroid 25 g Repeat thyroid testing 6 weeks   5. Essential hypertension: Continue lisinopril and Coreg  6. COPD without exacerbation  Management plans discussed with the patient and she is in agreement.  CODE STATUS: full  TOTAL TIME TAKING CARE OF THIS PATIENT: 28 minutes.    POSSIBLE D/C friday, DEPENDING ON cardiac catheterization  Lun Muro M.D on 04/21/2017 at 8:58 AM  Between 7am to 6pm - Pager - [phone] After 6pm go to [email or web address] - password [email or web address] [facility]  Office  [phone]  CC: Primary care physician; Steele Sizer, MD  Note: This dictation was prepared with Dragon dictation along with smaller phrase technology. Any transcriptional errors that result from this process are unintentional.

## 2017-04-21 NOTE — Progress Notes (Signed)
Patient returned to the unit from special, remains alert and oriented, denies any pain at this time, vss, right radial vascualr site dry intact no hematoma no bleeding noted

## 2017-04-21 NOTE — Progress Notes (Signed)
[place] for Electrolyte Monitoring and Replacement  Allergies  Allergen Reactions  . Lovastatin     Other reaction(s): Muscle Pain    Patient Measurements: Weight: 268 lb 4.8 oz (121.7 kg)   Vital Signs: Temp: 98.4 F (36.9 C) (08/09 0531) Temp Source: Oral (08/09 0531) BP: 141/72 (08/09 0531) Pulse Rate: 86 (08/09 0531)  Labs:  Recent Labs  04/19/17 0701 04/19/17 1917 04/20/17 0324 04/21/17 0531  WBC 5.2  --  5.8  --   HGB 11.4*  --  11.4*  --   HCT 33.9*  --  34.1*  --   PLT 261  --  252  --   CREATININE 1.03*  --  0.90 0.89  MG 1.8 1.8 2.2  --   ALBUMIN 4.1  --   --   --   PROT 7.1  --   --   --   AST 32  --   --   --   ALT 34  --   --   --   ALKPHOS 111  --   --   --   BILITOT 2.0*  --   --   --    Lab Results  Component Value Date   K 4.1 04/21/2017    Estimated Creatinine Clearance: 71.6 mL/min (by C-G formula based on SCr of 0.89 mg/dL).  Assessment: 76 yo female with SOB and CHF. Pharmacy consulted for electrolyte monitoring and replacement.   Goal of Therapy:  K+: 4-5.1 mmol/L Mg: 2-2.4 mg/dL   Plan:  8/9 K+ 4.1.  Note: Patient on Lasix 40mg  IV Q12h. Will give KCL 20 MEQ PO x 1 with evening dose of lasix.  Patient may need scheduled KCL while on lasix. Follow up with AM labs and continue to replace as needed.   Noralee Space, PharmD, BCPS Clinical Pharmacist 04/21/2017 7:20 AM

## 2017-04-21 NOTE — OR Nursing (Signed)
Report called to [place], RN for pt in 257. Medications, vital signs, and orders were reviewed. The TR band was emptied at 1245 with no bleeding or hematoma at the site, no bleeding from the right brachial site. Will dress the right radial with guaze and tegaderm when the TR band is removed at 1315.

## 2017-04-21 NOTE — Progress Notes (Signed)
Patient off the floor to specials  

## 2017-04-22 ENCOUNTER — Ambulatory Visit: Admission: RE | Admit: 2017-04-22 | Payer: Medicare Other | Source: Ambulatory Visit | Admitting: Cardiovascular Disease

## 2017-04-22 ENCOUNTER — Encounter: Admission: RE | Payer: Self-pay | Source: Ambulatory Visit

## 2017-04-22 LAB — BASIC METABOLIC PANEL
Anion gap: 9 (ref 5–15)
BUN: 19 mg/dL (ref 6–20)
CALCIUM: 9.3 mg/dL (ref 8.9–10.3)
CO2: 30 mmol/L (ref 22–32)
CREATININE: 1.02 mg/dL — AB (ref 0.44–1.00)
Chloride: 106 mmol/L (ref 101–111)
GFR calc Af Amer: >60 mL/min (ref >60)
GFR calc non Af Amer: 52 mL/min — ABNORMAL LOW (ref >60)
GLUCOSE: 107 mg/dL — AB (ref 65–99)
Potassium: 3.7 mmol/L (ref 3.5–5.1)
Sodium: 145 mmol/L (ref 135–145)

## 2017-04-22 LAB — CBC
HCT: 34.7 % — ABNORMAL LOW (ref 35.0–47.0)
Hemoglobin: 11.6 g/dL — ABNORMAL LOW (ref 12.0–16.0)
MCH: 30.1 pg (ref 26.0–34.0)
MCHC: 33.3 g/dL (ref 32.0–36.0)
MCV: 90.4 fL (ref 80.0–100.0)
PLATELETS: 258 10*3/uL (ref 150–440)
RBC: 3.84 MIL/uL (ref 3.80–5.20)
RDW: 14.3 % (ref 11.5–14.5)
WBC: 5.7 10*3/uL (ref 3.6–11.0)

## 2017-04-22 SURGERY — LEFT HEART CATH AND CORONARY ANGIOGRAPHY
Anesthesia: Moderate Sedation

## 2017-04-22 MED ORDER — LOSARTAN POTASSIUM 50 MG PO TABS
50.0000 mg | ORAL_TABLET | Freq: Every day | ORAL | Status: DC
  Administered 2017-04-23: 50 mg via ORAL
  Filled 2017-04-22: qty 1

## 2017-04-22 MED ORDER — POTASSIUM CHLORIDE CRYS ER 20 MEQ PO TBCR
40.0000 meq | EXTENDED_RELEASE_TABLET | Freq: Every day | ORAL | Status: DC
  Administered 2017-04-22: 40 meq via ORAL
  Filled 2017-04-22: qty 2

## 2017-04-22 NOTE — Care Management Important Message (Signed)
Important Message  Patient Details  Name: Kimberly Henry MRN: [id number] Date of Birth: Aug 02, 1941   Medicare Important Message Given:  Yes Signed IM notice given    Katrina Stack, RN 04/22/2017, 9:48 AM

## 2017-04-22 NOTE — Plan of Care (Signed)
Problem: Food- and Nutrition-Related Knowledge Deficit (NB-1.1) Goal: Nutrition education Formal process to instruct or train a patient/client in a skill or to impart knowledge to help patients/clients voluntarily manage or modify food choices and eating behavior to maintain or improve health.  Outcome: Adequate for Discharge Nutrition Education Note  RD consulted for nutrition education regarding new onset CHF.  RD provided "Low Sodium Nutrition Therapy" handout from the Academy of [employer]etics. Reviewed patient's dietary recall. Provided examples on ways to decrease sodium intake in diet. Discouraged intake of processed foods and use of salt shaker. Encouraged fresh fruits and vegetables as well as whole grain sources of carbohydrates to maximize fiber intake.   RD discussed why it is important for patient to adhere to diet recommendations, and emphasized the role of fluids, foods to avoid, and importance of weighing self daily. Teach back method used.  Expect fair compliance.  Body mass index is 40.95 kg/m. Pt meets criteria for obese class III based on current BMI.  Current diet order is heart healthy, patient is consuming approximately 65-100% of meals at this time. Labs and medications reviewed. No further nutrition interventions warranted at this time. RD contact information provided. If additional nutrition issues arise, please re-consult RD.   Satira Anis. Renata Gambino, MS, RD LDN Inpatient Clinical Dietitian Pager [phone]

## 2017-04-22 NOTE — Consult Note (Signed)
Patient admitted with diagnosis of Acute CHF.  EF is 25-35% per cath on 04/22/2017.  Patient already had booklet "Living Better with Heart Failure."  Patient stated she has been reading the booklet.  Reviewed with patient what heart failure is and what EF means.  Explained normal EF and what her EF was measured to be during the cath.  She understands she will need to weigh herself every morning, assess her symptoms, and follow a low sodium diet. Patient stated she has not seen a dietitian this admission, but would like to do so.  I informed her I would enter an order - Consult for the Dietitian - to tlak with her about her low sodium heart healthy diet.  Discussed Cardiac Rehab with patient.  Explained the purpose of Cardiac Rehab as well as the guidelines for heart failure patients to start Cardiac Rehab six week post discharge to home and stable on home medications for HF for 6 weeks.  Patient very interested in Cardiac Rehab Phase II program and would like nurse to enter order for Cardiac Rehab Department to contact her in 4 weeks to set up an appointment to get started.  Patient stated, "I need this."    11:45 a.m. - 12:20 p.m.    Roanna Epley, RN, BSN, [facility] Cardiovascular and Pulmonary Nurse Navigator

## 2017-04-22 NOTE — Progress Notes (Signed)
[place] for Electrolyte Monitoring and Replacement  Allergies  Allergen Reactions  . Lovastatin     Other reaction(s): Muscle Pain    Patient Measurements: Height: 5\' 6"  (167.6 cm) Weight: 253 lb 11.2 oz (115.1 kg) IBW/kg (Calculated) : 59.3   Vital Signs: Temp: 98.2 F (36.8 C) (08/10 0323) Temp Source: Oral (08/10 0323) BP: 148/81 (08/10 0323) Pulse Rate: 111 (08/10 0323)  Labs:  Recent Labs  04/19/17 1917 04/20/17 0324 04/21/17 0531 04/22/17 0633  WBC  --  5.8  --  5.7  HGB  --  11.4*  --  11.6*  HCT  --  34.1*  --  34.7*  PLT  --  252  --  258  CREATININE  --  0.90 0.89 1.02*  MG 1.8 2.2  --   --    Lab Results  Component Value Date   K 3.7 04/22/2017    Estimated Creatinine Clearance: 60.4 mL/min (A) (by C-G formula based on SCr of 1.02 mg/dL (H)).  Assessment: 76 yo female with SOB and CHF. Pharmacy consulted for electrolyte monitoring and replacement.   Goal of Therapy:  K+: 4-5.1 mmol/L Mg: 2-2.4 mg/dL   Plan:  8/10 K =3.7 Pt is currently on lasix IV 60 q 12hr Pt was started on spironolactone yesterday afternoon Goal K >4 Will start KCL 40 MEQ po daily Continue to monitor, may be able to decrease to 20 daily with the addition of spironolactone and when/if lasix dose is decreased. K and Mg level in the AM   Ramond Dial, PharmD, BCPS Clinical Pharmacist 04/22/2017 7:31 AM

## 2017-04-22 NOTE — Progress Notes (Signed)
[place] at [place] NAME: Shara Hartis    MR#:  [id number]  DATE OF BIRTH:  Mar 23, 1941  SUBJECTIVE:   Patient has no chest pain or shortness of breath. REVIEW OF SYSTEMS:    Review of Systems  Constitutional: Negative for fever, chills weight loss HENT: Negative for ear pain, nosebleeds, congestion, facial swelling, rhinorrhea, neck pain, neck stiffness and ear discharge.   Respiratory: Negative for cough, shortness of breath, wheezing  Cardiovascular: Negative for chest pain, palpitations and leg swelling.  Gastrointestinal: Negative for heartburn, abdominal pain, vomiting, diarrhea or consitpation Genitourinary: Negative for dysuria, urgency, frequency, hematuria Musculoskeletal: Negative for back pain or joint pain Neurological: Negative for dizziness, seizures, syncope, focal weakness,  numbness and headaches.  Hematological: Does not bruise/bleed easily.  Psychiatric/Behavioral: Negative for hallucinations, confusion, dysphoric mood    Tolerating Diet: Nothing by mouth     DRUG ALLERGIES:   Allergies  Allergen Reactions  . Lovastatin     Other reaction(s): Muscle Pain    VITALS:  Blood pressure (!) 121/56, pulse (!) 111, temperature 98.3 F (36.8 C), temperature source Oral, resp. rate 18, height 5\' 6"  (1.676 m), weight 253 lb 11.2 oz (115.1 kg), SpO2 98 %.  PHYSICAL EXAMINATION:  Constitutional: Appears well-developed and well-nourished. No distress. HENT: Normocephalic. Marland Kitchen Oropharynx is clear and moist.  Eyes: Conjunctivae and EOM are normal. PERRLA, no scleral icterus.  Neck: Normal ROM. Neck supple. No JVD. No tracheal deviation. CVS: RRR, S1/S2 +, no murmurs, no gallops, no carotid bruit.  Pulmonary: Effort and breath sounds normal, no stridor, rhonchi, wheezes, rales.  Abdominal: Soft. BS +,  no distension, tenderness, rebound or guarding.  Musculoskeletal: Normal range of motion. No edema and no tenderness.   Neuro: Alert. CN 2-12 grossly intact. No focal deficits. Skin: Skin is warm and dry. No rash noted. Psychiatric: Normal mood and affect.  LABORATORY PANEL:   CBC  Recent Labs Lab 04/22/17 0633  WBC 5.7  HGB 11.6*  HCT 34.7*  PLT 258   ------------------------------------------------------------------------------------------------------------------  Chemistries   Recent Labs Lab 04/19/17 0701  04/20/17 0324  04/22/17 0633  NA 144  --  146*  < > 145  K 2.7*  < > 3.5  < > 3.7  CL 106  --  106  < > 106  CO2 27  --  30  < > 30  GLUCOSE 126*  --  118*  < > 107*  BUN 18  --  20  < > 19  CREATININE 1.03*  --  0.90  < > 1.02*  CALCIUM 9.2  --  9.1  < > 9.3  MG 1.8  < > 2.2  --   --   AST 32  --   --   --   --   ALT 34  --   --   --   --   ALKPHOS 111  --   --   --   --   BILITOT 2.0*  --   --   --   --   < > = values in this interval not displayed. ------------------------------------------------------------------------------------------------------------------  Cardiac Enzymes  Recent Labs Lab 04/15/17 1653 04/19/17 0701  TROPONINI 0.04* 0.05*   ------------------------------------------------------------------------------------------------------------------  RADIOLOGY:  No results found.   ASSESSMENT AND PLAN:    76 year old female with history of essential hypertension, COPD and hyperlipidemia who presented with increasing shortness of breath.  1. Acute combined systolic and diastolic heart failure  with ejection fraction 30-35% and pulmonary hypertension: Patient symptoms have improved. increased furosemide to 60 mg IV twice daily and added digoxin and spiranolactone per Dr. Fletcher Anon. Discontinue lisinopril, transition to losartan so that we can get her on a path toward entresto.per cardiologist. continue Coreg Per cardiac catheterization  1. Mild nonobstructive coronary artery disease. 2. Moderately to severely reduced LV systolic function with an EF of  25-35%.  Severe PAH: outpt sleep eval.  2. Elevated troponin: This is due to demand ischemia in the setting of volume overload  3. Non-SVT: Continue Coreg, give KCl. Keep K>4 and MG>2.  4. Elevated TSH: Free T3 is normal free T4 is slightly low Would recommend starting Synthroid 25 g Repeat thyroid testing 6 weeks   5. Essential hypertension: Continue Lasix, losartan, spironolactone and Coreg  6. COPD without exacerbation  Management plans discussed with the patient and she is in agreement.  CODE STATUS: full  TOTAL TIME TAKING CARE OF THIS PATIENT: 28 minutes.    POSSIBLE D/C friday, DEPENDING ON cardiac catheterization  Demetrios Loll M.D on 04/22/2017 at 3:10 PM  Between 7am to 6pm - Pager - [phone] After 6pm go to [email or web address] - password [email or web address] [facility]  Office  [phone]  CC: Primary care physician; Steele Sizer, MD  Note: This dictation was prepared with Dragon dictation along with smaller phrase technology. Any transcriptional errors that result from this process are unintentional.

## 2017-04-22 NOTE — Progress Notes (Signed)
Progress Note  Patient Name: Kimberly Henry Date of Encounter: 04/22/2017  Primary Cardiologist: Jerilynn Mages. Fletcher Anon, MD   Subjective   Breathing improving.  Wt down to 253 (stand up scale - priors were on bedscale).  No chest pain.  Inpatient Medications    Scheduled Meds: . aspirin EC  81 mg Oral Daily  . atorvastatin  40 mg Oral Daily  . carvedilol  6.25 mg Oral BID WC  . digoxin  0.125 mg Oral Daily  . fluticasone  2 spray Each Nare Daily  . furosemide  60 mg Intravenous Q12H  . levothyroxine  25 mcg Oral QAC breakfast  . lisinopril  20 mg Oral Daily  . loratadine  10 mg Oral QPM  . mometasone-formoterol  2 puff Inhalation BID  . potassium chloride  40 mEq Oral Daily  . sodium chloride flush  3 mL Intravenous Q12H  . sodium chloride flush  3 mL Intravenous Q12H  . spironolactone  25 mg Oral Daily   Continuous Infusions: . sodium chloride    . sodium chloride     PRN Meds: sodium chloride, sodium chloride, acetaminophen **OR** acetaminophen, albuterol, ondansetron **OR** ondansetron (ZOFRAN) IV, oxyCODONE, senna-docusate, sodium chloride flush, sodium chloride flush   Vital Signs    Vitals:   04/21/17 1315 04/21/17 1736 04/21/17 1923 04/22/17 0323  BP: (!) 112/48 119/67 (!) 108/54 (!) 148/81  Pulse: 76 80 71 (!) 111  Resp: (!) 24  18 18   Temp:   98.5 F (36.9 C) 98.2 F (36.8 C)  TempSrc:   Oral Oral  SpO2: 97% 100% 99% 94%  Weight:    253 lb 11.2 oz (115.1 kg)  Height:        Intake/Output Summary (Last 24 hours) at 04/22/17 0920 Last data filed at 04/22/17 0327  Gross per 24 hour  Intake              360 ml  Output             1300 ml  Net             -940 ml   Filed Weights   04/21/17 0531 04/21/17 0956 04/22/17 0323  Weight: 268 lb 4.8 oz (121.7 kg) 268 lb (121.6 kg) 253 lb 11.2 oz (115.1 kg)    Physical Exam   GEN: Well nourished, well developed, in no acute distress.  HEENT: Grossly normal.  Neck: Supple, JVP ~ 12 cm, no carotid bruits, or  masses. Cardiac: RRR, no murmurs, rubs, or gallops. No clubbing, cyanosis, edema.  Radials/DP/PT 2+ and equal bilaterally. R radial and brachial cath sites w/o bleeding/bruith/hematoma. Respiratory:  Respirations regular and unlabored, clear to auscultation bilaterally. GI: Obese, soft, nontender, nondistended, BS + x 4. MS: no deformity or atrophy. Skin: warm and dry, no rash. Neuro:  Strength and sensation are intact. Psych: AAOx3.  Normal affect.  Labs    Chemistry Recent Labs Lab 04/19/17 0701  04/20/17 0324 04/20/17 1749 04/21/17 0531 04/22/17 0633  NA 144  --  146*  --  146* 145  K 2.7*  < > 3.5 3.9 4.1 3.7  CL 106  --  106  --  109 106  CO2 27  --  30  --  29 30  GLUCOSE 126*  --  118*  --  130* 107*  BUN 18  --  20  --  22* 19  CREATININE 1.03*  --  0.90  --  0.89 1.02*  CALCIUM 9.2  --  9.1  --  9.7 9.3  PROT 7.1  --   --   --   --   --   ALBUMIN 4.1  --   --   --   --   --   AST 32  --   --   --   --   --   ALT 34  --   --   --   --   --   ALKPHOS 111  --   --   --   --   --   BILITOT 2.0*  --   --   --   --   --   GFRNONAA 51*  --  >60  --  >60 52*  GFRAA 60*  --  >60  --  >60 >60  ANIONGAP 11  --  10  --  8 9  < > = values in this interval not displayed.   Hematology Recent Labs Lab 04/19/17 0701 04/20/17 0324 04/22/17 0633  WBC 5.2 5.8 5.7  RBC 3.79* 3.79* 3.84  HGB 11.4* 11.4* 11.6*  HCT 33.9* 34.1* 34.7*  MCV 89.2 89.9 90.4  MCH 30.1 30.1 30.1  MCHC 33.7 33.5 33.3  RDW 14.3 14.2 14.3  PLT 261 252 258    Cardiac Enzymes Recent Labs Lab 04/15/17 1249 04/15/17 1653 04/19/17 0701  TROPONINI 0.04* 0.04* 0.05*      BNP Recent Labs Lab 04/15/17 1249 04/19/17 0658  BNP 738.0* 1,000.0*      Radiology    No results found.  Telemetry    RSR - Personally Reviewed  Cardiac Studies   2D Echocardiogram 8.7.2018 Study Conclusions   - Left ventricle: The cavity size was at the upper limits of   normal. Wall thickness was increased in  a pattern of mild LVH.   Systolic function was moderately to severely reduced. The   estimated ejection fraction was in the range of 30% to 35%.   Probable hypokinesis of the anteroseptal myocardium. Doppler   parameters are consistent with restrictive physiology, indicative   of decreased left ventricular diastolic compliance and/or   increased left atrial pressure. Doppler parameters are consistent   with high ventricular filling pressure. - Aortic valve: Trileaflet; mildly thickened, mildly calcified   leaflets. Transvalvular velocity was within the normal range.   There was no stenosis. - Mitral valve: Calcified annulus. Mildly thickened leaflets .   There was mild regurgitation. - Left atrium: The atrium was moderately dilated. - Right ventricle: The cavity size was mildly dilated. Systolic   function was mildly reduced. - Right atrium: The atrium was mildly dilated. - Tricuspid valve: There was moderate regurgitation. - Pulmonary arteries: Systolic pressure was mildly to moderately   increased. _____________  Cardiac Catheterization 8.9.2018   There is moderate to severe left ventricular systolic dysfunction.  The left ventricular ejection fraction is 25-35% by visual estimate.  LV end diastolic pressure is moderately elevated.  Ost Cx to Prox Cx lesion, 30 %stenosed.   1. Mild nonobstructive coronary artery disease. 2. Moderately to severely reduced LV systolic function with an EF of 25-35%. 3. Right heart catheterization showed moderately elevated filling pressures, severe pulmonary hypertension and severely reduced cardiac output at 3.14 with a cardiac index of 1.38. Pulmonary Wedge pressure was 27 mmHg and PA pressure was 61/34 with a mean of 47 mmHg.   Patient Profile     76 y.o. female with history of essential hypertension, obesity, COPD secondary to previous tobacco abuse, hyperlipidemia,  and osteoarthritis who was admitted to [facility] on 8/7 with worsening shortness  of breath. She was found to have acute combined CHF and pulmonary hypertension. [place] on 8/9 revealed nonobs CAD with elevated filling presures and reduced CO/CI.  Assessment & Plan    1.  Acute combined syst/diast CHF:  Continues to improved.  Diuretic dosing advanced 8/9 due to elevated filling pressures on R heart cath.  Digoxin also added in setting of low CO.  She continues to have JVD.  Net negative 940 ml overnight.  Not clear what dry wt might be as she was previously much heavier - in 270's @ home for some time.  She was 253 on stand up scale this am.  Prior wts were bedscale.  Renal fxn stable.  Cont IV diuresis along with  blocker, digoxin, spiro.  I will switch acei to ARB so that we can get her on a path toward entresto.  2.  NSVT:  No further.  Cont  blocker.  3.  AKI:  Renal fxn stable.  4.  Hypertensive Heart Disease:  Stable.  5.  Severe PAH:  Will need outpt sleep eval.  Signed, Murray Hodgkins, NP  04/22/2017, 9:20 AM

## 2017-04-23 LAB — BASIC METABOLIC PANEL
Anion gap: 11 (ref 5–15)
BUN: 22 mg/dL — AB (ref 6–20)
CALCIUM: 9.9 mg/dL (ref 8.9–10.3)
CO2: 30 mmol/L (ref 22–32)
Chloride: 102 mmol/L (ref 101–111)
Creatinine, Ser: 1.01 mg/dL — ABNORMAL HIGH (ref 0.44–1.00)
GFR calc Af Amer: >60 mL/min (ref >60)
GFR, EST NON AFRICAN AMERICAN: 53 mL/min — AB (ref >60)
GLUCOSE: 117 mg/dL — AB (ref 65–99)
POTASSIUM: 3.5 mmol/L (ref 3.5–5.1)
Sodium: 143 mmol/L (ref 135–145)

## 2017-04-23 LAB — MAGNESIUM: Magnesium: 1.9 mg/dL (ref 1.7–2.4)

## 2017-04-23 MED ORDER — POTASSIUM CHLORIDE CRYS ER 20 MEQ PO TBCR: 40.0000 meq | EXTENDED_RELEASE_TABLET | Freq: Every day | ORAL | 1 refills | Status: DC

## 2017-04-23 MED ORDER — LOSARTAN POTASSIUM 50 MG PO TABS: 50.0000 mg | ORAL_TABLET | Freq: Every day | ORAL | 1 refills | Status: DC

## 2017-04-23 MED ORDER — DIGOXIN 125 MCG PO TABS: 0.1250 mg | ORAL_TABLET | Freq: Every day | ORAL | 1 refills | Status: DC

## 2017-04-23 MED ORDER — CARVEDILOL 6.25 MG PO TABS: 6.2500 mg | ORAL_TABLET | Freq: Two times a day (BID) | ORAL | 1 refills | Status: DC

## 2017-04-23 MED ORDER — FUROSEMIDE 40 MG PO TABS: 40.0000 mg | ORAL_TABLET | Freq: Two times a day (BID) | ORAL | 1 refills | Status: DC

## 2017-04-23 MED ORDER — SPIRONOLACTONE 25 MG PO TABS: 25.0000 mg | ORAL_TABLET | Freq: Every day | ORAL | 1 refills | Status: DC

## 2017-04-23 MED ORDER — POTASSIUM CHLORIDE CRYS ER 20 MEQ PO TBCR
40.0000 meq | EXTENDED_RELEASE_TABLET | Freq: Two times a day (BID) | ORAL | Status: DC
  Administered 2017-04-23: 40 meq via ORAL
  Filled 2017-04-23: qty 2

## 2017-04-23 NOTE — Progress Notes (Signed)
Discharge instructions reviewed with patient and family. Pt/family verbalized understanding. IV removed, dressing applied. Patient voices no concerns or questions at this time. NAD upon discharge.

## 2017-04-23 NOTE — Progress Notes (Signed)
Patient ID: Kimberly Henry, female   DOB: 03-25-1941, 76 y.o.   MRN: [id number]  Patient discussed with Dr Bridgett Larsson.  She wants to go home today.  Stable clinically.  Ok to send home on Lasix 40 mg po bid and KCl 40 daily with followup with Dr. Fletcher Anon.   Loralie Champagne 04/23/2017

## 2017-04-23 NOTE — Discharge Summary (Signed)
Kimberly Henry at [facility] NAME: Kimberly Henry    MR#:  [id number]  DATE OF BIRTH:  01-19-1941  DATE OF ADMISSION:  04/19/2017   ADMITTING PHYSICIAN: Bettey Costa, MD  DATE OF DISCHARGE:04/23/2017 PRIMARY CARE PHYSICIAN: Steele Sizer, MD   ADMISSION DIAGNOSIS:  Shortness of breath [R06.02] Congestive heart failure, unspecified HF chronicity, unspecified heart failure type ([place]) [I50.9] DISCHARGE DIAGNOSIS:  Principal Problem:   Acute heart failure ([place]) Active Problems:   Dyslipidemia   Hypokalemia   Elevated troponin   AKI (acute kidney injury) ([place])   Hyperglycemia  SECONDARY DIAGNOSIS:   Past Medical History:  Diagnosis Date  . Acid phosphatase elevated   . AV block, 1st degree   . CHF (congestive heart failure) ([place])   . COPD (chronic obstructive pulmonary disease) ([place])   . Hx of cervical malignancy   . Hyperlipidemia   . Hypertension   . Metabolic syndrome   . Obesity, Class III, BMI 40-49.9 (morbid obesity) ([place])   . Osteoarthritis of both knees    HOSPITAL COURSE:   76 year old female with history of essential hypertension, COPD and hyperlipidemia who presented with increasing shortness of breath.  She has no complaint and wants to go home. Off O2 . 1. Acute combined systolic and diastolic heart failure with ejection fraction 30-35% and pulmonary hypertension: Patient symptoms have improved. increased furosemide to 60 mg IV twice daily and added digoxin and spiranolactone per Dr. Fletcher Anon. Discontinued lisinopril, transition to losartan so that we can get her on a path toward entresto.per cardiologist. continue Coreg Per cardiac catheterization  1. Mild nonobstructive coronary artery disease. 2. Moderately to severely reduced LV systolic function with an EF of 25-35%. change to po lasix 40 mg bid, KCl 40 mEq po daily, patient is stable and can be discharged today per Dr. Aundra Dubin.  Severe PAH: outpt sleep eval.  2.  Elevated troponin: This is due to demand ischemia in the setting of volume overload  3. Non-SVT: Continue Coreg, give KCl. Keep K>4 and MG>2.  4. Elevated TSH: Free T3 is normal free T4 is slightly low Would recommend starting Synthroid 25 g Repeat thyroid testing 6 weeks   5. Essential hypertension: Continue Lasix, losartan, spironolactone and Coreg  6. COPD without exacerbation stable. I discussed with Dr. Aundra Dubin. DISCHARGE CONDITIONS:   CONSULTS OBTAINED:  Treatment Team:  Nelva Bush, MD DRUG ALLERGIES:   Allergies  Allergen Reactions  . Lovastatin     Other reaction(s): Muscle Pain   DISCHARGE MEDICATIONS:   Allergies as of 04/23/2017      Reactions   Lovastatin    Other reaction(s): Muscle Pain      Medication List    STOP taking these medications   lisinopril-hydrochlorothiazide 20-12.5 MG tablet Commonly known as:  PRINZIDE,ZESTORETIC   Potassium Chloride ER 20 MEQ Tbcr     TAKE these medications   acetaminophen 500 MG chewable tablet Commonly known as:  TYLENOL Chew 500 mg by mouth every 6 (six) hours as needed for pain.   aspirin EC 81 MG tablet Take 81 mg by mouth daily.   atorvastatin 40 MG tablet Commonly known as:  LIPITOR Take 1 tablet (40 mg total) by mouth daily.   budesonide-formoterol 160-4.5 MCG/ACT inhaler Commonly known as:  SYMBICORT Inhale 2 puffs into the lungs daily.   carvedilol 6.25 MG tablet Commonly known as:  COREG Take 1 tablet (6.25 mg total) by mouth 2 (two) times daily with a  meal.   digoxin 0.125 MG tablet Commonly known as:  LANOXIN Take 1 tablet (0.125 mg total) by mouth daily.   fluticasone 50 MCG/ACT nasal spray Commonly known as:  FLONASE USE 2 SPRAYS IN EACH  NOSTRIL DAILY   furosemide 40 MG tablet Commonly known as:  LASIX Take 1 tablet (40 mg total) by mouth 2 (two) times daily. What changed:  when to take this   levocetirizine 5 MG tablet Commonly known as:  XYZAL Take 1 tablet (5  mg total) by mouth every evening.   losartan 50 MG tablet Commonly known as:  COZAAR Take 1 tablet (50 mg total) by mouth daily.   potassium chloride SA 20 MEQ tablet Commonly known as:  K-DUR,KLOR-CON Take 2 tablets (40 mEq total) by mouth daily.   spironolactone 25 MG tablet Commonly known as:  ALDACTONE Take 1 tablet (25 mg total) by mouth daily.        DISCHARGE INSTRUCTIONS:  See AVS  If you experience worsening of your admission symptoms, develop shortness of breath, life threatening emergency, suicidal or homicidal thoughts you must seek medical attention immediately by calling 911 or calling your MD immediately  if symptoms less severe.  You Must read complete instructions/literature along with all the possible adverse reactions/side effects for all the Medicines you take and that have been prescribed to you. Take any new Medicines after you have completely understood and accpet all the possible adverse reactions/side effects.   Please note  You were cared for by a hospitalist during your hospital stay. If you have any questions about your discharge medications or the care you received while you were in the hospital after you are discharged, you can call the unit and asked to speak with the hospitalist on call if the hospitalist that took care of you is not available. Once you are discharged, your primary care physician will handle any further medical issues. Please note that NO REFILLS for any discharge medications will be authorized once you are discharged, as it is imperative that you return to your primary care physician (or establish a relationship with a primary care physician if you do not have one) for your aftercare needs so that they can reassess your need for medications and monitor your lab values.    On the day of Discharge:  VITAL SIGNS:  Blood pressure 117/73, pulse 78, temperature 98 F (36.7 C), temperature source Oral, resp. rate 18, height 5\' 6"  (1.676 m),  weight 247 lb 4.8 oz (112.2 kg), SpO2 95 %. PHYSICAL EXAMINATION:  GENERAL:  76 y.o.-year-old patient lying in the bed with no acute distress.  EYES: Pupils equal, round, reactive to light and accommodation. No scleral icterus. Extraocular muscles intact.  HEENT: Head atraumatic, normocephalic. Oropharynx and nasopharynx clear.  NECK:  Supple, no jugular venous distention. No thyroid enlargement, no tenderness.  LUNGS: Normal breath sounds bilaterally, no wheezing, rales,rhonchi or crepitation. No use of accessory muscles of respiration.  CARDIOVASCULAR: S1, S2 normal. No murmurs, rubs, or gallops.  ABDOMEN: Soft, non-tender, non-distended. Bowel sounds present. No organomegaly or mass.  EXTREMITIES: No pedal edema, cyanosis, or clubbing.  NEUROLOGIC: Cranial nerves II through XII are intact. Muscle strength 5/5 in all extremities. Sensation intact. Gait not checked.  PSYCHIATRIC: The patient is alert and oriented x 3.  SKIN: No obvious rash, lesion, or ulcer.  DATA REVIEW:   CBC  Recent Labs Lab 04/22/17 0633  WBC 5.7  HGB 11.6*  HCT 34.7*  PLT 258  Chemistries   Recent Labs Lab 04/19/17 0701  04/23/17 0508  NA 144  < > 143  K 2.7*  < > 3.5  CL 106  < > 102  CO2 27  < > 30  GLUCOSE 126*  < > 117*  BUN 18  < > 22*  CREATININE 1.03*  < > 1.01*  CALCIUM 9.2  < > 9.9  MG 1.8  < > 1.9  AST 32  --   --   ALT 34  --   --   ALKPHOS 111  --   --   BILITOT 2.0*  --   --   < > = values in this interval not displayed.   Microbiology Results  No results found for this or any previous visit.  RADIOLOGY:  No results found.   Management plans discussed with the patient, family and they are in agreement.  CODE STATUS: Full Code   TOTAL TIME TAKING CARE OF THIS PATIENT: 35 minutes.    Demetrios Loll M.D on 04/23/2017 at 1:16 PM  Between 7am to 6pm - Pager - [phone]  After 6pm go to [email or web address] - [employer] [facility]  Office   [phone]  CC: Primary care physician; Steele Sizer, MD   Note: This dictation was prepared with Dragon dictation along with smaller phrase technology. Any transcriptional errors that result from this process are unintentional.

## 2017-04-23 NOTE — Progress Notes (Signed)
Pt. Refuses bed alarm. Pt. Educated on risk of not using bed alarm. Pt. Continued to refuse bed alarm.

## 2017-04-23 NOTE — Progress Notes (Signed)
Pt refused Bipap.

## 2017-04-23 NOTE — Progress Notes (Signed)
Explained the importance of weighing daily and recording the weight on a log to the patient. Explained when she should contact her physician with weight increase and also importance of complying with medication regiment. Pt stated she understood education.

## 2017-04-23 NOTE — Plan of Care (Signed)
Problem: Education: Goal: Knowledge of [facility] Education information/materials will improve Outcome: Progressing Patient admitted for fluid overload. Plan of care includes continuous cardiopulmonary monitoring, PO lasix and all other health conditions will be monitored while in the hospital.  Problem: Safety: Goal: Ability to remain free from injury will improve Outcome: Progressing Patient will remain free from injury and falls during the shift. Call bell in reach, bed in lowest position, chair wheels locked.  Problem: Fluid Volume: Goal: Ability to maintain a balanced intake and output will improve Outcome: Progressing Patient remain hemodynamically stable while in the hospital and receiving lasix

## 2017-04-23 NOTE — Discharge Instructions (Signed)
Heart Failure Clinic appointment on April 28 2017 at 11:00am with Kimberly Henry, [place]. Please call [phone] to reschedule.  Heart healthy diet.

## 2017-04-23 NOTE — Progress Notes (Signed)
[place] for Electrolyte Monitoring and Replacement  Allergies  Allergen Reactions  . Lovastatin     Other reaction(s): Muscle Pain    Patient Measurements: Height: 5\' 6"  (167.6 cm) Weight: 247 lb 4.8 oz (112.2 kg) IBW/kg (Calculated) : 59.3   Vital Signs: Temp: 98.5 F (36.9 C) (08/11 0413) Temp Source: Oral (08/11 0413) BP: 111/54 (08/11 0413) Pulse Rate: 78 (08/11 0413)  Labs:  Recent Labs  04/21/17 0531 04/22/17 0633 04/23/17 0508  WBC  --  5.7  --   HGB  --  11.6*  --   HCT  --  34.7*  --   PLT  --  258  --   CREATININE 0.89 1.02* 1.01*  MG  --   --  1.9   Lab Results  Component Value Date   K 3.5 04/23/2017    Estimated Creatinine Clearance: 60.2 mL/min (A) (by C-G formula based on SCr of 1.01 mg/dL (H)).  Assessment: 76 yo female with SOB and CHF. Pharmacy consulted for electrolyte monitoring and replacement.   Goal of Therapy:  K+: 4-5.1 mmol/L Mg: 2-2.4 mg/dL   Plan:  8/11   K =3.5, Mag = 1.9 Pt is currently on lasix IV 60 q 12hr Pt was started on spironolactone yesterday afternoon Goal K >4  K level has decreased. Will order KCl 47meq PO BID.   K and Mg level in the AM   Olivia Canter, [place] Pharmacist 04/23/2017 7:29 AM

## 2017-04-26 ENCOUNTER — Ambulatory Visit: Payer: Medicare Other | Admitting: Cardiovascular Disease

## 2017-04-28 ENCOUNTER — Ambulatory Visit: Payer: Medicare Other | Attending: Family | Admitting: Family

## 2017-04-28 ENCOUNTER — Encounter: Payer: Self-pay | Admitting: Family

## 2017-04-28 VITALS — BP 110/45 | HR 67 | Resp 18 | Ht 66.0 in | Wt 248.2 lb

## 2017-04-28 DIAGNOSIS — Z79899 Other long term (current) drug therapy: Secondary | ICD-10-CM | POA: Diagnosis not present

## 2017-04-28 DIAGNOSIS — Z9889 Other specified postprocedural states: Secondary | ICD-10-CM | POA: Insufficient documentation

## 2017-04-28 DIAGNOSIS — Z87891 Personal history of nicotine dependence: Secondary | ICD-10-CM | POA: Insufficient documentation

## 2017-04-28 DIAGNOSIS — Z888 Allergy status to other drugs, medicaments and biological substances status: Secondary | ICD-10-CM | POA: Diagnosis not present

## 2017-04-28 DIAGNOSIS — I110 Hypertensive heart disease with heart failure: Secondary | ICD-10-CM | POA: Diagnosis present

## 2017-04-28 DIAGNOSIS — Z7982 Long term (current) use of aspirin: Secondary | ICD-10-CM | POA: Diagnosis not present

## 2017-04-28 DIAGNOSIS — I272 Pulmonary hypertension, unspecified: Secondary | ICD-10-CM | POA: Insufficient documentation

## 2017-04-28 DIAGNOSIS — I2510 Atherosclerotic heart disease of native coronary artery without angina pectoris: Secondary | ICD-10-CM | POA: Insufficient documentation

## 2017-04-28 DIAGNOSIS — I5022 Chronic systolic (congestive) heart failure: Secondary | ICD-10-CM | POA: Insufficient documentation

## 2017-04-28 DIAGNOSIS — I10 Essential (primary) hypertension: Secondary | ICD-10-CM

## 2017-04-28 DIAGNOSIS — E785 Hyperlipidemia, unspecified: Secondary | ICD-10-CM | POA: Insufficient documentation

## 2017-04-28 DIAGNOSIS — Z8249 Family history of ischemic heart disease and other diseases of the circulatory system: Secondary | ICD-10-CM | POA: Insufficient documentation

## 2017-04-28 DIAGNOSIS — J449 Chronic obstructive pulmonary disease, unspecified: Secondary | ICD-10-CM | POA: Diagnosis not present

## 2017-04-28 DIAGNOSIS — Z8541 Personal history of malignant neoplasm of cervix uteri: Secondary | ICD-10-CM | POA: Insufficient documentation

## 2017-04-28 NOTE — Patient Instructions (Addendum)
Continue weighing daily and call for an overnight weight gain of > 2 pounds or a weekly weight gain of >5 pounds.  You can drink 2 hospital cups of fluids daily.

## 2017-04-28 NOTE — Progress Notes (Signed)
Work note given

## 2017-04-28 NOTE — Progress Notes (Signed)
Patient ID: Kimberly Henry, female    DOB: 05-26-1941, 76 y.o.   MRN: [id number]  HPI  Kimberly Henry is a 76 y/o female with a history of AV block, COPD, hyperlipidemia, HTN, osteoarthritis, remote tobacco use and chronic heart failure.  Echo from 04/19/17 reviewed and shows an EF of 30-35% along with mild MR and moderate TR. Right/Left Cardiac catheterization was done 04/21/17 and showed mild nonobstructive CAD with an EF of 25-35%. Moderately elevated filling pressure, severe pulmonary HTN and severely reduced cardiac output at 3.14 with a cardiac index of 1.38. PA pressure was 61/34 with a mean of 47 mm Hg.   Admitted 04/19/17 due to heart failure. Initially needed IV diuretics with transition to oral diuretics. Cardiology consult obtained. Recommend outpatient sleep study. Elevated troponin thought to be due to demand ischemia. Discharged home after 4 days. Was in the ED 04/15/17 due to HF and she was treated/released.   She presents today for her initial visit with a chief complaint of mild fatigue with moderate exertion. She describes this as chronic in nature having been present for several months. She says that her energy level is improving since hospital discharge. She denies any shortness of breath, chest pain, edema or weight gain.   Past Medical History:  Diagnosis Date  . Acid phosphatase elevated   . AV block, 1st degree   . CHF (congestive heart failure) ([place])   . COPD (chronic obstructive pulmonary disease) ([place])   . Hx of cervical malignancy   . Hyperlipidemia   . Hypertension   . Metabolic syndrome   . Obesity, Class III, BMI 40-49.9 (morbid obesity) ([place])   . Osteoarthritis of both knees    Past Surgical History:  Procedure Laterality Date  . ABDOMINAL HYSTERECTOMY  1996  . CATARACT EXTRACTION Left 2008  . RIGHT/LEFT HEART CATH AND CORONARY ANGIOGRAPHY N/A 04/21/2017   Procedure: RIGHT/LEFT HEART CATH AND CORONARY ANGIOGRAPHY;  Surgeon: Wellington Hampshire, MD;  Location: [facility];  Service: Cardiovascular;  Laterality: N/A;   Family History  Problem Relation Age of Onset  . Hypertension Mother   . Congestive Heart Failure Mother   . Congestive Heart Failure Father    Social History  Substance Use Topics  . Smoking status: Former Smoker    Years: 10.00    Types: Cigarettes    Start date: 09/14/1975    Quit date: 06/22/1986  . Smokeless tobacco: Never Used  . Alcohol use No   Allergies  Allergen Reactions  . Lovastatin     Other reaction(s): Muscle Pain   Prior to Admission medications   Medication Sig Start Date End Date Taking? Authorizing Provider  aspirin EC 81 MG tablet Take 81 mg by mouth daily.   Yes [provider]  atorvastatin (LIPITOR) 40 MG tablet Take 1 tablet (40 mg total) by mouth daily. 03/15/17  Yes Sowles, Drue Stager, MD  budesonide-formoterol [facility]) 160-4.5 MCG/ACT inhaler Inhale 2 puffs into the lungs daily. 03/02/17  Yes Hubbard Hartshorn, FNP  carvedilol (COREG) 6.25 MG tablet Take 1 tablet (6.25 mg total) by mouth 2 (two) times daily with a meal. 04/23/17  Yes Demetrios Loll, MD  digoxin (LANOXIN) 0.125 MG tablet Take 1 tablet (0.125 mg total) by mouth daily. 04/24/17  Yes Demetrios Loll, MD  fluticasone [facility]) 50 MCG/ACT nasal spray USE 2 SPRAYS IN EACH  NOSTRIL DAILY 10/21/16  Yes Sowles, Drue Stager, MD  furosemide (LASIX) 40 MG tablet Take 1 tablet (40 mg total)  by mouth 2 (two) times daily. 04/23/17 04/23/18 Yes Demetrios Loll, MD  losartan (COZAAR) 50 MG tablet Take 1 tablet (50 mg total) by mouth daily. 04/24/17  Yes Demetrios Loll, MD  potassium chloride SA (K-DUR,KLOR-CON) 20 MEQ tablet Take 20 mEq by mouth daily.   Yes [provider]  spironolactone (ALDACTONE) 25 MG tablet Take 1 tablet (25 mg total) by mouth daily. 04/24/17  Yes Demetrios Loll, MD  acetaminophen (TYLENOL) 500 MG chewable tablet Chew 500 mg by mouth every 6 (six) hours as needed for pain.    [provider]  levocetirizine (XYZAL) 5 MG tablet Take 1  tablet (5 mg total) by mouth every evening. Patient not taking: Reported on 04/28/2017 03/02/17   Hubbard Hartshorn, FNP   Review of Systems  Constitutional: Positive for fatigue. Negative for appetite change.  HENT: Positive for postnasal drip. Negative for congestion and sore throat.   Eyes: Negative.   Respiratory: Negative for cough, chest tightness and shortness of breath.   Cardiovascular: Negative for chest pain, palpitations and leg swelling.  Gastrointestinal: Negative for abdominal distention and abdominal pain.  Endocrine: Negative.   Genitourinary: Negative.   Musculoskeletal: Negative for back pain and neck pain.  Skin: Negative.   Allergic/Immunologic: Negative.   Neurological: Negative for dizziness and light-headedness.  Hematological: Negative for adenopathy. Does not bruise/bleed easily.  Psychiatric/Behavioral: Negative for dysphoric mood and sleep disturbance (sleeping on 1-2 pillows). The patient is not nervous/anxious.    Vitals:   04/28/17 1123  BP: (!) 110/45  Pulse: 67  Resp: 18  SpO2: 100%  Weight: 248 lb 4 oz (112.6 kg)  Height: 5\' 6"  (1.676 m)   Wt Readings from Last 3 Encounters:  04/28/17 248 lb 4 oz (112.6 kg)  04/23/17 247 lb 4.8 oz (112.2 kg)  04/18/17 269 lb 8 oz (122.2 kg)   Lab Results  Component Value Date   CREATININE 1.01 (H) 04/23/2017   CREATININE 1.02 (H) 04/22/2017   CREATININE 0.89 04/21/2017   Physical Exam  Constitutional: She is oriented to person, place, and time. She appears well-developed and well-nourished.  HENT:  Head: Normocephalic and atraumatic.  Neck: Normal range of motion. Neck supple. No JVD present.  Cardiovascular: Normal rate and regular rhythm.   Pulmonary/Chest: Effort normal. She has no wheezes. She has no rales.  Abdominal: Soft. She exhibits no distension. There is no tenderness.  Musculoskeletal: She exhibits no edema or tenderness.  Neurological: She is alert and oriented to person, place, and time.   Skin: Skin is warm and dry.  Psychiatric: She has a normal mood and affect. Her behavior is normal. Thought content normal.  Nursing note and vitals reviewed.  Assessment & Plan:  1: Chronic heart failure with reduced ejection fraction- - NYHA class II - euvolemic today - already weighing daily. Instructed to call for an overnight weight gain of >2 pounds or a weekly weight gain of >5 pounds - not adding salt and has been reading food labels. Discussed the importance of closely following a 2000mg  sodium diet and written dietary information was given to her about this.  - does wear support socks  - will make a referral to [facility]  - work excuse given to her for the next month until she returns back here - consider changing to entresto if her BP allows - saw cardiologist Fletcher Anon) 04/18/17  2: HTN- - BP on the low side - denies dizziness  Medication bottles reviewed.  Return here in 1 month  or sooner for any questions/problems before then.

## 2017-05-09 ENCOUNTER — Ambulatory Visit (INDEPENDENT_AMBULATORY_CARE_PROVIDER_SITE_OTHER): Payer: Medicare Other | Admitting: Family Medicine

## 2017-05-09 ENCOUNTER — Encounter: Payer: Self-pay | Admitting: Family Medicine

## 2017-05-09 VITALS — BP 102/66 | HR 94 | Temp 98.3°F | Resp 16 | Ht 66.0 in | Wt 244.3 lb

## 2017-05-09 DIAGNOSIS — F1910 Other psychoactive substance abuse, uncomplicated: Secondary | ICD-10-CM | POA: Diagnosis not present

## 2017-05-09 DIAGNOSIS — E876 Hypokalemia: Secondary | ICD-10-CM | POA: Diagnosis not present

## 2017-05-09 DIAGNOSIS — Z09 Encounter for follow-up examination after completed treatment for conditions other than malignant neoplasm: Secondary | ICD-10-CM | POA: Diagnosis not present

## 2017-05-09 DIAGNOSIS — I5042 Chronic combined systolic (congestive) and diastolic (congestive) heart failure: Secondary | ICD-10-CM

## 2017-05-09 DIAGNOSIS — F1990 Other psychoactive substance use, unspecified, uncomplicated: Secondary | ICD-10-CM

## 2017-05-09 DIAGNOSIS — I509 Heart failure, unspecified: Secondary | ICD-10-CM | POA: Insufficient documentation

## 2017-05-09 NOTE — Progress Notes (Signed)
Name: Kimberly Henry   MRN: [id number]    DOB: [birth year]   Date:05/09/2017       Progress Note  Subjective  Chief Complaint  Chief Complaint  Patient presents with  . Hospitalization Follow-up    SOB  . Congestive Heart Failure    HPI  CHF/hospital discharge: she was admitted on 04/18/2017 and discharged 04/22/2017. She states she does not like using Symbicort and since hospital discharge she continued taking old bottle of furosemide ( once daily) and new bottle of Lasix with directions to take twice daily. She states since she only has SOB with moderate activity, but she still feels tired and weak and does not want to return to work yet. Explained that it has to be extended by cardiologist. She also would like to see Dr. Fletcher Anon ( cardiologist who saw her during hospital stay). She has mild cough, denies lower extremity edema, orthopnea, PND or chest pain. Echo showed EF 30-35% , increase in pulmonary pressure at 47 mm Hg. She is taking Spironolactone, digoxin, and valsartan as prescribed.    Patient Active Problem List   Diagnosis Date Noted  . CHF (congestive heart failure), NYHA class III ([place]) 05/09/2017  . Chronic systolic heart failure ([place]) 04/28/2017  . Hypokalemia 04/19/2017  . Elevated troponin 04/19/2017  . AKI (acute kidney injury) ([place]) 04/19/2017  . Hyperglycemia 04/19/2017  . Aortic atherosclerosis ([place]) 03/10/2017  . Anemia, unspecified 03/08/2017  . Cardiomegaly 03/04/2017  . Abnormal CXR 03/04/2017  . Perennial allergic rhinitis with seasonal variation 05/30/2015  . 1st degree AV block 03/25/2015  . Extreme obesity 03/25/2015  . Metabolic syndrome 00/86/7619  . History of cervical cancer 03/03/2015  . Hypertension, benign 03/03/2015  . Osteoarthritis, knee 03/03/2015  . Dyslipidemia 03/03/2015  . Failed, ovarian, postablative 11/21/2007    Past Surgical History:  Procedure Laterality Date  . ABDOMINAL HYSTERECTOMY  1996  . CATARACT EXTRACTION Left 2008   . RIGHT/LEFT HEART CATH AND CORONARY ANGIOGRAPHY N/A 04/21/2017   Procedure: RIGHT/LEFT HEART CATH AND CORONARY ANGIOGRAPHY;  Surgeon: Wellington Hampshire, MD;  Location: [facility];  Service: Cardiovascular;  Laterality: N/A;    Family History  Problem Relation Age of Onset  . Hypertension Mother   . Congestive Heart Failure Mother   . Congestive Heart Failure Father     Social History   Social History  . Marital status: Widowed    Spouse name: N/A  . Number of children: N/A  . Years of education: N/A   Occupational History  . Not on file.   Social History Main Topics  . Smoking status: Former Smoker    Years: 10.00    Types: Cigarettes    Start date: 09/14/1975    Quit date: 06/22/1986  . Smokeless tobacco: Never Used  . Alcohol use No  . Drug use: No  . Sexual activity: No   Other Topics Concern  . Not on file   Social History Narrative  . No narrative on file     Current Outpatient Prescriptions:  .  acetaminophen (TYLENOL) 500 MG chewable tablet, Chew 500 mg by mouth every 6 (six) hours as needed for pain., Disp: , Rfl:  .  aspirin EC 81 MG tablet, Take 81 mg by mouth daily., Disp: , Rfl:  .  carvedilol (COREG) 6.25 MG tablet, Take 1 tablet (6.25 mg total) by mouth 2 (two) times daily with a meal., Disp: 60 tablet, Rfl: 1 .  digoxin (LANOXIN) 0.125 MG tablet, Take  1 tablet (0.125 mg total) by mouth daily., Disp: 30 tablet, Rfl: 1 .  fluticasone (FLONASE) 50 MCG/ACT nasal spray, USE 2 SPRAYS IN EACH  NOSTRIL DAILY, Disp: 48 g, Rfl: 0 .  furosemide (LASIX) 40 MG tablet, Take 1 tablet (40 mg total) by mouth 2 (two) times daily., Disp: 60 tablet, Rfl: 1 .  losartan (COZAAR) 50 MG tablet, Take 1 tablet (50 mg total) by mouth daily., Disp: 30 tablet, Rfl: 1 .  potassium chloride SA (K-DUR,KLOR-CON) 20 MEQ tablet, Take 40 mEq by mouth daily. , Disp: , Rfl:  .  spironolactone (ALDACTONE) 25 MG tablet, Take 1 tablet (25 mg total) by mouth daily., Disp: 30 tablet,  Rfl: 1 .  atorvastatin (LIPITOR) 40 MG tablet, Take 1 tablet (40 mg total) by mouth daily., Disp: 90 tablet, Rfl: 3  Allergies  Allergen Reactions  . Lovastatin     Other reaction(s): Muscle Pain     ROS  Ten systems reviewed and is negative except as mentioned in HPI   Objective  Vitals:   05/09/17 1325  BP: 102/66  Pulse: 94  Resp: 16  Temp: 98.3 F (36.8 C)  TempSrc: Oral  SpO2: 98%  Weight: 244 lb 4.8 oz (110.8 kg)  Height: '5\' 6"'  (1.676 m)    Body mass index is 39.43 kg/m.  Physical Exam  Constitutional: Patient appears well-developed and well-nourished. Obese  No distress.  HEENT: head atraumatic, normocephalic, pupils equal and reactive to light,  neck supple, throat within normal limits Cardiovascular: Normal rate, regular rhythm and normal heart sounds.  No murmur heard. No BLE edema. Pulmonary/Chest: Effort normal and breath sounds normal. No respiratory distress. Abdominal: Soft.  There is no tenderness. Psychiatric: Patient has a normal mood and affect. behavior is normal. Judgment and thought content normal. Muscular Skeletal: using a cane  Recent Results (from the past 2160 hour(s))  CBC w/Diff/Platelet     Status: Abnormal   Collection Time: 03/02/17 12:42 PM  Result Value Ref Range   WBC 5.0 3.8 - 10.8 K/uL   RBC 3.61 (L) 3.80 - 5.10 MIL/uL   Hemoglobin 10.9 (L) 11.7 - 15.5 g/dL   HCT 33.2 (L) 35.0 - 45.0 %   MCV 92.0 80.0 - 100.0 fL   MCH 30.2 27.0 - 33.0 pg   MCHC 32.8 32.0 - 36.0 g/dL   RDW 13.6 11.0 - 15.0 %   Platelets 271 140 - 400 K/uL   MPV 9.4 7.5 - 12.5 fL   Neutro Abs 2,700 1,500 - 7,800 cells/uL   Lymphs Abs 1,800 850 - 3,900 cells/uL   Monocytes Absolute 300 200 - 950 cells/uL   Eosinophils Absolute 200 15 - 500 cells/uL   Basophils Absolute 0 0 - 200 cells/uL   Neutrophils Relative % 54 %   Lymphocytes Relative 36 %   Monocytes Relative 6 %   Eosinophils Relative 4 %   Basophils Relative 0 %   Smear Review Criteria for  review not met   Hemoglobin A1c     Status: None   Collection Time: 03/02/17 12:42 PM  Result Value Ref Range   Hgb A1c MFr Bld 5.1 <5.7 %    Comment:   For the purpose of screening for the presence of diabetes:   <5.7%       Consistent with the absence of diabetes 5.7-6.4 %   Consistent with increased risk for diabetes (prediabetes) >=6.5 %     Consistent with diabetes   This assay result is  consistent with a decreased risk of diabetes.   Currently, no consensus exists regarding use of hemoglobin A1c for diagnosis of diabetes in children.   According to American Diabetes Association (ADA) guidelines, hemoglobin A1c <7.0% represents optimal control in non-pregnant diabetic patients. Different metrics may apply to specific patient populations. Standards of Medical Care in Diabetes (ADA).      Mean Plasma Glucose 100 mg/dL  COMPLETE METABOLIC PANEL WITH GFR     Status: Abnormal   Collection Time: 03/02/17 12:42 PM  Result Value Ref Range   Sodium 144 135 - 146 mmol/L   Potassium 4.2 3.5 - 5.3 mmol/L   Chloride 110 98 - 110 mmol/L   CO2 23 20 - 31 mmol/L   Glucose, Bld 87 65 - 99 mg/dL   BUN 13 7 - 25 mg/dL   Creat 0.91 0.60 - 0.93 mg/dL    Comment:   For patients > or = 77 years of age: The upper reference limit for Creatinine is approximately 13% higher for people identified as African-American.      Total Bilirubin 0.7 0.2 - 1.2 mg/dL   Alkaline Phosphatase 99 33 - 130 U/L   AST 25 10 - 35 U/L   ALT 30 (H) 6 - 29 U/L   Total Protein 6.7 6.1 - 8.1 g/dL   Albumin 4.0 3.6 - 5.1 g/dL   Calcium 9.5 8.6 - 10.4 mg/dL   GFR, Est African American 71 >=60 mL/min   GFR, Est Non African American 61 >=60 mL/min  Lipid panel     Status: Abnormal   Collection Time: 03/02/17 12:42 PM  Result Value Ref Range   Cholesterol 173 <200 mg/dL   Triglycerides 108 <150 mg/dL   HDL 40 (L) >50 mg/dL   Total CHOL/HDL Ratio 4.3 <5.0 Ratio   VLDL 22 <30 mg/dL   LDL Cholesterol 111 (H) <100  mg/dL  Iron, TIBC and Ferritin Panel     Status: Abnormal   Collection Time: 03/30/17  3:28 PM  Result Value Ref Range   Ferritin 122 20 - 288 ng/mL   Iron 41 (L) 45 - 160 ug/dL   TIBC 379 250 - 450 ug/dL   %SAT 11 11 - 50 %  Basic metabolic panel     Status: Abnormal   Collection Time: 04/15/17 12:49 PM  Result Value Ref Range   Sodium 143 135 - 145 mmol/L   Potassium 3.2 (L) 3.5 - 5.1 mmol/L   Chloride 105 101 - 111 mmol/L   CO2 27 22 - 32 mmol/L   Glucose, Bld 159 (H) 65 - 99 mg/dL   BUN 18 6 - 20 mg/dL   Creatinine, Ser 1.13 (H) 0.44 - 1.00 mg/dL   Calcium 9.6 8.9 - 10.3 mg/dL   GFR calc non Af Amer 46 (L) >60 mL/min   GFR calc Af Amer 53 (L) >60 mL/min    Comment: (NOTE) The eGFR has been calculated using the CKD EPI equation. This calculation has not been validated in all clinical situations. eGFR's persistently <60 mL/min signify possible Chronic Kidney Disease.    Anion gap 11 5 - 15  CBC     Status: Abnormal   Collection Time: 04/15/17 12:49 PM  Result Value Ref Range   WBC 5.8 3.6 - 11.0 K/uL   RBC 3.73 (L) 3.80 - 5.20 MIL/uL   Hemoglobin 11.4 (L) 12.0 - 16.0 g/dL   HCT 33.9 (L) 35.0 - 47.0 %   MCV 90.9 80.0 - 100.0 fL  MCH 30.5 26.0 - 34.0 pg   MCHC 33.5 32.0 - 36.0 g/dL   RDW 14.4 11.5 - 14.5 %   Platelets 261 150 - 440 K/uL  Troponin I     Status: Abnormal   Collection Time: 04/15/17 12:49 PM  Result Value Ref Range   Troponin I 0.04 (HH) <0.03 ng/mL    Comment: CRITICAL RESULT CALLED TO, READ BACK BY AND VERIFIED WITH OLIVIA BROOMER AT 9518 ON 04/15/17 [place].   Brain natriuretic peptide     Status: Abnormal   Collection Time: 04/15/17 12:49 PM  Result Value Ref Range   B Natriuretic Peptide 738.0 (H) 0.0 - 100.0 pg/mL  Troponin I     Status: Abnormal   Collection Time: 04/15/17  4:53 PM  Result Value Ref Range   Troponin I 0.04 (HH) <0.03 ng/mL    Comment: CRITICAL VALUE NOTED. VALUE IS CONSISTENT WITH PREVIOUSLY REPORTED/CALLED VALUE SNJ   Protime-INR     Status: Abnormal   Collection Time: 04/18/17  2:12 PM  Result Value Ref Range   Prothrombin Time 16.1 (H) 11.4 - 15.2 seconds   INR 8.41   Basic metabolic panel     Status: Abnormal   Collection Time: 04/18/17  2:12 PM  Result Value Ref Range   Sodium 145 135 - 145 mmol/L   Potassium 2.9 (L) 3.5 - 5.1 mmol/L   Chloride 105 101 - 111 mmol/L   CO2 28 22 - 32 mmol/L   Glucose, Bld 97 65 - 99 mg/dL   BUN 17 6 - 20 mg/dL   Creatinine, Ser 0.91 0.44 - 1.00 mg/dL   Calcium 9.4 8.9 - 10.3 mg/dL   GFR calc non Af Amer 60 (L) >60 mL/min   GFR calc Af Amer >60 >60 mL/min    Comment: (NOTE) The eGFR has been calculated using the CKD EPI equation. This calculation has not been validated in all clinical situations. eGFR's persistently <60 mL/min signify possible Chronic Kidney Disease.    Anion gap 12 5 - 15  Brain natriuretic peptide     Status: Abnormal   Collection Time: 04/19/17  6:58 AM  Result Value Ref Range   B Natriuretic Peptide 1,000.0 (H) 0.0 - 100.0 pg/mL  CBC     Status: Abnormal   Collection Time: 04/19/17  7:01 AM  Result Value Ref Range   WBC 5.2 3.6 - 11.0 K/uL   RBC 3.79 (L) 3.80 - 5.20 MIL/uL   Hemoglobin 11.4 (L) 12.0 - 16.0 g/dL   HCT 33.9 (L) 35.0 - 47.0 %   MCV 89.2 80.0 - 100.0 fL   MCH 30.1 26.0 - 34.0 pg   MCHC 33.7 32.0 - 36.0 g/dL   RDW 14.3 11.5 - 14.5 %   Platelets 261 150 - 440 K/uL  Comprehensive metabolic panel     Status: Abnormal   Collection Time: 04/19/17  7:01 AM  Result Value Ref Range   Sodium 144 135 - 145 mmol/L   Potassium 2.7 (LL) 3.5 - 5.1 mmol/L    Comment: CRITICAL RESULT CALLED TO, READ BACK BY AND VERIFIED WITH  MEGAN JONES AT 6606 04/19/17 S[place]    Chloride 106 101 - 111 mmol/L   CO2 27 22 - 32 mmol/L   Glucose, Bld 126 (H) 65 - 99 mg/dL   BUN 18 6 - 20 mg/dL   Creatinine, Ser 1.03 (H) 0.44 - 1.00 mg/dL   Calcium 9.2 8.9 - 10.3 mg/dL   Total Protein 7.1 6.5 -  8.1 g/dL   Albumin 4.1 3.5 - 5.0 g/dL   AST 32 15 -  41 U/L   ALT 34 14 - 54 U/L   Alkaline Phosphatase 111 38 - 126 U/L   Total Bilirubin 2.0 (H) 0.3 - 1.2 mg/dL   GFR calc non Af Amer 51 (L) >60 mL/min   GFR calc Af Amer 60 (L) >60 mL/min    Comment: (NOTE) The eGFR has been calculated using the CKD EPI equation. This calculation has not been validated in all clinical situations. eGFR's persistently <60 mL/min signify possible Chronic Kidney Disease.    Anion gap 11 5 - 15  Troponin I     Status: Abnormal   Collection Time: 04/19/17  7:01 AM  Result Value Ref Range   Troponin I 0.05 (HH) <0.03 ng/mL    Comment: CRITICAL RESULT CALLED TO, READ BACK BY AND VERIFIED WITH  MEGAN JONES AT 1610 04/19/17 SDR   Magnesium     Status: None   Collection Time: 04/19/17  7:01 AM  Result Value Ref Range   Magnesium 1.8 1.7 - 2.4 mg/dL  TSH     Status: Abnormal   Collection Time: 04/19/17  7:01 AM  Result Value Ref Range   TSH 8.043 (H) 0.350 - 4.500 uIU/mL    Comment: Performed by a 3rd Generation assay with a functional sensitivity of <=0.01 uIU/mL.  ECHOCARDIOGRAM COMPLETE     Status: None   Collection Time: 04/19/17  3:05 PM  Result Value Ref Range   Weight 4,304 oz   BP 136/86 mmHg  Potassium     Status: Abnormal   Collection Time: 04/19/17  7:17 PM  Result Value Ref Range   Potassium 3.4 (L) 3.5 - 5.1 mmol/L  Magnesium     Status: None   Collection Time: 04/19/17  7:17 PM  Result Value Ref Range   Magnesium 1.8 1.7 - 2.4 mg/dL  Basic metabolic panel     Status: Abnormal   Collection Time: 04/20/17  3:24 AM  Result Value Ref Range   Sodium 146 (H) 135 - 145 mmol/L   Potassium 3.5 3.5 - 5.1 mmol/L   Chloride 106 101 - 111 mmol/L   CO2 30 22 - 32 mmol/L   Glucose, Bld 118 (H) 65 - 99 mg/dL   BUN 20 6 - 20 mg/dL   Creatinine, Ser 0.90 0.44 - 1.00 mg/dL   Calcium 9.1 8.9 - 10.3 mg/dL   GFR calc non Af Amer >60 >60 mL/min   GFR calc Af Amer >60 >60 mL/min    Comment: (NOTE) The eGFR has been calculated using the CKD EPI  equation. This calculation has not been validated in all clinical situations. eGFR's persistently <60 mL/min signify possible Chronic Kidney Disease.    Anion gap 10 5 - 15  CBC     Status: Abnormal   Collection Time: 04/20/17  3:24 AM  Result Value Ref Range   WBC 5.8 3.6 - 11.0 K/uL   RBC 3.79 (L) 3.80 - 5.20 MIL/uL   Hemoglobin 11.4 (L) 12.0 - 16.0 g/dL   HCT 34.1 (L) 35.0 - 47.0 %   MCV 89.9 80.0 - 100.0 fL   MCH 30.1 26.0 - 34.0 pg   MCHC 33.5 32.0 - 36.0 g/dL   RDW 14.2 11.5 - 14.5 %   Platelets 252 150 - 440 K/uL  Magnesium     Status: None   Collection Time: 04/20/17  3:24 AM  Result Value Ref Range  Magnesium 2.2 1.7 - 2.4 mg/dL  T4, free     Status: Abnormal   Collection Time: 04/20/17  3:24 AM  Result Value Ref Range   Free T4 1.31 (H) 0.61 - 1.12 ng/dL    Comment: (NOTE) Biotin ingestion may interfere with free T4 tests. If the results are inconsistent with the TSH level, previous test results, or the clinical presentation, then consider biotin interference. If needed, order repeat testing after stopping biotin.   T3, free     Status: None   Collection Time: 04/20/17  3:24 AM  Result Value Ref Range   T3, Free 3.1 2.0 - 4.4 pg/mL    Comment: (NOTE) Performed At: [facility] [place], [place] [id number] Lindon Romp MD [id number]   Potassium     Status: None   Collection Time: 04/20/17  5:49 PM  Result Value Ref Range   Potassium 3.9 3.5 - 5.1 mmol/L  Basic metabolic panel     Status: Abnormal   Collection Time: 04/21/17  5:31 AM  Result Value Ref Range   Sodium 146 (H) 135 - 145 mmol/L   Potassium 4.1 3.5 - 5.1 mmol/L   Chloride 109 101 - 111 mmol/L   CO2 29 22 - 32 mmol/L   Glucose, Bld 130 (H) 65 - 99 mg/dL   BUN 22 (H) 6 - 20 mg/dL   Creatinine, Ser 0.89 0.44 - 1.00 mg/dL   Calcium 9.7 8.9 - 10.3 mg/dL   GFR calc non Af Amer >60 >60 mL/min   GFR calc Af Amer >60 >60 mL/min    Comment: (NOTE) The eGFR has been  calculated using the CKD EPI equation. This calculation has not been validated in all clinical situations. eGFR's persistently <60 mL/min signify possible Chronic Kidney Disease.    Anion gap 8 5 - 15  Basic metabolic panel     Status: Abnormal   Collection Time: 04/22/17  6:33 AM  Result Value Ref Range   Sodium 145 135 - 145 mmol/L   Potassium 3.7 3.5 - 5.1 mmol/L   Chloride 106 101 - 111 mmol/L   CO2 30 22 - 32 mmol/L   Glucose, Bld 107 (H) 65 - 99 mg/dL   BUN 19 6 - 20 mg/dL   Creatinine, Ser 1.02 (H) 0.44 - 1.00 mg/dL   Calcium 9.3 8.9 - 10.3 mg/dL   GFR calc non Af Amer 52 (L) >60 mL/min   GFR calc Af Amer >60 >60 mL/min    Comment: (NOTE) The eGFR has been calculated using the CKD EPI equation. This calculation has not been validated in all clinical situations. eGFR's persistently <60 mL/min signify possible Chronic Kidney Disease.    Anion gap 9 5 - 15  CBC     Status: Abnormal   Collection Time: 04/22/17  6:33 AM  Result Value Ref Range   WBC 5.7 3.6 - 11.0 K/uL   RBC 3.84 3.80 - 5.20 MIL/uL   Hemoglobin 11.6 (L) 12.0 - 16.0 g/dL   HCT 34.7 (L) 35.0 - 47.0 %   MCV 90.4 80.0 - 100.0 fL   MCH 30.1 26.0 - 34.0 pg   MCHC 33.3 32.0 - 36.0 g/dL   RDW 14.3 11.5 - 14.5 %   Platelets 258 150 - 440 K/uL  Basic metabolic panel     Status: Abnormal   Collection Time: 04/23/17  5:08 AM  Result Value Ref Range   Sodium 143 135 - 145 mmol/L   Potassium  3.5 3.5 - 5.1 mmol/L   Chloride 102 101 - 111 mmol/L   CO2 30 22 - 32 mmol/L   Glucose, Bld 117 (H) 65 - 99 mg/dL   BUN 22 (H) 6 - 20 mg/dL   Creatinine, Ser 1.01 (H) 0.44 - 1.00 mg/dL   Calcium 9.9 8.9 - 10.3 mg/dL   GFR calc non Af Amer 53 (L) >60 mL/min   GFR calc Af Amer >60 >60 mL/min    Comment: (NOTE) The eGFR has been calculated using the CKD EPI equation. This calculation has not been validated in all clinical situations. eGFR's persistently <60 mL/min signify possible Chronic Kidney Disease.    Anion gap 11  5 - 15  Magnesium     Status: None   Collection Time: 04/23/17  5:08 AM  Result Value Ref Range   Magnesium 1.9 1.7 - 2.4 mg/dL      PHQ2/9: Depression screen [facility] 2/9 04/28/2017 03/02/2017 11/16/2016 06/22/2016 08/15/2015  Decreased Interest 0 0 0 0 0  Down, Depressed, Hopeless 0 0 0 0 0  PHQ - 2 Score 0 0 0 0 0     Fall Risk: Fall Risk  04/28/2017 03/02/2017 11/16/2016 06/22/2016 08/15/2015  Falls in the past year? No No No No No  Number falls in past yr: - - - - -  Injury with Fall? - - - - -     Assessment & Plan  1. Hospital discharge follow-up  Admitted on 08/06 and discharged on 08/10, seen by cardiologist NP. She has been taking lasix 60 mg daily instead of 40 by mistake, and we combined the pills, she is feeling tired, but states SOB is stable  She may stop Symbicort and Xyzal since SOB related to CHF and she does not like the side effects of medication. She was advised to have cardiologist fill FMLA , she was given a 30 day excuse for work by [employer]   2. Chronic systolic and diastolic congestive heart failure, NYHA class 2 (HCC)  Is may be worse since taking 60 mg of lasix instead of 40 mg - Ambulatory referral to Cardiology - BASIC METABOLIC PANEL WITH GFR  3. Hypokalemia  Recheck labs - BASIC METABOLIC PANEL WITH GFR  4. Misuse of prescription only drugs  We went over all her medication, go down to lasix twice daily.  - BASIC METABOLIC PANEL WITH GFR

## 2017-05-10 LAB — BASIC METABOLIC PANEL WITH GFR
BUN: 38 mg/dL — ABNORMAL HIGH (ref 7–25)
CALCIUM: 9.8 mg/dL (ref 8.6–10.4)
CHLORIDE: 102 mmol/L (ref 98–110)
CO2: 19 mmol/L — AB (ref 20–32)
CREATININE: 1.15 mg/dL — AB (ref 0.60–0.93)
GFR, Est African American: 53 mL/min — ABNORMAL LOW (ref >=60)
GFR, Est Non African American: 46 mL/min — ABNORMAL LOW (ref >=60)
GLUCOSE: 100 mg/dL — AB (ref 65–99)
Potassium: 5.3 mmol/L (ref 3.5–5.3)
Sodium: 134 mmol/L — ABNORMAL LOW (ref 135–146)

## 2017-05-12 ENCOUNTER — Other Ambulatory Visit: Payer: Self-pay | Admitting: Family Medicine

## 2017-05-12 DIAGNOSIS — R899 Unspecified abnormal finding in specimens from other organs, systems and tissues: Secondary | ICD-10-CM

## 2017-05-12 NOTE — Progress Notes (Signed)
please

## 2017-05-18 ENCOUNTER — Other Ambulatory Visit: Payer: Self-pay

## 2017-05-18 DIAGNOSIS — R899 Unspecified abnormal finding in specimens from other organs, systems and tissues: Secondary | ICD-10-CM

## 2017-05-18 LAB — BASIC METABOLIC PANEL WITH GFR
BUN / CREAT RATIO: 25 (calc) — AB (ref 6–22)
BUN: 33 mg/dL — AB (ref 7–25)
CALCIUM: 10.3 mg/dL (ref 8.6–10.4)
CHLORIDE: 101 mmol/L (ref 98–110)
CO2: 24 mmol/L (ref 20–32)
Creat: 1.32 mg/dL — ABNORMAL HIGH (ref 0.60–0.93)
GFR, EST AFRICAN AMERICAN: 45 mL/min/{1.73_m2} — AB (ref >=60)
GFR, Est Non African American: 39 mL/min/{1.73_m2} — ABNORMAL LOW (ref >=60)
GLUCOSE: 137 mg/dL (ref 65–139)
POTASSIUM: 4.9 mmol/L (ref 3.5–5.3)
Sodium: 135 mmol/L (ref 135–146)

## 2017-05-19 ENCOUNTER — Other Ambulatory Visit: Payer: Self-pay | Admitting: Family Medicine

## 2017-05-19 DIAGNOSIS — R944 Abnormal results of kidney function studies: Secondary | ICD-10-CM

## 2017-05-26 ENCOUNTER — Encounter: Payer: Self-pay | Admitting: Family

## 2017-05-26 ENCOUNTER — Ambulatory Visit: Payer: Medicare Other | Attending: Family | Admitting: Family

## 2017-05-26 VITALS — BP 120/64 | HR 67 | Resp 18 | Ht 66.0 in | Wt 246.0 lb

## 2017-05-26 DIAGNOSIS — E669 Obesity, unspecified: Secondary | ICD-10-CM | POA: Diagnosis not present

## 2017-05-26 DIAGNOSIS — J449 Chronic obstructive pulmonary disease, unspecified: Secondary | ICD-10-CM | POA: Insufficient documentation

## 2017-05-26 DIAGNOSIS — E785 Hyperlipidemia, unspecified: Secondary | ICD-10-CM | POA: Diagnosis not present

## 2017-05-26 DIAGNOSIS — M1990 Unspecified osteoarthritis, unspecified site: Secondary | ICD-10-CM | POA: Diagnosis not present

## 2017-05-26 DIAGNOSIS — Z87891 Personal history of nicotine dependence: Secondary | ICD-10-CM | POA: Insufficient documentation

## 2017-05-26 DIAGNOSIS — I4430 Unspecified atrioventricular block: Secondary | ICD-10-CM | POA: Insufficient documentation

## 2017-05-26 DIAGNOSIS — I110 Hypertensive heart disease with heart failure: Secondary | ICD-10-CM | POA: Diagnosis present

## 2017-05-26 DIAGNOSIS — I5022 Chronic systolic (congestive) heart failure: Secondary | ICD-10-CM | POA: Diagnosis present

## 2017-05-26 DIAGNOSIS — Z6841 Body Mass Index (BMI) 40.0 and over, adult: Secondary | ICD-10-CM | POA: Insufficient documentation

## 2017-05-26 DIAGNOSIS — I10 Essential (primary) hypertension: Secondary | ICD-10-CM

## 2017-05-26 MED ORDER — SACUBITRIL-VALSARTAN 24-26 MG PO TABS: 1.0000 | ORAL_TABLET | Freq: Two times a day (BID) | ORAL | 3 refills | Status: DC

## 2017-05-26 NOTE — Patient Instructions (Signed)
Continue weighing daily and call for an overnight weight gain of > 2 pounds or a weekly weight gain of >5 pounds.  Decrease furosemide and potassium to 1 tablet daily.  Stop losartan and begin entresto 24/26mg  twice daliy

## 2017-05-26 NOTE — Progress Notes (Signed)
Agree with pharmacist note below.  Physical exam and ROS done by myself.  Patient's BP initially high but better upon recheck with a manual cuff. She does say that she ate cube steak with gravy on it yesterday and realized that it probably had too much salt in it.   Stopping losartan and switching to entresto 24/26mg  twice daily. Also decreasing her furosemide to 40mg  QAM and potassium 11meq QAM. Call for an overnight weight gain of >2 pounds or a weekly weight gain of >5 pounds.   BMP in 2 weeks.  Darylene Price, FNP [facility]    Patient ID: Kimberly Henry, female    DOB: 03/08/1941, 76 y.o.   MRN: [id number]  HPI  Kimberly Henry is a 76 y/o female with a history of AV block, COPD, hyperlipidemia, HTN, osteoarthritis, remote tobacco use and chronic heart failure.  Echo from 04/19/17 reviewed and shows an EF of 30-35% along with mild MR and moderate TR. Right/Left Cardiac catheterization was done 04/21/17 and showed mild nonobstructive CAD with an EF of 25-35%. Moderately elevated filling pressure, severe pulmonary HTN and severely reduced cardiac output at 3.14 with a cardiac index of 1.38. PA pressure was 61/34 with a mean of 47 mm Hg.   Admitted 04/19/17 due to heart failure. Initially needed IV diuretics with transition to oral diuretics. Cardiology consult obtained. Recommend outpatient sleep study. Elevated troponin thought to be due to demand ischemia. Discharged home after 4 days. Was in the ED 04/15/17 due to HF and she was treated/released.   She presents today for follow up visit with a chief complaint of not sleeping well. She has been taking her Lasix evening dose around 9-10 pm, and therefore wakes up multiple times in the night to use the bathroom. She denies any shortness of breath, chest pain, edema or weight gain.   Past Medical History:  Diagnosis Date  . Acid phosphatase elevated   . AV block, 1st degree   . CHF (congestive heart failure) ([place])   . COPD (chronic obstructive  pulmonary disease) ([place])   . Hx of cervical malignancy   . Hyperlipidemia   . Hypertension   . Metabolic syndrome   . Obesity, Class III, BMI 40-49.9 (morbid obesity) ([place])   . Osteoarthritis of both knees    Past Surgical History:  Procedure Laterality Date  . ABDOMINAL HYSTERECTOMY  1996  . CATARACT EXTRACTION Left 2008  . RIGHT/LEFT HEART CATH AND CORONARY ANGIOGRAPHY N/A 04/21/2017   Procedure: RIGHT/LEFT HEART CATH AND CORONARY ANGIOGRAPHY;  Surgeon: Wellington Hampshire, MD;  Location: [facility];  Service: Cardiovascular;  Laterality: N/A;   Family History  Problem Relation Age of Onset  . Hypertension Mother   . Congestive Heart Failure Mother   . Congestive Heart Failure Father    Social History  Substance Use Topics  . Smoking status: Former Smoker    Years: 10.00    Types: Cigarettes    Start date: 09/14/1975    Quit date: 06/22/1986  . Smokeless tobacco: Never Used  . Alcohol use No   Allergies  Allergen Reactions  . Lovastatin     Other reaction(s): Muscle Pain   Prior to Admission medications   Medication Sig Start Date End Date Taking? Authorizing Provider  aspirin EC 81 MG tablet Take 81 mg by mouth daily.   Yes [provider]  atorvastatin (LIPITOR) 40 MG tablet Take 1 tablet (40 mg total) by mouth daily. 03/15/17  Yes Steele Sizer, MD  budesonide-formoterol (SYMBICORT) 160-4.5 MCG/ACT inhaler Inhale 2 puffs into the lungs daily. 03/02/17  Yes Hubbard Hartshorn, FNP  carvedilol (COREG) 6.25 MG tablet Take 1 tablet (6.25 mg total) by mouth 2 (two) times daily with a meal. 04/23/17  Yes Demetrios Loll, MD  digoxin (LANOXIN) 0.125 MG tablet Take 1 tablet (0.125 mg total) by mouth daily. 04/24/17  Yes Demetrios Loll, MD  fluticasone [facility]) 50 MCG/ACT nasal spray USE 2 SPRAYS IN EACH  NOSTRIL DAILY 10/21/16  Yes Sowles, Drue Stager, MD  furosemide (LASIX) 40 MG tablet Take 1 tablet (40 mg total) by mouth 2 (two) times daily. 04/23/17 04/23/18 Yes Demetrios Loll, MD   losartan (COZAAR) 50 MG tablet Take 1 tablet (50 mg total) by mouth daily. 04/24/17  Yes Demetrios Loll, MD  potassium chloride SA (K-DUR,KLOR-CON) 20 MEQ tablet Take 20 mEq by mouth daily.   Yes [provider]  spironolactone (ALDACTONE) 25 MG tablet Take 1 tablet (25 mg total) by mouth daily. 04/24/17  Yes Demetrios Loll, MD  acetaminophen (TYLENOL) 500 MG chewable tablet Chew 500 mg by mouth every 6 (six) hours as needed for pain.    [provider]  levocetirizine (XYZAL) 5 MG tablet Take 1 tablet (5 mg total) by mouth every evening. Patient not taking: Reported on 04/28/2017 03/02/17   Hubbard Hartshorn, FNP   Review of Systems  Constitutional: Positive for fatigue. Negative for appetite change.  HENT: Negative for congestion, postnasal drip and sore throat.   Eyes: Negative.   Respiratory: Negative for cough, chest tightness and shortness of breath.   Cardiovascular: Negative for palpitations and leg swelling.  Gastrointestinal: Negative for abdominal distention and abdominal pain.  Endocrine: Negative.   Genitourinary: Negative.   Musculoskeletal: Negative for back pain and neck pain.  Skin: Negative.   Allergic/Immunologic: Negative.   Neurological: Negative for dizziness and light-headedness.  Hematological: Negative for adenopathy. Does not bruise/bleed easily.  Psychiatric/Behavioral: Positive for sleep disturbance (sleeping on 1-2 pillows). Negative for dysphoric mood. The patient is not nervous/anxious.    Vitals:   05/26/17 1038 05/26/17 1100  BP: (!) 123/108 120/64  Pulse: 67   Resp: 18   SpO2: 100%   Weight: 246 lb (111.6 kg)   Height: 5\' 6"  (1.676 m)    Wt Readings from Last 3 Encounters:  05/26/17 246 lb (111.6 kg)  05/09/17 244 lb 4.8 oz (110.8 kg)  04/28/17 248 lb 4 oz (112.6 kg)    Lab Results  Component Value Date   CREATININE 1.32 (H) 05/18/2017   CREATININE 1.15 (H) 05/09/2017   CREATININE 1.01 (H) 04/23/2017   Physical Exam  Constitutional:  She is oriented to person, place, and time. She appears well-developed and well-nourished.  HENT:  Head: Normocephalic and atraumatic.  Neck: Normal range of motion. Neck supple. No JVD present.  Cardiovascular: Normal rate and regular rhythm.   Pulmonary/Chest: Effort normal. She has no wheezes. She has no rales.  Abdominal: Soft. She exhibits no distension. There is no tenderness.  Musculoskeletal: She exhibits no edema or tenderness.  Neurological: She is alert and oriented to person, place, and time.  Skin: Skin is warm and dry.  Psychiatric: She has a normal mood and affect. Her behavior is normal. Thought content normal.  Nursing note and vitals reviewed.  Assessment & Plan:  1: Chronic heart failure with reduced ejection fraction- - NYHA class II - Euvolemic today - Denies SOB, CP, dizziness or recent falls  - Already weighing daily. Instructed to call  for an overnight weight gain of >2 pounds or a weekly weight gain of >5 pounds - Not adding salt and has been reading food labels; using no sodium seasoning such as Mrs. Dash. Discussed the importance of closely following a 2000mg  sodium diet and written dietary information was given to her about this.  - Does wear support socks  - Switched losartan to Entresto 24/26mg  twice daily. - Decreased Lasix dose to 40mg  in the morning; decreased KCl to 101mEq in the morning. - Has appointment with cardiology Sharolyn Douglas) 05/31/17  - Return to CHF clinic in 2 weeks to check BMP   2: HTN- - BP good today (machine 123/108; rechecked manually 120/64)  - denies dizziness  Medication list reviewed.   Return here in 2 weeks (BMP) or sooner for any questions/problems before then.   Candelaria Stagers, PharmD Pharmacy Resident  05/26/17

## 2017-05-31 ENCOUNTER — Encounter: Payer: Self-pay | Admitting: Nurse Practitioner

## 2017-05-31 ENCOUNTER — Ambulatory Visit (INDEPENDENT_AMBULATORY_CARE_PROVIDER_SITE_OTHER): Payer: Medicare Other | Admitting: Nurse Practitioner

## 2017-05-31 VITALS — BP 120/60 | HR 74 | Ht 65.0 in | Wt 246.5 lb

## 2017-05-31 DIAGNOSIS — I428 Other cardiomyopathies: Secondary | ICD-10-CM | POA: Diagnosis not present

## 2017-05-31 DIAGNOSIS — E782 Mixed hyperlipidemia: Secondary | ICD-10-CM

## 2017-05-31 DIAGNOSIS — I5022 Chronic systolic (congestive) heart failure: Secondary | ICD-10-CM | POA: Diagnosis not present

## 2017-05-31 DIAGNOSIS — N183 Chronic kidney disease, stage 3 unspecified: Secondary | ICD-10-CM

## 2017-05-31 DIAGNOSIS — I10 Essential (primary) hypertension: Secondary | ICD-10-CM

## 2017-05-31 DIAGNOSIS — I2721 Secondary pulmonary arterial hypertension: Secondary | ICD-10-CM

## 2017-05-31 NOTE — Patient Instructions (Addendum)
Medication Instructions:  Please continue your current medications  Labwork: None  Testing/Procedures: Your physician has requested that you have an echocardiogram. Echocardiography is a painless test that uses sound waves to create images of your heart. It provides your doctor with information about the size and shape of your heart and how well your heart's chambers and valves are working. This procedure takes approximately one hour. There are no restrictions for this procedure.  Follow-Up: With Dr. Fletcher Anon after your ECHO  If you need a refill on your cardiac medications before your next appointment, please call your pharmacy.   Echocardiogram An echocardiogram, or echocardiography, uses sound waves (ultrasound) to produce an image of your heart. The echocardiogram is simple, painless, obtained within a short period of time, and offers valuable information to your health care provider. The images from an echocardiogram can provide information such as:  Evidence of coronary artery disease (CAD).  Heart size.  Heart muscle function.  Heart valve function.  Aneurysm detection.  Evidence of a past heart attack.  Fluid buildup around the heart.  Heart muscle thickening.  Assess heart valve function.  Tell a health care provider about:  Any allergies you have.  All medicines you are taking, including vitamins, herbs, eye drops, creams, and over-the-counter medicines.  Any problems you or family members have had with anesthetic medicines.  Any blood disorders you have.  Any surgeries you have had.  Any medical conditions you have.  Whether you are pregnant or may be pregnant. What happens before the procedure? No special preparation is needed. Eat and drink normally. What happens during the procedure?  In order to produce an image of your heart, gel will be applied to your chest and a wand-like tool (transducer) will be moved over your chest. The gel will help transmit  the sound waves from the transducer. The sound waves will harmlessly bounce off your heart to allow the heart images to be captured in real-time motion. These images will then be recorded.  You may need an IV to receive a medicine that improves the quality of the pictures. What happens after the procedure? You may return to your normal schedule including diet, activities, and medicines, unless your health care provider tells you otherwise. This information is not intended to replace advice given to you by your health care provider. Make sure you discuss any questions you have with your health care provider. Document Released: 08/27/2000 Document Revised: 04/17/2016 Document Reviewed: 05/07/2013 Elsevier Interactive Patient Education  2017 [employer].

## 2017-05-31 NOTE — Progress Notes (Signed)
Office Visit    Patient Name: Kimberly Henry Date of Encounter: 05/31/2017  Primary Care Provider:  Steele Sizer, MD Primary Cardiologist:  Jerilynn Mages. Fletcher Anon, MD   Chief Complaint    76 y.o. ? w/ a history of essential hypertension, obesity, COPD secondary to previous tobacco abuse, hyperlipidemia, nonobs CAD, NICM, HFrEF, and PAH, who presents for f/u.  Past Medical History    Past Medical History:  Diagnosis Date  . Acid phosphatase elevated   . AV block, 1st degree   . COPD (chronic obstructive pulmonary disease) ([place])   . HFrEF (heart failure with reduced ejection fraction) ([place])    a. 04/2017 Echo: EF 30-35%, antsept HK, mild MR, mod dil LA, mildly dil RA, mod TR, mildly to mod elev PASP.  [facility] Kitchen Hx of cervical malignancy   . Hyperlipidemia   . Hypertension   . LBBB (left bundle branch block)   . Metabolic syndrome   . NICM (nonischemic cardiomyopathy) ([place])    a. 04/2017 Echo: EF 30-35%; b. 04/2017 Cath: nonobs dzs.  . Non-obstructive CAD (coronary artery disease)    a. 04/2017 Cath: mild nonobs CAD. EF 25-35%. CO 3.14, CI 1.38. Sev PAH [61/34(47].  . Obesity, Class III, BMI 40-49.9 (morbid obesity) ([place])   . Osteoarthritis of both knees   . PAH (pulmonary arterial hypertension) with portal hypertension ([place])    a. 04/2017 Right Heart Cath: Sev PAH 61/34(47).   Past Surgical History:  Procedure Laterality Date  . ABDOMINAL HYSTERECTOMY  1996  . CATARACT EXTRACTION Left 2008  . RIGHT/LEFT HEART CATH AND CORONARY ANGIOGRAPHY N/A 04/21/2017   Procedure: RIGHT/LEFT HEART CATH AND CORONARY ANGIOGRAPHY;  Surgeon: Wellington Hampshire, MD;  Location: [facility];  Service: Cardiovascular;  Laterality: N/A;    Allergies  Allergies  Allergen Reactions  . Lovastatin     Other reaction(s): Muscle Pain    History of Present Illness    76 year old female with a history of hypertension, hyperlipidemia, nonobstructive CAD, nonischemic cardiomyopathy, HFrEF, obesity, COPD in the  setting of remote tobacco abuse, and pulmonary hypertension. She was hospitalized August in the setting of volume overload and new finding of LV dysfunction with an EF of 30-35% by echo. Catheterization at that time showed nonobstructive CAD with elevated right heart pressures and a pulmonary artery pressure 61/34 (47).  She has been medically managed and followed closely heart failure clinic. She has been doing quite well and euvolemic at a weight of 246 pounds. She was recently switched to [facility] and so far is tolerating this well. Her blood pressure is well controlled today at 120/60. She denies any chest pain, dyspnea, palpitations, PND, orthopnea, dizziness, syncope, edema, or early satiety. She is scheduled for follow-up basic metabolic panel next week along with heart failure clinic follow-up.  Home Medications    Prior to Admission medications   Medication Sig Start Date End Date Taking? Authorizing Provider  acetaminophen (TYLENOL) 500 MG chewable tablet Chew 500 mg by mouth every 6 (six) hours as needed for pain.   Yes [provider]  aspirin EC 81 MG tablet Take 81 mg by mouth daily.   Yes [provider]  atorvastatin (LIPITOR) 40 MG tablet Take 1 tablet (40 mg total) by mouth daily. 03/15/17  Yes Sowles, Drue Stager, MD  carvedilol (COREG) 6.25 MG tablet Take 1 tablet (6.25 mg total) by mouth 2 (two) times daily with a meal. 04/23/17  Yes Demetrios Loll, MD  digoxin (LANOXIN) 0.125 MG tablet Take  1 tablet (0.125 mg total) by mouth daily. 04/24/17  Yes Demetrios Loll, MD  fluticasone [facility]) 50 MCG/ACT nasal spray USE 2 SPRAYS IN EACH  NOSTRIL DAILY 10/21/16  Yes Sowles, Drue Stager, MD  furosemide (LASIX) 40 MG tablet Take 40 mg by mouth daily.   Yes [provider]  potassium chloride SA (K-DUR,KLOR-CON) 20 MEQ tablet Take 20 mEq by mouth daily.   Yes [provider]  sacubitril-valsartan (ENTRESTO) 24-26 MG Take 1 tablet by mouth 2 (two) times daily. 05/26/17  Yes  Darylene Price A, FNP  spironolactone (ALDACTONE) 25 MG tablet Take 1 tablet (25 mg total) by mouth daily. 04/24/17  Yes Demetrios Loll, MD    Review of Systems    As above, she is been doing exceptionally well.  She denies chest pain, palpitations, dyspnea, pnd, orthopnea, n, v, dizziness, syncope, edema, weight gain, or early satiety.  All other systems reviewed and are otherwise negative except as noted above.  Physical Exam    VS:  BP 120/60 (BP Location: Left Arm, Patient Position: Sitting, Cuff Size: Large)   Pulse 74   Ht 5\' 5"  (1.651 m)   Wt 246 lb 8 oz (111.8 kg)   BMI 41.02 kg/m  , BMI Body mass index is 41.02 kg/m. KZS:WFUXN, in no acute distress.  HEENT: normal.  Neck: Supple, no JVD, carotid bruits, or masses. Cardiac: RRR, no murmurs, rubs, or gallops. No clubbing, cyanosis, edema.  Radials/DP/PT 2+ and equal bilaterally.  Respiratory:  Respirations regular and unlabored, clear to auscultation bilaterally. GI: Soft, nontender, nondistended, BS + x 4. MS: no deformity or atrophy. Skin: warm and dry, no rash. Neuro:  Strength and sensation are intact. Psych: Normal affect.  Accessory Clinical Findings    ECG - Regular sinus rhythm, 74, first-degree AV block, left bundle branch block no acute changes   Assessment & Plan    1.  nonischemic cardiomyopathy/HFrEF: Patient was hospitalized in August with progressive dyspnea and volume overload. Catheterization at the time showed nonobstructive CAD with an EF of 30-35% by echo. She has been managed with beta blocker, digoxin, Lasix, spironolactone, and recently she has been switched to [place]. So far, she is tolerating this well and blood pressure is under excellent control. At 120/60, I'm not sure that she would tolerate any further titration of Entresto at this time. She is scheduled for follow-up with heart failure clinic in one week with basic metabolic panel at that time. I will arrange for a follow-up echocardiogram in  approximately 6 weeks and follow-up with Dr. Fletcher Anon following that, in order to assess need for ICD therapy.  We discussed the importance of daily weights, sodium restriction, medication compliance, and symptom reporting and she verbalizes understanding.   2.  Essential hypertension: Blood pressure is stable today at 120/60. She remains on beta blocker, Lasix, Entresto, and spironolactone.  3.  HL: LDL 111 in June.  Needs f/u lipids lft's when fasting.  4.  PAH:  Prev rec that she have sleep study.  We discussed this today.  She'd like to hold off for now but hasn't ruled it out completely.  5.  CKD II-III:  F/u bmet planned for next week.  6.  Dispo:  F/u in CHF clinic as planned next week w/ bmet @ that time.  I will place order for digoxin level as well, as this has never been checked..  Plan to f/u echo in early Nov with Dr. Fletcher Anon shortly thereafter.   Murray Hodgkins, NP 05/31/2017,  3:29 PM

## 2017-06-02 ENCOUNTER — Ambulatory Visit: Payer: Medicare Other

## 2017-06-09 ENCOUNTER — Ambulatory Visit: Payer: Medicare Other | Attending: Family | Admitting: Family

## 2017-06-09 ENCOUNTER — Encounter: Payer: Self-pay | Admitting: Family

## 2017-06-09 VITALS — BP 145/47 | HR 71 | Resp 18 | Ht 66.0 in | Wt 248.0 lb

## 2017-06-09 DIAGNOSIS — K766 Portal hypertension: Secondary | ICD-10-CM | POA: Insufficient documentation

## 2017-06-09 DIAGNOSIS — E669 Obesity, unspecified: Secondary | ICD-10-CM | POA: Insufficient documentation

## 2017-06-09 DIAGNOSIS — Z79899 Other long term (current) drug therapy: Secondary | ICD-10-CM | POA: Diagnosis not present

## 2017-06-09 DIAGNOSIS — I2721 Secondary pulmonary arterial hypertension: Secondary | ICD-10-CM | POA: Diagnosis not present

## 2017-06-09 DIAGNOSIS — I5022 Chronic systolic (congestive) heart failure: Secondary | ICD-10-CM | POA: Diagnosis present

## 2017-06-09 DIAGNOSIS — Z7951 Long term (current) use of inhaled steroids: Secondary | ICD-10-CM | POA: Diagnosis not present

## 2017-06-09 DIAGNOSIS — Z888 Allergy status to other drugs, medicaments and biological substances status: Secondary | ICD-10-CM | POA: Diagnosis not present

## 2017-06-09 DIAGNOSIS — Z87891 Personal history of nicotine dependence: Secondary | ICD-10-CM | POA: Insufficient documentation

## 2017-06-09 DIAGNOSIS — M170 Bilateral primary osteoarthritis of knee: Secondary | ICD-10-CM | POA: Diagnosis not present

## 2017-06-09 DIAGNOSIS — I110 Hypertensive heart disease with heart failure: Secondary | ICD-10-CM | POA: Diagnosis not present

## 2017-06-09 DIAGNOSIS — I4430 Unspecified atrioventricular block: Secondary | ICD-10-CM | POA: Diagnosis not present

## 2017-06-09 DIAGNOSIS — Z6841 Body Mass Index (BMI) 40.0 and over, adult: Secondary | ICD-10-CM | POA: Insufficient documentation

## 2017-06-09 DIAGNOSIS — Z90710 Acquired absence of both cervix and uterus: Secondary | ICD-10-CM | POA: Insufficient documentation

## 2017-06-09 DIAGNOSIS — Z8541 Personal history of malignant neoplasm of cervix uteri: Secondary | ICD-10-CM | POA: Diagnosis not present

## 2017-06-09 DIAGNOSIS — I447 Left bundle-branch block, unspecified: Secondary | ICD-10-CM | POA: Insufficient documentation

## 2017-06-09 DIAGNOSIS — I2510 Atherosclerotic heart disease of native coronary artery without angina pectoris: Secondary | ICD-10-CM | POA: Insufficient documentation

## 2017-06-09 DIAGNOSIS — J449 Chronic obstructive pulmonary disease, unspecified: Secondary | ICD-10-CM | POA: Diagnosis not present

## 2017-06-09 DIAGNOSIS — E785 Hyperlipidemia, unspecified: Secondary | ICD-10-CM | POA: Insufficient documentation

## 2017-06-09 DIAGNOSIS — Z8249 Family history of ischemic heart disease and other diseases of the circulatory system: Secondary | ICD-10-CM | POA: Insufficient documentation

## 2017-06-09 DIAGNOSIS — I10 Essential (primary) hypertension: Secondary | ICD-10-CM

## 2017-06-09 DIAGNOSIS — Z7982 Long term (current) use of aspirin: Secondary | ICD-10-CM | POA: Insufficient documentation

## 2017-06-09 LAB — BASIC METABOLIC PANEL
ANION GAP: 9 (ref 5–15)
BUN: 23 mg/dL — ABNORMAL HIGH (ref 6–20)
CHLORIDE: 105 mmol/L (ref 101–111)
CO2: 25 mmol/L (ref 22–32)
CREATININE: 0.87 mg/dL (ref 0.44–1.00)
Calcium: 9.6 mg/dL (ref 8.9–10.3)
GFR calc non Af Amer: >60 mL/min (ref >60)
GLUCOSE: 97 mg/dL (ref 65–99)
Potassium: 3.9 mmol/L (ref 3.5–5.1)
Sodium: 139 mmol/L (ref 135–145)

## 2017-06-09 MED ORDER — CARVEDILOL 6.25 MG PO TABS: 6.2500 mg | ORAL_TABLET | Freq: Two times a day (BID) | ORAL | 5 refills | Status: DC

## 2017-06-09 NOTE — Patient Instructions (Signed)
Continue weighing daily and call for an overnight weight gain of > 2 pounds or a weekly weight gain of >5 pounds. 

## 2017-06-09 NOTE — Progress Notes (Signed)
Patient ID: Kimberly Henry, female    DOB: 1941/05/11, 76 y.o.   MRN: [id number]  HPI  Ms Probert is a 76 y/o female with a history of AV block, COPD, hyperlipidemia, HTN, osteoarthritis, remote tobacco use and chronic heart failure.  Echo from 04/19/17 reviewed and shows an EF of 30-35% along with mild MR and moderate TR. Right/Left Cardiac catheterization was done 04/21/17 and showed mild nonobstructive CAD with an EF of 25-35%. Moderately elevated filling pressure, severe pulmonary HTN and severely reduced cardiac output at 3.14 with a cardiac index of 1.38. PA pressure was 61/34 with a mean of 47 mm Hg.   Admitted 04/19/17 due to heart failure. Initially needed IV diuretics with transition to oral diuretics. Cardiology consult obtained. Recommend outpatient sleep study. Elevated troponin thought to be due to demand ischemia. Discharged home after 4 days. Was in the ED 04/15/17 due to HF and she was treated/released.   She presents today for a follow up visit with a chief complaint of minimal shortness of breath upon moderate exertion. She describes this as chronic in nature with varying levels of severity. She has associated fatigue (infrequently). Denies any chest pain, edema, palpitations, dizziness or weight gain. Has resumed working and says that she's doing well with that.   Past Medical History:  Diagnosis Date  . Acid phosphatase elevated   . AV block, 1st degree   . COPD (chronic obstructive pulmonary disease) ([place])   . HFrEF (heart failure with reduced ejection fraction) ([place])    a. 04/2017 Echo: EF 30-35%, antsept HK, mild MR, mod dil LA, mildly dil RA, mod TR, mildly to mod elev PASP.  Marland Kitchen Hx of cervical malignancy   . Hyperlipidemia   . Hypertension   . LBBB (left bundle branch block)   . Metabolic syndrome   . NICM (nonischemic cardiomyopathy) ([place])    a. 04/2017 Echo: EF 30-35%; b. 04/2017 Cath: nonobs dzs.  . Non-obstructive CAD (coronary artery disease)    a. 04/2017 Cath: mild  nonobs CAD. EF 25-35%. CO 3.14, CI 1.38. Sev PAH [61/34(47].  . Obesity, Class III, BMI 40-49.9 (morbid obesity) ([place])   . Osteoarthritis of both knees   . PAH (pulmonary arterial hypertension) with portal hypertension ([place])    a. 04/2017 Right Heart Cath: Sev PAH 61/34(47).   Past Surgical History:  Procedure Laterality Date  . ABDOMINAL HYSTERECTOMY  1996  . CATARACT EXTRACTION Left 2008  . RIGHT/LEFT HEART CATH AND CORONARY ANGIOGRAPHY N/A 04/21/2017   Procedure: RIGHT/LEFT HEART CATH AND CORONARY ANGIOGRAPHY;  Surgeon: Wellington Hampshire, MD;  Location: [facility];  Service: Cardiovascular;  Laterality: N/A;   Family History  Problem Relation Age of Onset  . Hypertension Mother   . Congestive Heart Failure Mother   . Congestive Heart Failure Father    Social History  Substance Use Topics  . Smoking status: Former Smoker    Years: 10.00    Types: Cigarettes    Start date: 09/14/1975    Quit date: 06/22/1986  . Smokeless tobacco: Never Used  . Alcohol use No   Allergies  Allergen Reactions  . Lovastatin     Other reaction(s): Muscle Pain   Prior to Admission medications   Medication Sig Start Date End Date Taking? Authorizing Provider  acetaminophen (TYLENOL) 500 MG chewable tablet Chew 500 mg by mouth every 6 (six) hours as needed for pain.   Yes [provider]  aspirin EC 81 MG tablet Take 81 mg  by mouth daily.   Yes [provider]  atorvastatin (LIPITOR) 40 MG tablet Take 1 tablet (40 mg total) by mouth daily. 03/15/17  Yes Sowles, Drue Stager, MD  digoxin (LANOXIN) 0.125 MG tablet Take 1 tablet (0.125 mg total) by mouth daily. 04/24/17  Yes Demetrios Loll, MD  fluticasone [facility]) 50 MCG/ACT nasal spray USE 2 SPRAYS IN EACH  NOSTRIL DAILY 10/21/16  Yes Sowles, Drue Stager, MD  furosemide (LASIX) 40 MG tablet Take 40 mg by mouth daily.   Yes [provider]  potassium chloride SA (K-DUR,KLOR-CON) 20 MEQ tablet Take 20 mEq by mouth daily.   Yes [provider]  sacubitril-valsartan (ENTRESTO) 24-26 MG Take 1 tablet by mouth 2 (two) times daily. 05/26/17  Yes Darylene Price A, FNP  spironolactone (ALDACTONE) 25 MG tablet Take 1 tablet (25 mg total) by mouth daily. 04/24/17  Yes Demetrios Loll, MD  carvedilol (COREG) 6.25 MG tablet Take 1 tablet (6.25 mg total) by mouth 2 (two) times daily with a meal. 06/09/17   Alisa Graff, FNP    Review of Systems  Constitutional: Positive for fatigue. Negative for appetite change.  HENT: Negative for congestion, postnasal drip and sore throat.   Eyes: Negative.   Respiratory: Positive for shortness of breath (minimal). Negative for cough and chest tightness.   Cardiovascular: Negative for chest pain, palpitations and leg swelling.  Gastrointestinal: Negative for abdominal distention and abdominal pain.  Endocrine: Negative.   Genitourinary: Negative.   Musculoskeletal: Negative for back pain and neck pain.  Skin: Negative.   Allergic/Immunologic: Negative.   Neurological: Negative for dizziness and light-headedness.  Hematological: Negative for adenopathy. Does not bruise/bleed easily.  Psychiatric/Behavioral: Negative for dysphoric mood and sleep disturbance (sleeping on 1-2 pillows). The patient is not nervous/anxious.    Vitals:   06/09/17 1118  BP: (!) 145/47  Pulse: 71  Resp: 18  SpO2: 100%  Weight: 248 lb (112.5 kg)  Height: 5\' 6"  (1.676 m)   Wt Readings from Last 3 Encounters:  06/09/17 248 lb (112.5 kg)  05/31/17 246 lb 8 oz (111.8 kg)  05/26/17 246 lb (111.6 kg)    Lab Results  Component Value Date   CREATININE 1.32 (H) 05/18/2017   CREATININE 1.15 (H) 05/09/2017   CREATININE 1.01 (H) 04/23/2017   Physical Exam  Constitutional: She is oriented to person, place, and time. She appears well-developed and well-nourished.  HENT:  Head: Normocephalic and atraumatic.  Neck: Normal range of motion. Neck supple. No JVD present.  Cardiovascular: Normal rate and regular rhythm.    Pulmonary/Chest: Effort normal. She has no wheezes. She has no rales.  Abdominal: Soft. She exhibits no distension. There is no tenderness.  Musculoskeletal: She exhibits no edema or tenderness.  Neurological: She is alert and oriented to person, place, and time.  Skin: Skin is warm and dry.  Psychiatric: She has a normal mood and affect. Her behavior is normal. Thought content normal.  Nursing note and vitals reviewed.  Assessment & Plan:  1: Chronic heart failure with reduced ejection fraction- - NYHA class II - Euvolemic today - continues to weigh daily. Reminded to call for an overnight weight gain of >2 pounds or a weekly weight gain of >5 pounds - Not adding salt and has been reading food labels; using no sodium seasoning such as Mrs. Dash.  - Does wear support socks  - tolerating entresto without known side effects; consider increasing entresto in the future if her BP allows - no edema since  diuretic decreased - saw cardiologist Sharolyn Douglas) 05/31/17  - BMP drawn today  2: HTN- - BP good today   - denies dizziness  Medication bottles reviewed.   Return here in 1 month or sooner for any questions/problems before then.

## 2017-06-22 ENCOUNTER — Other Ambulatory Visit: Payer: Self-pay | Admitting: Family

## 2017-07-11 ENCOUNTER — Ambulatory Visit: Payer: Medicare Other | Attending: Family | Admitting: Family

## 2017-07-11 ENCOUNTER — Other Ambulatory Visit
Admission: RE | Admit: 2017-07-11 | Discharge: 2017-07-11 | Disposition: A | Discharge status: Home / Self Care | Payer: Medicare Other | Source: Ambulatory Visit | Attending: Family | Admitting: Family

## 2017-07-11 ENCOUNTER — Ambulatory Visit: Payer: Medicare Other | Admitting: Family

## 2017-07-11 ENCOUNTER — Encounter: Payer: Self-pay | Admitting: Family

## 2017-07-11 VITALS — BP 129/63 | HR 70 | Resp 18 | Ht 66.0 in | Wt 246.4 lb

## 2017-07-11 DIAGNOSIS — Z7982 Long term (current) use of aspirin: Secondary | ICD-10-CM | POA: Insufficient documentation

## 2017-07-11 DIAGNOSIS — I10 Essential (primary) hypertension: Secondary | ICD-10-CM

## 2017-07-11 DIAGNOSIS — I5022 Chronic systolic (congestive) heart failure: Secondary | ICD-10-CM | POA: Diagnosis present

## 2017-07-11 DIAGNOSIS — I2510 Atherosclerotic heart disease of native coronary artery without angina pectoris: Secondary | ICD-10-CM | POA: Insufficient documentation

## 2017-07-11 DIAGNOSIS — I447 Left bundle-branch block, unspecified: Secondary | ICD-10-CM | POA: Diagnosis not present

## 2017-07-11 DIAGNOSIS — I110 Hypertensive heart disease with heart failure: Secondary | ICD-10-CM | POA: Diagnosis present

## 2017-07-11 DIAGNOSIS — I2721 Secondary pulmonary arterial hypertension: Secondary | ICD-10-CM | POA: Insufficient documentation

## 2017-07-11 DIAGNOSIS — Z79899 Other long term (current) drug therapy: Secondary | ICD-10-CM | POA: Insufficient documentation

## 2017-07-11 DIAGNOSIS — I4430 Unspecified atrioventricular block: Secondary | ICD-10-CM | POA: Diagnosis not present

## 2017-07-11 DIAGNOSIS — J449 Chronic obstructive pulmonary disease, unspecified: Secondary | ICD-10-CM | POA: Diagnosis not present

## 2017-07-11 DIAGNOSIS — E785 Hyperlipidemia, unspecified: Secondary | ICD-10-CM | POA: Insufficient documentation

## 2017-07-11 DIAGNOSIS — Z87891 Personal history of nicotine dependence: Secondary | ICD-10-CM | POA: Insufficient documentation

## 2017-07-11 DIAGNOSIS — Z8541 Personal history of malignant neoplasm of cervix uteri: Secondary | ICD-10-CM | POA: Insufficient documentation

## 2017-07-11 DIAGNOSIS — K766 Portal hypertension: Secondary | ICD-10-CM | POA: Insufficient documentation

## 2017-07-11 LAB — BASIC METABOLIC PANEL
Anion gap: 9 (ref 5–15)
BUN: 20 mg/dL (ref 6–20)
CALCIUM: 9.8 mg/dL (ref 8.9–10.3)
CO2: 25 mmol/L (ref 22–32)
CREATININE: 0.97 mg/dL (ref 0.44–1.00)
Chloride: 105 mmol/L (ref 101–111)
GFR calc Af Amer: >60 mL/min (ref >60)
GFR, EST NON AFRICAN AMERICAN: 55 mL/min — AB (ref >60)
GLUCOSE: 102 mg/dL — AB (ref 65–99)
Potassium: 4.1 mmol/L (ref 3.5–5.1)
SODIUM: 139 mmol/L (ref 135–145)

## 2017-07-11 LAB — DIGOXIN LEVEL: DIGOXIN LVL: 0.7 ng/mL — AB (ref 0.8–2.0)

## 2017-07-11 NOTE — Patient Instructions (Signed)
Continue weighing daily and call for an overnight weight gain of > 2 pounds or a weekly weight gain of >5 pounds. 

## 2017-07-11 NOTE — Progress Notes (Signed)
Patient ID: Kimberly Henry, female    DOB: June 03, 1941, 76 y.o.   MRN: [id number]  HPI  Kimberly Henry is a 76 y/o female with a history of AV block, COPD, hyperlipidemia, HTN, osteoarthritis, remote tobacco use and chronic heart failure.  Echo from 04/19/17 reviewed and shows an EF of 30-35% along with mild MR and moderate TR. Right/Left Cardiac catheterization was done 04/21/17 and showed mild nonobstructive CAD with an EF of 25-35%. Moderately elevated filling pressure, severe pulmonary HTN and severely reduced cardiac output at 3.14 with a cardiac index of 1.38. PA pressure was 61/34 with a mean of 47 mm Hg.   Admitted 04/19/17 due to heart failure. Initially needed IV diuretics with transition to oral diuretics. Cardiology consult obtained. Recommend outpatient sleep study. Elevated troponin thought to be due to demand ischemia. Discharged home after 4 days. Was in the ED 04/15/17 due to HF and she was treated/released.   She presents today for a follow up visit with a chief complaint of minimal shortness of breath upon moderate exertion. She describes this as chronic in nature having been present for several years with varying levels of severity. She denies any fatigue, chest pain, cough, edema, palpitations, dizziness or weight gain.   Past Medical History:  Diagnosis Date  . Acid phosphatase elevated   . AV block, 1st degree   . COPD (chronic obstructive pulmonary disease) ([place])   . HFrEF (heart failure with reduced ejection fraction) ([place])    a. 04/2017 Echo: EF 30-35%, antsept HK, mild MR, mod dil LA, mildly dil RA, mod TR, mildly to mod elev PASP.  Marland Kitchen Hx of cervical malignancy   . Hyperlipidemia   . Hypertension   . LBBB (left bundle branch block)   . Metabolic syndrome   . NICM (nonischemic cardiomyopathy) ([place])    a. 04/2017 Echo: EF 30-35%; b. 04/2017 Cath: nonobs dzs.  . Non-obstructive CAD (coronary artery disease)    a. 04/2017 Cath: mild nonobs CAD. EF 25-35%. CO 3.14, CI 1.38. Sev PAH  [61/34(47].  . Obesity, Class III, BMI 40-49.9 (morbid obesity) ([place])   . Osteoarthritis of both knees   . PAH (pulmonary arterial hypertension) with portal hypertension ([place])    a. 04/2017 Right Heart Cath: Sev PAH 61/34(47).   Past Surgical History:  Procedure Laterality Date  . ABDOMINAL HYSTERECTOMY  1996  . CATARACT EXTRACTION Left 2008  . RIGHT/LEFT HEART CATH AND CORONARY ANGIOGRAPHY N/A 04/21/2017   Procedure: RIGHT/LEFT HEART CATH AND CORONARY ANGIOGRAPHY;  Surgeon: Wellington Hampshire, MD;  Location: [facility];  Service: Cardiovascular;  Laterality: N/A;   Family History  Problem Relation Age of Onset  . Hypertension Mother   . Congestive Heart Failure Mother   . Congestive Heart Failure Father    Social History  Substance Use Topics  . Smoking status: Former Smoker    Years: 10.00    Types: Cigarettes    Start date: 09/14/1975    Quit date: 06/22/1986  . Smokeless tobacco: Never Used  . Alcohol use No   Allergies  Allergen Reactions  . Lovastatin     Other reaction(s): Muscle Pain   Prior to Admission medications   Medication Sig Start Date End Date Taking? Authorizing Provider  acetaminophen (TYLENOL) 500 MG chewable tablet Chew 500 mg by mouth every 6 (six) hours as needed for pain.   Yes [provider]  aspirin EC 81 MG tablet Take 81 mg by mouth daily.   Yes [provider]  atorvastatin (LIPITOR) 40 MG tablet Take 1 tablet (40 mg total) by mouth daily. 03/15/17  Yes Sowles, Drue Stager, MD  carvedilol (COREG) 6.25 MG tablet Take 1 tablet (6.25 mg total) by mouth 2 (two) times daily with a meal. 06/09/17  Yes Alisa Graff, FNP  DIGOX 125 MCG tablet take 1 tablet by mouth once daily 06/23/17  Yes Dallin Mccorkel A, FNP  fluticasone (FLONASE) 50 MCG/ACT nasal spray USE 2 SPRAYS IN EACH  NOSTRIL DAILY 10/21/16  Yes Sowles, Drue Stager, MD  furosemide (LASIX) 40 MG tablet Take 40 mg by mouth daily.   Yes [provider]  potassium chloride SA  (K-DUR,KLOR-CON) 20 MEQ tablet Take 1 tablet (20 mEq total) by mouth daily. 06/23/17  Yes Lucero Auzenne A, FNP  sacubitril-valsartan (ENTRESTO) 24-26 MG Take 1 tablet by mouth 2 (two) times daily. 05/26/17  Yes Darylene Price A, FNP  spironolactone (ALDACTONE) 25 MG tablet take 1 tablet by mouth once daily 06/23/17  Yes Alisa Graff, FNP   Review of Systems  Constitutional: Negative for appetite change and fatigue.  HENT: Negative for congestion, postnasal drip and sore throat.   Eyes: Negative.   Respiratory: Positive for shortness of breath (minimal). Negative for cough and chest tightness.   Cardiovascular: Negative for chest pain, palpitations and leg swelling.  Gastrointestinal: Negative for abdominal distention and abdominal pain.  Endocrine: Negative.   Genitourinary: Negative.   Musculoskeletal: Negative for back pain and neck pain.  Skin: Negative.   Allergic/Immunologic: Negative.   Neurological: Negative for dizziness and light-headedness.  Hematological: Negative for adenopathy. Does not bruise/bleed easily.  Psychiatric/Behavioral: Negative for dysphoric mood and sleep disturbance (sleeping on 1-2 pillows). The patient is not nervous/anxious.    Vitals:   07/11/17 1533  BP: 129/63  Pulse: 70  Resp: 18  SpO2: 100%  Weight: 246 lb 6 oz (111.8 kg)  Height: 5\' 6"  (1.676 m)   Wt Readings from Last 3 Encounters:  07/11/17 246 lb 6 oz (111.8 kg)  06/09/17 248 lb (112.5 kg)  05/31/17 246 lb 8 oz (111.8 kg)    Lab Results  Component Value Date   CREATININE 0.87 06/09/2017   CREATININE 1.32 (H) 05/18/2017   CREATININE 1.15 (H) 05/09/2017   Physical Exam  Constitutional: She is oriented to person, place, and time. She appears well-developed and well-nourished.  HENT:  Head: Normocephalic and atraumatic.  Neck: Normal range of motion. Neck supple. No JVD present.  Cardiovascular: Normal rate and regular rhythm.   Pulmonary/Chest: Effort normal. She has no wheezes. She  has no rales.  Abdominal: Soft. She exhibits no distension. There is no tenderness.  Musculoskeletal: She exhibits edema (1+ pitting edema in bilateral lower legs). She exhibits no tenderness.  Neurological: She is alert and oriented to person, place, and time.  Skin: Skin is warm and dry.  Psychiatric: She has a normal mood and affect. Her behavior is normal. Thought content normal.  Nursing note and vitals reviewed.  Assessment & Plan:  1: Chronic heart failure with reduced ejection fraction- - NYHA class II - Euvolemic today - continues to weigh daily. Reminded to call for an overnight weight gain of >2 pounds or a weekly weight gain of >5 pounds - Not adding salt and has been reading food labels; using no sodium seasoning such as Mrs. Dash.  - Does wear support socks & elevates her legs; edema is intermittent - will increase her entresto to 49/51mg  twice daily. She will finish out  her current dose and then begin the higher dose - saw cardiologist Sharolyn Douglas) 05/31/17  - BMP drawn today - dig level drawn today  - patient reports receiving flu vaccine for this season  2: HTN- - BP good today   - denies dizziness  Medication bottles reviewed.   Return here in 6 weeks or sooner for any questions/problems before then.

## 2017-07-13 MED ORDER — SACUBITRIL-VALSARTAN 49-51 MG PO TABS: 1.0000 | ORAL_TABLET | Freq: Two times a day (BID) | ORAL | 3 refills | Status: DC

## 2017-07-13 MED ORDER — SACUBITRIL-VALSARTAN 49-51 MG PO TABS: 1.0000 | ORAL_TABLET | Freq: Two times a day (BID) | ORAL | 5 refills | Status: DC

## 2017-07-13 MED ORDER — CARVEDILOL 6.25 MG PO TABS: 6.2500 mg | ORAL_TABLET | Freq: Two times a day (BID) | ORAL | 3 refills | Status: DC

## 2017-08-09 ENCOUNTER — Other Ambulatory Visit: Payer: Medicare Other

## 2017-08-12 ENCOUNTER — Ambulatory Visit: Payer: Medicare Other | Admitting: Cardiovascular Disease

## 2017-08-25 ENCOUNTER — Other Ambulatory Visit: Payer: Self-pay

## 2017-08-25 MED ORDER — FUROSEMIDE 40 MG PO TABS: 40.0000 mg | ORAL_TABLET | Freq: Every day | ORAL | 3 refills | Status: DC

## 2017-08-26 ENCOUNTER — Ambulatory Visit (INDEPENDENT_AMBULATORY_CARE_PROVIDER_SITE_OTHER): Payer: Medicare Other

## 2017-08-26 ENCOUNTER — Other Ambulatory Visit: Payer: Self-pay

## 2017-08-26 DIAGNOSIS — I428 Other cardiomyopathies: Secondary | ICD-10-CM | POA: Diagnosis not present

## 2017-08-26 DIAGNOSIS — I5022 Chronic systolic (congestive) heart failure: Secondary | ICD-10-CM | POA: Diagnosis not present

## 2017-08-29 ENCOUNTER — Ambulatory Visit (INDEPENDENT_AMBULATORY_CARE_PROVIDER_SITE_OTHER): Payer: Medicare Other | Admitting: Cardiovascular Disease

## 2017-08-29 ENCOUNTER — Encounter: Payer: Self-pay | Admitting: Cardiovascular Disease

## 2017-08-29 ENCOUNTER — Ambulatory Visit: Payer: Medicare Other | Admitting: Family

## 2017-08-29 VITALS — BP 120/70 | HR 73 | Ht 66.0 in | Wt 250.5 lb

## 2017-08-29 DIAGNOSIS — I5022 Chronic systolic (congestive) heart failure: Secondary | ICD-10-CM | POA: Diagnosis not present

## 2017-08-29 DIAGNOSIS — E785 Hyperlipidemia, unspecified: Secondary | ICD-10-CM

## 2017-08-29 DIAGNOSIS — I10 Essential (primary) hypertension: Secondary | ICD-10-CM | POA: Diagnosis not present

## 2017-08-29 NOTE — Progress Notes (Signed)
Cardiology Office Note   Date:  08/29/2017   ID:  Kimberly, Henry [birth year], MRN [id number]  PCP:  Steele Sizer, MD  Cardiologist:   Kathlyn Sacramento, MD   Chief Complaint  Patient presents with  . Other    3 month f/u echo no complaints today. Meds reviewed verbally with pt.      History of Present Illness: Kimberly Henry is a 76 y.o. female who is here today for follow-up visit regarding chronic systolic heart failure due to nonischemic cardiomyopathy. She has known history of essential hypertension, obesity, COPD, hyperlipidemia and osteoarthritis.She is a previous smoker but quit many years ago. She was diagnosed with systolic heart failure in August of this year.  She was noted to have left bundle branch block.  Cardiac catheterization showed mild nonobstructive coronary artery disease with an EF of 25-30%. Right heart catheterization showed severely elevated filling pressures, severely reduced cardiac output and severe pulmonary hypertension. The patient was treated medically with gradual improvement in symptoms.  She is taking her medications regularly with no side effects.  She reports significant improvement in shortness of breath. She had a repeat echocardiogram recently which showed an EF of 30-35%, mild to moderate mitral regurgitation and normal pulmonary pressure.  Past Medical History:  Diagnosis Date  . Acid phosphatase elevated   . AV block, 1st degree   . CHF (congestive heart failure) ([place])   . COPD (chronic obstructive pulmonary disease) ([place])   . HFrEF (heart failure with reduced ejection fraction) ([place])    a. 04/2017 Echo: EF 30-35%, antsept HK, mild MR, mod dil LA, mildly dil RA, mod TR, mildly to mod elev PASP.  Marland Kitchen Hx of cervical malignancy   . Hyperlipidemia   . Hypertension   . LBBB (left bundle branch block)   . Metabolic syndrome   . NICM (nonischemic cardiomyopathy) ([place])    a. 04/2017 Echo: EF 30-35%; b. 04/2017 Cath: nonobs dzs.  .  Non-obstructive CAD (coronary artery disease)    a. 04/2017 Cath: mild nonobs CAD. EF 25-35%. CO 3.14, CI 1.38. Sev PAH [61/34(47].  . Obesity, Class III, BMI 40-49.9 (morbid obesity) ([place])   . Osteoarthritis of both knees   . PAH (pulmonary arterial hypertension) with portal hypertension ([place])    a. 04/2017 Right Heart Cath: Sev PAH 61/34(47).    Past Surgical History:  Procedure Laterality Date  . ABDOMINAL HYSTERECTOMY  1996  . CATARACT EXTRACTION Left 2008  . RIGHT/LEFT HEART CATH AND CORONARY ANGIOGRAPHY N/A 04/21/2017   Procedure: RIGHT/LEFT HEART CATH AND CORONARY ANGIOGRAPHY;  Surgeon: Wellington Hampshire, MD;  Location: [facility];  Service: Cardiovascular;  Laterality: N/A;     Current Outpatient Medications  Medication Sig Dispense Refill  . acetaminophen (TYLENOL) 500 MG chewable tablet Chew 500 mg by mouth every 6 (six) hours as needed for pain.    Marland Kitchen aspirin EC 81 MG tablet Take 81 mg by mouth daily.    Marland Kitchen atorvastatin (LIPITOR) 40 MG tablet Take 1 tablet (40 mg total) by mouth daily. 90 tablet 3  . carvedilol (COREG) 6.25 MG tablet Take 1 tablet (6.25 mg total) by mouth 2 (two) times daily with a meal. 180 tablet 3  . DIGOX 125 MCG tablet take 1 tablet by mouth once daily 30 tablet 5  . fluticasone (FLONASE) 50 MCG/ACT nasal spray USE 2 SPRAYS IN EACH  NOSTRIL DAILY 48 g 0  . furosemide (LASIX) 40 MG tablet Take 1 tablet (40  mg total) by mouth daily. 30 tablet 3  . potassium chloride SA (K-DUR,KLOR-CON) 20 MEQ tablet Take 1 tablet (20 mEq total) by mouth daily. 30 tablet 5  . sacubitril-valsartan (ENTRESTO) 49-51 MG Take 1 tablet by mouth 2 (two) times daily. 180 tablet 3  . spironolactone (ALDACTONE) 25 MG tablet take 1 tablet by mouth once daily 30 tablet 5   No current facility-administered medications for this visit.     Allergies:   Lovastatin    Social History:  The patient  reports that she quit smoking about 31 years ago. Her smoking use included  cigarettes. She started smoking about 41 years ago. She quit after 10.00 years of use. she has never used smokeless tobacco. She reports that she does not drink alcohol or use drugs.   Family History:  The patient's family history includes Congestive Heart Failure in her father and mother; Hypertension in her mother.    ROS:  Please see the history of present illness.   Otherwise, review of systems are positive for none.   All other systems are reviewed and negative.    PHYSICAL EXAM: VS:  BP 120/70 (BP Location: Left Arm, Patient Position: Sitting, Cuff Size: Large)   Pulse 73   Ht 5\' 6"  (1.676 m)   Wt 250 lb 8 oz (113.6 kg)   BMI 40.43 kg/m  , BMI Body mass index is 40.43 kg/m. GEN: Well nourished, well developed, in no acute distress  HEENT: normal  Neck: no carotid bruits, or masses. No significant JVD. Cardiac: RRR; no murmurs, rubs, or gallops, no edema Respiratory: Clear to auscultation bilaterally, normal work of breathing GI: soft, nontender, nondistended, + BS MS: no deformity or atrophy  Skin: warm and dry, no rash Neuro:  Strength and sensation are intact Psych: euthymic mood, full affect   EKG:  EKG is ordered today. The ekg ordered today demonstrates normal sinus rhythm with left bundle branch block   Recent Labs: 04/19/2017: ALT 34; B Natriuretic Peptide 1,000.0; TSH 8.043 04/22/2017: Hemoglobin 11.6; Platelets 258 04/23/2017: Magnesium 1.9 07/11/2017: BUN 20; Creatinine, Ser 0.97; Potassium 4.1; Sodium 139    Lipid Panel    Component Value Date/Time   CHOL 173 03/02/2017 1242   TRIG 108 03/02/2017 1242   HDL 40 (L) 03/02/2017 1242   CHOLHDL 4.3 03/02/2017 1242   VLDL 22 03/02/2017 1242   LDLCALC 111 (H) 03/02/2017 1242      Wt Readings from Last 3 Encounters:  08/29/17 250 lb 8 oz (113.6 kg)  07/11/17 246 lb 6 oz (111.8 kg)  06/09/17 248 lb (112.5 kg)       No flowsheet data found.    ASSESSMENT AND PLAN:  1.  Chronic systolic heart  failure: Due to nonischemic cardiomyopathy.  Currently New York Heart Association class II.  She is euvolemic on current dose of furosemide.  She is on optimal medical therapy.  Repeat echocardiogram showed persistent cardiomyopathy with an EF of 30-35%.  I discussed with her the option of an ICD/CRT especially that she has underlying left bundle branch block.  The patient prefers to wait longer on medical therapy before making this decision. We will plan on seeing her again in 3 months and obtain an echocardiogram shortly after that to reevaluate ejection fraction.  2. Essential hypertension: Blood pressure is well controlled on current medications.  3. Hyperlipidemia: Continue atorvastatin.    Disposition:   FU in 3 months  Signed,  Kathlyn Sacramento, MD  08/29/2017 1:05 PM  [facility]

## 2017-08-29 NOTE — Patient Instructions (Signed)
Medication Instructions: Continue same medications.   Labwork: None.   Procedures/Testing: None.   Follow-Up: 3 months with Gerald Stabs or Thurmond Butts.   Any Additional Special Instructions Will Be Listed Below (If Applicable).     If you need a refill on your cardiac medications before your next appointment, please call your pharmacy.

## 2017-09-22 ENCOUNTER — Ambulatory Visit: Payer: Medicare Other | Admitting: Family

## 2017-09-26 NOTE — Progress Notes (Signed)
Patient ID: Kimberly Henry, female    DOB: 01-28-1941, 77 y.o.   MRN: [id number]  HPI  Kimberly Henry is a 77 y/o female with a history of AV block, COPD, hyperlipidemia, HTN, osteoarthritis, remote tobacco use and chronic heart failure.  Echo report from 08/26/17 reviewed and showed an EF of 30-35% along with moderate MR and normal PA pressure. Echo from 04/19/17 reviewed and shows an EF of 30-35% along with mild MR and moderate TR. Right/Left Cardiac catheterization was done 04/21/17 and showed mild nonobstructive CAD with an EF of 25-35%. Moderately elevated filling pressure, severe pulmonary HTN and severely reduced cardiac output at 3.14 with a cardiac index of 1.38. PA pressure was 61/34 with a mean of 47 mm Hg.   Admitted 04/19/17 due to heart failure. Initially needed IV diuretics with transition to oral diuretics. Cardiology consult obtained. Recommend outpatient sleep study. Elevated troponin thought to be due to demand ischemia. Discharged home after 4 days. Was in the ED 04/15/17 due to HF and she was treated/released.   She presents today for a follow up visit with a chief complaint of minimal fatigue upon moderate exertion. She describes this as chronic in nature having been present for several years with varying levels of severity. She denies any chest pain, cough, shortness of breath, edema, palpitations, abdominal distention, dizziness, difficulty sleeping or weight gain.   Past Medical History:  Diagnosis Date  . Acid phosphatase elevated   . AV block, 1st degree   . CHF (congestive heart failure) ([place])   . COPD (chronic obstructive pulmonary disease) ([place])   . HFrEF (heart failure with reduced ejection fraction) ([place])    a. 04/2017 Echo: EF 30-35%, antsept HK, mild MR, mod dil LA, mildly dil RA, mod TR, mildly to mod elev PASP.  Marland Kitchen Hx of cervical malignancy   . Hyperlipidemia   . Hypertension   . LBBB (left bundle branch block)   . Metabolic syndrome   . NICM (nonischemic  cardiomyopathy) ([place])    a. 04/2017 Echo: EF 30-35%; b. 04/2017 Cath: nonobs dzs.  . Non-obstructive CAD (coronary artery disease)    a. 04/2017 Cath: mild nonobs CAD. EF 25-35%. CO 3.14, CI 1.38. Sev PAH [61/34(47].  . Obesity, Class III, BMI 40-49.9 (morbid obesity) ([place])   . Osteoarthritis of both knees   . PAH (pulmonary arterial hypertension) with portal hypertension ([place])    a. 04/2017 Right Heart Cath: Sev PAH 61/34(47).   Past Surgical History:  Procedure Laterality Date  . ABDOMINAL HYSTERECTOMY  1996  . CATARACT EXTRACTION Left 2008  . RIGHT/LEFT HEART CATH AND CORONARY ANGIOGRAPHY N/A 04/21/2017   Procedure: RIGHT/LEFT HEART CATH AND CORONARY ANGIOGRAPHY;  Surgeon: Wellington Hampshire, MD;  Location: [facility];  Service: Cardiovascular;  Laterality: N/A;   Family History  Problem Relation Age of Onset  . Hypertension Mother   . Congestive Heart Failure Mother   . Congestive Heart Failure Father    Social History   Tobacco Use  . Smoking status: Former Smoker    Years: 10.00    Types: Cigarettes    Start date: 09/14/1975    Last attempt to quit: 06/22/1986    Years since quitting: 31.2  . Smokeless tobacco: Never Used  Substance Use Topics  . Alcohol use: No    Alcohol/week: 0.0 oz   Allergies  Allergen Reactions  . Lovastatin     Other reaction(s): Muscle Pain   Prior to Admission medications   Medication  Sig Start Date End Date Taking? Authorizing Provider  acetaminophen (TYLENOL) 500 MG chewable tablet Chew 500 mg by mouth every 6 (six) hours as needed for pain.   Yes [provider]  aspirin EC 81 MG tablet Take 81 mg by mouth daily.   Yes [provider]  atorvastatin (LIPITOR) 40 MG tablet Take 1 tablet (40 mg total) by mouth daily. 03/15/17  Yes Sowles, Drue Stager, MD  carvedilol (COREG) 6.25 MG tablet Take 1 tablet (6.25 mg total) by mouth 2 (two) times daily with a meal. 07/13/17  Yes Alisa Graff, FNP  DIGOX 125 MCG tablet take 1  tablet by mouth once daily 06/23/17  Yes Detta Mellin A, FNP  furosemide (LASIX) 40 MG tablet Take 1 tablet (40 mg total) by mouth daily. 08/25/17  Yes Jannett Schmall, Otila Kluver A, FNP  potassium chloride SA (K-DUR,KLOR-CON) 20 MEQ tablet Take 1 tablet (20 mEq total) by mouth daily. 06/23/17  Yes Haaris Metallo A, FNP  sacubitril-valsartan (ENTRESTO) 49-51 MG Take 1 tablet by mouth 2 (two) times daily. 07/13/17  Yes Darylene Price A, FNP  spironolactone (ALDACTONE) 25 MG tablet take 1 tablet by mouth once daily 06/23/17  Yes Beni Turrell A, FNP  fluticasone (FLONASE) 50 MCG/ACT nasal spray USE 2 SPRAYS IN [facility]  NOSTRIL DAILY Patient not taking: Reported on 09/28/2017 10/21/16   Steele Sizer, MD    Review of Systems  Constitutional: Positive for fatigue. Negative for appetite change.  HENT: Negative for congestion, postnasal drip and sore throat.   Eyes: Negative.   Respiratory: Negative for cough, chest tightness and shortness of breath.   Cardiovascular: Negative for chest pain, palpitations and leg swelling.  Gastrointestinal: Negative for abdominal distention and abdominal pain.  Endocrine: Negative.   Genitourinary: Negative.   Musculoskeletal: Negative for back pain and neck pain.  Skin: Negative.   Allergic/Immunologic: Negative.   Neurological: Negative for dizziness and light-headedness.  Hematological: Negative for adenopathy. Does not bruise/bleed easily.  Psychiatric/Behavioral: Negative for dysphoric mood and sleep disturbance (sleeping on 1-2 pillows). The patient is not nervous/anxious.    Vitals:   09/28/17 1036  BP: (!) 136/58  Pulse: 63  Resp: 18  SpO2: 100%  Weight: 251 lb 2 oz (113.9 kg)  Height: 5\' 6"  (1.676 m)   Wt Readings from Last 3 Encounters:  09/28/17 251 lb 2 oz (113.9 kg)  08/29/17 250 lb 8 oz (113.6 kg)  07/11/17 246 lb 6 oz (111.8 kg)   Lab Results  Component Value Date   CREATININE 0.97 07/11/2017   CREATININE 0.87 06/09/2017   CREATININE 1.32 (H)  05/18/2017   Physical Exam  Constitutional: She is oriented to person, place, and time. She appears well-developed and well-nourished.  HENT:  Head: Normocephalic and atraumatic.  Neck: Normal range of motion. Neck supple. No JVD present.  Cardiovascular: Normal rate and regular rhythm.  Pulmonary/Chest: Effort normal. She has no wheezes. She has no rales.  Abdominal: Soft. She exhibits no distension. There is no tenderness.  Musculoskeletal: She exhibits edema (trace pitting edema in bilateral lower legs). She exhibits no tenderness.  Neurological: She is alert and oriented to person, place, and time.  Skin: Skin is warm and dry.  Psychiatric: She has a normal mood and affect. Her behavior is normal. Thought content normal.  Nursing note and vitals reviewed.  Assessment & Plan:  1: Chronic heart failure with reduced ejection fraction- - NYHA class II - Euvolemic today - continues to weigh daily. Reminded to call for  an overnight weight gain of >2 pounds or a weekly weight gain of >5 pounds - Not adding salt and has been reading food labels; using no sodium seasoning such as Mrs. Dash.  - Does wear support socks & elevates her legs; edema is intermittent - dig level on 07/11/17 was 0.7 - saw cardiology Fletcher Anon) 08/29/17  - patient reports receiving flu vaccine for this season - due to BMI, does not meet ReDS vest criteria - PharmD reconciled medications with the patient  2: HTN- - BP good today   - BMP from 07/01/17 reviewed and showed sodium 139, potassium 4.1 and GFR >60  Medication bottles reviewed.   Return in 1 year or sooner for any questions/problems before then.

## 2017-09-28 ENCOUNTER — Ambulatory Visit: Payer: Medicare Other | Attending: Family | Admitting: Family

## 2017-09-28 ENCOUNTER — Encounter: Payer: Self-pay | Admitting: Family

## 2017-09-28 ENCOUNTER — Other Ambulatory Visit: Payer: Self-pay | Admitting: Family

## 2017-09-28 VITALS — BP 136/58 | HR 63 | Resp 18 | Ht 66.0 in | Wt 251.1 lb

## 2017-09-28 DIAGNOSIS — E785 Hyperlipidemia, unspecified: Secondary | ICD-10-CM | POA: Diagnosis not present

## 2017-09-28 DIAGNOSIS — Z6841 Body Mass Index (BMI) 40.0 and over, adult: Secondary | ICD-10-CM | POA: Diagnosis not present

## 2017-09-28 DIAGNOSIS — I110 Hypertensive heart disease with heart failure: Secondary | ICD-10-CM | POA: Diagnosis not present

## 2017-09-28 DIAGNOSIS — I509 Heart failure, unspecified: Secondary | ICD-10-CM | POA: Diagnosis not present

## 2017-09-28 DIAGNOSIS — M1990 Unspecified osteoarthritis, unspecified site: Secondary | ICD-10-CM | POA: Insufficient documentation

## 2017-09-28 DIAGNOSIS — Z7982 Long term (current) use of aspirin: Secondary | ICD-10-CM | POA: Insufficient documentation

## 2017-09-28 DIAGNOSIS — Z8541 Personal history of malignant neoplasm of cervix uteri: Secondary | ICD-10-CM | POA: Insufficient documentation

## 2017-09-28 DIAGNOSIS — I700 Atherosclerosis of aorta: Secondary | ICD-10-CM

## 2017-09-28 DIAGNOSIS — Z87891 Personal history of nicotine dependence: Secondary | ICD-10-CM | POA: Insufficient documentation

## 2017-09-28 DIAGNOSIS — I447 Left bundle-branch block, unspecified: Secondary | ICD-10-CM | POA: Diagnosis not present

## 2017-09-28 DIAGNOSIS — J449 Chronic obstructive pulmonary disease, unspecified: Secondary | ICD-10-CM | POA: Insufficient documentation

## 2017-09-28 DIAGNOSIS — I2721 Secondary pulmonary arterial hypertension: Secondary | ICD-10-CM | POA: Insufficient documentation

## 2017-09-28 DIAGNOSIS — I4430 Unspecified atrioventricular block: Secondary | ICD-10-CM | POA: Diagnosis not present

## 2017-09-28 DIAGNOSIS — K766 Portal hypertension: Secondary | ICD-10-CM | POA: Insufficient documentation

## 2017-09-28 DIAGNOSIS — I5022 Chronic systolic (congestive) heart failure: Secondary | ICD-10-CM

## 2017-09-28 DIAGNOSIS — Z79899 Other long term (current) drug therapy: Secondary | ICD-10-CM | POA: Insufficient documentation

## 2017-09-28 DIAGNOSIS — I2510 Atherosclerotic heart disease of native coronary artery without angina pectoris: Secondary | ICD-10-CM | POA: Insufficient documentation

## 2017-09-28 DIAGNOSIS — I10 Essential (primary) hypertension: Secondary | ICD-10-CM

## 2017-09-28 MED ORDER — DIGOXIN 125 MCG PO TABS: 125.0000 ug | ORAL_TABLET | Freq: Every day | ORAL | 3 refills | Status: DC

## 2017-09-28 MED ORDER — SPIRONOLACTONE 25 MG PO TABS: 25.0000 mg | ORAL_TABLET | Freq: Every day | ORAL | 3 refills | Status: DC

## 2017-09-28 MED ORDER — FUROSEMIDE 40 MG PO TABS: 40.0000 mg | ORAL_TABLET | Freq: Every day | ORAL | 3 refills | Status: DC

## 2017-09-28 MED ORDER — POTASSIUM CHLORIDE CRYS ER 20 MEQ PO TBCR: 20.0000 meq | EXTENDED_RELEASE_TABLET | Freq: Every day | ORAL | 3 refills | Status: DC

## 2017-09-28 NOTE — Patient Instructions (Signed)
Continue weighing daily and call for an overnight weight gain of > 2 pounds or a weekly weight gain of >5 pounds. 

## 2017-09-30 ENCOUNTER — Other Ambulatory Visit: Payer: Self-pay | Admitting: Family

## 2017-09-30 MED ORDER — FUROSEMIDE 40 MG PO TABS: 40.0000 mg | ORAL_TABLET | Freq: Every day | ORAL | 0 refills | Status: DC

## 2017-12-01 ENCOUNTER — Telehealth: Payer: Self-pay

## 2017-12-01 NOTE — Telephone Encounter (Signed)
Copied from [place] [id number]. Topic: Quick Communication - See Telephone Encounter >> Dec 01, 2017  8:42 AM Hewitt Shorts wrote: CRM for notification. See Telephone encounter for: 12/01/17. Pt states  she has a head cold and is wanting to know if dr Ancil Boozer could call something in for her since sie is not available til 12/09/17  Best number [phone]  [facility] [address] [place]

## 2017-12-01 NOTE — Telephone Encounter (Signed)
She can try saline nasal spray, coricidin HBP or come in sooner to see one of our NP

## 2017-12-01 NOTE — Telephone Encounter (Signed)
Patient notified and will try OTC. If she does not get to feeling better will call back for an appt with NP.

## 2017-12-12 ENCOUNTER — Other Ambulatory Visit: Payer: Self-pay | Admitting: Family Medicine

## 2017-12-12 DIAGNOSIS — J3089 Other allergic rhinitis: Principal | ICD-10-CM

## 2017-12-12 DIAGNOSIS — J302 Other seasonal allergic rhinitis: Secondary | ICD-10-CM

## 2018-01-11 ENCOUNTER — Other Ambulatory Visit: Payer: Self-pay | Admitting: Family Medicine

## 2018-01-11 DIAGNOSIS — J3089 Other allergic rhinitis: Principal | ICD-10-CM

## 2018-01-11 DIAGNOSIS — J302 Other seasonal allergic rhinitis: Secondary | ICD-10-CM

## 2018-01-30 ENCOUNTER — Other Ambulatory Visit: Payer: Self-pay | Admitting: Family Medicine

## 2018-01-30 DIAGNOSIS — I700 Atherosclerosis of aorta: Secondary | ICD-10-CM

## 2018-02-08 ENCOUNTER — Other Ambulatory Visit: Payer: Self-pay

## 2018-02-08 ENCOUNTER — Emergency Department
Admission: EM | Admit: 2018-02-08 | Discharge: 2018-02-09 | Disposition: A | Discharge status: Home / Self Care | Payer: Medicare Other | Attending: Emergency Medicine | Admitting: Emergency Medicine

## 2018-02-08 ENCOUNTER — Encounter: Payer: Self-pay | Admitting: Emergency Medicine

## 2018-02-08 DIAGNOSIS — Z7982 Long term (current) use of aspirin: Secondary | ICD-10-CM | POA: Diagnosis not present

## 2018-02-08 DIAGNOSIS — I5022 Chronic systolic (congestive) heart failure: Secondary | ICD-10-CM | POA: Insufficient documentation

## 2018-02-08 DIAGNOSIS — J449 Chronic obstructive pulmonary disease, unspecified: Secondary | ICD-10-CM | POA: Insufficient documentation

## 2018-02-08 DIAGNOSIS — Z79899 Other long term (current) drug therapy: Secondary | ICD-10-CM | POA: Diagnosis not present

## 2018-02-08 DIAGNOSIS — I110 Hypertensive heart disease with heart failure: Secondary | ICD-10-CM | POA: Insufficient documentation

## 2018-02-08 DIAGNOSIS — Z87891 Personal history of nicotine dependence: Secondary | ICD-10-CM | POA: Insufficient documentation

## 2018-02-08 DIAGNOSIS — I10 Essential (primary) hypertension: Secondary | ICD-10-CM

## 2018-02-08 LAB — CBC
HEMATOCRIT: 32.3 % — AB (ref 35.0–47.0)
Hemoglobin: 11.5 g/dL — ABNORMAL LOW (ref 12.0–16.0)
MCH: 33.3 pg (ref 26.0–34.0)
MCHC: 35.4 g/dL (ref 32.0–36.0)
MCV: 94.1 fL (ref 80.0–100.0)
Platelets: 251 10*3/uL (ref 150–440)
RBC: 3.44 MIL/uL — AB (ref 3.80–5.20)
RDW: 12.6 % (ref 11.5–14.5)
WBC: 4.7 10*3/uL (ref 3.6–11.0)

## 2018-02-08 LAB — BASIC METABOLIC PANEL
Anion gap: 10 (ref 5–15)
BUN: 21 mg/dL — ABNORMAL HIGH (ref 6–20)
CHLORIDE: 106 mmol/L (ref 101–111)
CO2: 23 mmol/L (ref 22–32)
Calcium: 9.5 mg/dL (ref 8.9–10.3)
Creatinine, Ser: 0.82 mg/dL (ref 0.44–1.00)
GFR calc non Af Amer: >60 mL/min (ref >60)
Glucose, Bld: 135 mg/dL — ABNORMAL HIGH (ref 65–99)
POTASSIUM: 3.7 mmol/L (ref 3.5–5.1)
SODIUM: 139 mmol/L (ref 135–145)

## 2018-02-08 LAB — URINALYSIS, COMPLETE (UACMP) WITH MICROSCOPIC
BACTERIA UA: NONE SEEN
BILIRUBIN URINE: NEGATIVE
Glucose, UA: NEGATIVE mg/dL
KETONES UR: NEGATIVE mg/dL
Nitrite: NEGATIVE
PH: 5 (ref 5.0–8.0)
Protein, ur: NEGATIVE mg/dL
Specific Gravity, Urine: 1.021 (ref 1.005–1.030)

## 2018-02-08 LAB — TROPONIN I

## 2018-02-08 NOTE — ED Notes (Signed)
Pt was at work and a Marine scientist took her blood pressure and it was elevated. She thinks it was from eating sausage this am

## 2018-02-08 NOTE — ED Provider Notes (Signed)
[facility] Emergency Department Provider Note    First MD Initiated Contact with Patient 02/08/18 2351     (approximate)  I have reviewed the triage vital signs and the nursing notes.   HISTORY  Chief Complaint Hypertension    HPI Kimberly Henry is a 77 y.o. 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  Patient's systolic blood pressure was 156 tonight while at work.  Patient states that she did have a brief episode of lightheadedness and dizziness this morning which completely resolved.  Patient denies any chest pain no shortness of breath no weakness numbness gait instability or visual changes.   Past Medical History:  Diagnosis Date  . Acid phosphatase elevated   . AV block, 1st degree   . CHF (congestive heart failure) ([place])   . COPD (chronic obstructive pulmonary disease) ([place])   . HFrEF (heart failure with reduced ejection fraction) ([place])    a. 04/2017 Echo: EF 30-35%, antsept HK, mild MR, mod dil LA, mildly dil RA, mod TR, mildly to mod elev PASP.  Marland Kitchen Hx of cervical malignancy   . Hyperlipidemia   . Hypertension   . LBBB (left bundle branch block)   . Metabolic syndrome   . NICM (nonischemic cardiomyopathy) ([place])    a. 04/2017 Echo: EF 30-35%; b. 04/2017 Cath: nonobs dzs.  . Non-obstructive CAD (coronary artery disease)    a. 04/2017 Cath: mild nonobs CAD. EF 25-35%. CO 3.14, CI 1.38. Sev PAH [61/34(47].  . Obesity, Class III, BMI 40-49.9 (morbid obesity) ([place])   . Osteoarthritis of both knees   . PAH (pulmonary arterial hypertension) with portal hypertension ([place])    a. 04/2017 Right Heart Cath: Sev PAH 61/34(47).    Patient Active Problem List   Diagnosis Date Noted  . Chronic systolic heart failure ([place]) 04/28/2017  . Hypokalemia 04/19/2017  . Elevated troponin 04/19/2017  . Hyperglycemia 04/19/2017  . Aortic  atherosclerosis ([place]) 03/10/2017  . Anemia, unspecified 03/08/2017  . Cardiomegaly 03/04/2017  . Abnormal CXR 03/04/2017  . Perennial allergic rhinitis with seasonal variation 05/30/2015  . 1st degree AV block 03/25/2015  . Metabolic syndrome 02/72/5366  . History of cervical cancer 03/03/2015  . Hypertension, benign 03/03/2015  . Osteoarthritis, knee 03/03/2015  . Dyslipidemia 03/03/2015    Past Surgical History:  Procedure Laterality Date  . ABDOMINAL HYSTERECTOMY  1996  . CATARACT EXTRACTION Left 2008  . RIGHT/LEFT HEART CATH AND CORONARY ANGIOGRAPHY N/A 04/21/2017   Procedure: RIGHT/LEFT HEART CATH AND CORONARY ANGIOGRAPHY;  Surgeon: Wellington Hampshire, MD;  Location: [facility];  Service: Cardiovascular;  Laterality: N/A;    Prior to Admission medications   Medication Sig Start Date End Date Taking? Authorizing Provider  acetaminophen (TYLENOL) 500 MG chewable tablet Chew 500 mg by mouth every 6 (six) hours as needed for pain.    [provider]  aspirin EC 81 MG tablet Take 81 mg by mouth daily.    [provider]  atorvastatin (LIPITOR) 40 MG tablet Take 1 tablet (40 mg total) by mouth daily. 03/15/17   Steele Sizer, MD  carvedilol (COREG) 6.25 MG tablet Take 1 tablet (6.25 mg total) by mouth 2 (two) times daily with a meal. 07/13/17   Alisa Graff, FNP  digoxin (DIGOX) 0.125 MG tablet Take 1 tablet (125 mcg total) by mouth daily. 09/28/17   Alisa Graff, FNP  fluticasone (FLONASE) 50 MCG/ACT nasal spray USE 2 SPRAYS IN EACH  NOSTRIL DAILY Patient not taking: Reported on 09/28/2017 10/21/16   Steele Sizer, MD  furosemide (LASIX) 40 MG tablet take 1 tablet by mouth twice a day 09/30/17   Darylene Price A, FNP  potassium chloride SA (K-DUR,KLOR-CON) 20 MEQ tablet Take 1 tablet (20 mEq total) by mouth daily. 09/28/17   Alisa Graff, FNP  sacubitril-valsartan (ENTRESTO) 49-51 MG Take 1 tablet by mouth 2 (two) times daily. 07/13/17   Alisa Graff,  FNP  spironolactone (ALDACTONE) 25 MG tablet Take 1 tablet (25 mg total) by mouth daily. 09/28/17   Alisa Graff, FNP    Allergies Patient has no known allergies.  Family History  Problem Relation Age of Onset  . Hypertension Mother   . Congestive Heart Failure Mother   . Congestive Heart Failure Father     Social History Social History   Tobacco Use  . Smoking status: Former Smoker    Years: 10.00    Types: Cigarettes    Start date: 09/14/1975    Last attempt to quit: 06/22/1986    Years since quitting: 31.6  . Smokeless tobacco: Never Used  Substance Use Topics  . Alcohol use: No    Alcohol/week: 0.0 oz  . Drug use: No    Review of Systems Constitutional: No fever/chills Eyes: No visual changes. ENT: No sore throat. Cardiovascular: Denies chest pain. Respiratory: Denies shortness of breath. Gastrointestinal: No abdominal pain.  No nausea, no vomiting.  No diarrhea.  No constipation. Genitourinary: Negative for dysuria. Musculoskeletal: Negative for neck pain.  Negative for back pain. Integumentary: Negative for rash. Neurological: Negative for headaches, focal weakness or numbness.  Positive for dizziness (resolved)  ____________________________________________   PHYSICAL EXAM:  VITAL SIGNS: ED Triage Vitals  Enc Vitals Group     BP 02/08/18 2008 (!) 144/54     Pulse Rate 02/08/18 2008 65     Resp 02/08/18 2008 16     Temp 02/08/18 2008 98.4 F (36.9 C)     Temp Source 02/08/18 2008 Oral     SpO2 02/08/18 2008 100 %     Weight 02/08/18 2009 113.4 kg (250 lb)     Height 02/08/18 2009 1.651 m (5\' 5" )     Head Circumference --      Peak Flow --      Pain Score 02/08/18 2009 0     Pain Loc --      Pain Edu? --      Excl. in [place]? --     Constitutional: Alert and oriented. Well appearing and in no acute distress. Eyes: Conjunctivae are normal.  Head: Atraumatic. Mouth/Throat: Mucous membranes are moist. Oropharynx non-erythematous. Neck: No  stridor. Cardiovascular: Normal rate, regular rhythm. Good peripheral circulation. Grossly normal heart sounds. Respiratory: Normal respiratory effort.  No retractions. Lungs CTAB. Gastrointestinal: Soft and nontender. No distention.  Musculoskeletal: No lower extremity tenderness nor edema. No gross deformities of extremities. Neurologic:  Normal speech and language. No gross focal neurologic deficits are appreciated.  Skin:  Skin is warm, dry and intact. No rash noted. Psychiatric: Mood and affect are normal. Speech and behavior are normal.  ____________________________________________   LABS (all labs ordered are listed, but only abnormal results are displayed)  Labs Reviewed  BASIC METABOLIC PANEL - Abnormal; Notable for the following components:      Result Value   Glucose, Bld 135 (*)    BUN 21 (*)  All other components within normal limits  CBC - Abnormal; Notable for the following components:   RBC 3.44 (*)    Hemoglobin 11.5 (*)    HCT 32.3 (*)    All other components within normal limits  URINALYSIS, COMPLETE (UACMP) WITH MICROSCOPIC - Abnormal; Notable for the following components:   Color, Urine YELLOW (*)    APPearance HAZY (*)    Hgb urine dipstick MODERATE (*)    Leukocytes, UA MODERATE (*)    All other components within normal limits  TROPONIN I  CBG MONITORING, ED   ____________________________________________  EKG  ED ECG REPORT I, Yreka N BROWN, the attending physician, personally viewed and interpreted this ECG.   Date: 02/09/2018  EKG Time: 8:14 PM  Rate: 60  Rhythm: Normal sinus rhythm  Axis: Normal  Intervals: Normal  ST&T Change: None      INITIAL IMPRESSION / ASSESSMENT AND PLAN / ED COURSE  As part of my medical decision making, I reviewed the following data within the electronic MEDICAL RECORD NUMBER   77 year old female presenting with above-stated history and physical exam of asymptomatic hypertension current blood pressure 143/50.   Patient has no symptoms at present.  No focal neurological deficits noted on exam such imaging was not performed.  Patient requesting discharge at this time.  Patient advised to follow-up with primary care provider for further outpatient evaluation. ____________________________________________  FINAL CLINICAL IMPRESSION(S) / ED DIAGNOSES  Final diagnoses:  Hypertension, unspecified type     MEDICATIONS GIVEN DURING THIS VISIT:  Medications - No data to display   ED Discharge Orders    None       Note:  This document was prepared using Dragon voice recognition software and may include unintentional dictation errors.    Gregor Hams, MD 02/09/18 [phone]

## 2018-02-08 NOTE — ED Triage Notes (Signed)
Pt in via POV, reports hypertension x one day, reports being at work today, becoming light headed and dizzy, nurse at work checked BP, 952 systolic.  Vitals WDL, NAD noted at this time.

## 2018-02-21 ENCOUNTER — Encounter: Payer: Self-pay | Admitting: Family

## 2018-02-21 ENCOUNTER — Ambulatory Visit: Payer: Medicare Other | Attending: Family | Admitting: Family

## 2018-02-21 VITALS — BP 130/68 | HR 67 | Resp 18 | Ht 66.0 in | Wt 251.5 lb

## 2018-02-21 DIAGNOSIS — K766 Portal hypertension: Secondary | ICD-10-CM | POA: Diagnosis not present

## 2018-02-21 DIAGNOSIS — I440 Atrioventricular block, first degree: Secondary | ICD-10-CM | POA: Diagnosis not present

## 2018-02-21 DIAGNOSIS — Z7982 Long term (current) use of aspirin: Secondary | ICD-10-CM | POA: Diagnosis not present

## 2018-02-21 DIAGNOSIS — E785 Hyperlipidemia, unspecified: Secondary | ICD-10-CM | POA: Diagnosis not present

## 2018-02-21 DIAGNOSIS — Z90710 Acquired absence of both cervix and uterus: Secondary | ICD-10-CM | POA: Diagnosis not present

## 2018-02-21 DIAGNOSIS — Z8541 Personal history of malignant neoplasm of cervix uteri: Secondary | ICD-10-CM | POA: Diagnosis not present

## 2018-02-21 DIAGNOSIS — Z87891 Personal history of nicotine dependence: Secondary | ICD-10-CM | POA: Insufficient documentation

## 2018-02-21 DIAGNOSIS — M170 Bilateral primary osteoarthritis of knee: Secondary | ICD-10-CM | POA: Insufficient documentation

## 2018-02-21 DIAGNOSIS — Z6841 Body Mass Index (BMI) 40.0 and over, adult: Secondary | ICD-10-CM | POA: Diagnosis not present

## 2018-02-21 DIAGNOSIS — I5022 Chronic systolic (congestive) heart failure: Secondary | ICD-10-CM | POA: Insufficient documentation

## 2018-02-21 DIAGNOSIS — I110 Hypertensive heart disease with heart failure: Secondary | ICD-10-CM | POA: Insufficient documentation

## 2018-02-21 DIAGNOSIS — I10 Essential (primary) hypertension: Secondary | ICD-10-CM

## 2018-02-21 DIAGNOSIS — Z8249 Family history of ischemic heart disease and other diseases of the circulatory system: Secondary | ICD-10-CM | POA: Insufficient documentation

## 2018-02-21 DIAGNOSIS — I2510 Atherosclerotic heart disease of native coronary artery without angina pectoris: Secondary | ICD-10-CM | POA: Diagnosis not present

## 2018-02-21 DIAGNOSIS — I2721 Secondary pulmonary arterial hypertension: Secondary | ICD-10-CM | POA: Diagnosis not present

## 2018-02-21 DIAGNOSIS — Z79899 Other long term (current) drug therapy: Secondary | ICD-10-CM | POA: Diagnosis not present

## 2018-02-21 DIAGNOSIS — I700 Atherosclerosis of aorta: Secondary | ICD-10-CM

## 2018-02-21 DIAGNOSIS — J449 Chronic obstructive pulmonary disease, unspecified: Secondary | ICD-10-CM | POA: Insufficient documentation

## 2018-02-21 DIAGNOSIS — I509 Heart failure, unspecified: Secondary | ICD-10-CM | POA: Diagnosis present

## 2018-02-21 MED ORDER — FUROSEMIDE 40 MG PO TABS: 40.0000 mg | ORAL_TABLET | Freq: Every day | ORAL | 3 refills | Status: DC

## 2018-02-21 MED ORDER — ATORVASTATIN CALCIUM 40 MG PO TABS: 40.0000 mg | ORAL_TABLET | Freq: Every day | ORAL | 3 refills | Status: DC

## 2018-02-21 NOTE — Patient Instructions (Addendum)
Continue weighing daily and call for an overnight weight gain of > 2 pounds or a weekly weight gain of >5 pounds.  Increase entresto to 97/103mg  by finishing your current bottle by taking 2 tablets twice daily.

## 2018-02-21 NOTE — Progress Notes (Signed)
Patient ID: Kimberly Henry, female    DOB: 1940/09/30, 78 y.o.   MRN: [id number]  HPI  Kimberly Henry is a 77 y/o female with a history of AV block, COPD, hyperlipidemia, HTN, osteoarthritis, remote tobacco use and chronic heart failure.  Echo report from 08/26/17 reviewed and showed an EF of 30-35% along with moderate MR and normal PA pressure. Echo from 04/19/17 reviewed and shows an EF of 30-35% along with mild MR and moderate TR. Right/Left Cardiac catheterization was done 04/21/17 and showed mild nonobstructive CAD with an EF of 25-35%. Moderately elevated filling pressure, severe pulmonary HTN and severely reduced cardiac output at 3.14 with a cardiac index of 1.38. PA pressure was 61/34 with a mean of 47 mm Hg.   Was in the ED 02/08/18 due to HTN where she was evaluated and released.   She presents today for a follow up visit with a chief complaint of minimal fatigue upon moderate exertion. She describes this as chronic in nature having been present for several years with varying levels of severity. She has associated shortness of breath and dizziness along with this. She denies any chest pain, cough,  edema, palpitations, abdominal distention,  difficulty sleeping or weight gain.   Past Medical History:  Diagnosis Date  . Acid phosphatase elevated   . AV block, 1st degree   . CHF (congestive heart failure) ([place])   . COPD (chronic obstructive pulmonary disease) ([place])   . HFrEF (heart failure with reduced ejection fraction) ([place])    a. 04/2017 Echo: EF 30-35%, antsept HK, mild MR, mod dil LA, mildly dil RA, mod TR, mildly to mod elev PASP.  Marland Kitchen Hx of cervical malignancy   . Hyperlipidemia   . Hypertension   . LBBB (left bundle branch block)   . Metabolic syndrome   . NICM (nonischemic cardiomyopathy) ([place])    a. 04/2017 Echo: EF 30-35%; b. 04/2017 Cath: nonobs dzs.  . Non-obstructive CAD (coronary artery disease)    a. 04/2017 Cath: mild nonobs CAD. EF 25-35%. CO 3.14, CI 1.38. Sev PAH  [61/34(47].  . Obesity, Class III, BMI 40-49.9 (morbid obesity) ([place])   . Osteoarthritis of both knees   . PAH (pulmonary arterial hypertension) with portal hypertension ([place])    a. 04/2017 Right Heart Cath: Sev PAH 61/34(47).   Past Surgical History:  Procedure Laterality Date  . ABDOMINAL HYSTERECTOMY  1996  . CATARACT EXTRACTION Left 2008  . RIGHT/LEFT HEART CATH AND CORONARY ANGIOGRAPHY N/A 04/21/2017   Procedure: RIGHT/LEFT HEART CATH AND CORONARY ANGIOGRAPHY;  Surgeon: Wellington Hampshire, MD;  Location: [facility];  Service: Cardiovascular;  Laterality: N/A;   Family History  Problem Relation Age of Onset  . Hypertension Mother   . Congestive Heart Failure Mother   . Congestive Heart Failure Father    Social History   Tobacco Use  . Smoking status: Former Smoker    Years: 10.00    Types: Cigarettes    Start date: 09/14/1975    Last attempt to quit: 06/22/1986    Years since quitting: 31.6  . Smokeless tobacco: Never Used  Substance Use Topics  . Alcohol use: No    Alcohol/week: 0.0 oz   No Known Allergies  Prior to Admission medications   Medication Sig Start Date End Date Taking? Authorizing Provider  acetaminophen (TYLENOL) 500 MG chewable tablet Chew 500 mg by mouth every 6 (six) hours as needed for pain.   Yes [provider]  aspirin EC 81  MG tablet Take 81 mg by mouth daily.   Yes [provider]  atorvastatin (LIPITOR) 40 MG tablet Take 1 tablet (40 mg total) by mouth daily. 03/15/17  Yes Sowles, Drue Stager, MD  carvedilol (COREG) 6.25 MG tablet Take 1 tablet (6.25 mg total) by mouth 2 (two) times daily with a meal. 07/13/17  Yes Hackney, Tina A, FNP  digoxin (DIGOX) 0.125 MG tablet Take 1 tablet (125 mcg total) by mouth daily. 09/28/17  Yes Darylene Price A, FNP  furosemide (LASIX) 40 MG tablet take 1 tablet by mouth twice a day 09/30/17  Yes Hackney, Otila Kluver A, FNP  potassium chloride SA (K-DUR,KLOR-CON) 20 MEQ tablet Take 1 tablet (20 mEq total)  by mouth daily. 09/28/17  Yes Hackney, Tina A, FNP  sacubitril-valsartan (ENTRESTO) 49-51 MG Take 1 tablet by mouth 2 (two) times daily. 07/13/17  Yes Hackney, Otila Kluver A, FNP  spironolactone (ALDACTONE) 25 MG tablet Take 1 tablet (25 mg total) by mouth daily. 09/28/17  Yes Alisa Graff, FNP    Review of Systems  Constitutional: Positive for fatigue. Negative for appetite change.  HENT: Negative for congestion and postnasal drip.   Eyes: Negative.   Respiratory: Positive for shortness of breath. Negative for chest tightness.   Cardiovascular: Negative for chest pain, palpitations and leg swelling.  Gastrointestinal: Negative for abdominal distention and abdominal pain.  Endocrine: Negative.   Genitourinary: Negative.   Musculoskeletal: Positive for arthralgias (right lower leg pain (arthritis)). Negative for back pain.  Skin: Negative.   Allergic/Immunologic: Negative.   Neurological: Positive for dizziness (minimal). Negative for light-headedness.  Hematological: Negative for adenopathy. Does not bruise/bleed easily.  Psychiatric/Behavioral: Negative for dysphoric mood and sleep disturbance (sleeping on 1-2 pillows). The patient is not nervous/anxious.    Vitals:   02/21/18 1149  BP: 130/68  Pulse: 67  Resp: 18  SpO2: 100%  Weight: 251 lb 8 oz (114.1 kg)  Height: 5\' 6"  (1.676 m)   Wt Readings from Last 3 Encounters:  02/21/18 251 lb 8 oz (114.1 kg)  02/08/18 250 lb (113.4 kg)  09/28/17 251 lb 2 oz (113.9 kg)   Lab Results  Component Value Date   CREATININE 0.82 02/08/2018   CREATININE 0.97 07/11/2017   CREATININE 0.87 06/09/2017   Physical Exam  Constitutional: She is oriented to person, place, and time. She appears well-developed and well-nourished.  HENT:  Head: Normocephalic and atraumatic.  Neck: Normal range of motion. Neck supple. No JVD present.  Cardiovascular: Normal rate and regular rhythm.  Pulmonary/Chest: Effort normal. She has no wheezes. She has no rales.   Abdominal: Soft. She exhibits no distension. There is no tenderness.  Musculoskeletal: She exhibits no edema or tenderness.  Neurological: She is alert and oriented to person, place, and time.  Skin: Skin is warm and dry.  Psychiatric: She has a normal mood and affect. Her behavior is normal. Thought content normal.  Nursing note and vitals reviewed.  Assessment & Plan:  1: Chronic heart failure with reduced ejection fraction- - NYHA class II - Euvolemic today - continues to weigh daily. Reminded to call for an overnight weight gain of >2 pounds or a weekly weight gain of >5 pounds - weight stable from last time she was here - Not adding salt and has been reading food labels; using no sodium seasoning such as Mrs. Dash.  - Does wear support socks & elevates her legs; edema is intermittent - dig level on 07/11/17 was 0.7 - saw cardiology Fletcher Anon) 08/29/17  -  due to BMI, does not meet ReDS vest criteria - will increase entresto to 97/103mg  twice daily. Patient is to finish out what she has by taking 2 tablets of the 49/51mg  twice daily - will check a BMP at her next visit - BNP 04/19/17 was 1000.0  2: HTN- - BP good today   - saw PCP Ancil Boozer) 05/09/17 - BMP from 02/08/18 reviewed and showed sodium 139, potassium 3.7 and GFR >60  Patient did not bring her medications nor a list. Each medication was verbally reviewed with the patient and she was encouraged to bring the bottles to every visit to confirm accuracy of list.  Return in 1 month or sooner for any questions/problems before then.

## 2018-03-15 ENCOUNTER — Other Ambulatory Visit: Payer: Self-pay

## 2018-03-15 MED ORDER — DIGOXIN 125 MCG PO TABS: 125.0000 ug | ORAL_TABLET | Freq: Every day | ORAL | 3 refills | Status: DC

## 2018-03-27 ENCOUNTER — Encounter: Payer: Self-pay | Admitting: Family

## 2018-03-27 ENCOUNTER — Ambulatory Visit: Payer: Medicare Other | Attending: Family | Admitting: Family

## 2018-03-27 VITALS — BP 124/50 | HR 60 | Resp 18 | Ht 66.0 in | Wt 252.1 lb

## 2018-03-27 DIAGNOSIS — Z90710 Acquired absence of both cervix and uterus: Secondary | ICD-10-CM | POA: Diagnosis not present

## 2018-03-27 DIAGNOSIS — M79661 Pain in right lower leg: Secondary | ICD-10-CM | POA: Insufficient documentation

## 2018-03-27 DIAGNOSIS — E785 Hyperlipidemia, unspecified: Secondary | ICD-10-CM | POA: Diagnosis not present

## 2018-03-27 DIAGNOSIS — I10 Essential (primary) hypertension: Secondary | ICD-10-CM

## 2018-03-27 DIAGNOSIS — I2510 Atherosclerotic heart disease of native coronary artery without angina pectoris: Secondary | ICD-10-CM | POA: Insufficient documentation

## 2018-03-27 DIAGNOSIS — Z7982 Long term (current) use of aspirin: Secondary | ICD-10-CM | POA: Insufficient documentation

## 2018-03-27 DIAGNOSIS — Z6841 Body Mass Index (BMI) 40.0 and over, adult: Secondary | ICD-10-CM | POA: Diagnosis not present

## 2018-03-27 DIAGNOSIS — K766 Portal hypertension: Secondary | ICD-10-CM | POA: Insufficient documentation

## 2018-03-27 DIAGNOSIS — Z87891 Personal history of nicotine dependence: Secondary | ICD-10-CM | POA: Diagnosis not present

## 2018-03-27 DIAGNOSIS — J449 Chronic obstructive pulmonary disease, unspecified: Secondary | ICD-10-CM | POA: Insufficient documentation

## 2018-03-27 DIAGNOSIS — Z79899 Other long term (current) drug therapy: Secondary | ICD-10-CM | POA: Insufficient documentation

## 2018-03-27 DIAGNOSIS — Z9889 Other specified postprocedural states: Secondary | ICD-10-CM | POA: Insufficient documentation

## 2018-03-27 DIAGNOSIS — Z8541 Personal history of malignant neoplasm of cervix uteri: Secondary | ICD-10-CM | POA: Diagnosis not present

## 2018-03-27 DIAGNOSIS — Z8249 Family history of ischemic heart disease and other diseases of the circulatory system: Secondary | ICD-10-CM | POA: Diagnosis not present

## 2018-03-27 DIAGNOSIS — I110 Hypertensive heart disease with heart failure: Secondary | ICD-10-CM | POA: Diagnosis not present

## 2018-03-27 DIAGNOSIS — I5022 Chronic systolic (congestive) heart failure: Secondary | ICD-10-CM | POA: Diagnosis not present

## 2018-03-27 DIAGNOSIS — I2721 Secondary pulmonary arterial hypertension: Secondary | ICD-10-CM | POA: Diagnosis not present

## 2018-03-27 LAB — BASIC METABOLIC PANEL
Anion gap: 10 (ref 5–15)
BUN: 18 mg/dL (ref 8–23)
CO2: 26 mmol/L (ref 22–32)
Calcium: 9.7 mg/dL (ref 8.9–10.3)
Chloride: 106 mmol/L (ref 98–111)
Creatinine, Ser: 0.88 mg/dL (ref 0.44–1.00)
GFR calc Af Amer: >60 mL/min (ref >60)
GLUCOSE: 98 mg/dL (ref 70–99)
POTASSIUM: 4.1 mmol/L (ref 3.5–5.1)
Sodium: 142 mmol/L (ref 135–145)

## 2018-03-27 NOTE — Patient Instructions (Signed)
Continue weighing daily and call for an overnight weight gain of > 2 pounds or a weekly weight gain of >5 pounds. 

## 2018-03-27 NOTE — Progress Notes (Signed)
Patient ID: SHANDRA SZYMBORSKI, female    DOB: 17-Oct-1940, 77 y.o.   MRN: [id number]  HPI  Ms Hynson is a 77 y/o female with a history of AV block, COPD, hyperlipidemia, HTN, osteoarthritis, remote tobacco use and chronic heart failure.  Echo report from 08/26/17 reviewed and showed an EF of 30-35% along with moderate MR and normal PA pressure. Echo from 04/19/17 reviewed and shows an EF of 30-35% along with mild MR and moderate TR. Right/Left Cardiac catheterization was done 04/21/17 and showed mild nonobstructive CAD with an EF of 25-35%. Moderately elevated filling pressure, severe pulmonary HTN and severely reduced cardiac output at 3.14 with a cardiac index of 1.38. PA pressure was 61/34 with a mean of 47 mm Hg.   Was in the ED 02/08/18 due to HTN where she was evaluated and released.   She presents today for a follow up visit with a chief complaint of minimal shortness of breath upon moderate exertion. She describes this as chronic in nature having been present for several months. She has associated fatigue and right lower leg pain along with this. She denies any difficulty sleeping, abdominal distention, palpitations, pedal edema, chest pain, dizziness or weight gain.   Past Medical History:  Diagnosis Date  . Acid phosphatase elevated   . AV block, 1st degree   . CHF (congestive heart failure) ([place])   . COPD (chronic obstructive pulmonary disease) ([place])   . HFrEF (heart failure with reduced ejection fraction) ([place])    a. 04/2017 Echo: EF 30-35%, antsept HK, mild MR, mod dil LA, mildly dil RA, mod TR, mildly to mod elev PASP.  Marland Kitchen Hx of cervical malignancy   . Hyperlipidemia   . Hypertension   . LBBB (left bundle branch block)   . Metabolic syndrome   . NICM (nonischemic cardiomyopathy) ([place])    a. 04/2017 Echo: EF 30-35%; b. 04/2017 Cath: nonobs dzs.  . Non-obstructive CAD (coronary artery disease)    a. 04/2017 Cath: mild nonobs CAD. EF 25-35%. CO 3.14, CI 1.38. Sev PAH [61/34(47].  .  Obesity, Class III, BMI 40-49.9 (morbid obesity) ([place])   . Osteoarthritis of both knees   . PAH (pulmonary arterial hypertension) with portal hypertension ([place])    a. 04/2017 Right Heart Cath: Sev PAH 61/34(47).   Past Surgical History:  Procedure Laterality Date  . ABDOMINAL HYSTERECTOMY  1996  . CATARACT EXTRACTION Left 2008  . RIGHT/LEFT HEART CATH AND CORONARY ANGIOGRAPHY N/A 04/21/2017   Procedure: RIGHT/LEFT HEART CATH AND CORONARY ANGIOGRAPHY;  Surgeon: Wellington Hampshire, MD;  Location: [facility];  Service: Cardiovascular;  Laterality: N/A;   Family History  Problem Relation Age of Onset  . Hypertension Mother   . Congestive Heart Failure Mother   . Congestive Heart Failure Father    Social History   Tobacco Use  . Smoking status: Former Smoker    Years: 10.00    Types: Cigarettes    Start date: 09/14/1975    Last attempt to quit: 06/22/1986    Years since quitting: 31.7  . Smokeless tobacco: Never Used  Substance Use Topics  . Alcohol use: No    Alcohol/week: 0.0 oz   No Known Allergies  Prior to Admission medications   Medication Sig Start Date End Date Taking? Authorizing Provider  acetaminophen (TYLENOL) 500 MG chewable tablet Chew 500 mg by mouth every 6 (six) hours as needed for pain.   Yes [provider]  aspirin EC 81 MG tablet Take  81 mg by mouth daily.   Yes [provider]  atorvastatin (LIPITOR) 40 MG tablet Take 1 tablet (40 mg total) by mouth daily. 02/21/18  Yes Alisa Graff, FNP  carvedilol (COREG) 6.25 MG tablet Take 1 tablet (6.25 mg total) by mouth 2 (two) times daily with a meal. 07/13/17  Yes Hackney, Tina A, FNP  digoxin (DIGOX) 0.125 MG tablet Take 1 tablet (125 mcg total) by mouth daily. 03/15/18  Yes Hackney, Otila Kluver A, FNP  furosemide (LASIX) 40 MG tablet Take 1 tablet (40 mg total) by mouth daily. 02/21/18  Yes Hackney, Otila Kluver A, FNP  potassium chloride SA (K-DUR,KLOR-CON) 20 MEQ tablet Take 1 tablet (20 mEq total) by  mouth daily. 09/28/17  Yes Hackney, Tina A, FNP  sacubitril-valsartan (ENTRESTO) 49-51 MG Take 1 tablet by mouth 2 (two) times daily. Patient taking differently: Take 2 tablets by mouth 2 (two) times daily.  07/13/17  Yes Hackney, Otila Kluver A, FNP  spironolactone (ALDACTONE) 25 MG tablet Take 1 tablet (25 mg total) by mouth daily. 09/28/17  Yes Alisa Graff, FNP    Review of Systems  Constitutional: Positive for fatigue. Negative for appetite change.  HENT: Negative for congestion and postnasal drip.   Eyes: Negative.   Respiratory: Positive for shortness of breath (with moderate exertion). Negative for chest tightness.   Cardiovascular: Negative for chest pain, palpitations and leg swelling.  Gastrointestinal: Negative for abdominal distention and abdominal pain.  Endocrine: Negative.   Genitourinary: Negative.   Musculoskeletal: Positive for arthralgias (right lower leg pain (arthritis)). Negative for back pain.  Skin: Negative.   Allergic/Immunologic: Negative.   Neurological: Negative for dizziness and light-headedness.  Hematological: Negative for adenopathy. Does not bruise/bleed easily.  Psychiatric/Behavioral: Negative for dysphoric mood and sleep disturbance (sleeping on 1-2 pillows). The patient is not nervous/anxious.    Vitals:   03/27/18 1000  BP: (!) 124/50  Pulse: 60  Resp: 18  SpO2: 100%  Weight: 252 lb 2 oz (114.4 kg)  Height: 5\' 6"  (1.676 m)   Wt Readings from Last 3 Encounters:  03/27/18 252 lb 2 oz (114.4 kg)  02/21/18 251 lb 8 oz (114.1 kg)  02/08/18 250 lb (113.4 kg)   Lab Results  Component Value Date   CREATININE 0.82 02/08/2018   CREATININE 0.97 07/11/2017   CREATININE 0.87 06/09/2017    Physical Exam  Constitutional: She is oriented to person, place, and time. She appears well-developed and well-nourished.  HENT:  Head: Normocephalic and atraumatic.  Neck: Normal range of motion. Neck supple. No JVD present.  Cardiovascular: Normal rate and  regular rhythm.  Pulmonary/Chest: Effort normal. She has no wheezes. She has no rales.  Abdominal: Soft. She exhibits no distension. There is no tenderness.  Musculoskeletal: She exhibits no edema or tenderness.  Neurological: She is alert and oriented to person, place, and time.  Skin: Skin is warm and dry.  Psychiatric: She has a normal mood and affect. Her behavior is normal. Thought content normal.  Nursing note and vitals reviewed.  Assessment & Plan:  1: Chronic heart failure with reduced ejection fraction- - NYHA class II - Euvolemic today - continues to weigh daily. Reminded to call for an overnight weight gain of >2 pounds or a weekly weight gain of >5 pounds - weight stable from last time she was here - Not adding salt and has been reading food labels; using no sodium seasoning such as Mrs. Dash.  - Does wear support socks & elevates her legs; edema  is intermittent - dig level on 07/11/17 was 0.7 - saw cardiology Fletcher Anon) 08/29/17  - tolerating increase in entresto without known side effects. She continues to take 2 of her 49/51mg  tablets as she says that she still has plenty left. She will call for new RX of 97/103mg  when she gets close to running out - will check a BMP today - BNP 04/19/17 was 1000.0  2: HTN- - BP good today   - saw PCP Ancil Boozer) 05/09/17 - BMP from 02/08/18 reviewed and showed sodium 139, potassium 3.7 and GFR >60  Patient did not bring her medications nor a list. Each medication was verbally reviewed with the patient and she was encouraged to bring the bottles to every visit to confirm accuracy of list.  Return in 6 months or sooner for any questions/problems before then.

## 2018-04-18 ENCOUNTER — Other Ambulatory Visit: Payer: Self-pay | Admitting: Family

## 2018-04-18 MED ORDER — SACUBITRIL-VALSARTAN 97-103 MG PO TABS: 1.0000 | ORAL_TABLET | Freq: Two times a day (BID) | ORAL | 3 refills | Status: DC

## 2018-05-30 ENCOUNTER — Other Ambulatory Visit: Payer: Self-pay | Admitting: Family Medicine

## 2018-05-30 DIAGNOSIS — J302 Other seasonal allergic rhinitis: Secondary | ICD-10-CM

## 2018-05-30 DIAGNOSIS — J3089 Other allergic rhinitis: Principal | ICD-10-CM

## 2018-05-30 NOTE — Telephone Encounter (Signed)
Not seen in one year, needs follow up

## 2018-05-31 NOTE — Telephone Encounter (Signed)
Pt states she does not need these to be filled. She did schedule an appt for a med refill FU

## 2018-07-18 ENCOUNTER — Other Ambulatory Visit: Payer: Self-pay | Admitting: Family

## 2018-07-18 ENCOUNTER — Encounter: Payer: Self-pay | Admitting: Family Medicine

## 2018-07-18 ENCOUNTER — Ambulatory Visit (INDEPENDENT_AMBULATORY_CARE_PROVIDER_SITE_OTHER): Payer: Medicare Other | Admitting: Family Medicine

## 2018-07-18 VITALS — BP 138/62 | HR 77 | Temp 97.5°F | Resp 16 | Ht 66.0 in | Wt 256.7 lb

## 2018-07-18 DIAGNOSIS — R7989 Other specified abnormal findings of blood chemistry: Secondary | ICD-10-CM

## 2018-07-18 DIAGNOSIS — Z23 Encounter for immunization: Secondary | ICD-10-CM

## 2018-07-18 DIAGNOSIS — E785 Hyperlipidemia, unspecified: Secondary | ICD-10-CM

## 2018-07-18 DIAGNOSIS — I472 Ventricular tachycardia, unspecified: Secondary | ICD-10-CM | POA: Insufficient documentation

## 2018-07-18 DIAGNOSIS — J302 Other seasonal allergic rhinitis: Secondary | ICD-10-CM

## 2018-07-18 DIAGNOSIS — M1712 Unilateral primary osteoarthritis, left knee: Secondary | ICD-10-CM | POA: Diagnosis not present

## 2018-07-18 DIAGNOSIS — D649 Anemia, unspecified: Secondary | ICD-10-CM | POA: Diagnosis not present

## 2018-07-18 DIAGNOSIS — I10 Essential (primary) hypertension: Secondary | ICD-10-CM | POA: Diagnosis not present

## 2018-07-18 DIAGNOSIS — Z6841 Body Mass Index (BMI) 40.0 and over, adult: Secondary | ICD-10-CM

## 2018-07-18 DIAGNOSIS — I5022 Chronic systolic (congestive) heart failure: Secondary | ICD-10-CM | POA: Diagnosis not present

## 2018-07-18 DIAGNOSIS — M1711 Unilateral primary osteoarthritis, right knee: Secondary | ICD-10-CM | POA: Diagnosis not present

## 2018-07-18 DIAGNOSIS — Z79899 Other long term (current) drug therapy: Secondary | ICD-10-CM | POA: Diagnosis not present

## 2018-07-18 DIAGNOSIS — I700 Atherosclerosis of aorta: Secondary | ICD-10-CM | POA: Diagnosis not present

## 2018-07-18 DIAGNOSIS — M170 Bilateral primary osteoarthritis of knee: Secondary | ICD-10-CM

## 2018-07-18 DIAGNOSIS — J3089 Other allergic rhinitis: Secondary | ICD-10-CM

## 2018-07-18 NOTE — Progress Notes (Signed)
Name: Kimberly Henry   MRN: [id number]    DOB: 05/09/1941   Date:07/18/2018       Progress Note  Subjective  Chief Complaint  Chief Complaint  Patient presents with  . Medication Refill  . Leg Pain    patient presents today with right leg pain for the past 2-3 weeks.   . Immunizations    high dose flu    HPI   CHF: diagnosed in 04/2017, currently under the care of cardiologist. She is on beta-blocker, digoxin, spironolactone, lasix and entresto. She denies side effects of medication. She denies any current swelling, orthopnea, but has sob with moderate activity. She is due for labs  Elevated TSH: found on labs back in 04/2017, we will recheck today, she denies fatigue, hair loss or dry skin  HTN: she has been taking medication daily, no chest pain or palpitation  SVT: she is doing well, no palpitation or fluttering sensation on her chest, doing well since started on CHF medication  OA: she has a long history of OA of both knees. Pain has been getting progressively worse, she would like to see Ortho, causing her to limp. Pain is daily, constant and worse with activity. Today pain is 2/10 sitting, but more intense when standing.  Perennial allergic rhinitis: she has not been taking medication, but denies sneezing, cough or rhinorrhea.   Obesity: she has gained some weight since last visit with cardiologist, she has been unable to exercise because of knee pain, she states she will try to cut down on portion size   Dyslipidemia: she is now on Atorvastatin and is due for labs, denies myalgia.   Patient Active Problem List   Diagnosis Date Noted  . Chronic systolic heart failure ([place]) 04/28/2017  . Hypokalemia 04/19/2017  . Elevated troponin 04/19/2017  . Hyperglycemia 04/19/2017  . Aortic atherosclerosis ([place]) 03/10/2017  . Anemia, unspecified 03/08/2017  . Cardiomegaly 03/04/2017  . Abnormal CXR 03/04/2017  . Perennial allergic rhinitis with seasonal variation 05/30/2015   . 1st degree AV block 03/25/2015  . Metabolic syndrome 64/40/3474  . History of cervical cancer 03/03/2015  . Hypertension, benign 03/03/2015  . Osteoarthritis, knee 03/03/2015  . Dyslipidemia 03/03/2015    Past Surgical History:  Procedure Laterality Date  . ABDOMINAL HYSTERECTOMY  1996  . CATARACT EXTRACTION Left 2008  . RIGHT/LEFT HEART CATH AND CORONARY ANGIOGRAPHY N/A 04/21/2017   Procedure: RIGHT/LEFT HEART CATH AND CORONARY ANGIOGRAPHY;  Surgeon: Wellington Hampshire, MD;  Location: [facility];  Service: Cardiovascular;  Laterality: N/A;    Family History  Problem Relation Age of Onset  . Hypertension Mother   . Congestive Heart Failure Mother   . Congestive Heart Failure Father   . Prostate cancer Son     Social History   Socioeconomic History  . Marital status: Widowed    Spouse name: Not on file  . Number of children: 4  . Years of education: 60  . Highest education level: 12th grade  Occupational History  . Not on file  Social Needs  . Financial resource strain: Not hard at all  . Food insecurity:    Worry: Never true    Inability: Never true  . Transportation needs:    Medical: No    Non-medical: No  Tobacco Use  . Smoking status: Former Smoker    Years: 10.00    Types: Cigarettes    Start date: 09/14/1975    Last attempt to quit: 06/22/1986  Years since quitting: 32.0  . Smokeless tobacco: Never Used  Substance and Sexual Activity  . Alcohol use: No    Alcohol/week: 0.0 standard drinks  . Drug use: No  . Sexual activity: Never  Lifestyle  . Physical activity:    Days per week: 0 days    Minutes per session: 0 min  . Stress: Not at all  Relationships  . Social connections:    Talks on phone: More than three times a week    Gets together: Three times a week    Attends religious service: More than 4 times per year    Active member of club or organization: No    Attends meetings of clubs or organizations: Never    Relationship status:  Widowed  . Intimate partner violence:    Fear of current or ex partner: Patient refused    Emotionally abused: Patient refused    Physically abused: Patient refused    Forced sexual activity: Patient refused  Other Topics Concern  . Not on file  Social History Narrative   Patient has 4 grown children, 14 grandchildren & 5 great-grandchildren     Current Outpatient Medications:  .  acetaminophen (TYLENOL) 500 MG chewable tablet, Chew 500 mg by mouth every 6 (six) hours as needed for pain., Disp: , Rfl:  .  aspirin EC 81 MG tablet, Take 81 mg by mouth daily., Disp: , Rfl:  .  atorvastatin (LIPITOR) 40 MG tablet, Take 1 tablet (40 mg total) by mouth daily., Disp: 90 tablet, Rfl: 3 .  carvedilol (COREG) 6.25 MG tablet, Take 1 tablet (6.25 mg total) by mouth 2 (two) times daily with a meal., Disp: 180 tablet, Rfl: 3 .  digoxin (DIGOX) 0.125 MG tablet, Take 1 tablet (125 mcg total) by mouth daily., Disp: 90 tablet, Rfl: 3 .  digoxin (LANOXIN) 0.125 MG tablet, TAKE 1 TABLET BY MOUTH  DAILY, Disp: 90 tablet, Rfl: 3 .  furosemide (LASIX) 40 MG tablet, Take 1 tablet (40 mg total) by mouth daily., Disp: 90 tablet, Rfl: 3 .  potassium chloride SA (K-DUR,KLOR-CON) 20 MEQ tablet, Take 1 tablet (20 mEq total) by mouth daily., Disp: 90 tablet, Rfl: 3 .  sacubitril-valsartan (ENTRESTO) 97-103 MG, Take 1 tablet by mouth 2 (two) times daily., Disp: 180 tablet, Rfl: 3 .  spironolactone (ALDACTONE) 25 MG tablet, Take 1 tablet (25 mg total) by mouth daily., Disp: 90 tablet, Rfl: 3  No Known Allergies  I personally reviewed active problem list, medication list, allergies, family history, social history with the patient/caregiver today.   ROS  Constitutional: Negative for fever , positive for mild  weight change.  Respiratory: Negative for cough , positive for shortness of breath with activity  Cardiovascular: Negative for chest pain or palpitations.  Gastrointestinal: Negative for abdominal pain, no  bowel changes.  Musculoskeletal: positive  for gait problem and bilateral knee  joint swelling.  Skin: Negative for rash.  Neurological: Negative for dizziness or headache.  No other specific complaints in a complete review of systems (except as listed in HPI above).   Objective  Vitals:   07/18/18 1004  BP: 138/62  Pulse: 77  Resp: 16  Temp: (!) 97.5 F (36.4 C)  TempSrc: Oral  SpO2: 99%  Weight: 256 lb 11.2 oz (116.4 kg)  Height: 5\' 6"  (1.676 m)    Body mass index is 41.43 kg/m.  Physical Exam  Constitutional: Patient appears well-developed and well-nourished. Obese  No distress.  HEENT: head atraumatic, normocephalic,  pupils equal and reactive to light, neck supple, throat within normal limits Cardiovascular: Normal rate, regular rhythm and normal heart sounds.  No murmur heard. No BLE edema. Pulmonary/Chest: Effort normal and breath sounds normal. No respiratory distress. Muscular skeletal: she has antalgic gait, brace on right knee, daily pain, crepitus with extension of both knee  Abdominal: Soft.  There is no tenderness. Psychiatric: Patient has a normal mood and affect. behavior is normal. Judgment and thought content normal.  PHQ2/9: Depression screen [facility] 2/9 07/18/2018 03/27/2018 02/21/2018 09/28/2017 07/11/2017  Decreased Interest 0 0 0 0 0  Down, Depressed, Hopeless 0 0 0 0 0  PHQ - 2 Score 0 0 0 0 0  Altered sleeping 0 - - - -  Tired, decreased energy 0 - - - -  Change in appetite 0 - - - -  Feeling bad or failure about yourself  0 - - - -  Trouble concentrating 0 - - - -  Moving slowly or fidgety/restless 0 - - - -  Suicidal thoughts 0 - - - -  PHQ-9 Score 0 - - - -  Difficult doing work/chores Not difficult at all - - - -     Fall Risk: Fall Risk  07/18/2018 03/27/2018 02/21/2018 09/28/2017 07/11/2017  Falls in the past year? 0 No No No No  Number falls in past yr: - - - - -  Injury with Fall? - - - - -     Functional Status Survey: Is the patient  deaf or have difficulty hearing?: No Does the patient have difficulty seeing, even when wearing glasses/contacts?: No Does the patient have difficulty concentrating, remembering, or making decisions?: No Does the patient have difficulty walking or climbing stairs?: Yes(due to right leg pain) Does the patient have difficulty dressing or bathing?: No Does the patient have difficulty doing errands alone such as visiting a doctor's office or shopping?: No    Assessment & Plan  1. Chronic systolic heart failure [facility])  Seeing cardiologist   2. Need for influenza vaccination  - Flu vaccine HIGH DOSE PF  3. Morbid (severe) obesity due to excess calories [facility])  Discussed with the patient the risk posed by an increased BMI. Discussed importance of portion control, calorie counting and at least 150 minutes of physical activity weekly. Avoid sweet beverages and drink more water. Eat at least 6 servings of fruit and vegetables daily   4. Aortic atherosclerosis ([facility])  On high dose statin and aspirin   5. Ventricular tachycardia ([place])  Rate controlled now with coreg and digoxin   6. Dyslipidemia  - Lipid panel  7. Hypertension, benign  - COMPLETE METABOLIC PANEL WITH GFR  8. Perennial allergic rhinitis with seasonal variation  Under control at this time  9. Morbid obesity with BMI of 40.0-44.9, adult ([place])  See above  10. Elevated TSH  - TSH - back in 2018 we will recheck today   11. Anemia, unspecified type  - CBC with Differential/Platelet - Iron, TIBC and Ferritin Panel - Vitamin B12  12. Primary osteoarthritis of both knees  - Ambulatory referral to Orthopedic Surgery

## 2018-07-19 LAB — CBC WITH DIFFERENTIAL/PLATELET
BASOS ABS: 22 {cells}/uL (ref 0–200)
BASOS PCT: 0.5 %
Eosinophils Absolute: 62 cells/uL (ref 15–500)
Eosinophils Relative: 1.4 %
HEMATOCRIT: 33.5 % — AB (ref 35.0–45.0)
HEMOGLOBIN: 11.4 g/dL — AB (ref 11.7–15.5)
LYMPHS ABS: 1593 {cells}/uL (ref 850–3900)
MCH: 31.1 pg (ref 27.0–33.0)
MCHC: 34 g/dL (ref 32.0–36.0)
MCV: 91.5 fL (ref 80.0–100.0)
MPV: 9.9 fL (ref 7.5–12.5)
Monocytes Relative: 8.6 %
NEUTROS PCT: 53.3 %
Neutro Abs: 2345 cells/uL (ref 1500–7800)
Platelets: 293 10*3/uL (ref 140–400)
RBC: 3.66 10*6/uL — ABNORMAL LOW (ref 3.80–5.10)
RDW: 11.6 % (ref 11.0–15.0)
TOTAL LYMPHOCYTE: 36.2 %
WBC: 4.4 10*3/uL (ref 3.8–10.8)
WBCMIX: 378 {cells}/uL (ref 200–950)

## 2018-07-19 LAB — COMPLETE METABOLIC PANEL WITH GFR
AG RATIO: 1.4 (calc) (ref 1.0–2.5)
ALBUMIN MSPROF: 4.3 g/dL (ref 3.6–5.1)
ALKALINE PHOSPHATASE (APISO): 117 U/L (ref 33–130)
ALT: 12 U/L (ref 6–29)
AST: 14 U/L (ref 10–35)
BUN: 16 mg/dL (ref 7–25)
CO2: 26 mmol/L (ref 20–32)
CREATININE: 0.83 mg/dL (ref 0.60–0.93)
Calcium: 9.9 mg/dL (ref 8.6–10.4)
Chloride: 107 mmol/L (ref 98–110)
GFR, EST NON AFRICAN AMERICAN: 68 mL/min/{1.73_m2} (ref >=60)
GFR, Est African American: 79 mL/min/{1.73_m2} (ref >=60)
GLOBULIN: 3 g/dL (ref 1.9–3.7)
Glucose, Bld: 88 mg/dL (ref 65–99)
POTASSIUM: 4.6 mmol/L (ref 3.5–5.3)
SODIUM: 140 mmol/L (ref 135–146)
Total Bilirubin: 0.5 mg/dL (ref 0.2–1.2)
Total Protein: 7.3 g/dL (ref 6.1–8.1)

## 2018-07-19 LAB — LIPID PANEL
CHOL/HDL RATIO: 3.3 (calc) (ref <5.0)
Cholesterol: 134 mg/dL (ref <200)
HDL: 41 mg/dL — AB (ref >50)
LDL CHOLESTEROL (CALC): 75 mg/dL
Non-HDL Cholesterol (Calc): 93 mg/dL (calc) (ref <130)
Triglycerides: 93 mg/dL (ref <150)

## 2018-07-19 LAB — TSH: TSH: 3.41 m[IU]/L (ref 0.40–4.50)

## 2018-07-19 LAB — IRON,TIBC AND FERRITIN PANEL
%SAT: 14 % — AB (ref 16–45)
FERRITIN: 326 ng/mL — AB (ref 16–288)
Iron: 45 ug/dL (ref 45–160)
TIBC: 323 mcg/dL (calc) (ref 250–450)

## 2018-07-19 LAB — VITAMIN B12: Vitamin B-12: 470 pg/mL (ref 200–1100)

## 2018-07-20 ENCOUNTER — Other Ambulatory Visit: Payer: Self-pay | Admitting: Family

## 2018-09-27 ENCOUNTER — Encounter: Payer: Self-pay | Admitting: Pharmacist

## 2018-09-27 ENCOUNTER — Ambulatory Visit: Payer: Medicare Other | Attending: Family | Admitting: Family

## 2018-09-27 ENCOUNTER — Encounter: Payer: Self-pay | Admitting: Family

## 2018-09-27 VITALS — BP 143/53 | HR 62 | Resp 18 | Ht 66.0 in | Wt 258.0 lb

## 2018-09-27 DIAGNOSIS — I428 Other cardiomyopathies: Secondary | ICD-10-CM | POA: Insufficient documentation

## 2018-09-27 DIAGNOSIS — I447 Left bundle-branch block, unspecified: Secondary | ICD-10-CM | POA: Insufficient documentation

## 2018-09-27 DIAGNOSIS — E785 Hyperlipidemia, unspecified: Secondary | ICD-10-CM | POA: Diagnosis not present

## 2018-09-27 DIAGNOSIS — Z8541 Personal history of malignant neoplasm of cervix uteri: Secondary | ICD-10-CM | POA: Diagnosis not present

## 2018-09-27 DIAGNOSIS — I10 Essential (primary) hypertension: Secondary | ICD-10-CM

## 2018-09-27 DIAGNOSIS — Z87891 Personal history of nicotine dependence: Secondary | ICD-10-CM | POA: Insufficient documentation

## 2018-09-27 DIAGNOSIS — I5022 Chronic systolic (congestive) heart failure: Secondary | ICD-10-CM | POA: Insufficient documentation

## 2018-09-27 DIAGNOSIS — Z7982 Long term (current) use of aspirin: Secondary | ICD-10-CM | POA: Insufficient documentation

## 2018-09-27 DIAGNOSIS — I2721 Secondary pulmonary arterial hypertension: Secondary | ICD-10-CM | POA: Diagnosis not present

## 2018-09-27 DIAGNOSIS — I110 Hypertensive heart disease with heart failure: Secondary | ICD-10-CM | POA: Diagnosis not present

## 2018-09-27 DIAGNOSIS — I2510 Atherosclerotic heart disease of native coronary artery without angina pectoris: Secondary | ICD-10-CM | POA: Insufficient documentation

## 2018-09-27 DIAGNOSIS — J449 Chronic obstructive pulmonary disease, unspecified: Secondary | ICD-10-CM | POA: Diagnosis not present

## 2018-09-27 DIAGNOSIS — Z8249 Family history of ischemic heart disease and other diseases of the circulatory system: Secondary | ICD-10-CM | POA: Diagnosis not present

## 2018-09-27 DIAGNOSIS — Z79899 Other long term (current) drug therapy: Secondary | ICD-10-CM | POA: Diagnosis not present

## 2018-09-27 NOTE — Progress Notes (Signed)
Patient ID: Kimberly Henry, female    DOB: 13-May-1941, 78 y.o.   MRN: [id number]  HPI  Kimberly Henry is a 78 y/o female with a history of AV block, COPD, hyperlipidemia, HTN, osteoarthritis, remote tobacco use and chronic heart failure.  Echo report from 08/26/17 reviewed and showed an EF of 30-35% along with moderate MR and normal PA pressure. Echo from 04/19/17 reviewed and shows an EF of 30-35% along with mild MR and moderate TR. Right/Left Cardiac catheterization was done 04/21/17 and showed mild nonobstructive CAD with an EF of 25-35%. Moderately elevated filling pressure, severe pulmonary HTN and severely reduced cardiac output at 3.14 with a cardiac index of 1.38. PA pressure was 61/34 with a mean of 47 mm Hg.   Was in the ED 02/08/18 due to HTN where she was evaluated and released.   She presents today for a follow up visit with a chief complaint of minimal shortness of breath upon moderate exertion. She has associated fatigue and slight weight gain along with this. She denies any chest pain, pedal edema, palpitations, abdominal distention, dizziness or difficulty sleeping. She has been having right knee pain and says that she may need knee surgery in the future. Has not been weighing herself daily.   Past Medical History:  Diagnosis Date  . Acid phosphatase elevated   . AV block, 1st degree   . CHF (congestive heart failure) ([place])   . COPD (chronic obstructive pulmonary disease) ([place])   . HFrEF (heart failure with reduced ejection fraction) ([place])    a. 04/2017 Echo: EF 30-35%, antsept HK, mild MR, mod dil LA, mildly dil RA, mod TR, mildly to mod elev PASP.  Marland Kitchen Hx of cervical malignancy   . Hyperlipidemia   . Hypertension   . LBBB (left bundle branch block)   . Metabolic syndrome   . NICM (nonischemic cardiomyopathy) ([place])    a. 04/2017 Echo: EF 30-35%; b. 04/2017 Cath: nonobs dzs.  . Non-obstructive CAD (coronary artery disease)    a. 04/2017 Cath: mild nonobs CAD. EF 25-35%. CO 3.14, CI  1.38. Sev PAH [61/34(47].  . Obesity, Class III, BMI 40-49.9 (morbid obesity) ([place])   . Osteoarthritis of both knees   . PAH (pulmonary arterial hypertension) with portal hypertension ([place])    a. 04/2017 Right Heart Cath: Sev PAH 61/34(47).   Past Surgical History:  Procedure Laterality Date  . ABDOMINAL HYSTERECTOMY  1996  . CATARACT EXTRACTION Left 2008  . RIGHT/LEFT HEART CATH AND CORONARY ANGIOGRAPHY N/A 04/21/2017   Procedure: RIGHT/LEFT HEART CATH AND CORONARY ANGIOGRAPHY;  Surgeon: Wellington Hampshire, MD;  Location: [facility];  Service: Cardiovascular;  Laterality: N/A;   Family History  Problem Relation Age of Onset  . Hypertension Mother   . Congestive Heart Failure Mother   . Congestive Heart Failure Father   . Prostate cancer Son    Social History   Tobacco Use  . Smoking status: Former Smoker    Years: 10.00    Types: Cigarettes    Start date: 09/14/1975    Last attempt to quit: 06/22/1986    Years since quitting: 32.2  . Smokeless tobacco: Never Used  Substance Use Topics  . Alcohol use: No    Alcohol/week: 0.0 standard drinks   No Known Allergies  Prior to Admission medications   Medication Sig Start Date End Date Taking? Authorizing Provider  acetaminophen (TYLENOL) 500 MG chewable tablet Chew 500 mg by mouth every 6 (six) hours as needed  for pain.   Yes [provider]  aspirin EC 81 MG tablet Take 81 mg by mouth daily.   Yes [provider]  atorvastatin (LIPITOR) 40 MG tablet Take 1 tablet (40 mg total) by mouth daily. 02/21/18  Yes Darylene Price A, FNP  carvedilol (COREG) 6.25 MG tablet TAKE 1 TABLET BY MOUTH TWO  TIMES DAILY WITH MEALS 07/20/18  Yes Darylene Price A, FNP  digoxin (LANOXIN) 0.125 MG tablet TAKE 1 TABLET BY MOUTH  DAILY 07/18/18  Yes Darylene Price A, FNP  furosemide (LASIX) 40 MG tablet Take 1 tablet (40 mg total) by mouth daily. 02/21/18  Yes , Otila Kluver A, FNP  potassium chloride SA (K-DUR,KLOR-CON) 20 MEQ tablet  Take 1 tablet (20 mEq total) by mouth daily. 09/28/17  Yes ,  A, FNP  sacubitril-valsartan (ENTRESTO) 97-103 MG Take 1 tablet by mouth 2 (two) times daily. 04/18/18  Yes Darylene Price A, FNP  spironolactone (ALDACTONE) 25 MG tablet TAKE 1 TABLET BY MOUTH  DAILY 07/20/18   Alisa Graff, FNP    Review of Systems  Constitutional: Positive for fatigue. Negative for appetite change.  HENT: Negative for congestion and postnasal drip.   Eyes: Negative.   Respiratory: Positive for shortness of breath (with moderate exertion). Negative for chest tightness.   Cardiovascular: Negative for chest pain, palpitations and leg swelling.  Gastrointestinal: Negative for abdominal distention and abdominal pain.  Endocrine: Negative.   Genitourinary: Negative.   Musculoskeletal: Positive for arthralgias (right knee pain). Negative for back pain.  Skin: Negative.   Allergic/Immunologic: Negative.   Neurological: Negative for dizziness and light-headedness.  Hematological: Negative for adenopathy. Does not bruise/bleed easily.  Psychiatric/Behavioral: Negative for dysphoric mood and sleep disturbance (sleeping on 1-2 pillows). The patient is not nervous/anxious.    Vitals:   09/27/18 0940  BP: (!) 143/53  Pulse: 62  Resp: 18  SpO2: 99%  Weight: 258 lb (117 kg)  Height: 5\' 6"  (1.676 m)   Wt Readings from Last 3 Encounters:  09/27/18 258 lb (117 kg)  07/18/18 256 lb 11.2 oz (116.4 kg)  03/27/18 252 lb 2 oz (114.4 kg)   Lab Results  Component Value Date   CREATININE 0.83 07/18/2018   CREATININE 0.88 03/27/2018   CREATININE 0.82 02/08/2018    Physical Exam  Constitutional: She is oriented to person, place, and time. She appears well-developed and well-nourished.  HENT:  Head: Normocephalic and atraumatic.  Neck: Normal range of motion. Neck supple. No JVD present.  Cardiovascular: Normal rate and regular rhythm.  Pulmonary/Chest: Effort normal. She has no wheezes. She has no rales.   Abdominal: Soft. She exhibits no distension. There is no abdominal tenderness.  Musculoskeletal:        General: No tenderness or edema.  Neurological: She is alert and oriented to person, place, and time.  Skin: Skin is warm and dry.  Psychiatric: She has a normal mood and affect. Her behavior is normal. Thought content normal.  Nursing note and vitals reviewed.  Assessment & Plan:  1: Chronic heart failure with reduced ejection fraction- - NYHA class II - Euvolemic today - has not been weighing daily; encouraged her to resume weighing daily and to call for an overnight weight gain of >2 pounds or a weekly weight gain of >5 pounds - weight up 5 pounds from last visit here 6 months ago - Not adding salt and has been reading food labels; using no sodium seasoning such as Mrs. Dash.  - Does wear  support socks & elevates her legs; edema is intermittent - dig level on 07/11/17 was 0.7 - saw cardiology Fletcher Anon) 08/29/17  - BNP 04/19/17 was 1000.0 - PharmD reconciled medications with the patient; patient is unsure if she's taking spironolactone and she will check medication list once she returns home  2: HTN- - BP looks good today - saw PCP Ancil Boozer) 07/18/18 - BMP from 07/18/18 reviewed and showed sodium 140, potassium 4.6, creatinine 0.83 and GFR 79  Patient did not bring her medications nor a list. Each medication was verbally reviewed with the patient and she was encouraged to bring the bottles to every visit to confirm accuracy of list.  Return in 6 months or sooner for any questions/problems before then.

## 2018-09-27 NOTE — Patient Instructions (Addendum)
Continue weighing daily and call for an overnight weight gain of > 2 pounds or a weekly weight gain of >5 pounds.  You have been seen by Dr. Fletcher Anon in the past and his office number is [phone]. You will need to call and make an appointment.

## 2018-09-27 NOTE — Progress Notes (Signed)
[facility] - HEART FAILURE CLINIC - PHARMACIST COUNSELING NOTE   ASSESSMENT   Mrs. Kimberly Henry presents to heart failure clinic for routine follow up. She reports no SOB or swelling - she is feeling generally well. She asks me if she will be on these medications for the rest of her life. I advised her that these are long term medications that people take to reduce symptoms and to help keep them out of the hospital.    Adherence assessment   Patient is using empty pill bottles as a modified pill box as an adherence strategy. She insists she takes seven pills but there are eight on her list. She did not bring in her medications today.   Do you ever forget to take your medication? [] Yes (1) [x] No (0)  Do you ever skip doses due to side effects? [] Yes (1) [x] No (0)  Do you have trouble affording your medicines? [] Yes (1) [x] No (0)  Are you ever unable to pick up your medication due to transportation difficulties? [] Yes (1) [x] No (0)  Do you ever stop taking your medications because you don't believe they are helping? [] Yes (1) [x] No (0)  Total score _0______       Guideline-Directed Medical Therapy/Evidence Based Medicine   ACE/ARB/ARNI: Yes   Beta Blocker: Yes   Aldosterone Antagonist: Yes Diuretic: Yes   Drug-related problem 1: Adherence. Patient is unsure if she takes spironolactone or not. That is the eighth medication on her list according to her memory but she reports only taking seven pills.    Drug-related problem 2: Dose too low. Carvedilol dose is not at goal. Today HR is 62 and BP 143/53  PLAN   DRP1: Patient was encouraged to bring her medications next time and to check when she gets home to make sure she is taking spironolactone as prescribed.   DRP2: HR is too low to advance beta-blocker dose. Continue to monitor.    SUBJECTIVE   HPI: Patient presents to [facility] for routine follow up and has no complaints. She says her weights at home have been  stable, she has no SOB or swelling.    Past Medical History:  Diagnosis Date  . Acid phosphatase elevated   . AV block, 1st degree   . CHF (congestive heart failure) ([place])   . COPD (chronic obstructive pulmonary disease) ([place])   . HFrEF (heart failure with reduced ejection fraction) ([place])    a. 04/2017 Echo: EF 30-35%, antsept HK, mild MR, mod dil LA, mildly dil RA, mod TR, mildly to mod elev PASP.  Marland Kitchen Hx of cervical malignancy   . Hyperlipidemia   . Hypertension   . LBBB (left bundle branch block)   . Metabolic syndrome   . NICM (nonischemic cardiomyopathy) ([place])    a. 04/2017 Echo: EF 30-35%; b. 04/2017 Cath: nonobs dzs.  . Non-obstructive CAD (coronary artery disease)    a. 04/2017 Cath: mild nonobs CAD. EF 25-35%. CO 3.14, CI 1.38. Sev PAH [61/34(47].  . Obesity, Class III, BMI 40-49.9 (morbid obesity) ([place])   . Osteoarthritis of both knees   . PAH (pulmonary arterial hypertension) with portal hypertension ([place])    a. 04/2017 Right Heart Cath: Sev PAH 61/34(47).      Current Outpatient Medications:  .  acetaminophen (TYLENOL) 500 MG chewable tablet, Chew 500 mg by mouth every 6 (six) hours as needed for pain., Disp: , Rfl:  .  aspirin EC 81 MG tablet, Take 81 mg by mouth daily., Disp: ,  Rfl:  .  atorvastatin (LIPITOR) 40 MG tablet, Take 1 tablet (40 mg total) by mouth daily., Disp: 90 tablet, Rfl: 3 .  carvedilol (COREG) 6.25 MG tablet, TAKE 1 TABLET BY MOUTH TWO  TIMES DAILY WITH MEALS, Disp: 180 tablet, Rfl: 3 .  digoxin (LANOXIN) 0.125 MG tablet, TAKE 1 TABLET BY MOUTH  DAILY, Disp: 90 tablet, Rfl: 3 .  furosemide (LASIX) 40 MG tablet, Take 1 tablet (40 mg total) by mouth daily., Disp: 90 tablet, Rfl: 3 .  potassium chloride SA (K-DUR,KLOR-CON) 20 MEQ tablet, Take 1 tablet (20 mEq total) by mouth daily., Disp: 90 tablet, Rfl: 3 .  sacubitril-valsartan (ENTRESTO) 97-103 MG, Take 1 tablet by mouth 2 (two) times daily., Disp: 180 tablet, Rfl: 3 .  spironolactone (ALDACTONE) 25 MG  tablet, TAKE 1 TABLET BY MOUTH  DAILY, Disp: 90 tablet, Rfl: 3    OBJECTIVE    BMP Latest Ref Rng & Units 07/18/2018 03/27/2018 02/08/2018  Glucose 65 - 99 mg/dL 88 98 135(H)  BUN 7 - 25 mg/dL 16 18 21(H)  Creatinine 0.60 - 0.93 mg/dL 0.83 0.88 0.82  BUN/Creat Ratio 6 - 22 (calc) NOT APPLICABLE - -  Sodium 163 - 146 mmol/L 140 142 139  Potassium 3.5 - 5.3 mmol/L 4.6 4.1 3.7  Chloride 98 - 110 mmol/L 107 106 106  CO2 20 - 32 mmol/L 26 26 23   Calcium 8.6 - 10.4 mg/dL 9.9 9.7 9.5    Vital signs: HR 62, BP 143/53, weight 258 lb  ECHO: Date 08/26/2017, EF 30 to 35%, hypokinesis of anteroseptal myocardium, grade 1 diastolic dysfunction, moderate MV regurgitation  Cath: Date 04/21/2017, EF 25 to 35% visual, Ost Cx to prox cx lesion 30% stenosed   DRUGS TO AVOID IN HEART FAILURE  Drug or Class Mechanism  Analgesics . NSAIDs . COX-2 inhibitors . Glucocorticoids  Sodium and water retention, increased systemic vascular resistance, decreased response to diuretics   Diabetes Medications . Metformin . Thiazolidinediones o Rosiglitazone (Avandia) o Pioglitazone (Actos) . DPP4 Inhibitors o Saxagliptin (Onglyza) o Sitagliptin (Januvia)   Lactic acidosis Possible calcium channel blockade   Unknown  Antiarrhythmics . Class I  o Flecainide o Disopyramide . Class III o Sotalol . Other o Dronedarone  Negative inotrope, proarrhythmic   Proarrhythmic, beta blockade  Negative inotrope  Antihypertensives . Alpha Blockers o Doxazosin . Calcium Channel Blockers o Diltiazem o Verapamil o Nifedipine . Central Alpha Adrenergics o Moxonidine . Peripheral Vasodilators o Minoxidil  Increases renin and aldosterone  Negative inotrope    Possible sympathetic withdrawal  Unknown  Anti-infective . Itraconazole . Amphotericin B  Negative inotrope Unknown  Hematologic . Anagrelide . Cilostazol   Possible inhibition of PD IV Inhibition of PD III causing arrhythmias   Neurologic/Psychiatric . Stimulants . Anti-Seizure Drugs o Carbamazepine o Pregabalin . Antidepressants o Tricyclics o Citalopram . Parkinsons o Bromocriptine o Pergolide o Pramipexole . Antipsychotics o Clozapine . Antimigraine o Ergotamine o Methysergide . Appetite suppressants . Bipolar o Lithium  Peripheral alpha and beta agonist activity  Negative inotrope and chronotrope Calcium channel blockade  Negative inotrope, proarrhythmic Dose-dependent QT prolongation  Excessive serotonin activity/valvular damage Excessive serotonin activity/valvular damage Unknown  IgE mediated hypersensitivy, calcium channel blockade  Excessive serotonin activity/valvular damage Excessive serotonin activity/valvular damage Valvular damage  Direct myofibrillar degeneration, adrenergic stimulation  Antimalarials . Chloroquine . Hydroxychloroquine Intracellular inhibition of lysosomal enzymes  Urologic Agents . Alpha Blockers o Doxazosin o Prazosin o Tamsulosin o Terazosin  Increased renin and aldosterone  Adapted from Page Carleene Overlie, et al. "Drugs That May Cause or Exacerbate Heart Failure: A Scientific Statement from the [place]." Circulation 2016; 134:e32-e69. DOI: 10.1161/CIR.0000000000000426   COUNSELING POINTS/CLINICAL PEARLS  Carvedilol (Goal: weight less than 85 kg is 25 mg BID, weight greater than 85 kg is 50 mg BID)  Patient should avoid activities requiring coordination until drug effects are realized, as drug may cause dizziness.  This drug may cause diarrhea, nausea, vomiting, arthralgia, back pain, myalgia, headache, vision disorder, erectile dysfunction, reduced libido, or fatigue.  Instruct patient to report signs/symptoms of adverse cardiovascular effects such as hypotension (especially in elderly patients), arrhythmias, syncope, palpitations, angina, or edema.  Drug may mask symptoms of hypoglycemia. Advise diabetic patients to carefully monitor  blood sugar levels.  Patient should take drug with food.  Advise patient against sudden discontinuation of drug.  Entresto (Goal: 97/103 mg twice daily)  Warn female patient to avoid pregnancy during therapy and to report a pregnancy to a physician.  Advise patient to report symptomatic hypotension.  Side effects may include hyperkalemia, cough, dizziness, or renal failure  Furosemide  Drug causes sun-sensitivity. Advise patient to use sunscreen and avoid tanning beds. Patient should avoid activities requiring coordination until drug effects are realized, as drug may cause dizziness, vertigo, or blurred vision. This drug may cause hyperglycemia, hyperuricemia, constipation, diarrhea, loss of appetite, nausea, vomiting, purpuric disorder, cramps, spasticity, asthenia, headache, paresthesia, or scaling eczema. Instruct patient to report unusual bleeding/bruising or signs/symptoms of hypotension, infection, pancreatitis, or ototoxicity (tinnitus, hearing impairment). Advise patient to report signs/symptoms of a severe skin reactions (flu-like symptoms, spreading red rash, or skin/mucous membrane blistering) or erythema multiforme. Instruct patient to eat high-potassium foods during drug therapy, as directed by healthcare professional.  Patient should not drink alcohol while taking this drug.   Spironolactone  Warn patient to report dehydration, hypotension, or symptoms of worsening renal function.  Counsel female patient to report gynecomastia.  Side effects may include diarrhea, nausea, vomiting, abdominal cramping, fever, leg cramps, lethargy, mental confusion, decreased libido, irregular menses, and rash. Suspension: Tell patient to take drug consistently with respect to food, either before or after a meal.  Advise patient to avoid potassium supplements and foods containing high levels of potassium, including salt substitutes.   MEDICATION ADHERENCES TIPS AND STRATEGIES 1. Taking medication  as prescribed improves patient outcomes in heart failure (reduces hospitalizations, improves symptoms, increases survival) 2. Side effects of medications can be managed by decreasing doses, switching agents, stopping drugs, or adding additional therapy. Please let someone in the [facility] know if you have having bothersome side effects so we can modify your regimen. Do not alter your medication regimen without talking to [place].  3. Medication reminders can help patients remember to take drugs on time. If you are missing or forgetting doses you can try linking behaviors, using pill boxes, or an electronic reminder like an alarm on your phone or an app. Some people can also get automated phone calls as medication reminders.   Time spent: 10 minutes  Laural Benes, Pharm.D. Clinical Pharmacist 09/27/2018

## 2018-09-28 ENCOUNTER — Ambulatory Visit: Payer: Medicare Other | Admitting: Family

## 2018-10-11 ENCOUNTER — Other Ambulatory Visit: Payer: Self-pay | Admitting: Family

## 2018-10-31 DIAGNOSIS — M1711 Unilateral primary osteoarthritis, right knee: Secondary | ICD-10-CM | POA: Diagnosis not present

## 2018-10-31 DIAGNOSIS — M1712 Unilateral primary osteoarthritis, left knee: Secondary | ICD-10-CM | POA: Diagnosis not present

## 2018-11-01 ENCOUNTER — Telehealth: Payer: Self-pay | Admitting: Cardiovascular Disease

## 2018-11-01 NOTE — Telephone Encounter (Signed)
    [facility] Pre-operative Risk Assessment    Request for surgical clearance:  1. What type of surgery is being performed? Right TKA traditional knee  2. When is this surgery scheduled? 12/13/18  3. What type of clearance is required (medical clearance vs. Pharmacy clearance to hold med vs. Both)? Not listed  4. Are there any medications that need to be held prior to surgery and how long? Not listed  5. Practice name and name of physician performing surgery? Dr.James Bowers  6. What is your office phone number [phone]   7.   What is your office fax number [phone]  8.   Anesthesia type (None, local, MAC, general) ? general   Kimberly Henry 11/01/2018, 9:55 AM  _________________________________________________________________   (provider comments below)

## 2018-11-01 NOTE — Telephone Encounter (Signed)
   Primary Cardiologist:Muhammad Fletcher Anon, MD  Chart reviewed as part of pre-operative protocol coverage. Because of Kimberly Henry's past medical history and time since last visit, he/she will require a follow-up visit in order to better assess preoperative cardiovascular risk.  Pre-op covering staff: - Please schedule appointment and call patient to inform them. - Please contact requesting surgeon's office via preferred method (i.e, phone, fax) to inform them of need for appointment prior to surgery.    Patient also follows with Jackelyn Hoehn Aura Fey, [place] [facility].   [place], [place]  11/01/2018, 10:10 AM

## 2018-11-01 NOTE — Telephone Encounter (Signed)
Pt scheduled to see Ignacia Bayley, NP, 11/21/2018.  Will route to requesting surgeon's office to make them aware.

## 2018-11-01 NOTE — Telephone Encounter (Signed)
Fax # is incorrect below. Needs faxed to [phone]

## 2018-11-02 ENCOUNTER — Ambulatory Visit: Payer: Self-pay | Admitting: Orthopedic Surgery

## 2018-11-07 ENCOUNTER — Ambulatory Visit (INDEPENDENT_AMBULATORY_CARE_PROVIDER_SITE_OTHER): Payer: Medicare Other

## 2018-11-07 VITALS — BP 112/72 | HR 66 | Temp 97.6°F | Resp 16 | Ht 66.0 in | Wt 256.6 lb

## 2018-11-07 DIAGNOSIS — Z0000 Encounter for general adult medical examination without abnormal findings: Secondary | ICD-10-CM

## 2018-11-07 NOTE — Patient Instructions (Signed)
Ms. Kimberly Henry , Thank you for taking time to come for your Medicare Wellness Visit. I appreciate your ongoing commitment to your health goals. Please review the following plan we discussed and let me know if I can assist you in the future.   Screening recommendations/referrals: Colonoscopy: done 09/04/2003 Mammogram: done 03/19/2009 Bone Density: done 08/28/2014 Recommended yearly ophthalmology/optometry visit for glaucoma screening and checkup Recommended yearly dental visit for hygiene and checkup  Vaccinations: Influenza vaccine: done 07/18/18 Pneumococcal vaccine: done 05/30/15 Tdap vaccine: done 05/02/12 Shingles vaccine: Shingrix discussed. Please contact your pharmacy for coverage information.   Advanced directives: Advance directive discussed with you today. I have provided a copy for you to complete at home and have notarized. Once this is complete please bring a copy in to our office so we can scan it into your chart.  Conditions/risks identified: Recommend increasing physical activity as tolerated for desired weight loss.   Next appointment: Please follow up in one year for your Medicare Annual Wellness visit.     Preventive Care 28 Years and Older, Female Preventive care refers to lifestyle choices and visits with your health care provider that can promote health and wellness. What does preventive care include?  A yearly physical exam. This is also called an annual well check.  Dental exams once or twice a year.  Routine eye exams. Ask your health care provider how often you should have your eyes checked.  Personal lifestyle choices, including:  Daily care of your teeth and gums.  Regular physical activity.  Eating a healthy diet.  Avoiding tobacco and drug use.  Limiting alcohol use.  Practicing safe sex.  Taking low-dose aspirin every day.  Taking vitamin and mineral supplements as recommended by your health care provider. What happens during an annual well  check? The services and screenings done by your health care provider during your annual well check will depend on your age, overall health, lifestyle risk factors, and family history of disease. Counseling  Your health care provider may ask you questions about your:  Alcohol use.  Tobacco use.  Drug use.  Emotional well-being.  Home and relationship well-being.  Sexual activity.  Eating habits.  History of falls.  Memory and ability to understand (cognition).  Work and work Statistician.  Reproductive health. Screening  You may have the following tests or measurements:  Height, weight, and BMI.  Blood pressure.  Lipid and cholesterol levels. These may be checked every 5 years, or more frequently if you are over 69 years old.  Skin check.  Lung cancer screening. You may have this screening every year starting at age 36 if you have a 30-pack-year history of smoking and currently smoke or have quit within the past 15 years.  Fecal occult blood test (FOBT) of the stool. You may have this test every year starting at age 52.  Flexible sigmoidoscopy or colonoscopy. You may have a sigmoidoscopy every 5 years or a colonoscopy every 10 years starting at age 99.  Hepatitis C blood test.  Hepatitis B blood test.  Sexually transmitted disease (STD) testing.  Diabetes screening. This is done by checking your blood sugar (glucose) after you have not eaten for a while (fasting). You may have this done every 1-3 years.  Bone density scan. This is done to screen for osteoporosis. You may have this done starting at age 71.  Mammogram. This may be done every 1-2 years. Talk to your health care provider about how often you should have regular mammograms.  Talk with your health care provider about your test results, treatment options, and if necessary, the need for more tests. Vaccines  Your health care provider may recommend certain vaccines, such as:  Influenza vaccine. This is  recommended every year.  Tetanus, diphtheria, and acellular pertussis (Tdap, Td) vaccine. You may need a Td booster every 10 years.  Zoster vaccine. You may need this after age 44.  Pneumococcal 13-valent conjugate (PCV13) vaccine. One dose is recommended after age 60.  Pneumococcal polysaccharide (PPSV23) vaccine. One dose is recommended after age 4. Talk to your health care provider about which screenings and vaccines you need and how often you need them. This information is not intended to replace advice given to you by your health care provider. Make sure you discuss any questions you have with your health care provider. Document Released: 09/26/2015 Document Revised: 05/19/2016 Document Reviewed: 07/01/2015 Elsevier Interactive Patient Education  2017 [place] Prevention in the Home Falls can cause injuries. They can happen to people of all ages. There are many things you can do to make your home safe and to help prevent falls. What can I do on the outside of my home?  Regularly fix the edges of walkways and driveways and fix any cracks.  Remove anything that might make you trip as you walk through a door, such as a raised step or threshold.  Trim any bushes or trees on the path to your home.  Use bright outdoor lighting.  Clear any walking paths of anything that might make someone trip, such as rocks or tools.  Regularly check to see if handrails are loose or broken. Make sure that both sides of any steps have handrails.  Any raised decks and porches should have guardrails on the edges.  Have any leaves, snow, or ice cleared regularly.  Use sand or salt on walking paths during winter.  Clean up any spills in your garage right away. This includes oil or grease spills. What can I do in the bathroom?  Use night lights.  Install grab bars by the toilet and in the tub and shower. Do not use towel bars as grab bars.  Use non-skid mats or decals in the tub or  shower.  If you need to sit down in the shower, use a plastic, non-slip stool.  Keep the floor dry. Clean up any water that spills on the floor as soon as it happens.  Remove soap buildup in the tub or shower regularly.  Attach bath mats securely with double-sided non-slip rug tape.  Do not have throw rugs and other things on the floor that can make you trip. What can I do in the bedroom?  Use night lights.  Make sure that you have a light by your bed that is easy to reach.  Do not use any sheets or blankets that are too big for your bed. They should not hang down onto the floor.  Have a firm chair that has side arms. You can use this for support while you get dressed.  Do not have throw rugs and other things on the floor that can make you trip. What can I do in the kitchen?  Clean up any spills right away.  Avoid walking on wet floors.  Keep items that you use a lot in easy-to-reach places.  If you need to reach something above you, use a strong step stool that has a grab bar.  Keep electrical cords out of the way.  Do not use floor polish or wax that makes floors slippery. If you must use wax, use non-skid floor wax.  Do not have throw rugs and other things on the floor that can make you trip. What can I do with my stairs?  Do not leave any items on the stairs.  Make sure that there are handrails on both sides of the stairs and use them. Fix handrails that are broken or loose. Make sure that handrails are as long as the stairways.  Check any carpeting to make sure that it is firmly attached to the stairs. Fix any carpet that is loose or worn.  Avoid having throw rugs at the top or bottom of the stairs. If you do have throw rugs, attach them to the floor with carpet tape.  Make sure that you have a light switch at the top of the stairs and the bottom of the stairs. If you do not have them, ask someone to add them for you. What else can I do to help prevent  falls?  Wear shoes that:  Do not have high heels.  Have rubber bottoms.  Are comfortable and fit you well.  Are closed at the toe. Do not wear sandals.  If you use a stepladder:  Make sure that it is fully opened. Do not climb a closed stepladder.  Make sure that both sides of the stepladder are locked into place.  Ask someone to hold it for you, if possible.  Clearly mark and make sure that you can see:  Any grab bars or handrails.  First and last steps.  Where the edge of each step is.  Use tools that help you move around (mobility aids) if they are needed. These include:  Canes.  Walkers.  Scooters.  Crutches.  Turn on the lights when you go into a dark area. Replace any light bulbs as soon as they burn out.  Set up your furniture so you have a clear path. Avoid moving your furniture around.  If any of your floors are uneven, fix them.  If there are any pets around you, be aware of where they are.  Review your medicines with your doctor. Some medicines can make you feel dizzy. This can increase your chance of falling. Ask your doctor what other things that you can do to help prevent falls. This information is not intended to replace advice given to you by your health care provider. Make sure you discuss any questions you have with your health care provider. Document Released: 06/26/2009 Document Revised: 02/05/2016 Document Reviewed: 10/04/2014 Elsevier Interactive Patient Education  2017 [employer].

## 2018-11-07 NOTE — Progress Notes (Signed)
Subjective:   Kimberly Henry is a 78 y.o. female who presents for Medicare Annual (Subsequent) preventive examination.  Review of Systems:   Cardiac Risk Factors include: advanced age (>2men, >4 women);dyslipidemia;hypertension;obesity (BMI >30kg/m2)     Objective:     Vitals: BP 112/72 (BP Location: Left Arm, Patient Position: Sitting, Cuff Size: Large)   Pulse 66   Temp 97.6 F (36.4 C) (Oral)   Resp 16   Ht 5\' 6"  (1.676 m)   Wt 256 lb 9.6 oz (116.4 kg)   SpO2 99%   BMI 41.42 kg/m   Body mass index is 41.42 kg/m.  Advanced Directives 11/07/2018 07/11/2017 06/09/2017 05/26/2017 05/09/2017 04/28/2017 04/21/2017  Does Patient Have a Medical Advance Directive? No No No No No No No  Would patient like information on creating a medical advance directive? Yes (MAU/Ambulatory/Procedural Areas - Information given) - - - - - -    Tobacco Social History   Tobacco Use  Smoking Status Former Smoker  . Years: 10.00  . Types: Cigarettes  . Start date: 09/14/1975  . Last attempt to quit: 06/22/1986  . Years since quitting: 32.4  Smokeless Tobacco Never Used     Counseling given: Not Answered   Clinical Intake:  Pre-visit preparation completed: Yes  Pain : No/denies pain     BMI - recorded: 41.42 Nutritional Status: BMI > 30  Obese Nutritional Risks: None Diabetes: No  How often do you need to have someone help you when you read instructions, pamphlets, or other written materials from your doctor or pharmacy?: 1 - Never What is the last grade level you completed in school?: 12th grade  Interpreter Needed?: No  Information entered by :: Clemetine Marker LPN  Past Medical History:  Diagnosis Date  . Acid phosphatase elevated   . AV block, 1st degree   . CHF (congestive heart failure) ([place])   . COPD (chronic obstructive pulmonary disease) ([place])   . HFrEF (heart failure with reduced ejection fraction) ([place])    a. 04/2017 Echo: EF 30-35%, antsept HK, mild MR, mod dil LA,  mildly dil RA, mod TR, mildly to mod elev PASP.  Marland Kitchen Hx of cervical malignancy   . Hyperlipidemia   . Hypertension   . LBBB (left bundle branch block)   . Metabolic syndrome   . NICM (nonischemic cardiomyopathy) ([name])    a. 04/2017 Echo: EF 30-35%; b. 04/2017 Cath: nonobs dzs.  . Non-obstructive CAD (coronary artery disease)    a. 04/2017 Cath: mild nonobs CAD. EF 25-35%. CO 3.14, CI 1.38. Sev PAH [61/34(47].  . Obesity, Class III, BMI 40-49.9 (morbid obesity) ([place])   . Osteoarthritis of both knees   . PAH (pulmonary arterial hypertension) with portal hypertension ([place])    a. 04/2017 Right Heart Cath: Sev PAH 61/34(47).   Past Surgical History:  Procedure Laterality Date  . ABDOMINAL HYSTERECTOMY  1996  . CATARACT EXTRACTION Left 2008  . RIGHT/LEFT HEART CATH AND CORONARY ANGIOGRAPHY N/A 04/21/2017   Procedure: RIGHT/LEFT HEART CATH AND CORONARY ANGIOGRAPHY;  Surgeon: Wellington Hampshire, MD;  Location: [facility];  Service: Cardiovascular;  Laterality: N/A;   Family History  Problem Relation Age of Onset  . Hypertension Mother   . Congestive Heart Failure Mother   . Congestive Heart Failure Father   . Prostate cancer Son    Social History   Socioeconomic History  . Marital status: Widowed    Spouse name: Not on file  . Number of children: 4  .  Years of education: 47  . Highest education level: 12th grade  Occupational History    Comment: part time  Social Needs  . Financial resource strain: Not hard at all  . Food insecurity:    Worry: Never true    Inability: Never true  . Transportation needs:    Medical: No    Non-medical: No  Tobacco Use  . Smoking status: Former Smoker    Years: 10.00    Types: Cigarettes    Start date: 09/14/1975    Last attempt to quit: 06/22/1986    Years since quitting: 32.4  . Smokeless tobacco: Never Used  Substance and Sexual Activity  . Alcohol use: No    Alcohol/week: 0.0 standard drinks  . Drug use: No  . Sexual activity: Not  Currently  Lifestyle  . Physical activity:    Days per week: 0 days    Minutes per session: 0 min  . Stress: Not at all  Relationships  . Social connections:    Talks on phone: More than three times a week    Gets together: Three times a week    Attends religious service: More than 4 times per year    Active member of club or organization: No    Attends meetings of clubs or organizations: Never    Relationship status: Widowed  Other Topics Concern  . Not on file  Social History Narrative   Patient has 4 grown children, 14 grandchildren & 5 great-grandchildren    Outpatient Encounter Medications as of 11/07/2018  Medication Sig  . acetaminophen (TYLENOL) 500 MG chewable tablet Chew 500 mg by mouth every 6 (six) hours as needed for pain.  Marland Kitchen aspirin EC 81 MG tablet Take 81 mg by mouth daily.  Marland Kitchen atorvastatin (LIPITOR) 40 MG tablet Take 1 tablet (40 mg total) by mouth daily.  . carvedilol (COREG) 6.25 MG tablet TAKE 1 TABLET BY MOUTH TWO  TIMES DAILY WITH MEALS  . digoxin (LANOXIN) 0.125 MG tablet TAKE 1 TABLET BY MOUTH  DAILY  . furosemide (LASIX) 40 MG tablet Take 1 tablet (40 mg total) by mouth daily.  . potassium chloride SA (K-DUR,KLOR-CON) 20 MEQ tablet TAKE 1 TABLET BY MOUTH  DAILY  . sacubitril-valsartan (ENTRESTO) 97-103 MG Take 1 tablet by mouth 2 (two) times daily.  Marland Kitchen spironolactone (ALDACTONE) 25 MG tablet TAKE 1 TABLET BY MOUTH  DAILY   No facility-administered encounter medications on file as of 11/07/2018.     Activities of Daily Living In your present state of health, do you have any difficulty performing the following activities: 11/07/2018 09/27/2018  Hearing? N N  Comment declines hearing aids -  Vision? N N  Comment wears glasses -  Difficulty concentrating or making decisions? N N  Walking or climbing stairs? Y Y  Comment - -  Dressing or bathing? N N  Doing errands, shopping? N N  Preparing Food and eating ? N -  Using the Toilet? N -  In the past six  months, have you accidently leaked urine? N -  Do you have problems with loss of bowel control? N -  Managing your Medications? N -  Managing your Finances? N -  Housekeeping or managing your Housekeeping? N -  Some recent data might be hidden    Patient Care Team: Steele Sizer, MD as PCP - General (Family Medicine) Wellington Hampshire, MD as PCP - Cardiology (Cardiology) Alisa Graff, FNP as Nurse Practitioner (Family Medicine) Wellington Hampshire, MD  as Consulting Physician (Cardiology)    Assessment:   This is a routine wellness examination for [facility].  Exercise Activities and Dietary recommendations Current Exercise Habits: The patient does not participate in regular exercise at present, Exercise limited by: orthopedic condition(s);cardiac condition(s)  Goals    . Increase physical activity     Pt would like to increase physical activity after knee replacement this year.        Fall Risk Fall Risk  11/07/2018 09/27/2018 07/18/2018 03/27/2018 02/21/2018  Falls in the past year? 0 0 0 No No  Number falls in past yr: 0 0 - - -  Injury with Fall? 0 0 - - -  Follow up Falls prevention discussed - - - -   FALL RISK PREVENTION PERTAINING TO THE HOME:  Any stairs in or around the home? Yes  If so, do they handrails? Yes   Home free of loose throw rugs in walkways, pet beds, electrical cords, etc? Yes  Adequate lighting in your home to reduce risk of falls? Yes   ASSISTIVE DEVICES UTILIZED TO PREVENT FALLS:  Life alert? No  Use of a cane, walker or w/c? Yes  Grab bars in the bathroom? No  Shower chair or bench in shower? No  Elevated toilet seat or a handicapped toilet? No   DME ORDERS:  DME order needed?  No   TIMED UP AND GO:  Was the test performed? Yes .  Length of time to ambulate 10 feet: 7 sec.   GAIT:  Appearance of gait: Gait slow, steady and with the use of an assistive device.   Education: Fall risk prevention has been discussed.  Intervention(s)  required? No   Depression Screen PHQ 2/9 Scores 11/07/2018 07/18/2018 03/27/2018 02/21/2018  PHQ - 2 Score 0 0 0 0  PHQ- 9 Score - 0 - -     Cognitive Function     6CIT Screen 11/07/2018  What Year? 0 points  What month? 0 points  What time? 0 points  Count back from 20 0 points  Months in reverse 0 points  Repeat phrase 4 points  Total Score 4    Immunization History  Administered Date(s) Administered  . Influenza, High Dose Seasonal PF 06/22/2016, 07/18/2018  . Influenza, Seasonal, Injecte, Preservative Fre 07/23/2014  . Influenza,inj,Quad PF,6+ Mos 05/30/2015, 07/01/2017  . Influenza-Unspecified 07/10/2013  . Pneumococcal Conjugate-13 05/30/2015  . Pneumococcal Polysaccharide-23 05/02/2012  . Tdap 05/02/2012  . Zoster 07/23/2014    Qualifies for Shingles Vaccine? Yes  Zostavax completed 2015. Due for Shingrix. Education has been provided regarding the importance of this vaccine. Pt has been advised to call insurance company to determine out of pocket expense. Advised may also receive vaccine at local pharmacy or Health Dept. Verbalized acceptance and understanding.  Tdap: Up to date  Flu Vaccine: Up to date  Pneumococcal Vaccine: Up to date   Screening Tests Health Maintenance  Topic Date Due  . TETANUS/TDAP  05/02/2022  . INFLUENZA VACCINE  Completed  . DEXA SCAN  Completed  . PNA vac Low Risk Adult  Completed   Cancer Screenings:  Colorectal Screening: Completed 09/04/2003. No longer required.   Mammogram: Completed 2010. Repeat every year. Pt declines repeat screening.   Bone Density: Completed 08/28/14. Results reflect NORMAL. Repeat every 2 years. Pt would like to postpone at this time.   Lung Cancer Screening: (Low Dose CT Chest recommended if Age 38-80 years, 30 pack-year currently smoking OR have quit w/in 15years.) does not qualify.  Additional Screening:  Hepatitis C Screening: no longer required.   Vision Screening: Recommended annual  ophthalmology exams for early detection of glaucoma and other disorders of the eye. Is the patient up to date with their annual eye exam?  Yes  Who is the provider or what is the name of the office in which the pt attends annual eye exams? Dr. Steffanie Rainwater  Dental Screening: Recommended annual dental exams for proper oral hygiene  Community Resource Referral:  CRR required this visit?  No      Plan:     I have personally reviewed and addressed the Medicare Annual Wellness questionnaire and have noted the following in the patient's chart:  A. Medical and social history B. Use of alcohol, tobacco or illicit drugs  C. Current medications and supplements D. Functional ability and status E.  Nutritional status F.  Physical activity G. Advance directives H. List of other physicians I.  Hospitalizations, surgeries, and ER visits in previous 12 months J.  [place] such as hearing and vision if needed, cognitive and depression L. Referrals and appointments   In addition, I have reviewed and discussed with patient certain preventive protocols, quality metrics, and best practice recommendations. A written personalized care plan for preventive services as well as general preventive health recommendations were provided to patient.   Signed,  Clemetine Marker, LPN Nurse Health Advisor   Nurse Notes: pt doing well overall and preparing for upcoming knee replacement surgery.

## 2018-11-17 NOTE — Progress Notes (Signed)
Office Visit    Patient Name: Kimberly Henry Date of Encounter: 11/21/2018  Primary Care Provider:  Steele Sizer, MD Primary Cardiologist:  Kathlyn Sacramento, MD  Chief Complaint    78 year old female with a history of nonischemic cardiomyopathy, HFrEF, hypertension, hyperlipidemia, obesity, nonobstructive CAD, remote tobacco abuse, COPD, and arthritis of the knees who presents for follow-up and preoperative clearance pending right total knee arthroplasty.  Past Medical History    Past Medical History:  Diagnosis Date  . Acid phosphatase elevated   . AV block, 1st degree   . CHF (congestive heart failure) ([place])   . COPD (chronic obstructive pulmonary disease) ([place])   . HFrEF (heart failure with reduced ejection fraction) ([place])    a. 04/2017 Echo: EF 30-35%, antsept HK, mild MR, mod dil LA, mildly dil RA, mod TR, mildly to mod elev PASP; b. 08/2017 Echo: EF 30-35%, diff HK, antsept HK. Gr1 DD. Mild to mod MR. PASP nl.  . Hx of cervical malignancy   . Hyperlipidemia   . Hypertension   . LBBB (left bundle branch block)   . Metabolic syndrome   . NICM (nonischemic cardiomyopathy) ([place])    a. 04/2017 Echo: EF 30-35%; b. 04/2017 Cath: nonobs dzs; c. 08/2017 Echo: EF 30-35%, diff HK, antsept HK. Gr1 DD. Mild to mod MR. PASP nl.  . Non-obstructive CAD (coronary artery disease)    a. 04/2017 Cath: mild nonobs CAD. EF 25-35%. CO 3.14, CI 1.38. Sev PAH [61/34(47].  . Obesity, Class III, BMI 40-49.9 (morbid obesity) ([place])   . Osteoarthritis of both knees   . PAH (pulmonary arterial hypertension) with portal hypertension ([place])    a. 04/2017 Right Heart Cath: Sev PAH 61/34(47); b. 08/2017 Echo: nl PASP.   Past Surgical History:  Procedure Laterality Date  . ABDOMINAL HYSTERECTOMY  1996  . CATARACT EXTRACTION Left 2008  . RIGHT/LEFT HEART CATH AND CORONARY ANGIOGRAPHY N/A 04/21/2017   Procedure: RIGHT/LEFT HEART CATH AND CORONARY ANGIOGRAPHY;  Surgeon: Wellington Hampshire, MD;  Location: [facility];  Service: Cardiovascular;  Laterality: N/A;    Allergies  No Known Allergies  History of Present Illness    78 year old female with the above complex past medical history including hypertension, hyperlipidemia, nonobstructive CAD, nonischemic cardiomyopathy, HFrEF, obesity, remote tobacco abuse, COPD, and osteoarthritis of the knees.  She was hospitalized in August 2018 in the setting of volume overload with finding of LV dysfunction and EF of 3035%.  Catheterization showed nonobstructive CAD with elevated right heart pressures and pulmonary artery pressure 61/34 (47).  Follow-up echocardiography in December 2018 showed persistent LV dysfunction with an EF of 30 to 35% though pulmonary systolic pressure was normal.  She has been followed closely in the heart failure clinic and has done reasonably well since her last cardiology clinic visit in December 2018.  She has some degree of chronic dyspnea on exertion though right now, her biggest limiting factor is right knee pain.  She is pending right total knee arthroplasty which is tentatively scheduled for April 1.  Though activity is limited, she denies ever experiencing chest pain, palpitations, PND, orthopnea, dizziness, syncope, or early satiety.  Though she sometimes notes lower extremity swelling, this has not been an issue recently.  In the setting of her inactivity, weight has gone up some but volume has been stable.  Home Medications    Prior to Admission medications   Medication Sig Start Date End Date Taking? Authorizing Provider  acetaminophen (TYLENOL) 500 MG  chewable tablet Chew 500 mg by mouth every 6 (six) hours as needed for pain.    [provider]  aspirin EC 81 MG tablet Take 81 mg by mouth daily.    [provider]  atorvastatin (LIPITOR) 40 MG tablet Take 1 tablet (40 mg total) by mouth daily. 02/21/18   Alisa Graff, FNP  carvedilol (COREG) 6.25 MG tablet TAKE 1 TABLET BY MOUTH TWO  TIMES  DAILY WITH MEALS 07/20/18   Alisa Graff, FNP  digoxin (LANOXIN) 0.125 MG tablet TAKE 1 TABLET BY MOUTH  DAILY 07/18/18   Darylene Price A, FNP  furosemide (LASIX) 40 MG tablet Take 1 tablet (40 mg total) by mouth daily. 02/21/18   Alisa Graff, FNP  potassium chloride SA (K-DUR,KLOR-CON) 20 MEQ tablet TAKE 1 TABLET BY MOUTH  DAILY 10/11/18   Darylene Price A, FNP  sacubitril-valsartan (ENTRESTO) 97-103 MG Take 1 tablet by mouth 2 (two) times daily. 04/18/18   Alisa Graff, FNP  spironolactone (ALDACTONE) 25 MG tablet TAKE 1 TABLET BY MOUTH  DAILY 07/20/18   Alisa Graff, FNP    Review of Systems    Overall doing well from a cardiac standpoint.  She has some degree of chronic dyspnea on exertion though right knee pain limits her activity more than anything right now. She denies chest pain, palpitations, pnd, orthopnea, n, v, dizziness, syncope, edema, or early satiety. All other systems reviewed and are otherwise negative except as noted above.  Physical Exam    VS:  BP (!) 138/58 (BP Location: Left Arm, Patient Position: Sitting, Cuff Size: Large)   Pulse 61   Ht 5\' 6"  (1.676 m)   Wt 257 lb 8 oz (116.8 kg)   BMI 41.56 kg/m  , BMI Body mass index is 41.56 kg/m. GEN: Well nourished, well developed, in no acute distress. HEENT: normal. Neck: Supple, no JVD, carotid bruits, or masses. Cardiac: RRR, no murmurs, rubs, or gallops. No clubbing, cyanosis, edema.  Radials/PT 2+ and equal bilaterally.  Respiratory:  Respirations regular and unlabored, clear to auscultation bilaterally. GI: Soft, nontender, nondistended, BS + x 4. MS: no deformity or atrophy. Skin: warm and dry, no rash. Neuro:  Strength and sensation are intact. Psych: Normal affect.  Accessory Clinical Findings    ECG personally reviewed by me today -regular sinus rhythm, 61, first-degree AV block, left bundle branch block- no acute changes.  Lab Results  Component Value Date   CREATININE 0.83 07/18/2018   BUN 16  07/18/2018   NA 140 07/18/2018   K 4.6 07/18/2018   CL 107 07/18/2018   CO2 26 07/18/2018   Lab Results  Component Value Date   WBC 4.4 07/18/2018   HGB 11.4 (L) 07/18/2018   HCT 33.5 (L) 07/18/2018   MCV 91.5 07/18/2018   PLT 293 07/18/2018    Lab Results  Component Value Date   CHOL 134 07/18/2018   HDL 41 (L) 07/18/2018   LDLCALC 75 07/18/2018   TRIG 93 07/18/2018   CHOLHDL 3.3 07/18/2018    Assessment & Plan    1.  HFrEF/nonischemic cardiomyopathy: She has been doing well is followed closely in the heart failure clinic.  Activity has been limited in the setting of significant right knee pain and weight is up slightly over prior weights though she is euvolemic today.  She remains on carvedilol, digoxin, Entresto, and Spironolactone therapy.  She is not interested in ICD therapy.  Lab work from November evaluated and stable.  I will send off a digoxin level today as this has not been checked since 2018.  Her blood pressure is slightly elevated today and we did discuss potentially adding BiDil however, I note that a recent blood pressure from a preop visit was more normal and instead, I have asked her to use her blood pressure cuff to follow her pressures at home and contact us if systolics are persistently running greater than 130.  2.  Preoperative cardiovascular examination/osteoarthritis: Pending right total knee arthroplasty.  Patient's activity is limited secondary to knee pain but she denies any chest pain or dyspnea.  She was previous evaluated cardiac catheterization 2018 showing nonobstructive disease.  She does have persistent LV dysfunction by echocardiogram in December 2018 with an EF of 30 to 35%.  She will not require any additional ischemic evaluation and may proceed with knee surgery as planned.  She is low risk for major cardiac complications though we will need to watch her volume status closely perioperatively.  She should continue all of her cardiac medications  throughout the perioperative period.  3.  Essential hypertension: Mildly elevated today.  She has a cuff at home but does not use it.  I have asked her to start checking her pressure on a daily basis and contact us in 1 to 2 weeks with some numbers.  We would consider adding BiDil to her current regimen.  4.  Hyperlipidemia: She remains on atorvastatin therapy with an LDL of 75 in November 2019.  5.  Disposition: Patient may proceed with knee surgery as planned on April 1 without further cardiac evaluation.  Follow-up with Dr. Fletcher Anon in 3 months or sooner if necessary.  Yetta Glassman am acting as a Education administrator for Murray Hodgkins, NP.  I have reviewed the above documentation for accuracy and completeness, and I agree with the above.   Murray Hodgkins, NP 11/21/2018, 1:59 PM

## 2018-11-21 ENCOUNTER — Ambulatory Visit: Payer: Medicare Other | Admitting: Nurse Practitioner

## 2018-11-21 ENCOUNTER — Encounter: Payer: Self-pay | Admitting: Nurse Practitioner

## 2018-11-21 VITALS — BP 138/58 | HR 61 | Ht 66.0 in | Wt 257.5 lb

## 2018-11-21 DIAGNOSIS — I428 Other cardiomyopathies: Secondary | ICD-10-CM

## 2018-11-21 DIAGNOSIS — I5022 Chronic systolic (congestive) heart failure: Secondary | ICD-10-CM

## 2018-11-21 DIAGNOSIS — Z01810 Encounter for preprocedural cardiovascular examination: Secondary | ICD-10-CM

## 2018-11-21 DIAGNOSIS — M25561 Pain in right knee: Secondary | ICD-10-CM | POA: Diagnosis not present

## 2018-11-21 DIAGNOSIS — I10 Essential (primary) hypertension: Secondary | ICD-10-CM | POA: Diagnosis not present

## 2018-11-21 DIAGNOSIS — M25661 Stiffness of right knee, not elsewhere classified: Secondary | ICD-10-CM | POA: Diagnosis not present

## 2018-11-21 DIAGNOSIS — M1711 Unilateral primary osteoarthritis, right knee: Secondary | ICD-10-CM | POA: Diagnosis not present

## 2018-11-21 NOTE — Patient Instructions (Signed)
Medication Instructions:  Your physician recommends that you continue on your current medications as directed. Please refer to the Current Medication list given to you today.  If you need a refill on your cardiac medications before your next appointment, please call your pharmacy.   Lab work: Your physician recommends that you have lab work today(Digoxin level)   If you have labs (blood work) drawn today and your tests are completely normal, you will receive your results only by: Marland Kitchen MyChart Message (if you have MyChart) OR . A paper copy in the mail If you have any lab test that is abnormal or we need to change your treatment, we will call you to review the results.  Testing/Procedures: None ordered   Follow-Up: At [facility], you and your health needs are our priority.  As part of our continuing mission to provide you with exceptional heart care, we have created designated Provider Care Teams.  These Care Teams include your primary Cardiologist (physician) and Advanced Practice Providers (APPs -  Physician Assistants and Nurse Practitioners) who all work together to provide you with the care you need, when you need it. You will need a follow up appointment in 3 months.  Please see Kathlyn Sacramento, MD.   Any Other Special Instructions Will Be Listed Below (If Applicable). Call the clinic in 2 weeks with BP readings.  How to use a home blood pressure monitor. . Be still. Don't smoke, drink caffeinated beverages or exercise within 30 minutes before measuring your blood pressure. . Sit correctly. Sit with your back straight and supported (on a dining chair, rather than a sofa). Your feet should be flat on the floor and your legs should not be crossed. Your arm should be supported on a flat surface (such as a table) with the upper arm at heart level. Make sure the bottom of the cuff is placed directly above the bend of the elbow.  . Measure at the same time every day. It's important to take  the readings at the same time each day, such as morning and evening. Take reading approximately 1 hour after BP medications.

## 2018-11-22 ENCOUNTER — Telehealth: Payer: Self-pay

## 2018-11-22 LAB — DIGOXIN LEVEL: Digoxin, Serum: 0.7 ng/mL (ref 0.5–0.9)

## 2018-11-22 NOTE — Telephone Encounter (Signed)
Call to patient to discuss lab results. No further orders at this time.   Pt verbalized understanding.   Advised pt to call for any further questions or concerns.

## 2018-11-22 NOTE — Telephone Encounter (Signed)
-----   Message from Theora Gianotti, NP sent at 11/22/2018  8:04 AM EDT ----- Digoxin level ok. Cont current dose.

## 2018-12-01 ENCOUNTER — Inpatient Hospital Stay: Admission: RE | Admit: 2018-12-01 | Payer: Medicare Other | Source: Ambulatory Visit

## 2018-12-01 ENCOUNTER — Other Ambulatory Visit: Payer: Self-pay | Admitting: Family

## 2018-12-01 DIAGNOSIS — I700 Atherosclerosis of aorta: Secondary | ICD-10-CM

## 2018-12-05 IMAGING — CT CT CHEST W/O CM
1 series · 15 of 34 positions shown, 19 images · non-contrast
Comparison: Chest x-ray 03/02/2017

CLINICAL DATA: Cough, congestion, wheezing

EXAM:
CT CHEST WITHOUT CONTRAST
TECHNIQUE: Multidetector CT imaging of the chest was performed following the
standard protocol without IV contrast.

[Series 2: thorax · axial · 0.76mm/px · z∈[-651,-393]mm · 15 of 153 slices shown, 19 images]
[im 12/153  mediastinal]
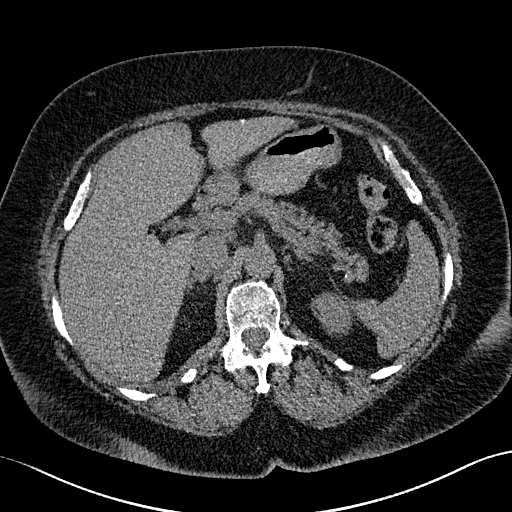
[im 12/153  lung]
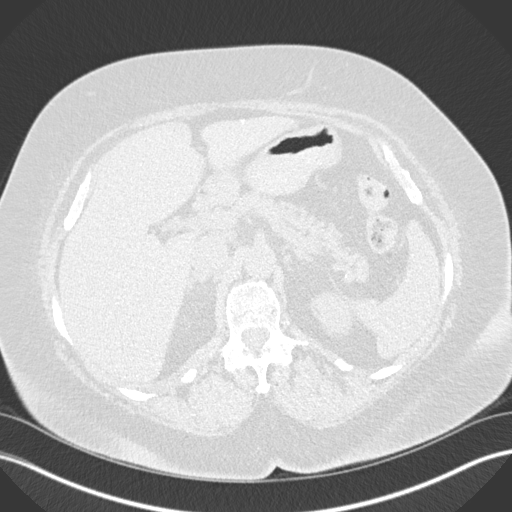
[im 23/153  lung]
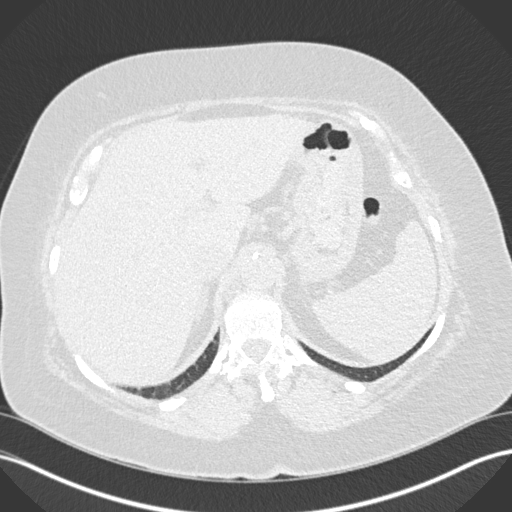
[im 31/153  lung]
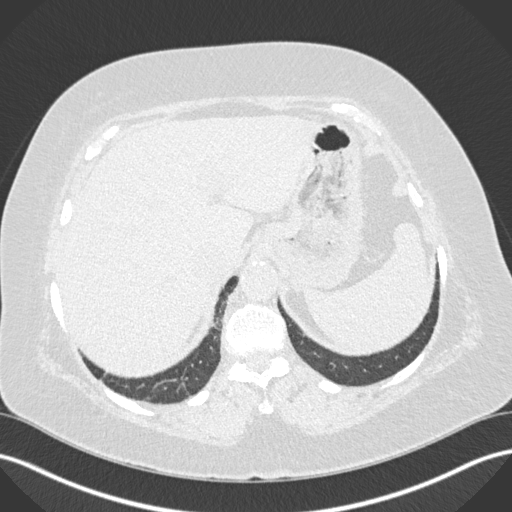
[im 40/153  lung]
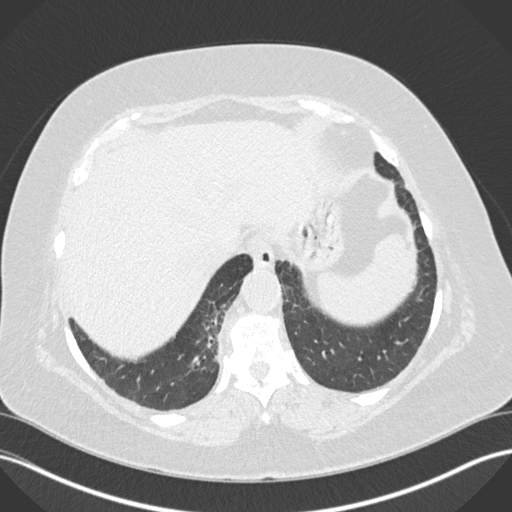
[im 51/153  mediastinal]
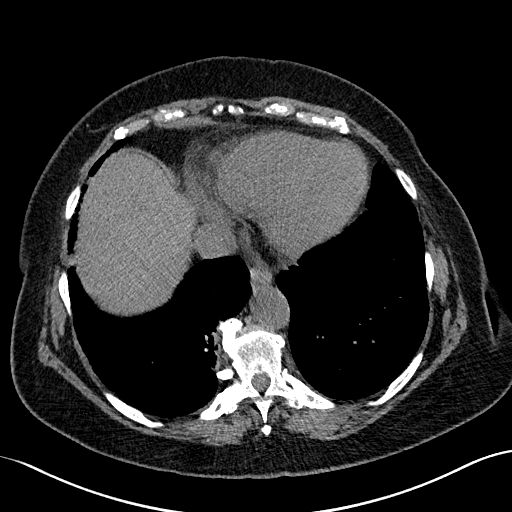
[im 51/153  lung]
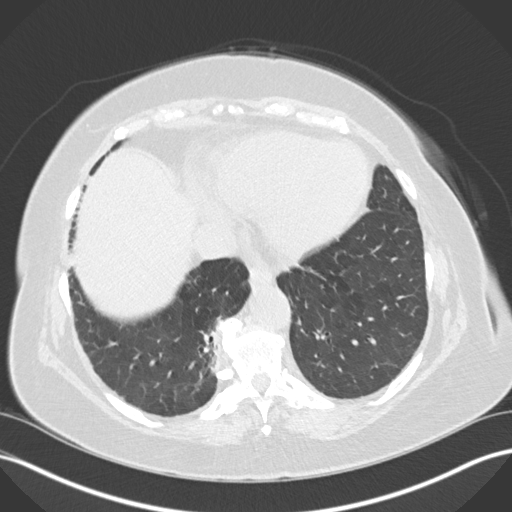
[im 61/153  lung]
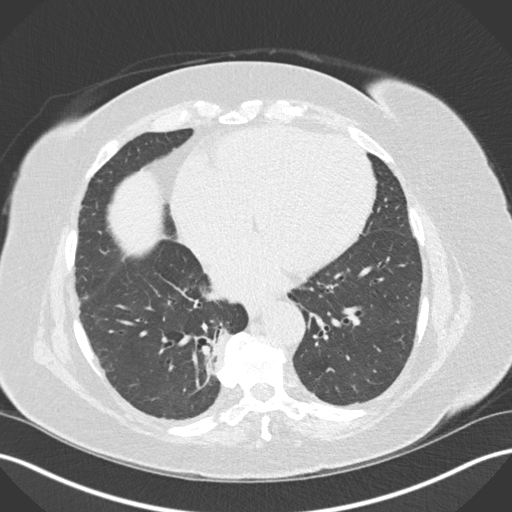
[im 68/153  lung]
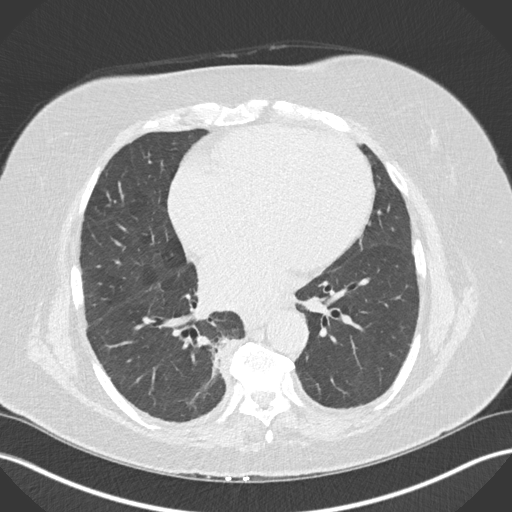
[im 79/153  lung]
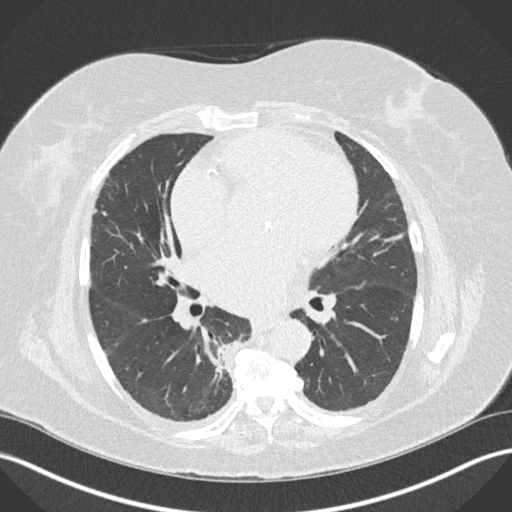
[im 85/153  mediastinal]
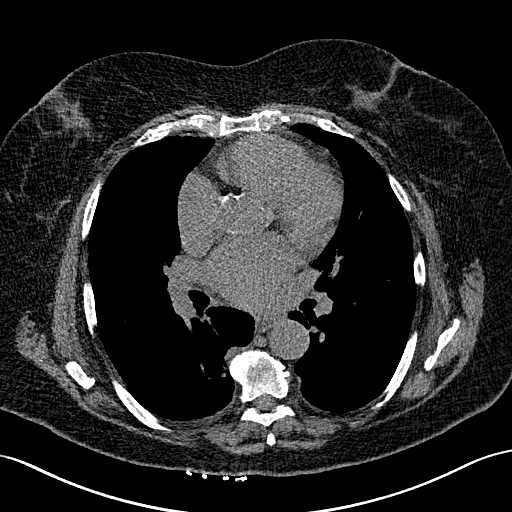
[im 85/153  lung]
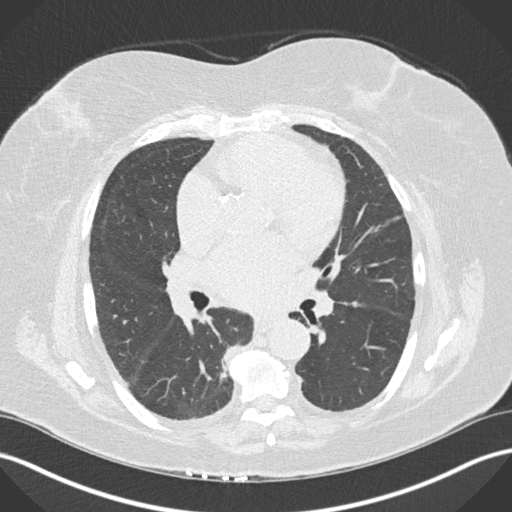
[im 92/153  lung]
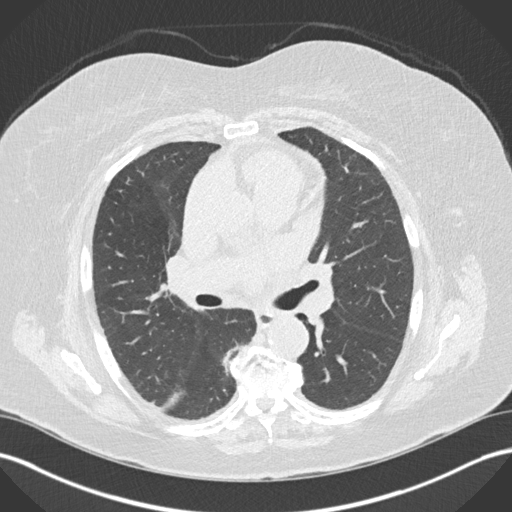
[im 102/153  lung]
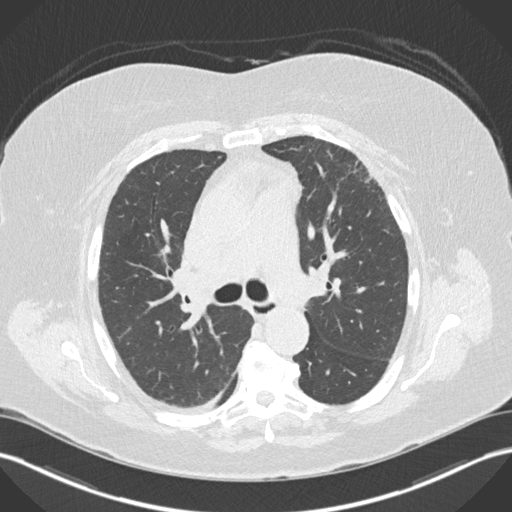
[im 113/153  lung]
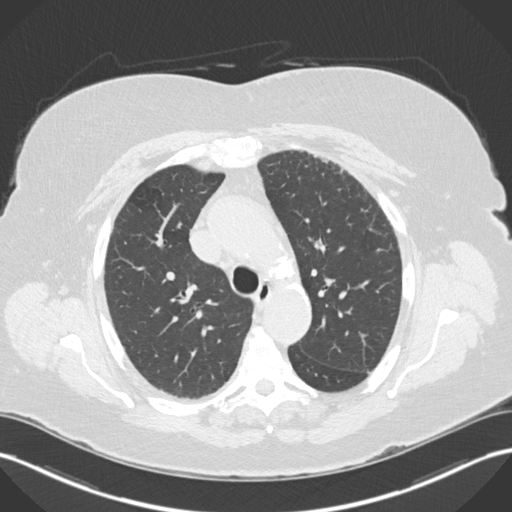
[im 122/153  mediastinal]
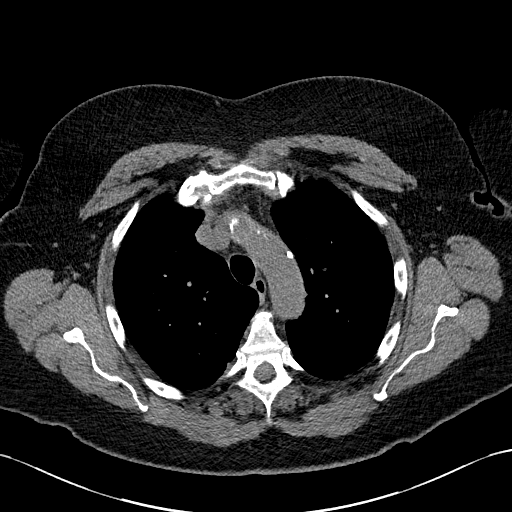
[im 122/153  lung]
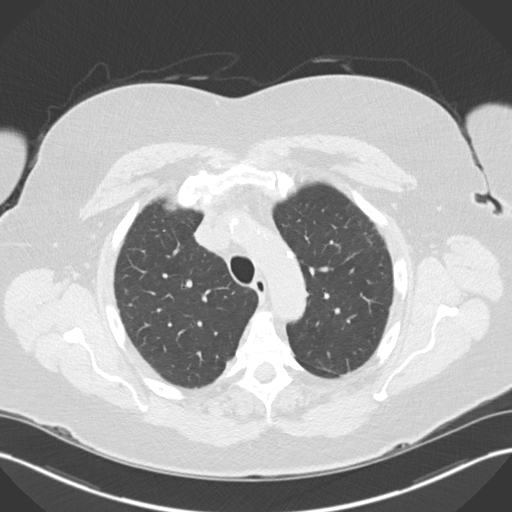
[im 130/153  lung]
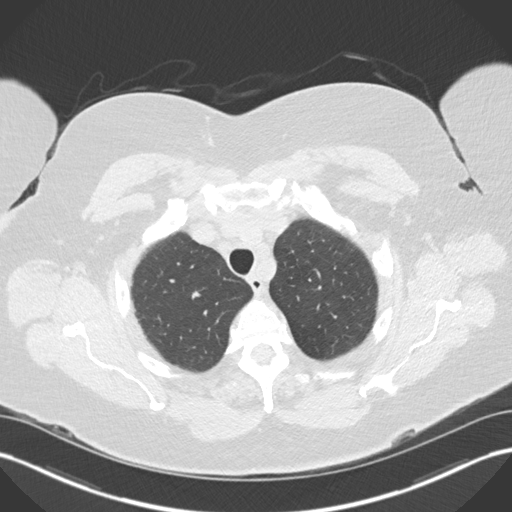
[im 141/153  lung]
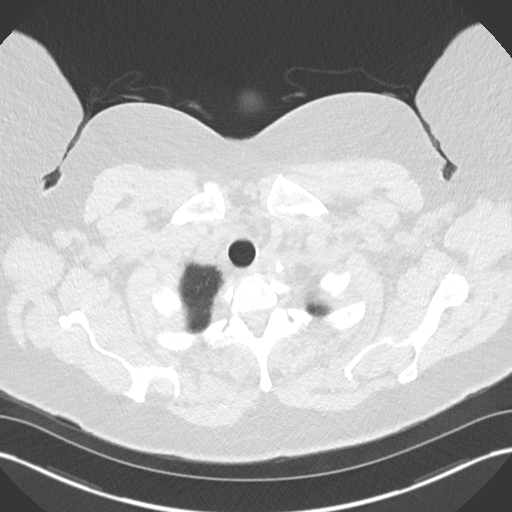

[15 of 34 positions shown; findings below may reference images not displayed]

FINDINGS: Cardiovascular: Cardiomegaly with mild vascular congestion.
Scattered aortic and coronary artery calcifications. No evidence of
aortic aneurysm.

Mediastinum/Nodes: No mediastinal, hilar, or axillary adenopathy. No
visible right hilar adenopathy

Lungs/Pleura: Scarring in the medial right lower lobe. No other
confluent airspace opacities or effusions. 6 mm nodule in the left
lower lobe on image 108.

Upper Abdomen: Imaging into the upper abdomen shows no acute
findings. Small hypodensities in the dome of the liver which cannot
be characterized without intravenous contrast but most likely
represent cysts.

Musculoskeletal: Chest wall soft tissues are unremarkable. No acute
bony abnormality.
IMPRESSION: Cardiomegaly. No visible hilar adenopathy on this unenhanced study.
No visible hilar mass.

Right medial lower lobe scarring.

Coronary artery disease.

6 mm left lower lobe nodule. Non-contrast chest CT at 6-12 months is
recommended. If the nodule is stable at time of repeat CT, then
future CT at 18-24 months (from today's scan) is considered optional
for low-risk patients, but is recommended for high-risk patients.
This recommendation follows the consensus statement: Guidelines for
Management of Incidental Pulmonary Nodules Detected on CT Images:

Aortic Atherosclerosis (SMPKI-TPW.W).

## 2018-12-12 ENCOUNTER — Ambulatory Visit: Payer: Medicare Other | Admitting: Family Medicine

## 2018-12-13 ENCOUNTER — Inpatient Hospital Stay: Admit: 2018-12-13 | Payer: Medicare Other | Admitting: Orthopedic Surgery

## 2018-12-13 SURGERY — ARTHROPLASTY, KNEE, TOTAL
Anesthesia: Choice | Laterality: Right

## 2019-01-11 IMAGING — CR DG CHEST 2V
2 series · 2 of 2 positions shown · non-contrast
Comparison: 03/02/2017

CLINICAL DATA: Shortness of breath, cough, congestion, asthma

EXAM:
CHEST  2 VIEW

[chest pa]
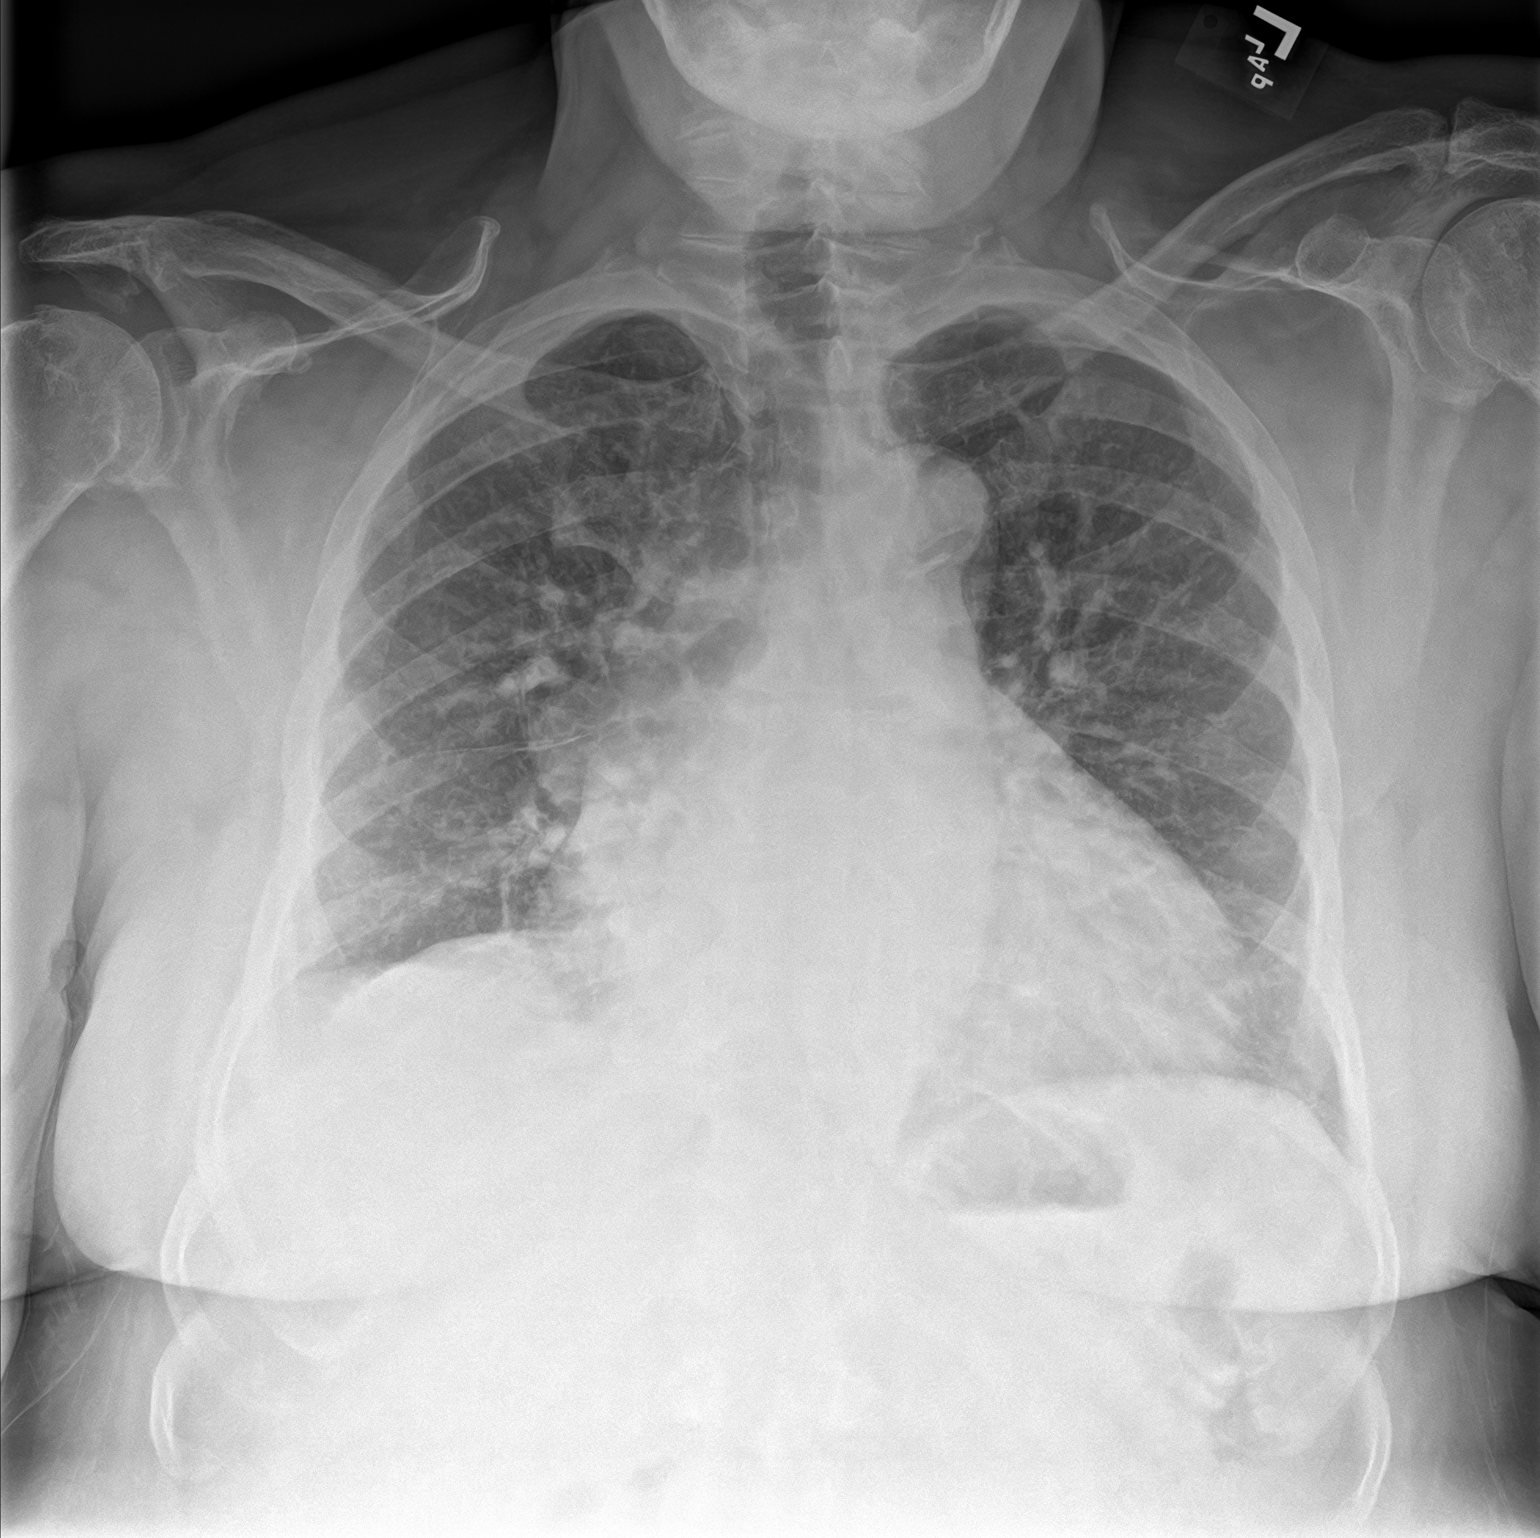

[chest lat]
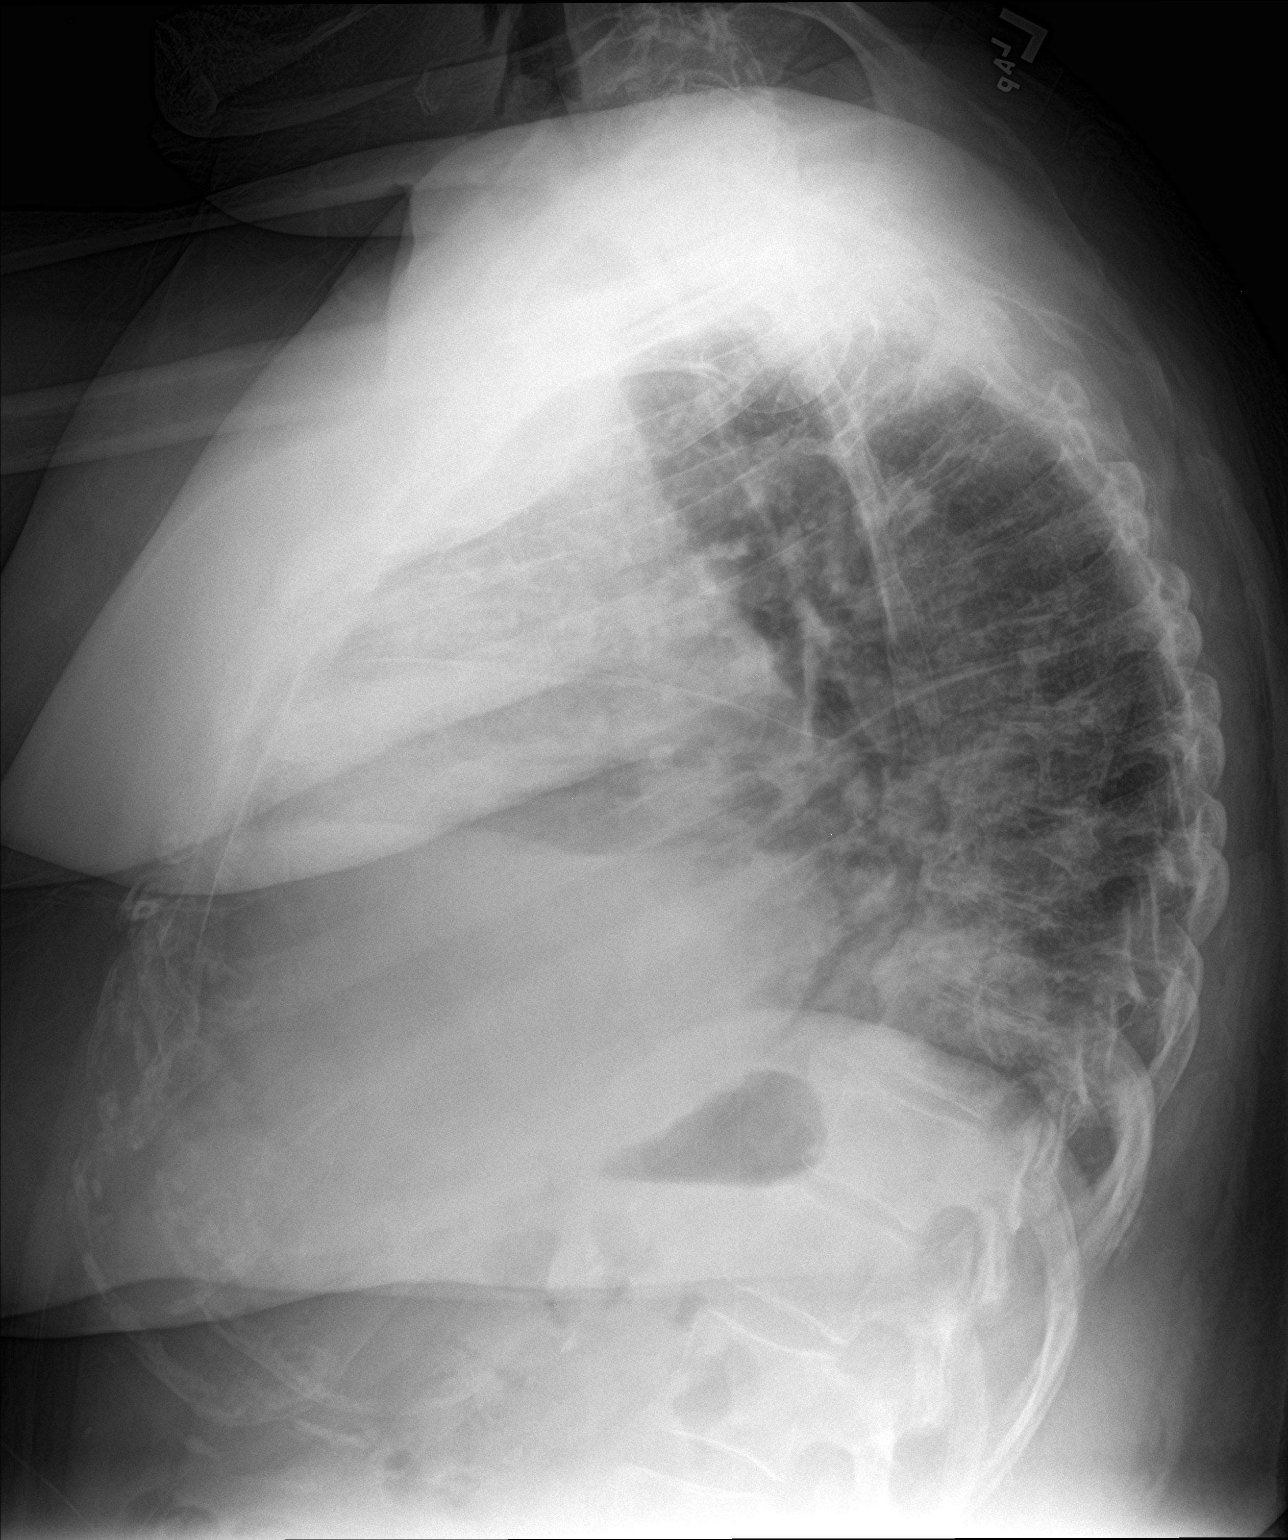

[2 of 2 positions shown; findings below may reference images not displayed]

FINDINGS: Moderate cardiomegaly with increased vascular and interstitial
prominence suspicious for early developing edema. Suspect early CHF.
No significant effusion, focal pneumonia, collapse or consolidation.
No pneumothorax. Trachea is midline. Atherosclerosis of aorta.
Degenerative changes of the spine.
IMPRESSION: Cardiomegaly with vascular congestion and early interstitial edema
pattern compared to the prior study.

Aortic atherosclerosis

## 2019-01-16 ENCOUNTER — Ambulatory Visit: Payer: Medicare Other | Admitting: Family Medicine

## 2019-01-24 ENCOUNTER — Ambulatory Visit: Payer: Self-pay | Admitting: Pharmacist

## 2019-01-24 NOTE — Chronic Care Management (AMB) (Signed)
°  Chronic Care Management   Outreach Note  01/24/2019 Name: EBONIQUE HALLSTROM MRN: [id number] DOB: Jan 03, 1941  Referred by: Steele Sizer, MD Reason for referral : No chief complaint on file.   An unsuccessful telephone outreach was attempted today. The patient was referred to the case management team by for assistance with chronic care management and care coordination.   Follow Up Plan: The CM team will reach out to the patient again over the next 7 days.   [place]  [email or web address]   ??[phone]

## 2019-02-01 ENCOUNTER — Other Ambulatory Visit: Payer: Self-pay | Admitting: Family

## 2019-02-06 ENCOUNTER — Ambulatory Visit: Payer: Self-pay | Admitting: Family Medicine

## 2019-02-06 NOTE — Progress Notes (Signed)
Chronic Care Management   Note  02/06/2019 Name: CYARA DEVOTO MRN: [id number] DOB: [birth year]  Aletha Halim Suber is a 78 y.o. year old female who is a primary care patient of Steele Sizer, MD. I reached out to [facility] by phone today in response to a referral sent by Ms. Aletha Halim Mateo's health plan.    Ms. Eastland was given information about Chronic Care Management services today including:  1. CCM service includes personalized support from designated clinical staff supervised by her physician, including individualized plan of care and coordination with other care providers 2. 24/7 contact phone numbers for assistance for urgent and routine care needs. 3. Service will only be billed when office clinical staff spend 20 minutes or more in a month to coordinate care. 4. Only one practitioner may furnish and bill the service in a calendar month. 5. The patient may stop CCM services at any time (effective at the end of the month) by phone call to the office staff. 6. The patient will be responsible for cost sharing (co-pay) of up to 20% of the service fee (after annual deductible is met).  Patient did not agree to services and does not wish to consider at this time.  Follow up plan: The patient has been provided with contact information for the chronic care management team and has been advised to call with any health related questions or concerns.   [place]  [email or web address]   ??[phone]

## 2019-02-06 NOTE — Progress Notes (Cosign Needed)
Chronic Care Management   Note  02/06/2019 Name: Kimberly Henry MRN: [id number] DOB: 16-Dec-1940  Kimberly Henry is a 78 y.o. year old female who is a primary care patient of Steele Sizer, MD. I reached out to [facility] by phone today in response to a referral sent by Ms. Kimberly Halim Koop's health plan.    [name] was given information about Chronic Care Management services today including:  1. CCM service includes personalized support from designated clinical staff supervised by her physician, including individualized plan of care and coordination with other care providers 2. 24/7 contact phone numbers for assistance for urgent and routine care needs. 3. Service will only be billed when office clinical staff spend 20 minutes or more in a month to coordinate care. 4. Only one practitioner may furnish and bill the service in a calendar month. 5. The patient may stop CCM services at any time (effective at the end of the month) by phone call to the office staff. 6. The patient will be responsible for cost sharing (co-pay) of up to 20% of the service fee (after annual deductible is met).  Patient did not agree to services and does not wish to consider at this time.  Follow up plan: The patient has been provided with contact information for the chronic care management team and has been advised to call with any health related questions or concerns.   [place]  [email or web address]   ??[phone]

## 2019-02-07 ENCOUNTER — Inpatient Hospital Stay: Admit: 2019-02-07 | Payer: Medicare Other | Admitting: Orthopedic Surgery

## 2019-02-07 SURGERY — ARTHROPLASTY, KNEE, TOTAL
Anesthesia: Choice | Laterality: Right

## 2019-02-08 ENCOUNTER — Ambulatory Visit: Payer: Self-pay | Admitting: Family Medicine

## 2019-02-08 NOTE — Progress Notes (Signed)
This encounter was created in error - please disregard.

## 2019-02-08 NOTE — Addendum Note (Signed)
Addended by: Clerance Lav on: 02/08/2019 12:42 PM   Modules accepted: Level of Service, SmartSet

## 2019-02-08 NOTE — Chronic Care Management (AMB) (Signed)
Chronic Care Management   Note  02/08/2019 Name: Kimberly Henry MRN: [id number] DOB: 07-04-1941  Kimberly Henry is a 78 y.o. year old female who is a primary care patient of Steele Sizer, MD. I reached out to [facility] by phone today in response to a referral sent by Kimberly Henry's health plan.    Kimberly Henry was given information about Chronic Care Management services today including:  1. CCM service includes personalized support from designated clinical staff supervised by her physician, including individualized plan of care and coordination with other care providers 2. 24/7 contact phone numbers for assistance for urgent and routine care needs. 3. Service will only be billed when office clinical staff spend 20 minutes or more in a month to coordinate care. 4. Only one practitioner may furnish and bill the service in a calendar month. 5. The patient may stop CCM services at any time (effective at the end of the month) by phone call to the office staff. 6. The patient will be responsible for cost sharing (co-pay) of up to 20% of the service fee (after annual deductible is met).  Patient did not agree to services and does not wish to consider at this time.  Follow up plan: The patient has been provided with contact information for the chronic care management team and has been advised to call with any health related questions or concerns.    [place]  [email or web address]   ??[phone]

## 2019-02-16 ENCOUNTER — Ambulatory Visit: Payer: Medicare Other | Admitting: Cardiovascular Disease

## 2019-02-27 ENCOUNTER — Encounter: Payer: Self-pay | Admitting: Family Medicine

## 2019-02-27 ENCOUNTER — Ambulatory Visit (INDEPENDENT_AMBULATORY_CARE_PROVIDER_SITE_OTHER): Payer: Medicare Other | Admitting: Family Medicine

## 2019-02-27 ENCOUNTER — Other Ambulatory Visit: Payer: Self-pay

## 2019-02-27 VITALS — BP 122/64 | HR 76 | Temp 98.1°F | Resp 16 | Ht 66.0 in | Wt 253.6 lb

## 2019-02-27 DIAGNOSIS — J3089 Other allergic rhinitis: Secondary | ICD-10-CM

## 2019-02-27 DIAGNOSIS — I700 Atherosclerosis of aorta: Secondary | ICD-10-CM | POA: Diagnosis not present

## 2019-02-27 DIAGNOSIS — I472 Ventricular tachycardia, unspecified: Secondary | ICD-10-CM

## 2019-02-27 DIAGNOSIS — Z6841 Body Mass Index (BMI) 40.0 and over, adult: Secondary | ICD-10-CM

## 2019-02-27 DIAGNOSIS — J302 Other seasonal allergic rhinitis: Secondary | ICD-10-CM

## 2019-02-27 DIAGNOSIS — L0292 Furuncle, unspecified: Secondary | ICD-10-CM

## 2019-02-27 DIAGNOSIS — I5022 Chronic systolic (congestive) heart failure: Secondary | ICD-10-CM | POA: Diagnosis not present

## 2019-02-27 DIAGNOSIS — E785 Hyperlipidemia, unspecified: Secondary | ICD-10-CM | POA: Diagnosis not present

## 2019-02-27 DIAGNOSIS — I10 Essential (primary) hypertension: Secondary | ICD-10-CM

## 2019-02-27 NOTE — Progress Notes (Signed)
Name: Kimberly Henry   MRN: [id number]    DOB: 1941/02/03   Date:02/27/2019       Progress Note  Subjective  Chief Complaint  Chief Complaint  Patient presents with  . Recurrent Skin Infections    she had a small boil underneath both of her armpits. Both have resolved. She treated boils with antiobiotic ointment and kept are clean.    HPI  CHF: diagnosed in 04/2017, currently under the care of cardiologist. She is on beta-blocker, digoxin, spironolactone, lasix and entresto. She denies side effects of medication. She denies any current swelling, orthopnea, but has sob with moderate activity. Reviewed labs with patient, still going to CHF clinic.  Boils: she noticed a small bump that was tender on both axillas two weeks ago, she used topical medication and warm compresses and is down now, no breast lump, no drainage, pain resolved   Elevated TSH: found on labs back in 04/2017, last TSH in Nov was back to normal, recheck yearly   HTN: shehas been taking medication daily, no chest pain or palpitation. She gets bp at home and it has been in normal range, but not sure of the values   SVT: she is doing well, no palpitation or fluttering sensation on her chest, doing well since started on CHF medication. Unchanged   OA: she has a long history of OA of both knees.She was due for right knee replacement this Spring but it was postponed because of COVID-19, her pain is very mild, uses a cane for support, she feels some instability at times.   Anemia of chronic disease: she would like to hold off on rechecking labs today, states always had anemia  Perennial allergic rhinitis: she has not been taking medication, but denies sneezing, cough or rhinorrhea. Unchanged   Obesity: she has gained some weight since last visit with cardiologist, she has been unable to exercise because of knee pain, she states she will try to cut down on portion size    Dyslipidemia: she is now on Atorvastatin and is  due for labs, denies myalgia.   Patient Active Problem List   Diagnosis Date Noted  . Ventricular tachycardia ([place]) 07/18/2018  . Morbid obesity with BMI of 40.0-44.9, adult ([place]) 07/18/2018  . Chronic systolic heart failure ([place]) 04/28/2017  . Hypokalemia 04/19/2017  . Elevated troponin 04/19/2017  . Hyperglycemia 04/19/2017  . Aortic atherosclerosis ([place]) 03/10/2017  . Anemia, unspecified 03/08/2017  . Cardiomegaly 03/04/2017  . Perennial allergic rhinitis with seasonal variation 05/30/2015  . 1st degree AV block 03/25/2015  . Morbid (severe) obesity due to excess calories ([place]) 03/25/2015  . Metabolic syndrome 19/14/7829  . History of cervical cancer 03/03/2015  . Hypertension, benign 03/03/2015  . Osteoarthritis, knee 03/03/2015  . Dyslipidemia 03/03/2015    Past Surgical History:  Procedure Laterality Date  . ABDOMINAL HYSTERECTOMY  1996  . CATARACT EXTRACTION Left 2008  . RIGHT/LEFT HEART CATH AND CORONARY ANGIOGRAPHY N/A 04/21/2017   Procedure: RIGHT/LEFT HEART CATH AND CORONARY ANGIOGRAPHY;  Surgeon: Wellington Hampshire, MD;  Location: [facility];  Service: Cardiovascular;  Laterality: N/A;    Family History  Problem Relation Age of Onset  . Hypertension Mother   . Congestive Heart Failure Mother   . Congestive Heart Failure Father   . Prostate cancer Son     Social History   Socioeconomic History  . Marital status: Widowed    Spouse name: Not on file  . Number of children: 4  .  Years of education: 56  . Highest education level: 12th grade  Occupational History    Comment: part time  Social Needs  . Financial resource strain: Not hard at all  . Food insecurity    Worry: Never true    Inability: Never true  . Transportation needs    Medical: No    Non-medical: No  Tobacco Use  . Smoking status: Former Smoker    Years: 10.00    Types: Cigarettes    Start date: 09/14/1975    Quit date: 06/22/1986    Years since quitting: 32.7  . Smokeless  tobacco: Never Used  Substance and Sexual Activity  . Alcohol use: No    Alcohol/week: 0.0 standard drinks  . Drug use: No  . Sexual activity: Not Currently  Lifestyle  . Physical activity    Days per week: 0 days    Minutes per session: 0 min  . Stress: Not at all  Relationships  . Social connections    Talks on phone: More than three times a week    Gets together: Three times a week    Attends religious service: More than 4 times per year    Active member of club or organization: No    Attends meetings of clubs or organizations: Never    Relationship status: Widowed  . Intimate partner violence    Fear of current or ex partner: No    Emotionally abused: No    Physically abused: No    Forced sexual activity: No  Other Topics Concern  . Not on file  Social History Narrative   Patient has 4 grown children, 14 grandchildren & 5 great-grandchildren     Current Outpatient Medications:  .  acetaminophen (TYLENOL) 500 MG chewable tablet, Chew 500 mg by mouth every 6 (six) hours as needed for pain., Disp: , Rfl:  .  aspirin EC 81 MG tablet, Take 81 mg by mouth daily., Disp: , Rfl:  .  atorvastatin (LIPITOR) 40 MG tablet, TAKE 1 TABLET BY MOUTH  DAILY, Disp: 90 tablet, Rfl: 3 .  carvedilol (COREG) 6.25 MG tablet, TAKE 1 TABLET BY MOUTH TWO  TIMES DAILY WITH MEALS, Disp: 180 tablet, Rfl: 3 .  digoxin (LANOXIN) 0.125 MG tablet, TAKE 1 TABLET BY MOUTH  DAILY, Disp: 90 tablet, Rfl: 3 .  ENTRESTO 97-103 MG, TAKE 1 TABLET BY MOUTH TWO  TIMES DAILY, Disp: 180 tablet, Rfl: 3 .  furosemide (LASIX) 40 MG tablet, TAKE 1 TABLET BY MOUTH  DAILY, Disp: 90 tablet, Rfl: 3 .  potassium chloride SA (K-DUR,KLOR-CON) 20 MEQ tablet, TAKE 1 TABLET BY MOUTH  DAILY, Disp: 90 tablet, Rfl: 3 .  spironolactone (ALDACTONE) 25 MG tablet, TAKE 1 TABLET BY MOUTH  DAILY, Disp: 90 tablet, Rfl: 3  No Known Allergies  I personally reviewed active problem list, medication list, allergies, family history, social  history with the patient/caregiver today.   ROS  Constitutional: Negative for fever or weight change.  Respiratory: Negative for cough and shortness of breath.   Cardiovascular: Negative for chest pain or palpitations.  Gastrointestinal: Negative for abdominal pain, no bowel changes.  Musculoskeletal: ,positive for gait problem and right  joint swelling.  Skin: Negative for rash.  Neurological: Negative for dizziness or headache.  No other specific complaints in a complete review of systems (except as listed in HPI above).   Objective  Vitals:   02/27/19 0930  BP: 122/64  Pulse: 76  Resp: 16  Temp: 98.1 F (36.7  C)  TempSrc: Oral  SpO2: 97%  Weight: 253 lb 9.6 oz (115 kg)  Height: 5\' 6"  (1.676 m)    Body mass index is 40.93 kg/m.  Physical Exam  Constitutional: Patient appears well-developed and well-nourished. Obese  No distress.  HEENT: head atraumatic, normocephalic, pupils equal and reactive to light, neck supple Cardiovascular: Normal rate, regular rhythm and normal heart sounds.  No murmur heard. Trace BLE edema. Pulmonary/Chest: Effort normal and breath sounds normal. No respiratory distress. Abdominal: Soft.  There is no tenderness. Skin: able to see area of induration on axilla, scarring but no pain Breast: normal breast exam  Psychiatric: Patient has a normal mood and affect. behavior is normal. Judgment and thought content normal.  PHQ2/9: Depression screen [facility] 2/9 02/27/2019 11/07/2018 07/18/2018 03/27/2018 02/21/2018  Decreased Interest 0 0 0 0 0  Down, Depressed, Hopeless 0 0 0 0 0  PHQ - 2 Score 0 0 0 0 0  Altered sleeping 0 - 0 - -  Tired, decreased energy 0 - 0 - -  Change in appetite 0 - 0 - -  Feeling bad or failure about yourself  0 - 0 - -  Trouble concentrating 0 - 0 - -  Moving slowly or fidgety/restless 0 - 0 - -  Suicidal thoughts 0 - 0 - -  PHQ-9 Score 0 - 0 - -  Difficult doing work/chores - - Not difficult at all - -    phq 9 is  negative  Fall Risk: Fall Risk  02/27/2019 11/07/2018 09/27/2018 07/18/2018 03/27/2018  Falls in the past year? 0 0 0 0 No  Number falls in past yr: 0 0 0 - -  Injury with Fall? 0 0 0 - -  Follow up - Falls prevention discussed - - -    Functional Status Survey: Is the patient deaf or have difficulty hearing?: No Does the patient have difficulty concentrating, remembering, or making decisions?: No Does the patient have difficulty walking or climbing stairs?: Yes Does the patient have difficulty dressing or bathing?: No Does the patient have difficulty doing errands alone such as visiting a doctor's office or shopping?: No    Assessment & Plan  1. Chronic systolic heart failure ([place])  Doing well, keep follow up at CHF clinic   2. Aortic atherosclerosis (HCC)  Continue statin therapy   3. Morbid (severe) obesity due to excess calories [facility])  Discussed with the patient the risk posed by an increased BMI. Discussed importance of portion control, calorie counting and at least 150 minutes of physical activity weekly. Avoid sweet beverages and drink more water. Eat at least 6 servings of fruit and vegetables daily   4. Dyslipidemia   5. Ventricular tachycardia (HCC)  Rate controlled   6. Perennial allergic rhinitis with seasonal variation  Stable  7. Hypertension, benign  At goal   8. Boils  Discussed keeping axilla clean, may use a rag with warm water and a tbsp of clorox bleach before bathing

## 2019-03-27 NOTE — Progress Notes (Deleted)
Patient ID: Kimberly Henry, female    DOB: March 14, 1941, 77 y.o.   MRN: [id number]  HPI  Ms Kimberly Henry is a 78 y/o female with a history of AV block, COPD, hyperlipidemia, HTN, osteoarthritis, remote tobacco use and chronic heart failure.  Echo report from 08/26/17 reviewed and showed an EF of 30-35% along with moderate MR and normal PA pressure. Echo from 04/19/17 reviewed and shows an EF of 30-35% along with mild MR and moderate TR.  Right/Left Cardiac catheterization was done 04/21/17 and showed mild nonobstructive CAD with an EF of 25-35%. Moderately elevated filling pressure, severe pulmonary HTN and severely reduced cardiac output at 3.14 with a cardiac index of 1.38. PA pressure was 61/34 with a mean of 47 mm Hg.   Has not been admitted or been in the ED in the last 6 months.    She presents today for a follow up visit with a chief complaint of   Past Medical History:  Diagnosis Date  . Acid phosphatase elevated   . AV block, 1st degree   . CHF (congestive heart failure) ([place])   . COPD (chronic obstructive pulmonary disease) ([place])   . HFrEF (heart failure with reduced ejection fraction) ([place])    a. 04/2017 Echo: EF 30-35%, antsept HK, mild MR, mod dil LA, mildly dil RA, mod TR, mildly to mod elev PASP; b. 08/2017 Echo: EF 30-35%, diff HK, antsept HK. Gr1 DD. Mild to mod MR. PASP nl.  . Hx of cervical malignancy   . Hyperlipidemia   . Hypertension   . LBBB (left bundle branch block)   . Metabolic syndrome   . NICM (nonischemic cardiomyopathy) ([place])    a. 04/2017 Echo: EF 30-35%; b. 04/2017 Cath: nonobs dzs; c. 08/2017 Echo: EF 30-35%, diff HK, antsept HK. Gr1 DD. Mild to mod MR. PASP nl.  . Non-obstructive CAD (coronary artery disease)    a. 04/2017 Cath: mild nonobs CAD. EF 25-35%. CO 3.14, CI 1.38. Sev PAH [61/34(47].  . Obesity, Class III, BMI 40-49.9 (morbid obesity) ([place])   . Osteoarthritis of both knees   . PAH (pulmonary arterial hypertension) with portal hypertension ([place])    a.  04/2017 Right Heart Cath: Sev PAH 61/34(47); b. 08/2017 Echo: nl PASP.   Past Surgical History:  Procedure Laterality Date  . ABDOMINAL HYSTERECTOMY  1996  . CATARACT EXTRACTION Left 2008  . RIGHT/LEFT HEART CATH AND CORONARY ANGIOGRAPHY N/A 04/21/2017   Procedure: RIGHT/LEFT HEART CATH AND CORONARY ANGIOGRAPHY;  Surgeon: Wellington Hampshire, MD;  Location: [facility];  Service: Cardiovascular;  Laterality: N/A;   Family History  Problem Relation Age of Onset  . Hypertension Mother   . Congestive Heart Failure Mother   . Congestive Heart Failure Father   . Prostate cancer Son    Social History   Tobacco Use  . Smoking status: Former Smoker    Years: 10.00    Types: Cigarettes    Start date: 09/14/1975    Quit date: 06/22/1986    Years since quitting: 32.7  . Smokeless tobacco: Never Used  Substance Use Topics  . Alcohol use: No    Alcohol/week: 0.0 standard drinks   No Known Allergies    Review of Systems  Constitutional: Positive for fatigue. Negative for appetite change.  HENT: Negative for congestion and postnasal drip.   Eyes: Negative.   Respiratory: Positive for shortness of breath (with moderate exertion). Negative for chest tightness.   Cardiovascular: Negative for chest pain, palpitations and  leg swelling.  Gastrointestinal: Negative for abdominal distention and abdominal pain.  Endocrine: Negative.   Genitourinary: Negative.   Musculoskeletal: Positive for arthralgias (right knee pain). Negative for back pain.  Skin: Negative.   Allergic/Immunologic: Negative.   Neurological: Negative for dizziness and light-headedness.  Hematological: Negative for adenopathy. Does not bruise/bleed easily.  Psychiatric/Behavioral: Negative for dysphoric mood and sleep disturbance (sleeping on 1-2 pillows). The patient is not nervous/anxious.      Physical Exam  Constitutional: She is oriented to person, place, and time. She appears well-developed and well-nourished.   HENT:  Head: Normocephalic and atraumatic.  Neck: Normal range of motion. Neck supple. No JVD present.  Cardiovascular: Normal rate and regular rhythm.  Pulmonary/Chest: Effort normal. She has no wheezes. She has no rales.  Abdominal: Soft. She exhibits no distension. There is no abdominal tenderness.  Musculoskeletal:        General: No tenderness or edema.  Neurological: She is alert and oriented to person, place, and time.  Skin: Skin is warm and dry.  Psychiatric: She has a normal mood and affect. Her behavior is normal. Thought content normal.  Nursing note and vitals reviewed.  Assessment & Plan:  1: Chronic heart failure with reduced ejection fraction- - NYHA class II - Euvolemic today - has not been weighing daily; encouraged her to resume weighing daily and to call for an overnight weight gain of >2 pounds or a weekly weight gain of >5 pounds - weight 258 from last visit here 6 months ago - Not adding salt and has been reading food labels; using no sodium seasoning such as Mrs. Dash.  - Does wear support socks & elevates her legs; edema is intermittent - dig level on 11/21/2018 was 0.7 - saw cardiology Sharolyn Douglas) 11/21/2018  - BNP 04/19/17 was 1000.0   2: HTN- - BP  - saw PCP (Sowles) 02/27/2019 - BMP from 07/18/18 reviewed and showed sodium 140, potassium 4.6, creatinine 0.83 and GFR 79  Patient did not bring her medications nor a list. Each medication was verbally reviewed with the patient and she was encouraged to bring the bottles to every visit to confirm accuracy of list.

## 2019-03-28 ENCOUNTER — Ambulatory Visit: Payer: Medicare Other | Admitting: Family

## 2019-04-24 ENCOUNTER — Other Ambulatory Visit: Payer: Self-pay | Admitting: Family

## 2019-08-07 ENCOUNTER — Other Ambulatory Visit: Payer: Self-pay | Admitting: Family

## 2019-08-29 ENCOUNTER — Ambulatory Visit: Payer: Medicare Other | Admitting: Family Medicine

## 2019-10-10 ENCOUNTER — Ambulatory Visit: Payer: Medicare Other | Admitting: Family Medicine

## 2019-10-18 ENCOUNTER — Ambulatory Visit: Payer: Medicare Other | Admitting: Family Medicine

## 2019-11-16 ENCOUNTER — Other Ambulatory Visit: Payer: Self-pay | Admitting: Family

## 2019-12-31 ENCOUNTER — Other Ambulatory Visit: Payer: Self-pay | Admitting: Family

## 2019-12-31 DIAGNOSIS — I700 Atherosclerosis of aorta: Secondary | ICD-10-CM

## 2020-01-03 NOTE — Addendum Note (Signed)
Addended by: Clemetine Marker D on: 01/03/2020 09:58 AM   Modules accepted: Level of Service

## 2020-01-09 ENCOUNTER — Ambulatory Visit (INDEPENDENT_AMBULATORY_CARE_PROVIDER_SITE_OTHER): Payer: Medicare Other | Admitting: Family Medicine

## 2020-01-09 ENCOUNTER — Other Ambulatory Visit: Payer: Self-pay

## 2020-01-09 ENCOUNTER — Encounter: Payer: Self-pay | Admitting: Family Medicine

## 2020-01-09 VITALS — BP 128/68 | HR 99 | Temp 96.8°F | Resp 16 | Ht 66.0 in | Wt 247.2 lb

## 2020-01-09 DIAGNOSIS — E785 Hyperlipidemia, unspecified: Secondary | ICD-10-CM | POA: Diagnosis not present

## 2020-01-09 DIAGNOSIS — J3089 Other allergic rhinitis: Secondary | ICD-10-CM

## 2020-01-09 DIAGNOSIS — M170 Bilateral primary osteoarthritis of knee: Secondary | ICD-10-CM

## 2020-01-09 DIAGNOSIS — I472 Ventricular tachycardia, unspecified: Secondary | ICD-10-CM

## 2020-01-09 DIAGNOSIS — I10 Essential (primary) hypertension: Secondary | ICD-10-CM

## 2020-01-09 DIAGNOSIS — J302 Other seasonal allergic rhinitis: Secondary | ICD-10-CM

## 2020-01-09 DIAGNOSIS — I700 Atherosclerosis of aorta: Secondary | ICD-10-CM | POA: Diagnosis not present

## 2020-01-09 DIAGNOSIS — R911 Solitary pulmonary nodule: Secondary | ICD-10-CM

## 2020-01-09 DIAGNOSIS — I5022 Chronic systolic (congestive) heart failure: Secondary | ICD-10-CM

## 2020-01-09 DIAGNOSIS — R918 Other nonspecific abnormal finding of lung field: Secondary | ICD-10-CM

## 2020-01-09 DIAGNOSIS — D649 Anemia, unspecified: Secondary | ICD-10-CM | POA: Diagnosis not present

## 2020-01-09 DIAGNOSIS — I428 Other cardiomyopathies: Secondary | ICD-10-CM

## 2020-01-09 DIAGNOSIS — R7989 Other specified abnormal findings of blood chemistry: Secondary | ICD-10-CM | POA: Diagnosis not present

## 2020-01-09 MED ORDER — LEVOCETIRIZINE DIHYDROCHLORIDE 5 MG PO TABS: 5.0000 mg | ORAL_TABLET | Freq: Every evening | ORAL | 1 refills | Status: DC

## 2020-01-09 NOTE — Progress Notes (Signed)
Name: ALEKSI COVERSTONE   MRN: [id number]    DOB: Aug 12, 1941   Date:01/09/2020       Progress Note  Subjective  Chief Complaint  Chief Complaint  Patient presents with  . Medication Refill    Lost son on 12/26/2019 to prostate cancer  . Hypertension    Denies any symptoms-wears compression hoses daily  . SVT  . Congestive Heart Failure  . Osteoarthritis  . Dyslipidemia  . Allergic Rhinitis     Needs refill of allergy medication-Xyzal 5 mg used it in the past    HPI  CHF: diagnosed in 04/2017, last visit with CHF clinic and cardiologist was one year ago. . She is on beta-blocker, digoxin, spironolactone, lasix and entresto, she is due for labs. Weight is stable , lost 5 lbs. She denies side effects of medication. She denies any current swelling, she has mild orthopnea, but has sob with moderate activity. Denies PND  . She was diagnosed with NICM  Study Conclusions / ECHO 08/26/2017   - Left ventricle: The cavity size was mildly dilated. Systolic  function was moderately to severely reduced. The estimated  ejection fraction was in the range of 30% to 35%. Diffuse  hypokinesis. Hypokinesis of the anteroseptal myocardium. Doppler  parameters are consistent with abnormal left ventricular  relaxation (grade 1 diastolic dysfunction).  - Mitral valve: There was mild to moderate regurgitation.  - Left atrium: The atrium was moderately dilated.  - Right ventricle: Systolic function was normal.  - Pulmonary arteries: Systolic pressure was within the normal  range.   Elevated TSH: found on labs back in 04/2017, last checked back to normal, we will recheck today.   HTN: shehas been taking medication daily, no chest pain or palpitation  SVT: she is doing well, no palpitation or fluttering sensation on her chest, doing well since started on CHF medication, no longer having symptoms on beta blocker   OA: she has a long history of OA of both knees.Pain has been getting  progressively worse, she would like to see Ortho, causing her to limp. Pain is daily, constant and worse with activity. Today pain is 2/10 sitting, but more intense when standing.  Perennial allergic rhinitis: she has not been taking medication, but she has noticed some rhinorrhea and congestion with increase in pollen count and would like to resume medication  OA knee: she was supposed to have right knee replacement in 2020 but cancelled due to pandemic, she states she is walking better now, she has some pain but not severe but feels stiff, seen by Dr. Harlow Mares and is not interested in having surgery at this time.   Obesity: she has HTN, cardiomyopathy, CHF, OA , she lost 5 lbs since last visit, continue life style modification   Dyslipidemia: she is now on Atorvastatin and is due for labs, denies myalgia.   Lung nodule: we will order CT   Patient Active Problem List   Diagnosis Date Noted  . Ventricular tachycardia ([place]) 07/18/2018  . Morbid obesity with BMI of 40.0-44.9, adult ([place]) 07/18/2018  . Chronic systolic heart failure ([place]) 04/28/2017  . Hypokalemia 04/19/2017  . Elevated troponin 04/19/2017  . Hyperglycemia 04/19/2017  . Aortic atherosclerosis ([place]) 03/10/2017  . Anemia, unspecified 03/08/2017  . Cardiomegaly 03/04/2017  . Perennial allergic rhinitis with seasonal variation 05/30/2015  . 1st degree AV block 03/25/2015  . Morbid (severe) obesity due to excess calories ([place]) 03/25/2015  . Metabolic syndrome [id number]  . History of cervical cancer 03/03/2015  .  Hypertension, benign 03/03/2015  . Osteoarthritis, knee 03/03/2015  . Dyslipidemia 03/03/2015    Past Surgical History:  Procedure Laterality Date  . ABDOMINAL HYSTERECTOMY  1996  . CATARACT EXTRACTION Left 2008  . RIGHT/LEFT HEART CATH AND CORONARY ANGIOGRAPHY N/A 04/21/2017   Procedure: RIGHT/LEFT HEART CATH AND CORONARY ANGIOGRAPHY;  Surgeon: Wellington Hampshire, MD;  Location: [facility];  Service:  Cardiovascular;  Laterality: N/A;    Family History  Problem Relation Age of Onset  . Hypertension Mother   . Congestive Heart Failure Mother   . Congestive Heart Failure Father   . Prostate cancer Son     Social History   Tobacco Use  . Smoking status: Former Smoker    Years: 10.00    Types: Cigarettes    Start date: 09/14/1975    Quit date: 06/22/1986    Years since quitting: 33.5  . Smokeless tobacco: Never Used  Substance Use Topics  . Alcohol use: No    Alcohol/week: 0.0 standard drinks     Current Outpatient Medications:  .  acetaminophen (TYLENOL) 500 MG chewable tablet, Chew 500 mg by mouth every 6 (six) hours as needed for pain., Disp: , Rfl:  .  aspirin EC 81 MG tablet, Take 81 mg by mouth daily., Disp: , Rfl:  .  atorvastatin (LIPITOR) 40 MG tablet, TAKE 1 TABLET BY MOUTH  DAILY, Disp: 90 tablet, Rfl: 3 .  carvedilol (COREG) 6.25 MG tablet, TAKE 1 TABLET BY MOUTH TWO  TIMES DAILY WITH MEALS, Disp: 180 tablet, Rfl: 3 .  digoxin (LANOXIN) 0.125 MG tablet, TAKE 1 TABLET BY MOUTH  DAILY, Disp: 90 tablet, Rfl: 3 .  ENTRESTO 97-103 MG, TAKE 1 TABLET BY MOUTH TWO  TIMES DAILY, Disp: 180 tablet, Rfl: 3 .  furosemide (LASIX) 40 MG tablet, TAKE 1 TABLET BY MOUTH  DAILY, Disp: 90 tablet, Rfl: 3 .  potassium chloride SA (KLOR-CON) 20 MEQ tablet, Take 1 tablet (20 mEq total) by mouth daily. Must make appointment for further refills, Disp: 90 tablet, Rfl: 0 .  spironolactone (ALDACTONE) 25 MG tablet, TAKE 1 TABLET BY MOUTH  DAILY, Disp: 90 tablet, Rfl: 3  No Known Allergies  I personally reviewed active problem list, medication list, allergies, family history, social history, health maintenance with the patient/caregiver today.   ROS  Constitutional: Negative for fever or significant  weight change.  Respiratory: Negative for cough and shortness of breath.   Cardiovascular: Negative for chest pain or palpitations.  Gastrointestinal: Negative for abdominal pain, no bowel  changes.  Musculoskeletal: Negative for gait problem , intermittent fusion joint swelling.  Skin: Negative for rash.  Neurological: Negative for dizziness or headache.  No other specific complaints in a complete review of systems (except as listed in HPI above).  Objective  Vitals:   01/09/20 1414  BP: 128/68  Pulse: 99  Resp: 16  Temp: (!) 96.8 F (36 C)  TempSrc: Temporal  SpO2: 99%  Weight: 247 lb 3.2 oz (112.1 kg)  Height: 5\' 6"  (1.676 m)    Body mass index is 39.9 kg/m.  Physical Exam  Constitutional: Patient appears well-developed and well-nourished. Obese  No distress.  HEENT: head atraumatic, normocephalic, pupils equal and reactive to light Cardiovascular: Normal rate, frequent extra beats otherwise  normal heart sounds.  No murmur heard. No BLE edema. Pulmonary/Chest: Effort normal and breath sounds normal. No respiratory distress. Abdominal: Soft.  There is no tenderness. Muscular Skeletal: crepitus with extension of right knee  Psychiatric: Patient  has a normal mood and affect. behavior is normal. Judgment and thought content normal.  PHQ2/9: Depression screen [facility] 2/9 01/09/2020 02/27/2019 11/07/2018 07/18/2018 03/27/2018  Decreased Interest 0 0 0 0 0  Down, Depressed, Hopeless 0 0 0 0 0  PHQ - 2 Score 0 0 0 0 0  Altered sleeping 0 0 - 0 -  Tired, decreased energy 0 0 - 0 -  Change in appetite 0 0 - 0 -  Feeling bad or failure about yourself  0 0 - 0 -  Trouble concentrating 0 0 - 0 -  Moving slowly or fidgety/restless 0 0 - 0 -  Suicidal thoughts 0 0 - 0 -  PHQ-9 Score 0 0 - 0 -  Difficult doing work/chores Not difficult at all - - Not difficult at all -    phq 9 is negative   Fall Risk: Fall Risk  01/09/2020 02/27/2019 11/07/2018 09/27/2018 07/18/2018  Falls in the past year? 0 0 0 0 0  Number falls in past yr: 0 0 0 0 -  Injury with Fall? 0 0 0 0 -  Follow up - - Falls prevention discussed - -     Functional Status Survey: Is the patient deaf or have  difficulty hearing?: No Does the patient have difficulty seeing, even when wearing glasses/contacts?: No Does the patient have difficulty concentrating, remembering, or making decisions?: No Does the patient have difficulty walking or climbing stairs?: No Does the patient have difficulty dressing or bathing?: No Does the patient have difficulty doing errands alone such as visiting a doctor's office or shopping?: No    Assessment & Plan  1. Chronic systolic heart failure (HCC)  - Digoxin level  2. Dyslipidemia  On statin therapy   3. Hypertension, benign  - COMPLETE METABOLIC PANEL WITH GFR  4. Aortic atherosclerosis (HCC)  - Lipid panel  5. Ventricular tachycardia ([place])  Doing well on beta blocker and no recent episodes of tachycardia   6. Morbid (severe) obesity due to excess calories (HCC)  BMI above 35 with co-morbidities : CHF, dyslipidemia, OA, dyslipidemia  7. Perennial allergic rhinitis with seasonal variation  - levocetirizine (XYZAL) 5 MG tablet; Take 1 tablet (5 mg total) by mouth every evening.  Dispense: 90 tablet; Refill: 1  8. Anemia, unspecified type  - Iron, TIBC and Ferritin Panel - CBC with Differential/Platelet  9. Elevated TSH  - TSH  10. Primary osteoarthritis of both knees  Seen by Ortho, surgery of right knee was postponed secondary to COVID+19 - seen by Dr. Harlow Mares   11. Elevated ferritin level  - Iron, TIBC and Ferritin Panel - CBC with Differential/Platelet

## 2020-01-09 NOTE — Patient Instructions (Addendum)
Dr. Harlow Mares - [facility]  Check were you are going in [place] and contact the Vaccine Passport in [facility]  Dr. Fletcher Anon - Cardiologist

## 2020-01-10 LAB — CBC WITH DIFFERENTIAL/PLATELET
Absolute Monocytes: 293 cells/uL (ref 200–950)
Basophils Absolute: 9 cells/uL (ref 0–200)
Basophils Relative: 0.2 %
Eosinophils Absolute: 81 cells/uL (ref 15–500)
Eosinophils Relative: 1.8 %
HCT: 37.2 % (ref 35.0–45.0)
Hemoglobin: 12.7 g/dL (ref 11.7–15.5)
Lymphs Abs: 1958 cells/uL (ref 850–3900)
MCH: 31.6 pg (ref 27.0–33.0)
MCHC: 34.1 g/dL (ref 32.0–36.0)
MCV: 92.5 fL (ref 80.0–100.0)
MPV: 10.3 fL (ref 7.5–12.5)
Monocytes Relative: 6.5 %
Neutro Abs: 2160 cells/uL (ref 1500–7800)
Neutrophils Relative %: 48 %
Platelets: 289 10*3/uL (ref 140–400)
RBC: 4.02 10*6/uL (ref 3.80–5.10)
RDW: 11.9 % (ref 11.0–15.0)
Total Lymphocyte: 43.5 %
WBC: 4.5 10*3/uL (ref 3.8–10.8)

## 2020-01-10 LAB — COMPLETE METABOLIC PANEL WITH GFR
AG Ratio: 1.5 (calc) (ref 1.0–2.5)
ALT: 12 U/L (ref 6–29)
AST: 13 U/L (ref 10–35)
Albumin: 4.2 g/dL (ref 3.6–5.1)
Alkaline phosphatase (APISO): 111 U/L (ref 37–153)
BUN: 15 mg/dL (ref 7–25)
CO2: 26 mmol/L (ref 20–32)
Calcium: 9.8 mg/dL (ref 8.6–10.4)
Chloride: 106 mmol/L (ref 98–110)
Creat: 0.91 mg/dL (ref 0.60–0.93)
GFR, Est African American: 70 mL/min/{1.73_m2} (ref >=60)
GFR, Est Non African American: 60 mL/min/{1.73_m2} (ref >=60)
Globulin: 2.8 g/dL (calc) (ref 1.9–3.7)
Glucose, Bld: 96 mg/dL (ref 65–99)
Potassium: 4.4 mmol/L (ref 3.5–5.3)
Sodium: 142 mmol/L (ref 135–146)
Total Bilirubin: 0.9 mg/dL (ref 0.2–1.2)
Total Protein: 7 g/dL (ref 6.1–8.1)

## 2020-01-10 LAB — TSH: TSH: 3.21 mIU/L (ref 0.40–4.50)

## 2020-01-10 LAB — LIPID PANEL
Cholesterol: 163 mg/dL (ref <200)
HDL: 40 mg/dL — ABNORMAL LOW (ref >=50)
LDL Cholesterol (Calc): 104 mg/dL (calc) — ABNORMAL HIGH
Non-HDL Cholesterol (Calc): 123 mg/dL (calc) (ref <130)
Total CHOL/HDL Ratio: 4.1 (calc) (ref <5.0)
Triglycerides: 91 mg/dL (ref <150)

## 2020-01-10 LAB — IRON,TIBC AND FERRITIN PANEL
%SAT: 20 % (calc) (ref 16–45)
Ferritin: 229 ng/mL (ref 16–288)
Iron: 60 ug/dL (ref 45–160)
TIBC: 305 mcg/dL (calc) (ref 250–450)

## 2020-01-10 LAB — DIGOXIN LEVEL: Digoxin Level: 0.7 mcg/L — ABNORMAL LOW (ref 0.8–2.0)

## 2020-01-14 ENCOUNTER — Telehealth: Payer: Self-pay | Admitting: Family Medicine

## 2020-01-14 NOTE — Telephone Encounter (Signed)
Copied from [place] [id number]. Topic: General - Other >> Jan 14, 2020  1:36 PM Keene Breath wrote: Reason for CRM: Patient called to ask if she should change her cholesterol medication.  She stated she is not sure if she should change and would like some advice.  Please call to discuss at [phone]

## 2020-01-15 ENCOUNTER — Other Ambulatory Visit: Payer: Self-pay

## 2020-01-15 ENCOUNTER — Encounter: Payer: Self-pay | Admitting: Family

## 2020-01-15 ENCOUNTER — Ambulatory Visit (INDEPENDENT_AMBULATORY_CARE_PROVIDER_SITE_OTHER): Payer: Medicare Other | Admitting: Family

## 2020-01-15 VITALS — BP 110/80 | HR 63 | Ht 67.0 in | Wt 249.0 lb

## 2020-01-15 DIAGNOSIS — I10 Essential (primary) hypertension: Secondary | ICD-10-CM

## 2020-01-15 DIAGNOSIS — I2510 Atherosclerotic heart disease of native coronary artery without angina pectoris: Secondary | ICD-10-CM | POA: Diagnosis not present

## 2020-01-15 DIAGNOSIS — I428 Other cardiomyopathies: Secondary | ICD-10-CM | POA: Diagnosis not present

## 2020-01-15 DIAGNOSIS — I4891 Unspecified atrial fibrillation: Secondary | ICD-10-CM

## 2020-01-15 DIAGNOSIS — E782 Mixed hyperlipidemia: Secondary | ICD-10-CM

## 2020-01-15 DIAGNOSIS — I5022 Chronic systolic (congestive) heart failure: Secondary | ICD-10-CM | POA: Diagnosis not present

## 2020-01-15 MED ORDER — APIXABAN 5 MG PO TABS: 5.0000 mg | ORAL_TABLET | Freq: Two times a day (BID) | ORAL | 1 refills | Status: DC

## 2020-01-15 NOTE — Progress Notes (Signed)
Office Visit    Patient Name: Kimberly Henry Date of Encounter: 01/15/2020  Primary Care Provider:  Steele Sizer, MD Primary Cardiologist:  Kathlyn Sacramento, MD Electrophysiologist:  None   Chief Complaint    Kimberly Henry is a 79 y.o. female with a hx of chronic systolic heart failure/NICM, nonobstructive CAD, COPD, remote tobacco abuse, HLD, aortic atherosclerosis, HTN, VT, morbid obesity, anemia, osteoarthritis presents today for follow-up of HFrEF and CAD  Past Medical History    Past Medical History:  Diagnosis Date  . Acid phosphatase elevated   . AV block, 1st degree   . CHF (congestive heart failure) ([place])   . COPD (chronic obstructive pulmonary disease) ([place])   . HFrEF (heart failure with reduced ejection fraction) ([place])    a. 04/2017 Echo: EF 30-35%, antsept HK, mild MR, mod dil LA, mildly dil RA, mod TR, mildly to mod elev PASP; b. 08/2017 Echo: EF 30-35%, diff HK, antsept HK. Gr1 DD. Mild to mod MR. PASP nl.  . Hx of cervical malignancy   . Hyperlipidemia   . Hypertension   . LBBB (left bundle branch block)   . Metabolic syndrome   . NICM (nonischemic cardiomyopathy) ([place])    a. 04/2017 Echo: EF 30-35%; b. 04/2017 Cath: nonobs dzs; c. 08/2017 Echo: EF 30-35%, diff HK, antsept HK. Gr1 DD. Mild to mod MR. PASP nl.  . Non-obstructive CAD (coronary artery disease)    a. 04/2017 Cath: mild nonobs CAD. EF 25-35%. CO 3.14, CI 1.38. Sev PAH [61/34(47].  . Obesity, Class III, BMI 40-49.9 (morbid obesity) ([place])   . Osteoarthritis of both knees   . PAH (pulmonary arterial hypertension) with portal hypertension ([place])    a. 04/2017 Right Heart Cath: Sev PAH 61/34(47); b. 08/2017 Echo: nl PASP.   Past Surgical History:  Procedure Laterality Date  . ABDOMINAL HYSTERECTOMY  1996  . CARDIAC CATHETERIZATION    . CATARACT EXTRACTION Left 2008  . RIGHT/LEFT HEART CATH AND CORONARY ANGIOGRAPHY N/A 04/21/2017   Procedure: RIGHT/LEFT HEART CATH AND CORONARY ANGIOGRAPHY;  Surgeon:  Wellington Hampshire, MD;  Location: [facility];  Service: Cardiovascular;  Laterality: N/A;    Allergies  No Known Allergies  History of Present Illness    Kimberly Henry is a 79 y.o. female with a hx of chronic systolic heart failure/NICM, nonobstructive CAD, COPD, remote tobacco abuse, HLD, aortic atherosclerosis, HTN, VT, morbid obesity, anemia, osteoarthritis.  She was last seen 11/2018 by Ignacia Bayley, NP.  Hospitalized August 2018 due to volume overload with noted LVEF 38-35%.  Catheterization with nonobstructive CAD with elevated right heart pressures and pulmonary artery pressure 61/34 (47).  Follow-up echo December 2018 persistent LV dysfunction with LVEF 30-35% though pulmonary systolic function normal.  Labs last last week with PCP showing normal kidney function, normal renal function, digoxin level 0.7, TSH 3.21, hemoglobin 12.7 with normal iron panel, total cholesterol 163, HDL 40, LDL 104, triglycerides 91.  Reports no shortness of breath.  Reports dyspnea on exertion is stable at baseline and only occurs when she does more than she is used to.  Reports no chest pain, pressure, or tightness. No edema, orthopnea, PND. Reports no palpitations, irregular heartbeats.  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  She wears compression stockings daily, endorses trying to eat a low-sodium diet.  EKGs/Labs/Other Studies Reviewed:   The following studies were reviewed today:  EKG:  EKG is ordered today.  The ekg ordered today demonstrates  new onset atrial fibrillation 63 bpm with LBBB  Recent Labs: 01/09/2020: ALT 12; BUN 15; Creat 0.91; Hemoglobin 12.7; Platelets 289; Potassium 4.4; Sodium 142; TSH 3.21  Recent Lipid Panel    Component Value Date/Time   CHOL 163 01/09/2020 1515   TRIG 91 01/09/2020 1515   HDL 40 (L) 01/09/2020 1515   CHOLHDL 4.1 01/09/2020 1515   VLDL 22 03/02/2017 1242   LDLCALC 104 (H) 01/09/2020 1515    Home Medications    Current Meds  Medication Sig  . acetaminophen (TYLENOL) 500 MG chewable tablet Chew 500 mg by mouth every 6 (six) hours as needed for pain.  Marland Kitchen atorvastatin (LIPITOR) 40 MG tablet TAKE 1 TABLET BY MOUTH  DAILY  . carvedilol (COREG) 6.25 MG tablet TAKE 1 TABLET BY MOUTH TWO  TIMES DAILY WITH MEALS  . digoxin (LANOXIN) 0.125 MG tablet TAKE 1 TABLET BY MOUTH  DAILY  . ENTRESTO 97-103 MG TAKE 1 TABLET BY MOUTH TWO  TIMES DAILY  . furosemide (LASIX) 40 MG tablet TAKE 1 TABLET BY MOUTH  DAILY  . levocetirizine (XYZAL) 5 MG tablet Take 1 tablet (5 mg total) by mouth every evening.  . potassium chloride SA (KLOR-CON) 20 MEQ tablet Take 1 tablet (20 mEq total) by mouth daily. Must make appointment for further refills  . spironolactone (ALDACTONE) 25 MG tablet TAKE 1 TABLET BY MOUTH  DAILY  . [DISCONTINUED] aspirin EC 81 MG tablet Take 81 mg by mouth daily.      Review of Systems   Review of Systems  Constitution: Negative for chills, fever and malaise/fatigue.  Cardiovascular: Positive for dyspnea on exertion. Negative for chest pain, irregular heartbeat, leg swelling, near-syncope, orthopnea, palpitations and syncope.  Respiratory: Negative for cough, shortness of breath and wheezing.   Gastrointestinal: Negative for melena, nausea and vomiting.  Genitourinary: Negative for hematuria.  Neurological: Negative for dizziness, light-headedness and weakness.   All other systems reviewed and are otherwise negative except as noted above.  Physical Exam    VS:  BP 110/80 (BP Location: Right Arm, Patient Position: Sitting, Cuff Size: Large)   Pulse 63   Ht 5\' 7"  (1.702 m)   Wt 249 lb (112.9 kg)   SpO2 97%   BMI 39.00 kg/m  , BMI Body mass index is 39 kg/m. GEN: Well nourished, well developed, in no acute distress. HEENT: normal. Neck: Supple, no JVD, carotid bruits, or masses. Cardiac: Irregularly irregular, no murmurs, rubs, or gallops. No clubbing, cyanosis, edema.  Radials/PT 2+ and  equal bilaterally.  Respiratory:  Respirations regular and unlabored, clear to auscultation bilaterally. GI: Soft, nontender, nondistended. MS: No deformity or atrophy. Skin: Warm and dry, no rash. Neuro:  Strength and sensation are intact. Psych: Normal affect.  Accessory Clinical Findings    ECG personally reviewed by me today -new onset atrial fibrillation 63 bpm with LBBB- no acute changes.  Assessment & Plan    1. Atrial fibrillation - New finding on exam and EKG today.  EKG today rate controlled atrial fibrillation 63 bpm with LBBB.  She is overall asymptomatic reports no palpitations, irregular heartbeats.  Rate well controlled on present doses of Coreg 6.25 twice daily, digoxin 0.125 mg daily.  Start Eliquis 5 mg twice daily for stroke prevention in setting of CHA2DS2-VASc of at least 5 (agex2, gender, HF, HTN).  Stop aspirin. She was provided of 2 weeks o from mid June f samples of Eliquis.  Long discussion regarding atrial fibrillation etiology, pathophysiology- her father had atrial  fibrillation. Recent labs with normal Hb, TSH, electrolytes, kidney function. Repeat CBC at follow up visit in 3-4 weeks.   2. HFrEF/NICM - Euvolemic and well compensated on exam.  Last echo 2018 with LVEF 30-35%.  Updated echocardiogram ordered.  Continue present GDMT including digoxin 0.125 mg daily, Coreg 6.25 twice daily, Entresto 97-23 mg twice daily, Lasix 40 mg daily, spironolactone 25 mg daily.  3. High risk medication use -digoxin secondary HFrEF PAC.  Recent level 0.7.  No signs or symptoms of toxicity.  4. Mild nonobstructive coronary artery disease -stable with no anginal symptoms.  No indication for ischemic evaluation at this time.  EKG today shows no acute ST/T wave changes.  Continue GDMT including beta-blocker, statin.  Aspirin discontinued today due to initiation of Eliquis, as above.  5. HTN -BP well controlled.  Continue present antihypertensive regimen.  6. HLD -  Recent LDL with  PCP 104.  She tells me her  PCP has changed her atorvastatin to an evening dosing.  Continue atorvastatin 40 mg daily.  Disposition: Echo. Follow up in 3-4 week(s) with Dr. Fletcher Anon or APP with repeat CBC.   Loel Dubonnet, NP 01/15/2020, 2:37 PM

## 2020-01-15 NOTE — Patient Instructions (Addendum)
Medication Instructions:  Your physician has recommended you make the following change in your medication:   STOP Aspirin  START Eliquis 5mg  twice daily This medication is to help protect you from risk of stroke due to your irregular heart beat called atrial fibrillation.   *If you need a refill on your cardiac medications before your next appointment, please call your pharmacy*  Lab Work: None ordered today.   Testing/Procedures: Your EKG today showed rate controlled atrial fibrillation.   Your physician has requested that you have an echocardiogram. Echocardiography is a painless test that uses sound waves to create images of your heart. It provides your doctor with information about the size and shape of your heart and how well your heart's chambers and valves are working. This procedure takes approximately one hour. There are no restrictions for this procedure.  Follow-Up: At [facility], you and your health needs are our priority.  As part of our continuing mission to provide you with exceptional heart care, we have created designated Provider Care Teams.  These Care Teams include your primary Cardiologist (physician) and Advanced Practice Providers (APPs -  Physician Assistants and Nurse Practitioners) who all work together to provide you with the care you need, when you need it.  We recommend signing up for the patient portal called "MyChart".  Sign up information is provided on this After Visit Summary.  MyChart is used to connect with patients for Virtual Visits (Telemedicine).  Patients are able to view lab/test results, encounter notes, upcoming appointments, etc.  Non-urgent messages can be sent to your provider as well.   To learn more about what you can do with MyChart, go to [email or web address].    Your next appointment:   3 week(s)  The format for your next appointment:   In Person  Provider:   You may see Kathlyn Sacramento, MD or one of the following Advanced  Practice Providers on your designated Care Team:    Murray Hodgkins, NP  Christell Faith, PA-C  Marrianne Mood, PA-C  Other Instructions   Atrial Fibrillation  Atrial fibrillation is a type of heartbeat that is irregular or fast. If you have this condition, your heart beats without any order. This makes it hard for your heart to pump blood in a normal way. Atrial fibrillation may come and go, or it may become a long-lasting problem. If this condition is not treated, it can put you at higher risk for stroke, heart failure, and other heart problems. What are the causes? This condition may be caused by diseases that damage the heart. They include:  High blood pressure.  Heart failure.  Heart valve disease.  Heart surgery. Other causes include:  Diabetes.  Thyroid disease.  Being overweight.  Kidney disease. Sometimes the cause is not known. What increases the risk? You are more likely to develop this condition if:  You are older.  You smoke.  You exercise often and very hard.  You have a family history of this condition.  You are a man.  You use drugs.  You drink a lot of alcohol.  You have lung conditions, such as emphysema, pneumonia, or COPD.  You have sleep apnea. What are the signs or symptoms? Common symptoms of this condition include:  A feeling that your heart is beating very fast.  Chest pain or discomfort.  Feeling short of breath.  Suddenly feeling light-headed or weak.  Getting tired easily during activity.  Fainting.  Sweating. In some cases, there are no symptoms.  How is this treated? Treatment for this condition depends on underlying conditions and how you feel when you have atrial fibrillation. They include:  Medicines to: ? Prevent blood clots. ? Treat heart rate or heart rhythm problems.  Using devices, such as a pacemaker, to correct heart rhythm problems.  Doing surgery to remove the part of the heart that sends bad  signals.  Closing an area where clots can form in the heart (left atrial appendage). In some cases, your doctor will treat other underlying conditions. Follow these instructions at home: Medicines  Take over-the-counter and prescription medicines only as told by your doctor.  Do not take any new medicines without first talking to your doctor.  If you are taking blood thinners: ? Talk with your doctor before you take any medicines that have aspirin or NSAIDs, such as ibuprofen, in them. ? Take your medicine exactly as told by your doctor. Take it at the same time each day. ? Avoid activities that could hurt or bruise you. Follow instructions about how to prevent falls. ? Wear a bracelet that says you are taking blood thinners. Or, carry a card that lists what medicines you take. Lifestyle      Do not use any products that have nicotine or tobacco in them. These include cigarettes, e-cigarettes, and chewing tobacco. If you need help quitting, ask your doctor.  Eat heart-healthy foods. Talk with your doctor about the right eating plan for you.  Exercise regularly as told by your doctor.  Do not drink alcohol.  Lose weight if you are overweight.  Do not use drugs, including cannabis. General instructions  If you have a condition that causes breathing to stop for a short period of time (apnea), treat it as told by your doctor.  Keep a healthy weight. Do not use diet pills unless your doctor says they are safe for you. Diet pills may make heart problems worse.  Keep all follow-up visits as told by your doctor. This is important. Contact a doctor if:  You notice a change in the speed, rhythm, or strength of your heartbeat.  You are taking a blood-thinning medicine and you get more bruising.  You get tired more easily when you move or exercise.  You have a sudden change in weight. Get help right away if:   You have pain in your chest or your belly (abdomen).  You have  trouble breathing.  You have side effects of blood thinners, such as blood in your vomit, poop (stool), or pee (urine), or bleeding that cannot stop.  You have any signs of a stroke. "BE FAST" is an easy way to remember the main warning signs: ? B - Balance. Signs are dizziness, sudden trouble walking, or loss of balance. ? E - Eyes. Signs are trouble seeing or a change in how you see. ? F - Face. Signs are sudden weakness or loss of feeling in the face, or the face or eyelid drooping on one side. ? A - Arms. Signs are weakness or loss of feeling in an arm. This happens suddenly and usually on one side of the body. ? S - Speech. Signs are sudden trouble speaking, slurred speech, or trouble understanding what people say. ? T - Time. Time to call emergency services. Write down what time symptoms started.  You have other signs of a stroke, such as: ? A sudden, very bad headache with no known cause. ? Feeling like you may vomit (nausea). ? Vomiting. ? A  seizure. These symptoms may be an emergency. Do not wait to see if the symptoms will go away. Get medical help right away. Call your local emergency services (911 in the [place] Do not drive yourself to the hospital. Summary  Atrial fibrillation is a type of heartbeat that is irregular or fast.  You are at higher risk of this condition if you smoke, are older, have diabetes, or are overweight.  Follow your doctor's instructions about medicines, diet, exercise, and follow-up visits.  Get help right away if you have signs or symptoms of a stroke.  Get help right away if you cannot catch your breath, or you have chest pain or discomfort. This information is not intended to replace advice given to you by your health care provider. Make sure you discuss any questions you have with your health care provider. Document Revised: 02/21/2019 Document Reviewed: 02/21/2019 Elsevier Patient Education  [place].

## 2020-01-15 NOTE — Telephone Encounter (Signed)
She is not sure if she needs to change medication. Please advise.

## 2020-01-16 ENCOUNTER — Other Ambulatory Visit: Payer: Self-pay | Admitting: Family Medicine

## 2020-01-16 DIAGNOSIS — I700 Atherosclerosis of aorta: Secondary | ICD-10-CM

## 2020-01-16 MED ORDER — ATORVASTATIN CALCIUM 40 MG PO TABS: 40.0000 mg | ORAL_TABLET | Freq: Every day | ORAL | 3 refills | Status: DC

## 2020-01-16 NOTE — Telephone Encounter (Signed)
Patient has changed medication. She is taking it every evening.

## 2020-01-16 NOTE — Telephone Encounter (Signed)
Patient called and she said that she had the medications mixed up. She has agreed to change medication and take it in the evening. She would like RX to got to [place].

## 2020-01-16 NOTE — Telephone Encounter (Signed)
Patient returned call

## 2020-01-20 ENCOUNTER — Other Ambulatory Visit: Payer: Self-pay | Admitting: Family

## 2020-01-21 ENCOUNTER — Ambulatory Visit
Admission: RE | Admit: 2020-01-21 | Discharge: 2020-01-21 | Disposition: A | Discharge status: Home / Self Care | Payer: Medicare Other | Source: Ambulatory Visit | Attending: Family Medicine | Admitting: Family Medicine

## 2020-01-21 ENCOUNTER — Other Ambulatory Visit: Payer: Self-pay

## 2020-01-21 DIAGNOSIS — R918 Other nonspecific abnormal finding of lung field: Secondary | ICD-10-CM | POA: Diagnosis not present

## 2020-01-21 DIAGNOSIS — R911 Solitary pulmonary nodule: Secondary | ICD-10-CM | POA: Insufficient documentation

## 2020-01-21 DIAGNOSIS — I700 Atherosclerosis of aorta: Secondary | ICD-10-CM | POA: Insufficient documentation

## 2020-01-21 DIAGNOSIS — I2510 Atherosclerotic heart disease of native coronary artery without angina pectoris: Secondary | ICD-10-CM | POA: Insufficient documentation

## 2020-01-21 DIAGNOSIS — I517 Cardiomegaly: Secondary | ICD-10-CM | POA: Insufficient documentation

## 2020-01-22 ENCOUNTER — Telehealth: Payer: Self-pay | Admitting: Family Medicine

## 2020-01-22 ENCOUNTER — Ambulatory Visit
Admission: RE | Admit: 2020-01-22 | Discharge: 2020-01-22 | Disposition: A | Discharge status: Home / Self Care | Payer: Medicare Other | Source: Ambulatory Visit | Attending: Family | Admitting: Family

## 2020-01-22 ENCOUNTER — Other Ambulatory Visit: Payer: Self-pay | Admitting: Family Medicine

## 2020-01-22 ENCOUNTER — Other Ambulatory Visit: Payer: Self-pay

## 2020-01-22 DIAGNOSIS — I4891 Unspecified atrial fibrillation: Secondary | ICD-10-CM | POA: Insufficient documentation

## 2020-01-22 DIAGNOSIS — I2721 Secondary pulmonary arterial hypertension: Secondary | ICD-10-CM | POA: Diagnosis not present

## 2020-01-22 DIAGNOSIS — I071 Rheumatic tricuspid insufficiency: Secondary | ICD-10-CM | POA: Insufficient documentation

## 2020-01-22 DIAGNOSIS — I447 Left bundle-branch block, unspecified: Secondary | ICD-10-CM | POA: Insufficient documentation

## 2020-01-22 DIAGNOSIS — I428 Other cardiomyopathies: Secondary | ICD-10-CM | POA: Diagnosis not present

## 2020-01-22 DIAGNOSIS — I119 Hypertensive heart disease without heart failure: Secondary | ICD-10-CM | POA: Diagnosis not present

## 2020-01-22 MED ORDER — ROSUVASTATIN CALCIUM 40 MG PO TABS: 40.0000 mg | ORAL_TABLET | Freq: Every day | ORAL | 3 refills | Status: DC

## 2020-01-22 NOTE — Telephone Encounter (Signed)
Pt states Dr Ancil Boozer asked her if she wanted to change her atorvastatin (LIPITOR) 40 MG tablet And switch to something else.  However, a new Rx for atorvastatin was called in, 90 w/ 3 refills.  Pt wants to change from this med.

## 2020-01-22 NOTE — Progress Notes (Signed)
*  PRELIMINARY RESULTS* Echocardiogram 2D Echocardiogram has been performed.  Kimberly Henry 01/22/2020, 10:29 AM

## 2020-01-23 NOTE — Telephone Encounter (Signed)
Patient called.  Patient aware.  

## 2020-02-05 ENCOUNTER — Other Ambulatory Visit: Payer: Self-pay

## 2020-02-05 ENCOUNTER — Encounter: Payer: Self-pay | Admitting: Cardiovascular Disease

## 2020-02-05 ENCOUNTER — Ambulatory Visit: Payer: Medicare Other | Admitting: Cardiovascular Disease

## 2020-02-05 VITALS — BP 128/60 | HR 58 | Ht 67.0 in | Wt 248.0 lb

## 2020-02-05 DIAGNOSIS — I5022 Chronic systolic (congestive) heart failure: Secondary | ICD-10-CM | POA: Diagnosis not present

## 2020-02-05 DIAGNOSIS — E785 Hyperlipidemia, unspecified: Secondary | ICD-10-CM | POA: Diagnosis not present

## 2020-02-05 DIAGNOSIS — I4819 Other persistent atrial fibrillation: Secondary | ICD-10-CM

## 2020-02-05 DIAGNOSIS — I10 Essential (primary) hypertension: Secondary | ICD-10-CM | POA: Diagnosis not present

## 2020-02-05 NOTE — Patient Instructions (Signed)
Medication Instructions:  Your physician recommends that you continue on your current medications as directed. Please refer to the Current Medication list given to you today.  *If you need a refill on your cardiac medications before your next appointment, please call your pharmacy*   Lab Work: None ordered If you have labs (blood work) drawn today and your tests are completely normal, you will receive your results only by: . MyChart Message (if you have MyChart) OR . A paper copy in the mail If you have any lab test that is abnormal or we need to change your treatment, we will call you to review the results.   Testing/Procedures: None ordered   Follow-Up: At [facility], you and your health needs are our priority.  As part of our continuing mission to provide you with exceptional heart care, we have created designated Provider Care Teams.  These Care Teams include your primary Cardiologist (physician) and Advanced Practice Providers (APPs -  Physician Assistants and Nurse Practitioners) who all work together to provide you with the care you need, when you need it.  We recommend signing up for the patient portal called "MyChart".  Sign up information is provided on this After Visit Summary.  MyChart is used to connect with patients for Virtual Visits (Telemedicine).  Patients are able to view lab/test results, encounter notes, upcoming appointments, etc.  Non-urgent messages can be sent to your provider as well.   To learn more about what you can do with MyChart, go to [email or web address].    Your next appointment:   4 month(s)  The format for your next appointment:   In Person  Provider:    You may see Muhammad Arida, MD or one of the following Advanced Practice Providers on your designated Care Team:    Christopher Berge, NP  Ryan Dunn, PA-C  Jacquelyn Visser, PA-C    Other Instructions N/A  

## 2020-02-05 NOTE — Progress Notes (Signed)
Cardiology Office Note   Date:  02/05/2020   ID:  Kimberly Henry [birth year], MRN [id number]  PCP:  Kimberly Sizer, MD  Cardiologist:   Kimberly Sacramento, MD   Chief Complaint  Patient presents with  . Other    Follow up post Echo. meds reviewed verbally with patient.       History of Present Illness: Kimberly Henry is a 79 y.o. female who is here today for follow-up visit regarding chronic systolic heart failure due to nonischemic cardiomyopathy and recently diagnosed atrial fibrillation. She has known history of essential hypertension, obesity, COPD, hyperlipidemia and osteoarthritis.She is a previous smoker but quit many years ago. She was diagnosed with systolic heart failure in August of 2018.   She was noted to have left bundle branch block.  Cardiac catheterization at that time showed mild nonobstructive coronary artery disease with an EF of 25-30%. Right heart catheterization showed severely elevated filling pressures, severely reduced cardiac output and severe pulmonary hypertension. She was treated with medical therapy and has done well with medications.  She declined CRT at some point given improvement in her symptoms.  She was seen by Kimberly Henry recently and was noted to be in atrial fibrillation with minimal symptoms.  She was started on anticoagulation with Eliquis.  She underwent an echocardiogram which was personally reviewed by me.  It showed an EF of 50 to 55%.  Left atrium was severely dilated.  She continues to feel well with no chest pain, shortness of breath or palpitations.  No issues with anticoagulation.  Past Medical History:  Diagnosis Date  . Acid phosphatase elevated   . AV block, 1st degree   . CHF (congestive heart failure) ([place])   . COPD (chronic obstructive pulmonary disease) ([place])   . HFrEF (heart failure with reduced ejection fraction) ([place])    a. 04/2017 Echo: EF 30-35%, antsept HK, mild MR, mod dil LA, mildly dil RA, mod TR, mildly to mod  elev PASP; b. 08/2017 Echo: EF 30-35%, diff HK, antsept HK. Gr1 DD. Mild to mod MR. PASP nl.  . Hx of cervical malignancy   . Hyperlipidemia   . Hypertension   . LBBB (left bundle branch block)   . Metabolic syndrome   . NICM (nonischemic cardiomyopathy) ([place])    a. 04/2017 Echo: EF 30-35%; b. 04/2017 Cath: nonobs dzs; c. 08/2017 Echo: EF 30-35%, diff HK, antsept HK. Gr1 DD. Mild to mod MR. PASP nl.  . Non-obstructive CAD (coronary artery disease)    a. 04/2017 Cath: mild nonobs CAD. EF 25-35%. CO 3.14, CI 1.38. Sev PAH [61/34(47].  . Obesity, Class III, BMI 40-49.9 (morbid obesity) ([place])   . Osteoarthritis of both knees   . PAH (pulmonary arterial hypertension) with portal hypertension ([place])    a. 04/2017 Right Heart Cath: Sev PAH 61/34(47); b. 08/2017 Echo: nl PASP.    Past Surgical History:  Procedure Laterality Date  . ABDOMINAL HYSTERECTOMY  1996  . CARDIAC CATHETERIZATION    . CATARACT EXTRACTION Left 2008  . RIGHT/LEFT HEART CATH AND CORONARY ANGIOGRAPHY N/A 04/21/2017   Procedure: RIGHT/LEFT HEART CATH AND CORONARY ANGIOGRAPHY;  Surgeon: Wellington Hampshire, MD;  Location: [facility];  Service: Cardiovascular;  Laterality: N/A;     Current Outpatient Medications  Medication Sig Dispense Refill  . acetaminophen (TYLENOL) 500 MG chewable tablet Chew 500 mg by mouth every 6 (six) hours as needed for pain.    Marland Kitchen apixaban (ELIQUIS) 5 MG TABS tablet  Take 1 tablet (5 mg total) by mouth 2 (two) times daily. 180 tablet 1  . carvedilol (COREG) 6.25 MG tablet TAKE 1 TABLET BY MOUTH TWO  TIMES DAILY WITH MEALS 180 tablet 3  . digoxin (LANOXIN) 0.125 MG tablet TAKE 1 TABLET BY MOUTH  DAILY 90 tablet 3  . ENTRESTO 97-103 MG TAKE 1 TABLET BY MOUTH TWO  TIMES DAILY 180 tablet 3  . furosemide (LASIX) 40 MG tablet TAKE 1 TABLET BY MOUTH  DAILY 90 tablet 3  . levocetirizine (XYZAL) 5 MG tablet Take 1 tablet (5 mg total) by mouth every evening. 90 tablet 1  . potassium chloride SA (KLOR-CON)  20 MEQ tablet Take 1 tablet (20 mEq total) by mouth daily. Must make appointment for further refills 90 tablet 0  . rosuvastatin (CRESTOR) 40 MG tablet Take 1 tablet (40 mg total) by mouth daily. In place of Atorvastatin 90 tablet 3  . spironolactone (ALDACTONE) 25 MG tablet TAKE 1 TABLET BY MOUTH  DAILY 90 tablet 3   No current facility-administered medications for this visit.    Allergies:   Patient has no known allergies.    Social History:  The patient  reports that she quit smoking about 33 years ago. Her smoking use included cigarettes. She started smoking about 44 years ago. She quit after 10.00 years of use. She has never used smokeless tobacco. She reports that she does not drink alcohol or use drugs.   Family History:  The patient's family history includes Congestive Heart Failure in her father and mother; Hypertension in her mother; Prostate cancer in her son.    ROS:  Please see the history of present illness.   Otherwise, review of systems are positive for none.   All other systems are reviewed and negative.    PHYSICAL EXAM: VS:  BP 128/60 (BP Location: Left Arm, Patient Position: Sitting, Cuff Size: Large)   Pulse (!) 58   Ht 5\' 7"  (1.702 m)   Wt 248 lb (112.5 kg)   SpO2 96%   BMI 38.84 kg/m  , BMI Body mass index is 38.84 kg/m. GEN: Well nourished, well developed, in no acute distress  HEENT: normal  Neck: no carotid bruits, or masses. No significant JVD. Cardiac: Irregularly irregular; no murmurs, rubs, or gallops, no edema Respiratory: Clear to auscultation bilaterally, normal work of breathing GI: soft, nontender, nondistended, + BS MS: no deformity or atrophy  Skin: warm and dry, no rash Neuro:  Strength and sensation are intact Psych: euthymic mood, full affect   EKG:  EKG is not ordered today.    Recent Labs: 01/09/2020: ALT 12; BUN 15; Creat 0.91; Hemoglobin 12.7; Platelets 289; Potassium 4.4; Sodium 142; TSH 3.21    Lipid Panel    Component  Value Date/Time   CHOL 163 01/09/2020 1515   TRIG 91 01/09/2020 1515   HDL 40 (L) 01/09/2020 1515   CHOLHDL 4.1 01/09/2020 1515   VLDL 22 03/02/2017 1242   LDLCALC 104 (H) 01/09/2020 1515      Wt Readings from Last 3 Encounters:  02/05/20 248 lb (112.5 kg)  01/15/20 249 lb (112.9 kg)  01/09/20 247 lb 3.2 oz (112.1 kg)       No flowsheet data found.    ASSESSMENT AND PLAN:  1.  Chronic systolic heart failure: Due to nonischemic cardiomyopathy.  Currently New York Heart Association class II.  She is euvolemic on current dose of furosemide.  She is on optimal medical therapy.  Recent echocardiogram  showed improvement in ejection fraction to 50 to 55%.  Recent labs were unremarkable including digoxin level of 0.7 with normal renal function and electrolytes.  2. Essential hypertension: Blood pressure is well controlled on current medications.  3. Hyperlipidemia: Continue rosuvastatin.  Recent lipid profile showed an LDL of 104.  4.  Atrial fibrillation: This was diagnosed recently.  She does not appear to be symptomatic from this.  Recent echocardiogram showed severely dilated left atrium.  I doubt we will be able to get her back in normal rhythm without an antiarrhythmic medication.  Given that she is minimally symptomatic, it might be best to continue with rate control and anticoagulation.  She is tolerating anticoagulation with Eliquis with no issues.    Disposition:   FU in 4 months  Signed,  Kimberly Sacramento, MD  02/05/2020 4:24 PM    [place]

## 2020-02-21 ENCOUNTER — Telehealth: Payer: Self-pay | Admitting: Family Medicine

## 2020-02-21 NOTE — Chronic Care Management (AMB) (Signed)
  Chronic Care Management   Note  02/21/2020 Name: Kimberly Henry MRN: [id number] DOB: 03-Nov-1940  Kimberly Henry is a 79 y.o. year old female who is a primary care patient of Steele Sizer, MD. I reached out to [facility] by phone today in response to a referral sent by Kimberly Henry's health plan.     Kimberly Henry was given information about Chronic Care Management services today including:  1. CCM service includes personalized support from designated clinical staff supervised by her physician, including individualized plan of care and coordination with other care providers 2. 24/7 contact phone numbers for assistance for urgent and routine care needs. 3. Service will only be billed when office clinical staff spend 20 minutes or more in a month to coordinate care. 4. Only one practitioner may furnish and bill the service in a calendar month. 5. The patient may stop CCM services at any time (effective at the end of the month) by phone call to the office staff. 6. The patient will be responsible for cost sharing (co-pay) of up to 20% of the service fee (after annual deductible is met).  Patient agreed to services and verbal consent obtained.   Follow up plan: Telephone appointment with care management team member scheduled for:03/19/2020  [place], [place], [place] [address] Direct Dial: [email or web address] Website: [email or web address]

## 2020-02-27 ENCOUNTER — Other Ambulatory Visit: Payer: Self-pay | Admitting: Family Medicine

## 2020-02-27 NOTE — Telephone Encounter (Signed)
RX REFILL ENTRESTO 97-103 MG [id number] potassium chloride SA (KLOR-CON) 20 MEQ tablet [[id number]]   PHARMACY [facility], [place], [address] Phone:  [phone]  Fax:  [phone]

## 2020-02-27 NOTE — Telephone Encounter (Signed)
Requested medication (s) are due for refill today: Sacubitril-valsartan, yes  Requested medication (s) are on the active medication list: yes  Last refill: 02/05/20  Future visit scheduled: yes  Notes to clinic:  not delegated; written per Zorita Pang, [place]  Requested medication (s) are due for refill today: Potassium Chloride, yes  Requested medication (s) are on the active medication list: yes  Last refill:  11/16/19  Future visit scheduled: yes  Notes to clinic:   written per Zorita Pang, FNP    Requested Prescriptions  Pending Prescriptions Disp Refills   sacubitril-valsartan (ENTRESTO) 97-103 MG 180 tablet 3    Sig: Take 1 tablet by mouth 2 (two) times daily.      Off-Protocol Failed - 02/27/2020  1:48 PM      Failed - Medication not assigned to a protocol, review manually.      Passed - Valid encounter within last 12 months    Recent Outpatient Visits           1 month ago Aortic atherosclerosis [facility]   [facility] Steele Sizer, MD   1 year ago Chronic systolic heart failure [facility])   [facility] Steele Sizer, MD   1 year ago Chronic systolic heart failure [facility])   [facility] Steele Sizer, MD   2 years ago Hospital discharge follow-up   [facility] Steele Sizer, MD   2 years ago Cough   [facility] Steele Sizer, MD       Future Appointments             In 4 weeks  [facility], [place]   In 3 months Wellington Hampshire, MD [facility], [facility]   In 4 months Steele Sizer, MD [facility], [facility]              potassium chloride SA (KLOR-CON) 20 MEQ tablet 90 tablet 0    Sig: Take 1 tablet (20 mEq total) by mouth daily. Must make appointment for further refills      Endocrinology:  Minerals - Potassium Supplementation Passed - 02/27/2020  1:48 PM      Passed - K in normal range and within  360 days    Potassium  Date Value Ref Range Status  01/09/2020 4.4 3.5 - 5.3 mmol/L Final          Passed - Cr in normal range and within 360 days    Creat  Date Value Ref Range Status  01/09/2020 0.91 0.60 - 0.93 mg/dL Final    Comment:    For patients >10 years of age, the reference limit for Creatinine is approximately 13% higher for people identified as African-American. Renella Cunas - Valid encounter within last 12 months    Recent Outpatient Visits           1 month ago Aortic atherosclerosis [facility])   [facility] Steele Sizer, MD   1 year ago Chronic systolic heart failure [facility])   [facility] Steele Sizer, MD   1 year ago Chronic systolic heart failure [facility])   [facility] Steele Sizer, MD   2 years ago Hospital discharge follow-up   [facility] Steele Sizer, MD   2 years ago Cough   [facility] Steele Sizer, MD       Future Appointments  In 4 weeks  [facility], [place]   In 3 months Arida, Mertie Clause, MD [facility], [facility]   In 4 months Steele Sizer, MD [facility], [facility]

## 2020-03-06 NOTE — Addendum Note (Signed)
Addended by: Chilton Greathouse on: 03/06/2020 11:28 AM   Modules accepted: Orders

## 2020-03-06 NOTE — Telephone Encounter (Signed)
P called to speak with Dr. Ancil Boozer nurse/ Pt states she needs refills sent to [facility] for potassium chloride SA (KLOR-CON) 20 MEQ tablet And ENTRESTO 97-103 MG /please call Pt and advised when sent

## 2020-03-07 ENCOUNTER — Other Ambulatory Visit: Payer: Self-pay | Admitting: Family

## 2020-03-07 MED ORDER — ENTRESTO 97-103 MG PO TABS: 1.0000 | ORAL_TABLET | Freq: Two times a day (BID) | ORAL | 3 refills | Status: DC

## 2020-03-07 MED ORDER — POTASSIUM CHLORIDE CRYS ER 20 MEQ PO TBCR: 20.0000 meq | EXTENDED_RELEASE_TABLET | Freq: Every day | ORAL | 3 refills | Status: DC

## 2020-03-07 NOTE — Addendum Note (Signed)
Addended by: Chilton Greathouse on: 03/07/2020 09:15 AM   Modules accepted: Orders

## 2020-03-19 ENCOUNTER — Telehealth: Payer: Medicare Other

## 2020-03-19 ENCOUNTER — Ambulatory Visit: Payer: Self-pay

## 2020-03-19 NOTE — Chronic Care Management (AMB) (Signed)
  Chronic Care Management   Outreach Note  03/19/2020 Name: Kimberly Henry MRN: [id number] DOB: 29-Nov-1940  Primary Care Provider: Steele Sizer, MD Reason for referral : Chronic Care Management   An unsuccessful telephone outreach was attempted today. Kimberly Henry was referred to the case management team for assistance with care management and care coordination.   Unable to leave a voice message due to mailbox being full.   PLAN The care management team will reach out to Kimberly Henry again within the next two to three weeks.   [facility] Care Management [phone]

## 2020-03-27 ENCOUNTER — Ambulatory Visit: Payer: Medicare Other

## 2020-04-04 ENCOUNTER — Ambulatory Visit: Payer: Self-pay

## 2020-04-04 ENCOUNTER — Telehealth: Payer: Medicare Other

## 2020-04-04 NOTE — Chronic Care Management (AMB) (Signed)
  Chronic Care Management   Outreach Note  04/04/2020 Name: Kimberly Henry MRN: [id number] DOB: 02-09-1941  Primary Care Provider: Steele Sizer, MD Reason for referral : Chronic Care Management   A second unsuccessful telephone outreach was attempted today. Kimberly Henry was referred to the case management team for assistance with care management and care coordination.     Follow Up Plan:  The care management team will reach out to Kimberly Henry again within the next two weeks.    [facility] [phone]

## 2020-04-11 ENCOUNTER — Other Ambulatory Visit: Payer: Self-pay

## 2020-04-11 ENCOUNTER — Encounter: Payer: Self-pay | Admitting: Family

## 2020-04-11 ENCOUNTER — Ambulatory Visit: Payer: Medicare Other | Attending: Family | Admitting: Family

## 2020-04-11 VITALS — BP 155/62 | HR 56 | Resp 18 | Ht 67.0 in | Wt 259.2 lb

## 2020-04-11 DIAGNOSIS — I110 Hypertensive heart disease with heart failure: Secondary | ICD-10-CM | POA: Diagnosis not present

## 2020-04-11 DIAGNOSIS — Z7901 Long term (current) use of anticoagulants: Secondary | ICD-10-CM | POA: Diagnosis not present

## 2020-04-11 DIAGNOSIS — I5032 Chronic diastolic (congestive) heart failure: Secondary | ICD-10-CM | POA: Diagnosis not present

## 2020-04-11 DIAGNOSIS — I272 Pulmonary hypertension, unspecified: Secondary | ICD-10-CM | POA: Insufficient documentation

## 2020-04-11 DIAGNOSIS — I2721 Secondary pulmonary arterial hypertension: Secondary | ICD-10-CM | POA: Diagnosis not present

## 2020-04-11 DIAGNOSIS — Z87891 Personal history of nicotine dependence: Secondary | ICD-10-CM | POA: Diagnosis not present

## 2020-04-11 DIAGNOSIS — I4430 Unspecified atrioventricular block: Secondary | ICD-10-CM | POA: Insufficient documentation

## 2020-04-11 DIAGNOSIS — Z8249 Family history of ischemic heart disease and other diseases of the circulatory system: Secondary | ICD-10-CM | POA: Diagnosis not present

## 2020-04-11 DIAGNOSIS — E785 Hyperlipidemia, unspecified: Secondary | ICD-10-CM | POA: Insufficient documentation

## 2020-04-11 DIAGNOSIS — Z79899 Other long term (current) drug therapy: Secondary | ICD-10-CM | POA: Diagnosis not present

## 2020-04-11 DIAGNOSIS — M170 Bilateral primary osteoarthritis of knee: Secondary | ICD-10-CM | POA: Insufficient documentation

## 2020-04-11 DIAGNOSIS — I10 Essential (primary) hypertension: Secondary | ICD-10-CM

## 2020-04-11 DIAGNOSIS — Z6841 Body Mass Index (BMI) 40.0 and over, adult: Secondary | ICD-10-CM | POA: Diagnosis not present

## 2020-04-11 DIAGNOSIS — J449 Chronic obstructive pulmonary disease, unspecified: Secondary | ICD-10-CM | POA: Insufficient documentation

## 2020-04-11 DIAGNOSIS — I2510 Atherosclerotic heart disease of native coronary artery without angina pectoris: Secondary | ICD-10-CM | POA: Diagnosis not present

## 2020-04-11 DIAGNOSIS — Z8541 Personal history of malignant neoplasm of cervix uteri: Secondary | ICD-10-CM | POA: Diagnosis not present

## 2020-04-11 NOTE — Progress Notes (Signed)
Patient ID: Kimberly Henry, female    DOB: [birth year], 79 y.o.   MRN: [id number]  HPI  Kimberly Henry is a 79 y/o female with a history of AV block, COPD, hyperlipidemia, HTN, osteoarthritis, remote tobacco use and chronic heart failure.  Echo report from 01/22/20 reviewed and showed an EF of 50-55% along with mildly elevated PA pressure. Echo report from 08/26/17 reviewed and showed an EF of 30-35% along with moderate MR and normal PA pressure. Echo from 04/19/17 reviewed and shows an EF of 30-35% along with mild MR and moderate TR.   Right/Left Cardiac catheterization was done 04/21/17 and showed mild nonobstructive CAD with an EF of 25-35%. Moderately elevated filling pressure, severe pulmonary HTN and severely reduced cardiac output at 3.14 with a cardiac index of 1.38. PA pressure was 61/34 with a mean of 47 mm Hg.   Has not been admitted or been in the ED in the last 6 months.   She presents today for a follow up visit although hasn't been seen since January 2020. She presents with a chief complaint of moderate fatigue upon minimal exertion. She describes this as chronic in nature having been present for several years although has worsened since she's unable to get around very will due to right knee pain. She has associated shortness of breath and right knee pain along with this. She denies any difficulty sleeping, dizziness, abdominal distention, palpitations, pedal edema, chest pain, cough or weight gain.   Past Medical History:  Diagnosis Date  . Acid phosphatase elevated   . AV block, 1st degree   . CHF (congestive heart failure) ([place])   . COPD (chronic obstructive pulmonary disease) ([place])   . HFrEF (heart failure with reduced ejection fraction) ([place])    a. 04/2017 Echo: EF 30-35%, antsept HK, mild MR, mod dil LA, mildly dil RA, mod TR, mildly to mod elev PASP; b. 08/2017 Echo: EF 30-35%, diff HK, antsept HK. Gr1 DD. Mild to mod MR. PASP nl.  . Hx of cervical malignancy   . Hyperlipidemia    . Hypertension   . LBBB (left bundle branch block)   . Metabolic syndrome   . NICM (nonischemic cardiomyopathy) ([place])    a. 04/2017 Echo: EF 30-35%; b. 04/2017 Cath: nonobs dzs; c. 08/2017 Echo: EF 30-35%, diff HK, antsept HK. Gr1 DD. Mild to mod MR. PASP nl.  . Non-obstructive CAD (coronary artery disease)    a. 04/2017 Cath: mild nonobs CAD. EF 25-35%. CO 3.14, CI 1.38. Sev PAH [61/34(47].  . Obesity, Class III, BMI 40-49.9 (morbid obesity) ([place])   . Osteoarthritis of both knees   . PAH (pulmonary arterial hypertension) with portal hypertension ([place])    a. 04/2017 Right Heart Cath: Sev PAH 61/34(47); b. 08/2017 Echo: nl PASP.   Past Surgical History:  Procedure Laterality Date  . ABDOMINAL HYSTERECTOMY  1996  . CARDIAC CATHETERIZATION    . CATARACT EXTRACTION Left 2008  . RIGHT/LEFT HEART CATH AND CORONARY ANGIOGRAPHY N/A 04/21/2017   Procedure: RIGHT/LEFT HEART CATH AND CORONARY ANGIOGRAPHY;  Surgeon: Wellington Hampshire, MD;  Location: [facility];  Service: Cardiovascular;  Laterality: N/A;   Family History  Problem Relation Age of Onset  . Hypertension Mother   . Congestive Heart Failure Mother   . Congestive Heart Failure Father   . Prostate cancer Son    Social History   Tobacco Use  . Smoking status: Former Smoker    Years: 10.00    Types: Cigarettes  Start date: 09/14/1975    Quit date: 06/22/1986    Years since quitting: 33.8  . Smokeless tobacco: Never Used  Substance Use Topics  . Alcohol use: No    Alcohol/week: 0.0 standard drinks   No Known Allergies  Prior to Admission medications   Medication Sig Start Date End Date Taking? Authorizing Provider  acetaminophen (TYLENOL) 500 MG chewable tablet Chew 500 mg by mouth every 6 (six) hours as needed for pain.   Yes [provider]  apixaban (ELIQUIS) 5 MG TABS tablet Take 1 tablet (5 mg total) by mouth 2 (two) times daily. 01/15/20  Yes Loel Dubonnet, NP  carvedilol (COREG) 6.25 MG tablet TAKE 1  TABLET BY MOUTH TWO  TIMES DAILY WITH MEALS 04/24/19  Yes Darylene Price A, FNP  digoxin (LANOXIN) 0.125 MG tablet TAKE 1 TABLET BY MOUTH  DAILY 04/24/19  Yes Darylene Price A, FNP  furosemide (LASIX) 40 MG tablet TAKE 1 TABLET BY MOUTH  DAILY 12/01/18  Yes Darylene Price A, FNP  levocetirizine (XYZAL) 5 MG tablet Take 1 tablet (5 mg total) by mouth every evening. 01/09/20  Yes Sowles, Drue Stager, MD  potassium chloride SA (KLOR-CON) 20 MEQ tablet Take 1 tablet (20 mEq total) by mouth daily. Must make appointment for further refills Patient taking differently: Take 20 mEq by mouth daily.  03/07/20  Yes Darylene Price A, FNP  rosuvastatin (CRESTOR) 40 MG tablet Take 1 tablet (40 mg total) by mouth daily. In place of Atorvastatin 01/22/20  Yes Sowles, Drue Stager, MD  sacubitril-valsartan (ENTRESTO) 97-103 MG Take 1 tablet by mouth 2 (two) times daily. 03/07/20  Yes Darylene Price A, FNP  spironolactone (ALDACTONE) 25 MG tablet TAKE 1 TABLET BY MOUTH  DAILY 04/24/19  Yes Alisa Graff, FNP    Review of Systems  Constitutional: Positive for fatigue (tire easily). Negative for appetite change.  HENT: Negative for congestion and postnasal drip.   Eyes: Negative.   Respiratory: Positive for shortness of breath (with moderate exertion). Negative for chest tightness.   Cardiovascular: Negative for chest pain, palpitations and leg swelling.  Gastrointestinal: Negative for abdominal distention and abdominal pain.  Endocrine: Negative.   Genitourinary: Negative.   Musculoskeletal: Positive for arthralgias (right knee pain). Negative for back pain.  Skin: Negative.   Allergic/Immunologic: Negative.   Neurological: Negative for dizziness and light-headedness.  Hematological: Negative for adenopathy. Does not bruise/bleed easily.  Psychiatric/Behavioral: Negative for dysphoric mood and sleep disturbance (sleeping on 1-2 pillows). The patient is not nervous/anxious.    Vitals:   04/11/20 0945  BP: (!) 155/62  Pulse: 56   Resp: 18  SpO2: 100%  Weight: (!) 259 lb 4 oz (117.6 kg)  Height: 5\' 7"  (1.702 m)   Wt Readings from Last 3 Encounters:  04/11/20 (!) 259 lb 4 oz (117.6 kg)  02/05/20 248 lb (112.5 kg)  01/15/20 249 lb (112.9 kg)   Lab Results  Component Value Date   CREATININE 0.91 01/09/2020   CREATININE 0.83 07/18/2018   CREATININE 0.88 03/27/2018    Physical Exam Vitals and nursing note reviewed.  Constitutional:      Appearance: She is well-developed.  HENT:     Head: Normocephalic and atraumatic.  Neck:     Vascular: No JVD.  Cardiovascular:     Rate and Rhythm: Normal rate and regular rhythm.  Pulmonary:     Effort: Pulmonary effort is normal.     Breath sounds: No wheezing or rales.  Abdominal:  General: There is no distension.     Palpations: Abdomen is soft.     Tenderness: There is no abdominal tenderness.  Musculoskeletal:        General: No tenderness.     Cervical back: Normal range of motion and neck supple.  Skin:    General: Skin is warm and dry.  Neurological:     Mental Status: She is alert and oriented to person, place, and time.  Psychiatric:        Behavior: Behavior normal.        Thought Content: Thought content normal.    Assessment & Plan:  1: Chronic heart failure with now preserved ejection fraction- - NYHA class III - Euvolemic today - has not been weighing daily; encouraged her to resume weighing daily and to call for an overnight weight gain of >2 pounds or a weekly weight gain of >5 pounds - Not adding salt and has been reading food labels; using no sodium seasoning such as Mrs. Dash.  - Does wear support socks & elevates her legs; edema is intermittent - dig level on 01/09/20 was 0.7 - saw cardiology Fletcher Anon) 02/05/20  - BNP 04/19/17 was 1000.0 - reports receiving both her COVID vaccines  2: HTN- - BP mildly elevated but she says that her knee hurts quite a bit - saw PCP Ancil Boozer) 01/09/20 - BMP from 01/09/20 reviewed and showed sodium 142,  potassium 4.4, creatinine 0.91 and GFR 70   Patient did not bring her medications nor a list. Each medication was verbally reviewed with the patient and she was encouraged to bring the bottles to every visit to confirm accuracy of list.  Return in 6 months or sooner for any questions/problems before then.

## 2020-04-11 NOTE — Patient Instructions (Signed)
Continue weighing daily and call for an overnight weight gain of > 2 pounds or a weekly weight gain of >5 pounds. 

## 2020-04-16 ENCOUNTER — Ambulatory Visit (INDEPENDENT_AMBULATORY_CARE_PROVIDER_SITE_OTHER): Payer: Medicare Other

## 2020-04-16 DIAGNOSIS — I10 Essential (primary) hypertension: Secondary | ICD-10-CM

## 2020-04-16 DIAGNOSIS — E785 Hyperlipidemia, unspecified: Secondary | ICD-10-CM | POA: Diagnosis not present

## 2020-04-16 DIAGNOSIS — I5022 Chronic systolic (congestive) heart failure: Secondary | ICD-10-CM

## 2020-04-16 NOTE — Patient Instructions (Addendum)
Thank you for allowing the Chronic Care Management team to participate in your care.   Goals Addressed            This Visit's Progress   . Chronic Disease Management       CARE PLAN ENTRY (see longitudinal plan of care for additional care plan information)  Current Barriers:  . Chronic Disease Management support and education needs related to Hypertension, Dyslipidemia and CHF.  Case Manager Clinical Goal(s):  Over the next 120 days, patient will: . Not require hospitalization or emergent care d/t complications r/t CHF. Marland Kitchen Continue taking all medications as prescribed. . Attend all medical appointments as scheduled. . Weigh daily and record readings. . Monitor BP and record readings. . Continue adherence with recommended cardiac prudent/heart healthy diet. . Follow recommended safety measures to prevent falls and injuries. Over the next 45 days, patient will: . Update care management team if medication assistance is needed.  Interventions:  . Inter-disciplinary care team collaboration (see longitudinal plan of care) . Reviewed medication list. Encouraged to continue taking medications as prescribed. Advised to avoid abruptly stopping medications and notify provider if unable to tolerate prescribed regimen. Encouraged to update care management team if medication assistance is needed.  . Discussed established blood pressure parameters and indications for notifying PCP or Cardiology team. Encouraged to monitor daily and record readings. Attempts to monitor daily. Unable to recall exact BP but reports readings were within range.  . Reviewed s/sx of complications related to CHF and indications for seeking medical attention. Reviewed weight parameters and recommendations made during her recent Cardiology visit. Encouraged to continue monitoring daily and record readings. Denies increased weight or lower extremity edema. Denies episodes of chest pain, palpitations or shortness of breath.  No decline in activity tolerance.  . Provided information regarding recommended safety and fall prevention measures. Encouraged to continue using cane as needed when ambulating. Encouraged to keep pathways clear and well lit.   . Reviewed scheduled. Encouraged to attend medical appointments as scheduled to prevent delays in care. Encouraged to contact care management team if transportation assistance is needed. Attended Cardiology outreach as scheduled on 04/11/20. Will follow-up with Cardiologist on 06/12/20. Denies current concerns regarding transportation.  . Discussed plans for ongoing care management and follow up. Provided direct contact information. Reports doing very well with good family support. No urgent care management needs. The care management team will plan to outreach within the next two months.  Patient Self Care Activities:  . Self administers medications  . Attends scheduled provider appointments . Calls pharmacy for medication refills . Attends church or other social activities . Performs ADL's independently . Performs IADL's independently . Calls provider office for new concerns or questions   Initial goal documentation       Ms. Hurtado was given information about Chronic Care Management services including:  1. CCM service includes personalized support from designated clinical staff supervised by her physician, including individualized plan of care and coordination with other care providers 2. 24/7 contact phone numbers for assistance for urgent and routine care needs. 3. Service will only be billed when office clinical staff spend 20 minutes or more in a month to coordinate care. 4. Only one practitioner may furnish and bill the service in a calendar month. 5. The patient may stop CCM services at any time (effective at the end of the month) by phone call to the office staff. 6. The patient will be responsible for cost sharing (co-pay) of up to 20%  of the service fee  (after annual deductible is met).  Patient agreed to services and verbal consent obtained.    Ms. Catanese verbalized understanding of the information discussed during the telephonic outreach today. Declined need for a mailed/printed copy of the instructions.   The care management team will follow-up with Ms. Bango within the next two months.   [facility] [phone]

## 2020-04-16 NOTE — Chronic Care Management (AMB) (Signed)
Chronic Care Management   Initial Visit Note  04/16/2020 Name: Kimberly Henry MRN: [id number] DOB: 05/09/1941  Primary Care Provider: Steele Sizer, MD Reason for referral : Chronic Care Management   Kimberly Henry is a 79 y.o. year old female who is a primary care patient of Kimberly Sizer, MD. The CCM team was consulted for assistance with chronic disease management and care coordination. A telephonic assessment was conducted today.  Review of Kimberly Henry's status, including review of consultants reports, relevant labs and test results was conducted today. Collaboration with appropriate care team members was performed as part of the comprehensive evaluation and provision of chronic care management services.    SDOH (Social Determinants of Health) assessments performed: Yes See Care Plan activities for detailed interventions related to SDOH  SDOH Interventions     Most Recent Value  SDOH Interventions  Food Insecurity Interventions Intervention Not Indicated  Housing Interventions Intervention Not Indicated  Transportation Interventions Intervention Not Indicated       Medications: Outpatient Encounter Medications as of 04/16/2020  Medication Sig  . acetaminophen (TYLENOL) 500 MG chewable tablet Chew 500 mg by mouth every 6 (six) hours as needed for pain.  Marland Kitchen apixaban (ELIQUIS) 5 MG TABS tablet Take 1 tablet (5 mg total) by mouth 2 (two) times daily.  . carvedilol (COREG) 6.25 MG tablet TAKE 1 TABLET BY MOUTH TWO  TIMES DAILY WITH MEALS  . digoxin (LANOXIN) 0.125 MG tablet TAKE 1 TABLET BY MOUTH  DAILY  . furosemide (LASIX) 40 MG tablet TAKE 1 TABLET BY MOUTH  DAILY  . levocetirizine (XYZAL) 5 MG tablet Take 1 tablet (5 mg total) by mouth every evening.  . potassium chloride SA (KLOR-CON) 20 MEQ tablet Take 1 tablet (20 mEq total) by mouth daily. Must make appointment for further refills (Patient taking differently: Take 20 mEq by mouth daily. )  . rosuvastatin (CRESTOR) 40 MG  tablet Take 1 tablet (40 mg total) by mouth daily. In place of Atorvastatin  . sacubitril-valsartan (ENTRESTO) 97-103 MG Take 1 tablet by mouth 2 (two) times daily.  Marland Kitchen spironolactone (ALDACTONE) 25 MG tablet TAKE 1 TABLET BY MOUTH  DAILY   No facility-administered encounter medications on file as of 04/16/2020.     Objective:  BP Readings from Last 3 Encounters:  04/11/20 (!) 155/62  02/05/20 128/60  01/15/20 110/80   Lab Results  Component Value Date   CHOL 163 01/09/2020   HDL 40 (L) 01/09/2020   LDLCALC 104 (H) 01/09/2020   TRIG 91 01/09/2020   CHOLHDL 4.1 01/09/2020   Wt Readings from Last 3 Encounters:  04/11/20 (!) 259 lb 4 oz (117.6 kg)  02/05/20 248 lb (112.5 kg)  01/15/20 249 lb (112.9 kg)   Functional Status Survey: Is the patient deaf or have difficulty hearing?: No Does the patient have difficulty seeing, even when wearing glasses/contacts?: No Does the patient have difficulty concentrating, remembering, or making decisions?: No Does the patient have difficulty walking or climbing stairs?: No (Uses cane when using stairs or prolonged walking.) Does the patient have difficulty dressing or bathing?: No Does the patient have difficulty doing errands alone such as visiting a doctor's office or shopping?: No   Depression screen [facility] 2/9 04/16/2020 01/09/2020 02/27/2019  Decreased Interest 0 0 0  Down, Depressed, Hopeless 0 0 0  PHQ - 2 Score 0 0 0  Altered sleeping - 0 0  Tired, decreased energy - 0 0  Change in appetite - 0 0  Feeling  bad or failure about yourself  - 0 0  Trouble concentrating - 0 0  Moving slowly or fidgety/restless - 0 0  Suicidal thoughts - 0 0  PHQ-9 Score - 0 0  Difficult doing work/chores - Not difficult at all -    Fall Risk  04/16/2020 04/11/2020 01/09/2020 02/27/2019 11/07/2018  Falls in the past year? 0 0 0 0 0  Number falls in past yr: - 0 0 0 0  Injury with Fall? - 0 0 0 0  Risk for fall due to : Other (Comment) - - - -  Risk for fall due  to: Comment Hx of knee/joint pain - - - -  Follow up Falls prevention discussed Falls evaluation completed - - Falls prevention discussed    Goals Addressed            This Visit's Progress   . Chronic Disease Management       CARE PLAN ENTRY (see longitudinal plan of care for additional care plan information)  Current Barriers:  . Chronic Disease Management support and education needs related to Hypertension, Dyslipidemia and CHF.  Case Manager Clinical Goal(s):  Over the next 120 days, patient will: . Not require hospitalization or emergent care d/t complications r/t CHF. Marland Kitchen Continue taking all medications as prescribed. . Attend all medical appointments as scheduled. . Weigh daily and record readings. . Monitor BP and record readings. . Continue adherence with recommended cardiac prudent/heart healthy diet. . Follow recommended safety measures to prevent falls and injuries. Over the next 45 days, patient will: . Update care management team if medication assistance is needed.  Interventions:  . Inter-disciplinary care team collaboration (see longitudinal plan of care) . Reviewed medication list. Encouraged to continue taking medications as prescribed. Advised to avoid abruptly stopping medications and notify provider if unable to tolerate prescribed regimen. Encouraged to update care management team if medication assistance is needed.  . Discussed established blood pressure parameters and indications for notifying PCP or Cardiology team. Encouraged to monitor daily and record readings. Attempts to monitor daily. Unable to recall exact BP but reports readings were within range.  . Reviewed s/sx of complications related to CHF and indications for seeking medical attention. Reviewed weight parameters and recommendations made during her recent Cardiology visit. Encouraged to continue monitoring daily and record readings. Denies increased weight or lower extremity edema. Denies  episodes of chest pain, palpitations or shortness of breath. No decline in activity tolerance.  . Provided information regarding recommended safety and fall prevention measures. Encouraged to continue using cane as needed when ambulating. Encouraged to keep pathways clear and well lit.   . Reviewed scheduled. Encouraged to attend medical appointments as scheduled to prevent delays in care. Encouraged to contact care management team if transportation assistance is needed. Attended Cardiology outreach as scheduled on 04/11/20. Will follow-up with Cardiologist on 06/12/20. Denies current concerns regarding transportation.  . Discussed plans for ongoing care management and follow up. Provided direct contact information. Reports doing very well with good family support. No urgent care management needs. The care management team will plan to outreach within the next two months.  Patient Self Care Activities:  . Self administers medications  . Attends scheduled provider appointments . Calls pharmacy for medication refills . Attends church or other social activities . Performs ADL's independently . Performs IADL's independently . Calls provider office for new concerns or questions   Initial goal documentation        Ms. Daza was given information  about Chronic Care Management services including:  1. CCM service includes personalized support from designated clinical staff supervised by her physician, including individualized plan of care and coordination with other care providers 2. 24/7 contact phone numbers for assistance for urgent and routine care needs. 3. Service will only be billed when office clinical staff spend 20 minutes or more in a month to coordinate care. 4. Only one practitioner may furnish and bill the service in a calendar month. 5. The patient may stop CCM services at any time (effective at the end of the month) by phone call to the office staff. 6. The patient will be  responsible for cost sharing (co-pay) of up to 20% of the service fee (after annual deductible is met).  Patient agreed to services and verbal consent obtained.    PLAN The care management team will follow-up with Ms. Masin within the next two months.   [facility] [phone]

## 2020-04-29 ENCOUNTER — Other Ambulatory Visit: Payer: Self-pay | Admitting: Family

## 2020-05-19 ENCOUNTER — Other Ambulatory Visit: Payer: Self-pay | Admitting: Family

## 2020-05-19 DIAGNOSIS — I4891 Unspecified atrial fibrillation: Secondary | ICD-10-CM

## 2020-05-20 ENCOUNTER — Telehealth: Payer: Self-pay | Admitting: Cardiovascular Disease

## 2020-05-20 DIAGNOSIS — M1711 Unilateral primary osteoarthritis, right knee: Secondary | ICD-10-CM | POA: Diagnosis not present

## 2020-05-20 NOTE — Telephone Encounter (Signed)
Pharmacy please comment on anticoagulation in this patient and then I will contact her for cardiac clearance.  [name] PA-C 05/20/2020 3:52 PM

## 2020-05-20 NOTE — Telephone Encounter (Signed)
   [facility] Pre-operative Risk Assessment    HEARTCARE STAFF: - Please ensure there is not already an duplicate clearance open for this procedure. - Under Visit Info/Reason for Call, type in Other and utilize the format Clearance MM/DD/YY or Clearance TBD. Do not use dashes or single digits. - If request is for dental extraction, please clarify the # of teeth to be extracted.  Request for surgical clearance:  1. What type of surgery is being performed? Rt TKA traditional knee  2. When is this surgery scheduled? 06/18/20  3. What type of clearance is required (medical clearance vs. Pharmacy clearance to hold med vs. Both)? both  4. Are there any medications that need to be held prior to surgery and how long? Not listed, please advise if needed  5. Practice name and name of physician performing surgery? [facility] - [facility] - Dr Kurtis Bushman  6. What is the office phone number? [phone] ext [phone]   7.   What is the office fax number? [phone]  8.   Anesthesia type (None, local, MAC, general) ? Local / spinal    Ace Gins 05/20/2020, 3:43 PM  _________________________________________________________________   (provider comments below)

## 2020-05-20 NOTE — Telephone Encounter (Signed)
Patient with diagnosis of afib on Eliquis for anticoagulation.    Procedure: TKA Date of procedure: 06/18/20  CHADS2-VASc score of 6 (age x2, sex, CHF, HTN, CAD)  CrCl 47mL/min using adjusted body weight Platelet count 289K  Per office protocol, patient can hold Eliquis for 3 days prior to procedure.

## 2020-05-21 NOTE — Telephone Encounter (Signed)
   Primary Cardiologist: Kathlyn Sacramento, MD  Chart reviewed and patient contacted by phone today as part of pre-operative protocol coverage. Given past medical history and time since last visit, based on ACC/AHA guidelines, Kimberly Henry would be at acceptable risk for the planned procedure without further cardiovascular testing.   OK to hold Eliquis 3 days pre op- resume as soon as safe post op.  The patient was advised that if she develops new symptoms prior to surgery to contact our office to arrange for a follow-up visit, and she verbalized understanding.  I will route this recommendation to the requesting party via Epic fax function and remove from pre-op pool.  Please call with questions.  [name], PA-C 05/21/2020, 9:53 AM

## 2020-05-22 ENCOUNTER — Other Ambulatory Visit: Payer: Self-pay

## 2020-05-22 ENCOUNTER — Ambulatory Visit (INDEPENDENT_AMBULATORY_CARE_PROVIDER_SITE_OTHER): Payer: Medicare Other

## 2020-05-22 DIAGNOSIS — Z0000 Encounter for general adult medical examination without abnormal findings: Secondary | ICD-10-CM | POA: Diagnosis not present

## 2020-05-22 DIAGNOSIS — Z7189 Other specified counseling: Secondary | ICD-10-CM | POA: Diagnosis not present

## 2020-05-22 NOTE — Progress Notes (Signed)
Subjective:   Kimberly Henry is a 79 y.o. female who presents for Medicare Annual (Subsequent) preventive examination.  Virtual Visit via Telephone Note  I connected with  Kimberly Henry on 05/22/20 at  2:50 PM EDT by telephone and verified that I am speaking with the correct person using two identifiers.  Medicare Annual Wellness visit completed telephonically due to Covid-19 pandemic.   Location: Patient: home Provider: [facility]   I discussed the limitations, risks, security and privacy concerns of performing an evaluation and management service by telephone and the availability of in person appointments. The patient expressed understanding and agreed to proceed.  Unable to perform video visit due to video visit attempted and failed and/or patient does not have video capability.   Some vital signs may be absent or patient reported.   Clemetine Marker, LPN    Review of Systems     Cardiac Risk Factors include: advanced age (>37men, >70 women);dyslipidemia;hypertension;obesity (BMI >30kg/m2)     Objective:    There were no vitals filed for this visit. There is no height or weight on file to calculate BMI.  Advanced Directives 11/07/2018 07/11/2017 06/09/2017 05/26/2017 05/09/2017 04/28/2017 04/21/2017  Does Patient Have a Medical Advance Directive? No No No No No No No  Would patient like information on creating a medical advance directive? Yes (MAU/Ambulatory/Procedural Areas - Information given) - - - - - -    Current Medications (verified) Outpatient Encounter Medications as of 05/22/2020  Medication Sig  . acetaminophen (TYLENOL) 500 MG chewable tablet Chew 500 mg by mouth every 6 (six) hours as needed for pain.  . carvedilol (COREG) 6.25 MG tablet TAKE 1 TABLET BY MOUTH  TWICE DAILY WITH MEALS  . digoxin (LANOXIN) 0.125 MG tablet TAKE 1 TABLET BY MOUTH  DAILY  . ELIQUIS 5 MG TABS tablet TAKE 1 TABLET BY MOUTH  TWICE DAILY  . furosemide (LASIX) 40 MG tablet TAKE 1 TABLET BY MOUTH   DAILY  . levocetirizine (XYZAL) 5 MG tablet Take 1 tablet (5 mg total) by mouth every evening.  . potassium chloride SA (KLOR-CON) 20 MEQ tablet Take 1 tablet (20 mEq total) by mouth daily. Must make appointment for further refills (Patient taking differently: Take 20 mEq by mouth daily. )  . rosuvastatin (CRESTOR) 40 MG tablet Take 1 tablet (40 mg total) by mouth daily. In place of Atorvastatin  . sacubitril-valsartan (ENTRESTO) 97-103 MG Take 1 tablet by mouth 2 (two) times daily.  Marland Kitchen spironolactone (ALDACTONE) 25 MG tablet TAKE 1 TABLET BY MOUTH  DAILY   No facility-administered encounter medications on file as of 05/22/2020.    Allergies (verified) Patient has no known allergies.   History: Past Medical History:  Diagnosis Date  . Acid phosphatase elevated   . AV block, 1st degree   . CHF (congestive heart failure) ([place])   . COPD (chronic obstructive pulmonary disease) ([place])   . HFrEF (heart failure with reduced ejection fraction) ([place])    a. 04/2017 Echo: EF 30-35%, antsept HK, mild MR, mod dil LA, mildly dil RA, mod TR, mildly to mod elev PASP; b. 08/2017 Echo: EF 30-35%, diff HK, antsept HK. Gr1 DD. Mild to mod MR. PASP nl.  . Hx of cervical malignancy   . Hyperlipidemia   . Hypertension   . LBBB (left bundle branch block)   . Metabolic syndrome   . NICM (nonischemic cardiomyopathy) ([place])    a. 04/2017 Echo: EF 30-35%; b. 04/2017 Cath: nonobs dzs; c. 08/2017 Echo:  EF 30-35%, diff HK, antsept HK. Gr1 DD. Mild to mod MR. PASP nl.  . Non-obstructive CAD (coronary artery disease)    a. 04/2017 Cath: mild nonobs CAD. EF 25-35%. CO 3.14, CI 1.38. Sev PAH [61/34(47].  . Obesity, Class III, BMI 40-49.9 (morbid obesity) ([place])   . Osteoarthritis of both knees   . PAH (pulmonary arterial hypertension) with portal hypertension ([place])    a. 04/2017 Right Heart Cath: Sev PAH 61/34(47); b. 08/2017 Echo: nl PASP.   Past Surgical History:  Procedure Laterality Date  . ABDOMINAL HYSTERECTOMY  1996    . CARDIAC CATHETERIZATION    . CATARACT EXTRACTION Left 2008  . RIGHT/LEFT HEART CATH AND CORONARY ANGIOGRAPHY N/A 04/21/2017   Procedure: RIGHT/LEFT HEART CATH AND CORONARY ANGIOGRAPHY;  Surgeon: Wellington Hampshire, MD;  Location: [facility];  Service: Cardiovascular;  Laterality: N/A;   Family History  Problem Relation Age of Onset  . Hypertension Mother   . Congestive Heart Failure Mother   . Congestive Heart Failure Father   . Prostate cancer Son    Social History   Socioeconomic History  . Marital status: Widowed    Spouse name: Not on file  . Number of children: 4  . Years of education: 66  . Highest education level: 12th grade  Occupational History    Comment: part time  Tobacco Use  . Smoking status: Former Smoker    Years: 10.00    Types: Cigarettes    Start date: 09/14/1975    Quit date: 06/22/1986    Years since quitting: 33.9  . Smokeless tobacco: Never Used  Vaping Use  . Vaping Use: Never used  Substance and Sexual Activity  . Alcohol use: No    Alcohol/week: 0.0 standard drinks  . Drug use: No  . Sexual activity: Not Currently  Other Topics Concern  . Not on file  Social History Narrative   Patient has 4 grown children-1 of which is deceased, 64 grandchildren & 5 great-grandchildren   Social Determinants of Health   Financial Resource Strain: Low Risk   . Difficulty of Paying Living Expenses: Not hard at all  Food Insecurity: No Food Insecurity  . Worried About Charity fundraiser in the Last Year: Never true  . Ran Out of Food in the Last Year: Never true  Transportation Needs: No Transportation Needs  . Lack of Transportation (Medical): No  . Lack of Transportation (Non-Medical): No  Physical Activity: Inactive  . Days of Exercise per Week: 0 days  . Minutes of Exercise per Session: 0 min  Stress: No Stress Concern Present  . Feeling of Stress : Not at all  Social Connections: Moderately Isolated  . Frequency of Communication with  Friends and Family: More than three times a week  . Frequency of Social Gatherings with Friends and Family: Three times a week  . Attends Religious Services: More than 4 times per year  . Active Member of Clubs or Organizations: No  . Attends Archivist Meetings: Never  . Marital Status: Widowed    Tobacco Counseling Counseling given: Not Answered   Clinical Intake:  Pre-visit preparation completed: Yes  Pain : No/denies pain     Nutritional Risks: None Diabetes: No  How often do you need to have someone help you when you read instructions, pamphlets, or other written materials from your doctor or pharmacy?: 1 - Never    Interpreter Needed?: No  Information entered by :: Clemetine Marker LPN  Activities of Daily Living In your present state of health, do you have any difficulty performing the following activities: 05/22/2020 04/16/2020  Hearing? N N  Comment declines hearing aids -  Vision? N N  Difficulty concentrating or making decisions? N N  Walking or climbing stairs? N N  Comment - Uses cane when using stairs or prolonged walking.  Dressing or bathing? N N  Doing errands, shopping? N N  Preparing Food and eating ? N -  Using the Toilet? N -  In the past six months, have you accidently leaked urine? N -  Do you have problems with loss of bowel control? N -  Managing your Medications? N -  Managing your Finances? N -  Housekeeping or managing your Housekeeping? N -  Some recent data might be hidden    Patient Care Team: Steele Sizer, MD as PCP - General (Family Medicine) Wellington Hampshire, MD as PCP - Cardiology (Cardiology) Alisa Graff, FNP as Nurse Practitioner (Family Medicine) Wellington Hampshire, MD as Consulting Physician (Cardiology) Neldon Labella, RN as Case Manager  Indicate any recent Medical Services you may have received from other than [facility] providers in the past year (date may be approximate).     Assessment:   This is a routine  wellness examination for [facility].  Hearing/Vision screen  Hearing Screening   125Hz  250Hz  500Hz  1000Hz  2000Hz  3000Hz  4000Hz  6000Hz  8000Hz   Right ear:           Left ear:           Comments: Pt has no difficulty hearing   Vision Screening Comments: Annual vision screenings done at [facility] issues and exercise activities discussed: Current Exercise Habits: The patient does not participate in regular exercise at present, Exercise limited by: orthopedic condition(s)  Goals    . Chronic Disease Management     CARE PLAN ENTRY (see longitudinal plan of care for additional care plan information)  Current Barriers:  . Chronic Disease Management support and education needs related to Hypertension, Dyslipidemia and CHF.  Case Manager Clinical Goal(s):  Over the next 120 days, patient will: . Not require hospitalization or emergent care d/t complications r/t CHF. Marland Kitchen Continue taking all medications as prescribed. . Attend all medical appointments as scheduled. . Weigh daily and record readings. . Monitor BP and record readings. . Continue adherence with recommended cardiac prudent/heart healthy diet. . Follow recommended safety measures to prevent falls and injuries. Over the next 45 days, patient will: . Update care management team if medication assistance is needed.  Interventions:  . Inter-disciplinary care team collaboration (see longitudinal plan of care) . Reviewed medication list. Encouraged to continue taking medications as prescribed. Advised to avoid abruptly stopping medications and notify provider if unable to tolerate prescribed regimen. Encouraged to update care management team if medication assistance is needed.  . Discussed established blood pressure parameters and indications for notifying PCP or Cardiology team. Encouraged to monitor daily and record readings. Attempts to monitor daily. Unable to recall exact BP but reports readings were within range.  . Reviewed  s/sx of complications related to CHF and indications for seeking medical attention. Reviewed weight parameters and recommendations made during her recent Cardiology visit. Encouraged to continue monitoring daily and record readings. Denies increased weight or lower extremity edema. Denies episodes of chest pain, palpitations or shortness of breath. No decline in activity tolerance.  . Provided information regarding recommended safety and fall prevention measures. Encouraged to continue using cane as  needed when ambulating. Encouraged to keep pathways clear and well lit.   . Reviewed scheduled. Encouraged to attend medical appointments as scheduled to prevent delays in care. Encouraged to contact care management team if transportation assistance is needed. Attended Cardiology outreach as scheduled on 04/11/20. Will follow-up with Cardiologist on 06/12/20. Denies current concerns regarding transportation.  . Discussed plans for ongoing care management and follow up. Provided direct contact information. Reports doing very well with good family support. No urgent care management needs. The care management team will plan to outreach within the next two months.  Patient Self Care Activities:  . Self administers medications  . Attends scheduled provider appointments . Calls pharmacy for medication refills . Attends church or other social activities . Performs ADL's independently . Performs IADL's independently . Calls provider office for new concerns or questions   Initial goal documentation     . Increase physical activity     Pt would like to increase physical activity after knee replacement this year.       Depression Screen PHQ 2/9 Scores 05/22/2020 04/16/2020 01/09/2020 02/27/2019 11/07/2018 07/18/2018 03/27/2018  PHQ - 2 Score 0 0 0 0 0 0 0  PHQ- 9 Score - - 0 0 - 0 -    Fall Risk Fall Risk  05/22/2020 04/16/2020 04/11/2020 01/09/2020 02/27/2019  Falls in the past year? 0 0 0 0 0  Number falls in past  yr: 0 - 0 0 0  Injury with Fall? 0 - 0 0 0  Risk for fall due to : No Fall Risks Other (Comment) - - -  Risk for fall due to: Comment - Hx of knee/joint pain - - -  Follow up Falls prevention discussed Falls prevention discussed Falls evaluation completed - -    Any stairs in or around the home? Yes  If so, are there any without handrails? No  Home free of loose throw rugs in walkways, pet beds, electrical cords, etc? Yes  Adequate lighting in your home to reduce risk of falls? Yes   ASSISTIVE DEVICES UTILIZED TO PREVENT FALLS:  Life alert? No  Use of a cane, walker or w/c? Yes  Grab bars in the bathroom? Yes   Shower chair or bench in shower? No  Elevated toilet seat or a handicapped toilet? No   TIMED UP AND GO:  Was the test performed? No . Telephonic visit.   Cognitive Function:     6CIT Screen 05/22/2020 11/07/2018  What Year? 0 points 0 points  What month? 0 points 0 points  What time? 0 points 0 points  Count back from 20 0 points 0 points  Months in reverse 0 points 0 points  Repeat phrase 2 points 4 points  Total Score 2 4    Immunizations Immunization History  Administered Date(s) Administered  . Influenza, High Dose Seasonal PF 06/22/2016, 07/18/2018, 05/15/2019  . Influenza, Seasonal, Injecte, Preservative Fre 07/23/2014  . Influenza,inj,Quad PF,6+ Mos 05/30/2015, 07/01/2017  . Influenza-Unspecified 07/10/2013  . Moderna SARS-COVID-2 Vaccination 09/13/2019, 10/12/2019  . Pneumococcal Conjugate-13 05/30/2015  . Pneumococcal Polysaccharide-23 05/02/2012  . Tdap 05/02/2012  . Zoster 07/23/2014    TDAP status: Up to date   Flu Vaccine status: due for 2021-2022 season  Pneumococcal vaccine status: Up to date   Covid-19 vaccine status: Completed vaccines  Qualifies for Shingles Vaccine? Yes   Zostavax completed Yes   Shingrix Completed?: No.    Education has been provided regarding the importance of this vaccine. Patient has been  advised to call  insurance company to determine out of pocket expense if they have not yet received this vaccine. Advised may also receive vaccine at local pharmacy or Health Dept. Verbalized acceptance and understanding.  Screening Tests Health Maintenance  Topic Date Due  . Hepatitis C Screening  Never done  . INFLUENZA VACCINE  04/13/2020  . TETANUS/TDAP  05/02/2022  . DEXA SCAN  Completed  . COVID-19 Vaccine  Completed  . PNA vac Low Risk Adult  Completed    Health Maintenance  Health Maintenance Due  Topic Date Due  . Hepatitis C Screening  Never done  . INFLUENZA VACCINE  04/13/2020    Colorectal cancer screening: No longer required.  Mammogram status: No longer required.  Bone Density status: No longer required.  Lung Cancer Screening: (Low Dose CT Chest recommended if Age 64-80 years, 30 pack-year currently smoking OR have quit w/in 15years.) does not qualify.   Additional Screening:  Hepatitis C Screening: does qualify; postponed  Vision Screening: Recommended annual ophthalmology exams for early detection of glaucoma and other disorders of the eye. Is the patient up to date with their annual eye exam?  No  Who is the provider or what is the name of the office in which the patient attends annual eye exams? [facility]  Dental Screening: Recommended annual dental exams for proper oral hygiene  Community Resource Referral / Chronic Care Management: CRR required this visit?  Yes   CCM required this visit?  No      Plan:     I have personally reviewed and noted the following in the patient's chart:   . Medical and social history . Use of alcohol, tobacco or illicit drugs  . Current medications and supplements . Functional ability and status . Nutritional status . Physical activity . Advanced directives . List of other physicians . Hospitalizations, surgeries, and ER visits in previous 12 months . Vitals . Screenings to include cognitive, depression, and falls . Referrals  and appointments  In addition, I have reviewed and discussed with patient certain preventive protocols, quality metrics, and best practice recommendations. A written personalized care plan for preventive services as well as general preventive health recommendations were provided to patient.     Clemetine Marker, LPN   01/19/5637   Nurse Notes: patient has upcoming knee replacement surgery and in need of rx for shower chair and elevated toilet seat. Referral sent to C3 team to assist patient with getting these devices and determine if [facility] will cover them. Patient aware she may need rx from provider if they are eligible to be covered.

## 2020-05-22 NOTE — Patient Instructions (Signed)
Kimberly Henry , Thank you for taking time to come for your Medicare Wellness Visit. I appreciate your ongoing commitment to your health goals. Please review the following plan we discussed and let me know if I can assist you in the future.   Screening recommendations/referrals: Colonoscopy: no longer required  Mammogram: no longer required Bone Density: no longer required Recommended yearly ophthalmology/optometry visit for glaucoma screening and checkup Recommended yearly dental visit for hygiene and checkup  Vaccinations: Influenza vaccine: due Pneumococcal vaccine: done 05/30/15 Tdap vaccine: done 05/02/12 Shingles vaccine: Shingrix discussed. Please contact your pharmacy for coverage information.  ovid-19: done 09/13/19 & 10/12/19  Conditions/risks identified: Recommend increasing physical activity as tolerated after knee surgery   Next appointment: Follow up in one year for your annual wellness visit    Preventive Care 80 Years and Older, Female Preventive care refers to lifestyle choices and visits with your health care provider that can promote health and wellness. What does preventive care include?  A yearly physical exam. This is also called an annual well check.  Dental exams once or twice a year.  Routine eye exams. Ask your health care provider how often you should have your eyes checked.  Personal lifestyle choices, including:  Daily care of your teeth and gums.  Regular physical activity.  Eating a healthy diet.  Avoiding tobacco and drug use.  Limiting alcohol use.  Practicing safe sex.  Taking low-dose aspirin every day.  Taking vitamin and mineral supplements as recommended by your health care provider. What happens during an annual well check? The services and screenings done by your health care provider during your annual well check will depend on your age, overall health, lifestyle risk factors, and family history of disease. Counseling  Your health  care provider may ask you questions about your:  Alcohol use.  Tobacco use.  Drug use.  Emotional well-being.  Home and relationship well-being.  Sexual activity.  Eating habits.  History of falls.  Memory and ability to understand (cognition).  Work and work Statistician.  Reproductive health. Screening  You may have the following tests or measurements:  Height, weight, and BMI.  Blood pressure.  Lipid and cholesterol levels. These may be checked every 5 years, or more frequently if you are over 80 years old.  Skin check.  Lung cancer screening. You may have this screening every year starting at age 80 if you have a 30-pack-year history of smoking and currently smoke or have quit within the past 15 years.  Fecal occult blood test (FOBT) of the stool. You may have this test every year starting at age 80.  Flexible sigmoidoscopy or colonoscopy. You may have a sigmoidoscopy every 5 years or a colonoscopy every 10 years starting at age 80.  Hepatitis C blood test.  Hepatitis B blood test.  Sexually transmitted disease (STD) testing.  Diabetes screening. This is done by checking your blood sugar (glucose) after you have not eaten for a while (fasting). You may have this done every 1-3 years.  Bone density scan. This is done to screen for osteoporosis. You may have this done starting at age 80.  Mammogram. This may be done every 1-2 years. Talk to your health care provider about how often you should have regular mammograms. Talk with your health care provider about your test results, treatment options, and if necessary, the need for more tests. Vaccines  Your health care provider may recommend certain vaccines, such as:  Influenza vaccine. This is recommended every  year.  Tetanus, diphtheria, and acellular pertussis (Tdap, Td) vaccine. You may need a Td booster every 10 years.  Zoster vaccine. You may need this after age 80.  Pneumococcal 13-valent conjugate  (PCV13) vaccine. One dose is recommended after age 80.  Pneumococcal polysaccharide (PPSV23) vaccine. One dose is recommended after age 80. Talk to your health care provider about which screenings and vaccines you need and how often you need them. This information is not intended to replace advice given to you by your health care provider. Make sure you discuss any questions you have with your health care provider. Document Released: 09/26/2015 Document Revised: 05/19/2016 Document Reviewed: 07/01/2015 Elsevier Interactive Patient Education  2017 [place] Prevention in the Home Falls can cause injuries. They can happen to people of all ages. There are many things you can do to make your home safe and to help prevent falls. What can I do on the outside of my home?  Regularly fix the edges of walkways and driveways and fix any cracks.  Remove anything that might make you trip as you walk through a door, such as a raised step or threshold.  Trim any bushes or trees on the path to your home.  Use bright outdoor lighting.  Clear any walking paths of anything that might make someone trip, such as rocks or tools.  Regularly check to see if handrails are loose or broken. Make sure that both sides of any steps have handrails.  Any raised decks and porches should have guardrails on the edges.  Have any leaves, snow, or ice cleared regularly.  Use sand or salt on walking paths during winter.  Clean up any spills in your garage right away. This includes oil or grease spills. What can I do in the bathroom?  Use night lights.  Install grab bars by the toilet and in the tub and shower. Do not use towel bars as grab bars.  Use non-skid mats or decals in the tub or shower.  If you need to sit down in the shower, use a plastic, non-slip stool.  Keep the floor dry. Clean up any water that spills on the floor as soon as it happens.  Remove soap buildup in the tub or shower  regularly.  Attach bath mats securely with double-sided non-slip rug tape.  Do not have throw rugs and other things on the floor that can make you trip. What can I do in the bedroom?  Use night lights.  Make sure that you have a light by your bed that is easy to reach.  Do not use any sheets or blankets that are too big for your bed. They should not hang down onto the floor.  Have a firm chair that has side arms. You can use this for support while you get dressed.  Do not have throw rugs and other things on the floor that can make you trip. What can I do in the kitchen?  Clean up any spills right away.  Avoid walking on wet floors.  Keep items that you use a lot in easy-to-reach places.  If you need to reach something above you, use a strong step stool that has a grab bar.  Keep electrical cords out of the way.  Do not use floor polish or wax that makes floors slippery. If you must use wax, use non-skid floor wax.  Do not have throw rugs and other things on the floor that can make you trip. What can  I do with my stairs?  Do not leave any items on the stairs.  Make sure that there are handrails on both sides of the stairs and use them. Fix handrails that are broken or loose. Make sure that handrails are as long as the stairways.  Check any carpeting to make sure that it is firmly attached to the stairs. Fix any carpet that is loose or worn.  Avoid having throw rugs at the top or bottom of the stairs. If you do have throw rugs, attach them to the floor with carpet tape.  Make sure that you have a light switch at the top of the stairs and the bottom of the stairs. If you do not have them, ask someone to add them for you. What else can I do to help prevent falls?  Wear shoes that:  Do not have high heels.  Have rubber bottoms.  Are comfortable and fit you well.  Are closed at the toe. Do not wear sandals.  If you use a stepladder:  Make sure that it is fully  opened. Do not climb a closed stepladder.  Make sure that both sides of the stepladder are locked into place.  Ask someone to hold it for you, if possible.  Clearly mark and make sure that you can see:  Any grab bars or handrails.  First and last steps.  Where the edge of each step is.  Use tools that help you move around (mobility aids) if they are needed. These include:  Canes.  Walkers.  Scooters.  Crutches.  Turn on the lights when you go into a dark area. Replace any light bulbs as soon as they burn out.  Set up your furniture so you have a clear path. Avoid moving your furniture around.  If any of your floors are uneven, fix them.  If there are any pets around you, be aware of where they are.  Review your medicines with your doctor. Some medicines can make you feel dizzy. This can increase your chance of falling. Ask your doctor what other things that you can do to help prevent falls. This information is not intended to replace advice given to you by your health care provider. Make sure you discuss any questions you have with your health care provider. Document Released: 06/26/2009 Document Revised: 02/05/2016 Document Reviewed: 10/04/2014 Elsevier Interactive Patient Education  2017 [employer].

## 2020-05-26 ENCOUNTER — Telehealth: Payer: Self-pay

## 2020-05-26 NOTE — Telephone Encounter (Signed)
Copied from [place] [id number]. Topic: General - Inquiry >> May 26, 2020  1:33 PM Lennox Solders wrote: Reason for CRM:pt is calling back to let dr Ancil Boozer nurse know she would like to have bath tub chair order from [facility] on [address] also pt would like nurse to return her call

## 2020-05-27 NOTE — Telephone Encounter (Signed)
Pt decided to attach this appt to 10.29.2021 appt

## 2020-06-04 ENCOUNTER — Other Ambulatory Visit: Payer: Self-pay

## 2020-06-04 DIAGNOSIS — M170 Bilateral primary osteoarthritis of knee: Secondary | ICD-10-CM

## 2020-06-04 NOTE — Progress Notes (Signed)
DME orders

## 2020-06-09 ENCOUNTER — Other Ambulatory Visit: Payer: Self-pay | Admitting: Orthopedic Surgery

## 2020-06-09 ENCOUNTER — Other Ambulatory Visit: Payer: Self-pay

## 2020-06-09 ENCOUNTER — Encounter
Admission: RE | Admit: 2020-06-09 | Discharge: 2020-06-09 | Disposition: A | Discharge status: Home / Self Care | Payer: Medicare Other | Source: Ambulatory Visit | Attending: Orthopedic Surgery | Admitting: Orthopedic Surgery

## 2020-06-09 DIAGNOSIS — Z01812 Encounter for preprocedural laboratory examination: Secondary | ICD-10-CM | POA: Insufficient documentation

## 2020-06-09 NOTE — Pre-Procedure Instructions (Signed)
Kimberly Henry  Physician Assistant  Cardiology  Telephone Encounter  Signed  Encounter Date:  05/20/2020          Signed       Show:Clear all [x] Manual[x] Template[] Copied  Added by: [x] Erlene Quan, PA-C  [] Hover for details    Primary Cardiologist: Kathlyn Sacramento, MD  Chart reviewed and patient contacted by phone today as part of pre-operative protocol coverage. Given past medical history and time since last visit, based on ACC/AHA guidelines, Kimberly Henry would be at acceptable risk for the planned procedure without further cardiovascular testing.   OK to hold Eliquis 3 days pre op- resume as soon as safe post op.  The patient was advised that if she develops new symptoms prior to surgery to contact our office to arrange for a follow-up visit, and she verbalized understanding.  I will route this recommendation to the requesting party via Epic fax function and remove from pre-op pool.  Please call with questions.  [name], PA-C 05/21/2020, 9:53 AM           Electronically signed by Erlene Quan, PA-C at 05/21/2020 9:53 AM  Telephone on 05/20/2020   Telephone on 05/20/2020     Routing History     Detailed Report

## 2020-06-09 NOTE — Patient Instructions (Addendum)
Your procedure is scheduled on:06-18-20 WEDNESDAY Report to Day Surgery on the 2nd floor of the [facility]. To find out your arrival time, please call [phone] between 1PM - 3PM on:06-17-20 TUESDAY  REMEMBER: Instructions that are not followed completely may result in serious medical risk, up to and including death; or upon the discretion of your surgeon and anesthesiologist your surgery may need to be rescheduled.  Do not eat food after midnight the night before surgery.  No gum chewing, lozengers or hard candies.  You may however, drink CLEAR liquids up to 2 hours before you are scheduled to arrive for your surgery. Do not drink anything within 2 hours of your scheduled arrival time.  Clear liquids include: - water  - apple juice without pulp - gatorade (not RED) - black coffee or tea (Do NOT add milk or creamers to the coffee or tea) Do NOT drink anything that is not on this list.  Type 1 and Type 2 diabetics should only drink water.  In addition, your doctor has ordered for you to drink the provided  Ensure Pre-Surgery Clear Carbohydrate Drink  Gatorade G2 Drinking this carbohydrate drink up to two hours before surgery helps to reduce insulin resistance and improve patient outcomes. Please complete drinking 2 hours prior to scheduled arrival time.  TAKE THESE MEDICATIONS THE MORNING OF SURGERY WITH A SIP OF WATER: -COREG (CARVEDILOL) -DIGOXIN (LANOXIN) (take one the night before and one on the morning of surgery - helps to prevent nausea after surgery.)  Use inhalers on the day of surgery and bring to the hospital.  Stop Metformin and Janumet 2 days prior to surgery.  Take 1/2 of usual insulin dose the night before surgery and none on the morning of surgery.  Follow recommendations from Cardiologist, Pulmonologist or PCP regarding stopping Aspirin, Coumadin, Plavix, Eliquis, Pradaxa, or Pletal.  One week prior to surgery: Stop Anti-inflammatories (NSAIDS) such as  Advil, Aleve, Ibuprofen, Motrin, Naproxen, Naprosyn and Aspirin based products such as Excedrin, Goodys Powder, BC Powder. Stop ANY OVER THE COUNTER supplements until after surgery. (You may continue taking Tylenol, Vitamin D, Vitamin B, and multivitamin.)  No Alcohol for 24 hours before or after surgery.  No Smoking including e-cigarettes for 24 hours prior to surgery.  No chewable tobacco products for at least 6 hours prior to surgery.  No nicotine patches on the day of surgery.  Do not use any "recreational" drugs for at least a week prior to your surgery.  Please be advised that the combination of cocaine and anesthesia may have negative outcomes, up to and including death. If you test positive for cocaine, your surgery will be cancelled.  On the morning of surgery brush your teeth with toothpaste and water, you may rinse your mouth with mouthwash if you wish. Do not swallow any toothpaste or mouthwash.  Do not wear jewelry, make-up, hairpins, clips or nail polish.  Do not wear lotions, powders, or perfumes.   Do not shave 48 hours prior to surgery.   Contact lenses, hearing aids and dentures may not be worn into surgery.  Do not bring valuables to the hospital. [facility] is not responsible for any missing/lost belongings or valuables.   Use CHG Soap or wipes as directed on instruction sheet.  Total Shoulder Arthroplasty:  use Benzolyl Peroxide 5% Gel as directed on instruction sheet.  Fleets enema or Magnesium Citrate as directed.  Bring your C-PAP to the hospital with you in case you may have to spend  the night.   Notify your doctor if there is any change in your medical condition (cold, fever, infection).  Wear comfortable clothing (specific to your surgery type) to the hospital.  Plan for stool softeners for home use; pain medications have a tendency to cause constipation. You can also help prevent constipation by eating foods high in fiber such as fruits and  vegetables and drinking plenty of fluids as your diet allows.  After surgery, you can help prevent lung complications by doing breathing exercises.  Take deep breaths and cough every 1-2 hours. Your doctor may order a device called an Incentive Spirometer to help you take deep breaths. When coughing or sneezing, hold a pillow firmly against your incision with both hands. This is called "splinting." Doing this helps protect your incision. It also decreases belly discomfort.  If you are being admitted to the hospital overnight, leave your suitcase in the car. After surgery it may be brought to your room.  If you are being discharged the day of surgery, you will not be allowed to drive home. You will need a responsible adult (18 years or older) to drive you home and stay with you that night.   If you are taking public transportation, you will need to have a responsible adult (18 years or older) with you. Please confirm with your physician that it is acceptable to use public transportation.   Please call the [facility] at [phone] if you have any questions about these instructions.  Visitation Policy:  Patients undergoing a surgery or procedure may have one family member or support person with them as long as that person is not COVID-19 positive or experiencing its symptoms.  That person may remain in the waiting area during the procedure.  Inpatient Visitation Update:   In an effort to ensure the safety of our team members and our patients, we are implementing a change to our visitation policy:  Effective Monday, Aug. 9, at 7 a.m., inpatients will be allowed one support person.  o The support person may change daily.  o The support person must pass our screening, gel in and out, and wear a mask at all times, including in the patient's room.  o Patients must also wear a mask when staff or their support person are in the room.  o Masking is required regardless of  vaccination status.  Systemwide, no visitors 17 or younger.

## 2020-06-11 ENCOUNTER — Other Ambulatory Visit: Payer: Self-pay

## 2020-06-11 DIAGNOSIS — J3089 Other allergic rhinitis: Secondary | ICD-10-CM

## 2020-06-12 ENCOUNTER — Ambulatory Visit: Payer: Medicare Other | Admitting: Cardiovascular Disease

## 2020-06-12 MED ORDER — LEVOCETIRIZINE DIHYDROCHLORIDE 5 MG PO TABS: 5.0000 mg | ORAL_TABLET | Freq: Every evening | ORAL | 1 refills | Status: DC

## 2020-06-13 HISTORY — PX: JOINT REPLACEMENT: SHX530

## 2020-06-16 ENCOUNTER — Other Ambulatory Visit: Payer: Medicare Other

## 2020-06-17 NOTE — Progress Notes (Signed)
Patient ID: Kimberly Henry, female    DOB: June 21, 1941, 79 y.o.   MRN: [id number]  PCP: Steele Sizer, MD  Chief Complaint  Patient presents with  . Urinary Tract Infection    Subjective:   Kimberly Henry is a 79 y.o. female, presents with concerns of UTI - urinary frequency  Dysuria  This is a new problem. Associated symptoms include frequency and urgency. Pertinent negatives include no discharge, flank pain, hematuria, hesitancy, nausea, possible pregnancy, sweats or vomiting. There is no history of kidney stones, recurrent UTIs, a single kidney, urinary stasis or a urological procedure.  Urinary Frequency  This is a new problem. Episode onset: 2 days. The problem occurs every urination. The problem has been gradually improving. The quality of the pain is described as aching (just hurts a little, occurs after urination). There has been no fever. There is no history of pyelonephritis. Associated symptoms include frequency and urgency. Pertinent negatives include no discharge, flank pain, hematuria, hesitancy, nausea, possible pregnancy, sweats or vomiting. Treatments tried: cranberry juice. The treatment provided mild relief. There is no history of kidney stones, recurrent UTIs, a single kidney, urinary stasis or a urological procedure.       Patient Active Problem List   Diagnosis Date Noted  . Lung nodule seen on imaging study 01/09/2020  . NICM (nonischemic cardiomyopathy) ([place]) 01/09/2020  . Ventricular tachycardia ([place]) 07/18/2018  . Morbid obesity with BMI of 40.0-44.9, adult ([place]) 07/18/2018  . Chronic systolic heart failure ([place]) 04/28/2017  . Hypokalemia 04/19/2017  . Elevated troponin 04/19/2017  . Hyperglycemia 04/19/2017  . Aortic atherosclerosis ([place]) 03/10/2017  . Anemia, unspecified 03/08/2017  . Cardiomegaly 03/04/2017  . Perennial allergic rhinitis with seasonal variation 05/30/2015  . 1st degree AV block 03/25/2015  . Morbid (severe) obesity due to excess  calories ([place]) 03/25/2015  . Metabolic syndrome 82/50/5397  . History of cervical cancer 03/03/2015  . Hypertension, benign 03/03/2015  . Osteoarthritis, knee 03/03/2015  . Dyslipidemia 03/03/2015      Current Outpatient Medications:  .  acetaminophen (TYLENOL) 500 MG tablet, Take 1,000 mg by mouth every 6 (six) hours as needed for pain. , Disp: , Rfl:  .  carvedilol (COREG) 6.25 MG tablet, TAKE 1 TABLET BY MOUTH  TWICE DAILY WITH MEALS (Patient taking differently: Take 6.25 mg by mouth 2 (two) times daily with a meal. ), Disp: 180 tablet, Rfl: 3 .  digoxin (LANOXIN) 0.125 MG tablet, TAKE 1 TABLET BY MOUTH  DAILY (Patient taking differently: Take 0.125 mg by mouth daily. ), Disp: 90 tablet, Rfl: 3 .  ELIQUIS 5 MG TABS tablet, TAKE 1 TABLET BY MOUTH  TWICE DAILY (Patient taking differently: Take 5 mg by mouth 2 (two) times daily. ), Disp: 180 tablet, Rfl: 3 .  furosemide (LASIX) 40 MG tablet, TAKE 1 TABLET BY MOUTH  DAILY (Patient taking differently: 40 mg daily. ), Disp: 90 tablet, Rfl: 3 .  levocetirizine (XYZAL) 5 MG tablet, Take 1 tablet (5 mg total) by mouth every evening., Disp: 90 tablet, Rfl: 1 .  potassium chloride SA (KLOR-CON) 20 MEQ tablet, Take 1 tablet (20 mEq total) by mouth daily. Must make appointment for further refills (Patient taking differently: Take 20 mEq by mouth daily. ), Disp: 90 tablet, Rfl: 3 .  rosuvastatin (CRESTOR) 40 MG tablet, Take 1 tablet (40 mg total) by mouth daily. In place of Atorvastatin (Patient taking differently: Take 40 mg by mouth daily. ), Disp: 90 tablet, Rfl: 3 .  sacubitril-valsartan (ENTRESTO) 97-103 MG, Take 1 tablet by mouth 2 (two) times daily., Disp: 180 tablet, Rfl: 3 .  spironolactone (ALDACTONE) 25 MG tablet, TAKE 1 TABLET BY MOUTH  DAILY (Patient taking differently: Take by mouth daily. ), Disp: 90 tablet, Rfl: 3   No Known Allergies   Social History   Tobacco Use  . Smoking status: Former Smoker    Years: 10.00    Types:  Cigarettes    Start date: 09/14/1975    Quit date: 06/22/1986    Years since quitting: 34.0  . Smokeless tobacco: Never Used  Vaping Use  . Vaping Use: Never used  Substance Use Topics  . Alcohol use: No    Alcohol/week: 0.0 standard drinks  . Drug use: No      Chart Review Today: I personally reviewed active problem list, medication list, allergies, family history, social history, health maintenance, notes from last encounter, lab results, imaging with the patient/caregiver today.   Review of Systems  Gastrointestinal: Negative for nausea and vomiting.  Genitourinary: Positive for dysuria, frequency and urgency. Negative for flank pain, hematuria and hesitancy.  10 Systems reviewed and are negative for acute change except as noted in the HPI.      Objective:   Vitals:   06/18/20 0927  BP: 130/82  Pulse: 72  Resp: 14  Temp: 98.1 F (36.7 C)  SpO2: 96%  Weight: 258 lb 9.6 oz (117.3 kg)  Height: 5\' 7"  (1.702 m)    Body mass index is 40.5 kg/m.  Physical Exam Vitals and nursing note reviewed.  Constitutional:      General: She is not in acute distress.    Appearance: Normal appearance. She is obese. She is not ill-appearing, toxic-appearing or diaphoretic.  HENT:     Head: Normocephalic and atraumatic.  Eyes:     General:        Right eye: No discharge.        Left eye: No discharge.     Conjunctiva/sclera: Conjunctivae normal.  Cardiovascular:     Rate and Rhythm: Normal rate and regular rhythm.     Pulses: Normal pulses.     Heart sounds: Normal heart sounds.  Pulmonary:     Effort: Pulmonary effort is normal. No respiratory distress.     Breath sounds: Normal breath sounds. No stridor. No wheezing, rhonchi or rales.  Abdominal:     General: Bowel sounds are normal. There is no distension.     Palpations: Abdomen is soft.     Tenderness: There is no abdominal tenderness. There is no right CVA tenderness, left CVA tenderness or guarding.  Neurological:      Mental Status: She is alert.  Psychiatric:        Mood and Affect: Mood normal.        Behavior: Behavior normal.      Results for orders placed or performed in visit on 06/18/20  POCT urinalysis dipstick  Result Value Ref Range   Color, UA yellow    Clarity, UA clear    Glucose, UA Negative Negative   Bilirubin, UA neg    Ketones, UA neg    Spec Grav, UA 1.010 1.010 - 1.025   Blood, UA 3+    pH, UA 8.0 5.0 - 8.0   Protein, UA Positive (A) Negative   Urobilinogen, UA 0.2 0.2 or 1.0 E.U./dL   Nitrite, UA neg    Leukocytes, UA Large (3+) (A) Negative   Appearance clear    Odor  normal        Assessment & Plan:   1. Dysuria - suspected UTI/cystitis with hematuria - POCT urinalysis dipstick - Urine Culture  Patient reports symptoms for the past 2 to 3 days have gradually improved but not resolved with pushing fluids and drinking cranberry juice.  Urine dip here today is positive for blood leukocytes and protein will send for culture And treat for presumed UTI with broad-spectrum antibiotics -Keflex sent to pharmacy She reports history of secondary yeast infections when taking antibiotics so Diflucan x2 was also sent and to use as needed  Patient is well-appearing, vital signs stable, no constitutional symptoms, good appetite, no abdominal or CVA tenderness on exam -suspect simple UTI  Encourage patient to continue to push fluids and follow-up as needed -explained that we would be calling in a few days with culture results    Delsa Grana, PA-C 06/18/20 9:59 AM

## 2020-06-18 ENCOUNTER — Encounter: Admission: RE | Payer: Self-pay | Source: Home / Self Care

## 2020-06-18 ENCOUNTER — Encounter: Payer: Self-pay | Admitting: Family Medicine

## 2020-06-18 ENCOUNTER — Other Ambulatory Visit: Payer: Self-pay

## 2020-06-18 ENCOUNTER — Ambulatory Visit (INDEPENDENT_AMBULATORY_CARE_PROVIDER_SITE_OTHER): Payer: Medicare Other | Admitting: Family Medicine

## 2020-06-18 ENCOUNTER — Ambulatory Visit: Admission: RE | Admit: 2020-06-18 | Payer: Medicare Other | Source: Home / Self Care | Admitting: Orthopedic Surgery

## 2020-06-18 VITALS — BP 130/82 | HR 72 | Temp 98.1°F | Resp 14 | Ht 67.0 in | Wt 258.6 lb

## 2020-06-18 DIAGNOSIS — R300 Dysuria: Secondary | ICD-10-CM

## 2020-06-18 LAB — POCT URINALYSIS DIPSTICK
Bilirubin, UA: NEGATIVE
Glucose, UA: NEGATIVE
Ketones, UA: NEGATIVE
Nitrite, UA: NEGATIVE
Odor: NORMAL
Protein, UA: POSITIVE — AB
Spec Grav, UA: 1.01 (ref 1.010–1.025)
Urobilinogen, UA: 0.2 E.U./dL
pH, UA: 8 (ref 5.0–8.0)

## 2020-06-18 SURGERY — ARTHROPLASTY, KNEE, TOTAL
Anesthesia: Spinal | Site: Knee | Laterality: Right

## 2020-06-18 MED ORDER — CEPHALEXIN 500 MG PO CAPS: 500.0000 mg | ORAL_CAPSULE | Freq: Three times a day (TID) | ORAL | 0 refills | Status: AC

## 2020-06-18 MED ORDER — FLUCONAZOLE 150 MG PO TABS: 150.0000 mg | ORAL_TABLET | ORAL | 0 refills | Status: DC | PRN

## 2020-06-18 NOTE — Patient Instructions (Signed)
Keep pushing fluids - take the antibiotic We will call you in 3 to 5 days to let you know the urine culture results  You can take the dose of diflucan if you develop yeast infection symptoms    Urinary Tract Infection, Adult A urinary tract infection (UTI) is an infection of any part of the urinary tract. The urinary tract includes:  The kidneys.  The ureters.  The bladder.  The urethra. These organs make, store, and get rid of pee (urine) in the body. What are the causes? This is caused by germs (bacteria) in your genital area. These germs grow and cause swelling (inflammation) of your urinary tract. What increases the risk? You are more likely to develop this condition if:  You have a small, thin tube (catheter) to drain pee.  You cannot control when you pee or poop (incontinence).  You are female, and: ? You use these methods to prevent pregnancy:  A medicine that kills sperm (spermicide).  A device that blocks sperm (diaphragm). ? You have low levels of a female hormone (estrogen). ? You are pregnant.  You have genes that add to your risk.  You are sexually active.  You take antibiotic medicines.  You have trouble peeing because of: ? A prostate that is bigger than normal, if you are female. ? A blockage in the part of your body that drains pee from the bladder (urethra). ? A kidney stone. ? A nerve condition that affects your bladder (neurogenic bladder). ? Not getting enough to drink. ? Not peeing often enough.  You have other conditions, such as: ? Diabetes. ? A weak disease-fighting system (immune system). ? Sickle cell disease. ? Gout. ? Injury of the spine. What are the signs or symptoms? Symptoms of this condition include:  Needing to pee right away (urgently).  Peeing often.  Peeing small amounts often.  Pain or burning when peeing.  Blood in the pee.  Pee that smells bad or not like normal.  Trouble peeing.  Pee that is  cloudy.  Fluid coming from the vagina, if you are female.  Pain in the belly or lower back. Other symptoms include:  Throwing up (vomiting).  No urge to eat.  Feeling mixed up (confused).  Being tired and grouchy (irritable).  A fever.  Watery poop (diarrhea). How is this treated? This condition may be treated with:  Antibiotic medicine.  Other medicines.  Drinking enough water. Follow these instructions at home:  Medicines  Take over-the-counter and prescription medicines only as told by your doctor.  If you were prescribed an antibiotic medicine, take it as told by your doctor. Do not stop taking it even if you start to feel better. General instructions  Make sure you: ? Pee until your bladder is empty. ? Do not hold pee for a long time. ? Empty your bladder after sex. ? Wipe from front to back after pooping if you are a female. Use each tissue one time when you wipe.  Drink enough fluid to keep your pee pale yellow.  Keep all follow-up visits as told by your doctor. This is important. Contact a doctor if:  You do not get better after 1-2 days.  Your symptoms go away and then come back. Get help right away if:  You have very bad back pain.  You have very bad pain in your lower belly.  You have a fever.  You are sick to your stomach (nauseous).  You are throwing up. Summary  A urinary tract infection (UTI) is an infection of any part of the urinary tract.  This condition is caused by germs in your genital area.  There are many risk factors for a UTI. These include having a small, thin tube to drain pee and not being able to control when you pee or poop.  Treatment includes antibiotic medicines for germs.  Drink enough fluid to keep your pee pale yellow. This information is not intended to replace advice given to you by your health care provider. Make sure you discuss any questions you have with your health care provider. Document Revised:  08/17/2018 Document Reviewed: 03/09/2018 Elsevier Patient Education  2020 [employer].

## 2020-06-20 ENCOUNTER — Encounter
Admission: RE | Admit: 2020-06-20 | Discharge: 2020-06-20 | Disposition: A | Discharge status: Home / Self Care | Payer: Medicare Other | Source: Ambulatory Visit | Attending: Orthopedic Surgery | Admitting: Orthopedic Surgery

## 2020-06-20 ENCOUNTER — Other Ambulatory Visit: Payer: Self-pay

## 2020-06-20 DIAGNOSIS — Z01812 Encounter for preprocedural laboratory examination: Secondary | ICD-10-CM | POA: Diagnosis not present

## 2020-06-20 LAB — CBC
HCT: 34.6 % — ABNORMAL LOW (ref 36.0–46.0)
Hemoglobin: 11.4 g/dL — ABNORMAL LOW (ref 12.0–15.0)
MCH: 31.6 pg (ref 26.0–34.0)
MCHC: 32.9 g/dL (ref 30.0–36.0)
MCV: 95.8 fL (ref 80.0–100.0)
Platelets: 282 10*3/uL (ref 150–400)
RBC: 3.61 MIL/uL — ABNORMAL LOW (ref 3.87–5.11)
RDW: 12.1 % (ref 11.5–15.5)
WBC: 4.5 10*3/uL (ref 4.0–10.5)
nRBC: 0 % (ref 0.0–0.2)

## 2020-06-20 LAB — URINALYSIS, ROUTINE W REFLEX MICROSCOPIC
Bilirubin Urine: NEGATIVE
Glucose, UA: NEGATIVE mg/dL
Hgb urine dipstick: NEGATIVE
Ketones, ur: NEGATIVE mg/dL
Leukocytes,Ua: NEGATIVE
Nitrite: NEGATIVE
Protein, ur: NEGATIVE mg/dL
Specific Gravity, Urine: 1.009 (ref 1.005–1.030)
pH: 6 (ref 5.0–8.0)

## 2020-06-20 LAB — BASIC METABOLIC PANEL
Anion gap: 11 (ref 5–15)
BUN: 13 mg/dL (ref 8–23)
CO2: 26 mmol/L (ref 22–32)
Calcium: 9.6 mg/dL (ref 8.9–10.3)
Chloride: 105 mmol/L (ref 98–111)
Creatinine, Ser: 0.77 mg/dL (ref 0.44–1.00)
GFR, Estimated: >60 mL/min (ref >60)
Glucose, Bld: 91 mg/dL (ref 70–99)
Potassium: 4.5 mmol/L (ref 3.5–5.1)
Sodium: 142 mmol/L (ref 135–145)

## 2020-06-20 LAB — TYPE AND SCREEN
ABO/RH(D): B POS
Antibody Screen: NEGATIVE

## 2020-06-20 LAB — SURGICAL PCR SCREEN
MRSA, PCR: NEGATIVE
Staphylococcus aureus: NEGATIVE

## 2020-06-20 LAB — PROTIME-INR
INR: 1.4 — ABNORMAL HIGH (ref 0.8–1.2)
Prothrombin Time: 16.5 seconds — ABNORMAL HIGH (ref 11.4–15.2)

## 2020-06-20 LAB — APTT: aPTT: 36 seconds (ref 24–36)

## 2020-06-20 NOTE — Patient Instructions (Signed)
Your procedure is scheduled on: Wed. 10/20 Report to Day Surgery. To find out your arrival time please call [phone] between 1PM - 3PM on Tues 10/19.  Remember: Instructions that are not followed completely may result in serious medical risk,  up to and including death, or upon the discretion of your surgeon and anesthesiologist your  surgery may need to be rescheduled.     _X__ 1. Do not eat food after midnight the night before your procedure.                 No chewing gum or hard candies. You may drink clear liquids up to 2 hours                 before you are scheduled to arrive for your surgery- DO not drink clear                 liquids within 2 hours of the start of your surgery.                 Clear Liquids include:  water, apple juice without pulp, clear Gatorade, G2 or                  Gatorade Zero (avoid Red/Purple/Blue), Black Coffee or Tea (Do not add                 anything to coffee or tea). _____2.   Complete the "Ensure Clear Pre-surgery Clear Carbohydrate Drink" provided to you, 2 hours before arrival. **If you       are diabetic you will be provided with an alternative drink, Gatorade Zero or G2.  __X__2.  On the morning of surgery brush your teeth with toothpaste and water, you                may rinse your mouth with mouthwash if you wish.  Do not swallow any toothpaste of mouthwash.     ___ 3.  No Alcohol for 24 hours before or after surgery.   ___ 4.  Do Not Smoke or use e-cigarettes For 24 Hours Prior to Your Surgery.                 Do not use any chewable tobacco products for at least 6 hours prior to                 Surgery.  ___  5.  Do not use any recreational drugs (marijuana, cocaine, heroin, ecstasy, MDMA or other)                For at least one week prior to your surgery.  Combination of these drugs with anesthesia                May have life threatening results.  ____  6.  Bring all medications with you on the day of  surgery if instructed.   __x__  7.  Notify your doctor if there is any change in your medical condition      (cold, fever, infections).     Do not wear jewelry, make-up, hairpins, clips or nail polish. Do not wear lotions, powders, or perfumes. You may wear deodorant. Do not shave 48 hours prior to surgery. Do not bring valuables to the hospital.    [facility] is not responsible for any belongings or valuables.  Contacts, dentures or bridgework may not be worn into surgery. Leave your suitcase in the car. After surgery  it may be brought to your room. For patients admitted to the hospital, discharge time is determined by your treatment team.   Patients discharged the day of surgery will not be allowed to drive home.   Make arrangements for someone to be with you for the first 24 hours of your Same Day Discharge.    Please read over the following fact sheets that you were given:    _x___ Take these medicines the morning of surgery with A SIP OF WATER:    1. acetaminophen (TYLENOL) 500 MG tablet if needed  2. carvedilol (COREG) 6.25 MG tablet  3. digoxin (LANOXIN) 0.125 MG tablet  4.  5.  6.nospironolactone (ALDACTONE) 25 MG tablet, No furosemide (LASIX) 40 MG  , No sacubitril-valsartan (ENTRESTO) 97-103 MG AM of surgery  ____ Fleet Enema (as directed)   _x___ Use CHG Soap (or wipes) as directed  ____ Use Benzoyl Peroxide Gel as instructed  ____ Use inhalers on the day of surgery  ____ Stop metformin 2 days prior to surgery    ____ Take 1/2 of usual insulin dose the night before surgery. No insulin the morning          of surgery.   __x__ Stop ELIQUIS 5 MG TABS tablet Last dose on Sat. 10/16  ____ Stop Anti-inflammatories on    __x__ Stop supplements until after surgery.  [employer]  ____ Bring C-Pap to the hospital.    If you have any questions regarding your pre-procedure instructions,  Please call Pre-admit Testing at [phone] [facility]  Probiotic if you  are on antibiotics Stool softener twice daily after surgery

## 2020-06-24 ENCOUNTER — Ambulatory Visit: Payer: Medicare Other | Admitting: Cardiovascular Disease

## 2020-06-24 ENCOUNTER — Encounter: Payer: Self-pay | Admitting: Orthopedic Surgery

## 2020-06-24 ENCOUNTER — Other Ambulatory Visit: Payer: Self-pay

## 2020-06-24 ENCOUNTER — Encounter: Payer: Self-pay | Admitting: Cardiovascular Disease

## 2020-06-24 VITALS — BP 140/66 | HR 58 | Ht 67.0 in | Wt 254.0 lb

## 2020-06-24 DIAGNOSIS — I4891 Unspecified atrial fibrillation: Secondary | ICD-10-CM | POA: Diagnosis not present

## 2020-06-24 DIAGNOSIS — I10 Essential (primary) hypertension: Secondary | ICD-10-CM | POA: Diagnosis not present

## 2020-06-24 DIAGNOSIS — E785 Hyperlipidemia, unspecified: Secondary | ICD-10-CM

## 2020-06-24 DIAGNOSIS — M25661 Stiffness of right knee, not elsewhere classified: Secondary | ICD-10-CM | POA: Diagnosis not present

## 2020-06-24 DIAGNOSIS — Z713 Dietary counseling and surveillance: Secondary | ICD-10-CM | POA: Insufficient documentation

## 2020-06-24 DIAGNOSIS — M25561 Pain in right knee: Secondary | ICD-10-CM | POA: Diagnosis not present

## 2020-06-24 DIAGNOSIS — I5022 Chronic systolic (congestive) heart failure: Secondary | ICD-10-CM | POA: Diagnosis not present

## 2020-06-24 NOTE — Patient Instructions (Signed)
Medication Instructions:  Your physician recommends that you continue on your current medications as directed. Please refer to the Current Medication list given to you today.  *If you need a refill on your cardiac medications before your next appointment, please call your pharmacy*   Lab Work: None ordered  If you have labs (blood work) drawn today and your tests are completely normal, you will receive your results only by: MyChart Message (if you have MyChart) OR A paper copy in the mail If you have any lab test that is abnormal or we need to change your treatment, we will call you to review the results.   Testing/Procedures: None ordered   Follow-Up: At [facility], you and your health needs are our priority.  As part of our continuing mission to provide you with exceptional heart care, we have created designated Provider Care Teams.  These Care Teams include your primary Cardiologist (physician) and Advanced Practice Providers (APPs -  Physician Assistants and Nurse Practitioners) who all work together to provide you with the care you need, when you need it.  We recommend signing up for the patient portal called "MyChart".  Sign up information is provided on this After Visit Summary.  MyChart is used to connect with patients for Virtual Visits (Telemedicine).  Patients are able to view lab/test results, encounter notes, upcoming appointments, etc.  Non-urgent messages can be sent to your provider as well.   To learn more about what you can do with MyChart, go to [email or web address].    Your next appointment:   4 month(s)  The format for your next appointment:   In Person  Provider:   You may see Muhammad Arida, MD or one of the following Advanced Practice Providers on your designated Care Team:   Christopher Berge, NP Ryan Dunn, PA-C Jacquelyn Visser, PA-C Cadence Furth, PA-C   Other Instructions N/A  

## 2020-06-24 NOTE — Progress Notes (Signed)
[facility] Perioperative Services  Pre-Admission/Anesthesia Testing Clinical Review  Date: 06/24/20  Patient Demographics:  Name: Kimberly Henry DOB:   1940/10/22 MRN:   [id number]  Planned Surgical Procedure(s):    Case: [id number] Date/Time: 07/02/20 0815   Procedure: TOTAL KNEE ARTHROPLASTY (Right Knee)   Anesthesia type: Spinal   Pre-op diagnosis: M17.11 Unilateral primary osteoarthritis, right knee   Location: [facility] OR ROOM 02 / [place] ORS FOR ANESTHESIA GROUP   Surgeons: Lovell Sheehan, MD     NOTE: Available PAT nursing documentation and vital signs have been reviewed. Clinical nursing staff has updated patient's PMH/PSHx, current medication list, and drug allergies/intolerances to ensure comprehensive history available to assist in medical decision making as it pertains to the aforementioned surgical procedure and anticipated anesthetic course.   Clinical Discussion:  Kimberly Henry is a 79 y.o. female who is submitted for pre-surgical anesthesia review and clearance prior to her undergoing the above procedure. Patient is a Former Smoker (10 pack years; quit 06/1986). Pertinent PMH includes: CAD, A. fib, HFrEF (NYHA class II), NSTEMI, first-degree AV block, LBBB, PAH, cardiomegaly, aortic atherosclerosis, HTN, HLD, metabolic syndrome, anemia, OA, history of cervical cancer.  Patient is followed by cardiology Fletcher Anon, MD). She was last seen in the cardiology clinic on 02/05/2020; notes reviewed.  At the time of the clinic visit, patient was doing well from cardiovascular standpoint.  She denied any chest pain, shortness of breath, orthopnea, PND, palpitations, peripheral edema, vertiginous symptoms, or presyncope/syncope.  Patient diagnosed with heart failure in 04/2017, at which time she was also noted to have an LBBB.  Cardiac catheterization at that time revealed mild nonobstructive CAD with reduced LV systolic function; LVEF 49-82%.  Right heart catheterization  with significantly elevated filling pressures, severely reduced cardiac output, and severe pulmonary hypertension.  Patient was started on sacubitril-valsartan daily.  Patient seen in early 01/2020 at which time she was noted to be in asymptomatic atrial fibrillation.  TTE at that time revealed an LVEF of 50-55%, with a severely dilated left atrium.  Patient was started on daily apixaban, which she has tolerated without issues.  Blood pressure and HFrEF additionally being managed with carvedilol, digoxin, furosemide, and spironolactone.  Patient is on GDMT for her HLD using simvastatin.  Patient scheduled to follow-up with outpatient cardiology in 4 months.  Patient is also followed in the [facility] heart failure clinic by [facility], FNP-C.  Patient was last seen in clinic on 04/11/2020; notes reviewed.  At the time of her clinic visit, patient had not been seen since 09/2018.  She presented with chief complaint of moderate fatigue with minimal exertion.  Symptoms have been chronic for years, however patient noted exacerbation with immobility related to knee pain.  Patient had associated shortness of breath, however no PND, orthopnea, dizziness, abdominal distention, palpitations, peripheral edema, angina, cough, or significant weight gain.  Discussed recommendations to control heart failure symptoms at home, including dietary modifications, activity, and lower extremity elevation.  Patient scheduled follow-up with heart failure clinic in about 6 months or sooner for any exacerbation.  Patient is scheduled to undergo an elective total knee arthroplasty on 07/02/2020 with Dr. Kurtis Bushman.  Given patient's past medical history and current cardiac issues, presurgical cardiac clearance was sought by the attending surgeon's team.  Per cardiology, "given past medical history and time since last visit, based on ACC/AHA guidelines, Kimberly Henry would be at acceptable risk for the planned procedure without further  cardiovascular testing". Again, this patient  is on daily anticoagulation therapy. She has been instructed on recommendations for holding her apixaban for 3 days prior to her procedure. The patient has been instructed that her last dose of her anticoagulant will be on 06/28/2020.  She denies previous perioperative complications with anesthesia.  Vitals with BMI 06/20/2020 06/18/2020 04/11/2020  Height 5\' 7"  5\' 7"  5\' 7"   Weight 258 lbs 258 lbs 10 oz 259 lbs 4 oz  BMI 40.4 01.09 32.35  Systolic - 573 220  Diastolic - 82 62  Pulse - 72 56    Providers/Specialists:   NOTE: Primary physician provider listed below. Patient may have been seen by APP or partner within same practice.   PROVIDER ROLE LAST Jayme Cloud, MD Orthopedics (Surgeon) 05/20/2020  Steele Sizer, MD Primary Care Provider 01/09/2020  Kathlyn Sacramento, MD Cardiology 02/05/2020  Darylene Price, FNP-C Heart Failure 04/11/2020   Allergies:  Patient has no known allergies.  Current Home Medications:   No current facility-administered medications for this encounter.   Marland Kitchen acetaminophen (TYLENOL) 500 MG tablet  . carvedilol (COREG) 6.25 MG tablet  . digoxin (LANOXIN) 0.125 MG tablet  . ELIQUIS 5 MG TABS tablet  . fluconazole (DIFLUCAN) 150 MG tablet  . furosemide (LASIX) 40 MG tablet  . levocetirizine (XYZAL) 5 MG tablet  . potassium chloride SA (KLOR-CON) 20 MEQ tablet  . rosuvastatin (CRESTOR) 40 MG tablet  . sacubitril-valsartan (ENTRESTO) 97-103 MG  . spironolactone (ALDACTONE) 25 MG tablet   History:   Past Medical History:  Diagnosis Date  . Acid phosphatase elevated   . AV block, 1st degree   . CHF (congestive heart failure) ([place])   . COPD (chronic obstructive pulmonary disease) ([place])   . HFrEF (heart failure with reduced ejection fraction) ([place])    a. 04/2017 Echo: EF 30-35%, antsept HK, mild MR, mod dil LA, mildly dil RA, mod TR, mildly to mod elev PASP; b. 08/2017 Echo: EF 30-35%, diff HK, antsept  HK. Gr1 DD. Mild to mod MR. PASP nl.  . Hx of cervical malignancy   . Hyperlipidemia   . Hypertension   . LBBB (left bundle branch block)   . Metabolic syndrome   . NICM (nonischemic cardiomyopathy) ([place])    a. 04/2017 Echo: EF 30-35%; b. 04/2017 Cath: nonobs dzs; c. 08/2017 Echo: EF 30-35%, diff HK, antsept HK. Gr1 DD. Mild to mod MR. PASP nl.  . Non-obstructive CAD (coronary artery disease)    a. 04/2017 Cath: mild nonobs CAD. EF 25-35%. CO 3.14, CI 1.38. Sev PAH [61/34(47].  . Obesity, Class III, BMI 40-49.9 (morbid obesity) ([place])   . Osteoarthritis of both knees   . PAH (pulmonary arterial hypertension) with portal hypertension ([place])    a. 04/2017 Right Heart Cath: Sev PAH 61/34(47); b. 08/2017 Echo: nl PASP.   Past Surgical History:  Procedure Laterality Date  . ABDOMINAL HYSTERECTOMY  1996  . CARDIAC CATHETERIZATION    . CATARACT EXTRACTION Left 2008  . RIGHT/LEFT HEART CATH AND CORONARY ANGIOGRAPHY N/A 04/21/2017   Procedure: RIGHT/LEFT HEART CATH AND CORONARY ANGIOGRAPHY;  Surgeon: Wellington Hampshire, MD;  Location: [facility];  Service: Cardiovascular;  Laterality: N/A;   Family History  Problem Relation Age of Onset  . Hypertension Mother   . Congestive Heart Failure Mother   . Congestive Heart Failure Father   . Prostate cancer Son    Social History   Tobacco Use  . Smoking status: Former Smoker    Years: 10.00  Types: Cigarettes    Start date: 09/14/1975    Quit date: 06/22/1986    Years since quitting: 34.0  . Smokeless tobacco: Never Used  Vaping Use  . Vaping Use: Never used  Substance Use Topics  . Alcohol use: No    Alcohol/week: 0.0 standard drinks  . Drug use: No    Pertinent Clinical Results:  LABS: Labs reviewed: Acceptable for surgery.  No visits with results within 3 Day(s) from this visit.  Latest known visit with results is:  Hospital Outpatient Visit on 06/20/2020  Component Date Value Ref Range Status  . MRSA, PCR 06/20/2020  NEGATIVE  NEGATIVE Final  . Staphylococcus aureus 06/20/2020 NEGATIVE  NEGATIVE Final   Comment: (NOTE) The Xpert SA Assay (FDA approved for NASAL specimens in patients 13 years of age and older), is one component of a comprehensive surveillance program. It is not intended to diagnose infection nor to guide or monitor treatment. Performed at [facility], [address], [place], [place] [address]   . ABO/RH(D) 06/20/2020 B POS   Final  . Antibody Screen 06/20/2020 NEG   Final  . Sample Expiration 06/20/2020 07/04/2020,2359   Final  . Extend sample reason 06/20/2020    Final                   Value:NO TRANSFUSIONS OR PREGNANCY IN THE PAST 3 MONTHS Performed at [facility], [address] [place], [place] [address]   . aPTT 06/20/2020 36  24 - 36 seconds Final   Performed at [facility], [address] [place], [place] [address]  . Sodium 06/20/2020 142  135 - 145 mmol/L Final  . Potassium 06/20/2020 4.5  3.5 - 5.1 mmol/L Final  . Chloride 06/20/2020 105  98 - 111 mmol/L Final  . CO2 06/20/2020 26  22 - 32 mmol/L Final  . Glucose, Bld 06/20/2020 91  70 - 99 mg/dL Final   Glucose reference range applies only to samples taken after fasting for at least 8 hours.  . BUN 06/20/2020 13  8 - 23 mg/dL Final  . Creatinine, Ser 06/20/2020 0.77  0.44 - 1.00 mg/dL Final  . Calcium 06/20/2020 9.6  8.9 - 10.3 mg/dL Final  . GFR, Estimated 06/20/2020 >60  >60 mL/min Final  . Anion gap 06/20/2020 11  5 - 15 Final   Performed at [facility], [address] [place], [place] [address]  . WBC 06/20/2020 4.5  4.0 - 10.5 K/uL Final  . RBC 06/20/2020 3.61* 3.87 - 5.11 MIL/uL Final  . Hemoglobin 06/20/2020 11.4* 12.0 - 15.0 g/dL Final  . HCT 06/20/2020 34.6* 36 - 46 % Final  . MCV 06/20/2020 95.8  80.0 - 100.0 fL Final  . MCH 06/20/2020 31.6  26.0 - 34.0 pg Final  . MCHC 06/20/2020 32.9  30.0 - 36.0 g/dL Final  . RDW 06/20/2020 12.1  11.5 - 15.5 % Final  .  Platelets 06/20/2020 282  150 - 400 K/uL Final  . nRBC 06/20/2020 0.0  0.0 - 0.2 % Final   Performed at [facility], [address], [place], [place] [address]  . Prothrombin Time 06/20/2020 16.5* 11.4 - 15.2 seconds Final  . INR 06/20/2020 1.4* 0.8 - 1.2 Final   Comment: (NOTE) INR goal varies based on device and disease states. Performed at [facility], [address], [name], [place] [address]   . Color, Urine 06/20/2020 STRAW* YELLOW Final  . APPearance 06/20/2020 CLEAR*  CLEAR Final  . Specific Gravity, Urine 06/20/2020 1.009  1.005 - 1.030 Final  . pH 06/20/2020 6.0  5.0 - 8.0 Final  . Glucose, UA 06/20/2020 NEGATIVE  NEGATIVE mg/dL Final  . Hgb urine dipstick 06/20/2020 NEGATIVE  NEGATIVE Final  . Bilirubin Urine 06/20/2020 NEGATIVE  NEGATIVE Final  . Ketones, ur 06/20/2020 NEGATIVE  NEGATIVE mg/dL Final  . Protein, ur 06/20/2020 NEGATIVE  NEGATIVE mg/dL Final  . Nitrite 06/20/2020 NEGATIVE  NEGATIVE Final  . Chalmers Guest 06/20/2020 NEGATIVE  NEGATIVE Final   Performed at [facility], [address] [place], [place] [address]    ECG: Date: 01/15/2020 Time ECG obtained: 1355 PM Rate: 63 bpm Rhythm: atrial fibrillation; LBBB Intervals: QRS 142 ms. QTc 444 ms. ST segment and T wave changes: No evidence of acute ST segment elevation or depression. Comparison: New onset A.fib noted when compared to tracing obtained on 11/21/2018.   IMAGING / PROCEDURES: ECHOCARDIOGRAM done on 01/22/2020 1. Left ventricular ejection fraction, by estimation, is 50 to 55%. Left ventricular ejection fraction by PLAX is 50 %. The left ventricle has low normal function. The left ventricle has no regional wall motion  abnormalities. Left ventricular diastolic parameters were normal.  2. Right ventricular systolic function is normal. The right ventricular size is normal. There is mildly elevated pulmonary artery systolic pressure.  3. Left atrial size was mildly  dilated.  4. The mitral valve is grossly normal. Trivial mitral valve regurgitation.  5. The aortic valve is grossly normal. Aortic valve regurgitation is not visualized. No aortic stenosis is present.   RIGHT/LEFT HEART CATHETERIZATION done on  06/02/2017 1. Mild nonobstructive coronary artery disease  There is moderate to severe left ventricular systolic dysfunction.  The left ventricular ejection fraction is 25-35% by visual estimate.  LV end diastolic pressure is moderately elevated.  Ost Cx to Prox Cx lesion, 30 %stenosed. 2. Moderately to severely reduced LV systolic function with an EF of 25-35%. 3. Right heart catheterization showed moderately elevated filling pressures, severe pulmonary hypertension and severely reduced cardiac output at 3.14 with a cardiac index of 1.38. 4. Pulmonary wedge pressure was 27 mmHg and PA pressure was 61/34 with a mean of 47 mmHg.  Impression and Plan:  Kimberly Henry has been referred for pre-anesthesia review and clearance prior to her undergoing the planned anesthetic and procedural courses. Available labs, pertinent testing, and imaging results were personally reviewed by me. This patient has been appropriately cleared by cardiology.   Based on clinical review performed today (06/24/20), barring any significant acute changes in the patient's overall condition, it is anticipated that she will be able to proceed with the planned surgical intervention. Any acute changes in clinical condition may necessitate her procedure being postponed and/or cancelled. Pre-surgical instructions were reviewed with the patient during her PAT appointment and questions were fielded by PAT clinical staff.  [name], MSN, APRN, FNP-C, CEN [facility]  Peri-operative Services Nurse Practitioner Phone: [phone] 06/24/20 9:43 AM  NOTE: This note has been prepared using Dragon dictation software. Despite my best ability to proofread, there is  always the potential that unintentional transcriptional errors may still occur from this process.

## 2020-06-24 NOTE — Progress Notes (Signed)
Cardiology Office Note   Date:  06/24/2020   ID:  Kimberly Henry, Kimberly Henry Sep 22, 1940, MRN [id number]  PCP:  Steele Sizer, MD  Cardiologist:   Kathlyn Sacramento, MD   Chief Complaint  Patient presents with  . other    4 month follow up. meds reviewed verbally with patient.       History of Present Illness: Kimberly Henry is a 79 y.o. female who is here today for follow-up visit regarding chronic systolic heart failure due to nonischemic cardiomyopathy and chronic atrial fibrillation. She has known history of essential hypertension, obesity, COPD, hyperlipidemia and osteoarthritis.She is a previous smoker but quit many years ago. She was diagnosed with systolic heart failure in August of 2018.   She was noted to have left bundle branch block.  Cardiac catheterization at that time showed mild nonobstructive coronary artery disease with an EF of 25-30%. Right heart catheterization showed severely elevated filling pressures, severely reduced cardiac output and severe pulmonary hypertension. She was treated with medical therapy and has done well with medications.  She declined CRT at some point given improvement in her symptoms.  Most recent echocardiogram in April of this year showed an EF of 50 to 55%.  Left atrium was severely dilated.  She was diagnosed with atrial fibrillation around the same time and I felt that the chance of maintaining sinus rhythm was going to be low without an antiarrhythmic medication.  Given lack of symptoms related to atrial fibrillation, I continued rate control and she has been doing well.  She denies chest pain or worsening dyspnea.  No palpitations.  She is scheduled for knee replacement soon.    Past Medical History:  Diagnosis Date  . Acid phosphatase elevated   . Atrial fibrillation ([place])   . AV block, 1st degree   . CHF (congestive heart failure) ([place])   . COPD (chronic obstructive pulmonary disease) ([place])   . HFrEF (heart failure with reduced ejection  fraction) ([place])    a. 04/2017 Echo: EF 30-35%, antsept HK, mild MR, mod dil LA, mildly dil RA, mod TR, mildly to mod elev PASP; b. 08/2017 Echo: EF 30-35%, diff HK, antsept HK. Gr1 DD. Mild to mod MR. PASP nl.  . Hx of cervical malignancy   . Hyperlipidemia   . Hypertension   . LBBB (left bundle branch block)   . Metabolic syndrome   . NICM (nonischemic cardiomyopathy) ([place])    a. 04/2017 Echo: EF 30-35%; b. 04/2017 Cath: nonobs dzs; c. 08/2017 Echo: EF 30-35%, diff HK, antsept HK. Gr1 DD. Mild to mod MR. PASP nl.  . Non-obstructive CAD (coronary artery disease)    a. 04/2017 Cath: mild nonobs CAD. EF 25-35%. CO 3.14, CI 1.38. Sev PAH [61/34(47].  . Obesity, Class III, BMI 40-49.9 (morbid obesity) ([place])   . Osteoarthritis of both knees   . PAH (pulmonary arterial hypertension) with portal hypertension ([place])    a. 04/2017 Right Heart Cath: Sev PAH 61/34(47); b. 08/2017 Echo: nl PASP.    Past Surgical History:  Procedure Laterality Date  . ABDOMINAL HYSTERECTOMY  1996  . CARDIAC CATHETERIZATION    . CATARACT EXTRACTION Left 2008  . RIGHT/LEFT HEART CATH AND CORONARY ANGIOGRAPHY N/A 04/21/2017   Procedure: RIGHT/LEFT HEART CATH AND CORONARY ANGIOGRAPHY;  Surgeon: Wellington Hampshire, MD;  Location: [facility];  Service: Cardiovascular;  Laterality: N/A;     Current Outpatient Medications  Medication Sig Dispense Refill  . acetaminophen (TYLENOL) 500 MG tablet  Take 1,000 mg by mouth every 6 (six) hours as needed for pain.     . carvedilol (COREG) 6.25 MG tablet TAKE 1 TABLET BY MOUTH  TWICE DAILY WITH MEALS (Patient taking differently: Take 6.25 mg by mouth 2 (two) times daily with a meal. ) 180 tablet 3  . digoxin (LANOXIN) 0.125 MG tablet TAKE 1 TABLET BY MOUTH  DAILY (Patient taking differently: Take 0.125 mg by mouth daily. ) 90 tablet 3  . ELIQUIS 5 MG TABS tablet TAKE 1 TABLET BY MOUTH  TWICE DAILY (Patient taking differently: Take 5 mg by mouth 2 (two) times daily. ) 180 tablet 3    . furosemide (LASIX) 40 MG tablet TAKE 1 TABLET BY MOUTH  DAILY (Patient taking differently: 40 mg daily. ) 90 tablet 3  . levocetirizine (XYZAL) 5 MG tablet Take 1 tablet (5 mg total) by mouth every evening. 90 tablet 1  . potassium chloride SA (KLOR-CON) 20 MEQ tablet Take 1 tablet (20 mEq total) by mouth daily. Must make appointment for further refills (Patient taking differently: Take 20 mEq by mouth daily. ) 90 tablet 3  . rosuvastatin (CRESTOR) 40 MG tablet Take 1 tablet (40 mg total) by mouth daily. In place of Atorvastatin (Patient taking differently: Take 40 mg by mouth daily. ) 90 tablet 3  . sacubitril-valsartan (ENTRESTO) 97-103 MG Take 1 tablet by mouth 2 (two) times daily. 180 tablet 3  . spironolactone (ALDACTONE) 25 MG tablet TAKE 1 TABLET BY MOUTH  DAILY (Patient taking differently: Take by mouth daily. ) 90 tablet 3   No current facility-administered medications for this visit.    Allergies:   Patient has no known allergies.    Social History:  The patient  reports that she quit smoking about 34 years ago. Her smoking use included cigarettes. She started smoking about 44 years ago. She quit after 10.00 years of use. She has never used smokeless tobacco. She reports that she does not drink alcohol and does not use drugs.   Family History:  The patient's family history includes Congestive Heart Failure in her father and mother; Hypertension in her mother; Prostate cancer in her son.    ROS:  Please see the history of present illness.   Otherwise, review of systems are positive for none.   All other systems are reviewed and negative.    PHYSICAL EXAM: VS:  BP 140/66 (BP Location: Left Arm, Patient Position: Sitting, Cuff Size: Normal)   Pulse (!) 58   Ht 5\' 7"  (1.702 m)   Wt 254 lb (115.2 kg)   SpO2 96%   BMI 39.78 kg/m  , BMI Body mass index is 39.78 kg/m. GEN: Well nourished, well developed, in no acute distress  HEENT: normal  Neck: no carotid bruits, or masses. No  significant JVD. Cardiac: Irregularly irregular; no murmurs, rubs, or gallops, no edema Respiratory: Clear to auscultation bilaterally, normal work of breathing GI: soft, nontender, nondistended, + BS MS: no deformity or atrophy  Skin: warm and dry, no rash Neuro:  Strength and sensation are intact Psych: euthymic mood, full affect   EKG:  EKG is ordered today. Atrial fibrillation with ventricular rate of 58 bpm, left bundle branch block.   Recent Labs: 01/09/2020: ALT 12; TSH 3.21 06/20/2020: BUN 13; Creatinine, Ser 0.77; Hemoglobin 11.4; Platelets 282; Potassium 4.5; Sodium 142    Lipid Panel    Component Value Date/Time   CHOL 163 01/09/2020 1515   TRIG 91 01/09/2020 1515   HDL  40 (L) 01/09/2020 1515   CHOLHDL 4.1 01/09/2020 1515   VLDL 22 03/02/2017 1242   LDLCALC 104 (H) 01/09/2020 1515      Wt Readings from Last 3 Encounters:  06/24/20 254 lb (115.2 kg)  06/20/20 258 lb (117 kg)  06/18/20 258 lb 9.6 oz (117.3 kg)       No flowsheet data found.    ASSESSMENT AND PLAN:  1.  Chronic systolic heart failure: Due to nonischemic cardiomyopathy.  Currently New York Heart Association class II.  She is euvolemic on current dose of furosemide.  She is on optimal medical therapy.  Most recent echocardiogram showed improvement in ejection fraction to 50 to 55%.    2. Essential hypertension: Blood pressure is well controlled on current medications.  3. Hyperlipidemia: Continue rosuvastatin.  Recent lipid profile showed an LDL of 104.  4.  Chronic atrial fibrillation: Ventricular rate is controlled with carvedilol and she is tolerating anticoagulation with Eliquis.   5.  Preop cardiovascular evaluation for knee surgery: The patient currently has no cardiac symptoms and should be considered at low risk from cardiovascular standpoint.  Eliquis can be held 2 to 3 days before surgery.   Disposition:   FU in 4 months  Signed,  Kathlyn Sacramento, MD  06/24/2020 1:40 PM      [facility]

## 2020-06-30 ENCOUNTER — Other Ambulatory Visit: Payer: Self-pay

## 2020-06-30 ENCOUNTER — Other Ambulatory Visit
Admission: RE | Admit: 2020-06-30 | Discharge: 2020-06-30 | Disposition: A | Discharge status: Home / Self Care | Payer: Medicare Other | Source: Ambulatory Visit | Attending: Orthopedic Surgery | Admitting: Orthopedic Surgery

## 2020-06-30 DIAGNOSIS — Z96651 Presence of right artificial knee joint: Secondary | ICD-10-CM | POA: Diagnosis not present

## 2020-06-30 DIAGNOSIS — J449 Chronic obstructive pulmonary disease, unspecified: Secondary | ICD-10-CM | POA: Diagnosis not present

## 2020-06-30 DIAGNOSIS — M1711 Unilateral primary osteoarthritis, right knee: Secondary | ICD-10-CM | POA: Diagnosis not present

## 2020-06-30 DIAGNOSIS — Z87891 Personal history of nicotine dependence: Secondary | ICD-10-CM | POA: Diagnosis not present

## 2020-06-30 DIAGNOSIS — Z471 Aftercare following joint replacement surgery: Secondary | ICD-10-CM | POA: Diagnosis not present

## 2020-06-30 DIAGNOSIS — I5022 Chronic systolic (congestive) heart failure: Secondary | ICD-10-CM | POA: Diagnosis not present

## 2020-06-30 DIAGNOSIS — E785 Hyperlipidemia, unspecified: Secondary | ICD-10-CM | POA: Diagnosis not present

## 2020-06-30 DIAGNOSIS — R001 Bradycardia, unspecified: Secondary | ICD-10-CM | POA: Diagnosis not present

## 2020-06-30 DIAGNOSIS — I2721 Secondary pulmonary arterial hypertension: Secondary | ICD-10-CM | POA: Diagnosis not present

## 2020-06-30 DIAGNOSIS — G8918 Other acute postprocedural pain: Secondary | ICD-10-CM | POA: Diagnosis not present

## 2020-06-30 DIAGNOSIS — I4891 Unspecified atrial fibrillation: Secondary | ICD-10-CM | POA: Diagnosis not present

## 2020-06-30 DIAGNOSIS — I110 Hypertensive heart disease with heart failure: Secondary | ICD-10-CM | POA: Diagnosis not present

## 2020-06-30 DIAGNOSIS — I2510 Atherosclerotic heart disease of native coronary artery without angina pectoris: Secondary | ICD-10-CM | POA: Diagnosis not present

## 2020-06-30 DIAGNOSIS — Z20822 Contact with and (suspected) exposure to covid-19: Secondary | ICD-10-CM | POA: Insufficient documentation

## 2020-06-30 DIAGNOSIS — I447 Left bundle-branch block, unspecified: Secondary | ICD-10-CM | POA: Diagnosis not present

## 2020-06-30 DIAGNOSIS — K766 Portal hypertension: Secondary | ICD-10-CM | POA: Diagnosis not present

## 2020-06-30 DIAGNOSIS — Z01818 Encounter for other preprocedural examination: Secondary | ICD-10-CM | POA: Insufficient documentation

## 2020-06-30 DIAGNOSIS — I428 Other cardiomyopathies: Secondary | ICD-10-CM | POA: Diagnosis not present

## 2020-06-30 DIAGNOSIS — I440 Atrioventricular block, first degree: Secondary | ICD-10-CM | POA: Diagnosis not present

## 2020-06-30 DIAGNOSIS — Z8249 Family history of ischemic heart disease and other diseases of the circulatory system: Secondary | ICD-10-CM | POA: Diagnosis not present

## 2020-06-30 DIAGNOSIS — I252 Old myocardial infarction: Secondary | ICD-10-CM | POA: Diagnosis not present

## 2020-06-30 DIAGNOSIS — Z7901 Long term (current) use of anticoagulants: Secondary | ICD-10-CM | POA: Diagnosis not present

## 2020-06-30 DIAGNOSIS — Z79899 Other long term (current) drug therapy: Secondary | ICD-10-CM | POA: Diagnosis not present

## 2020-06-30 LAB — SARS CORONAVIRUS 2 (TAT 6-24 HRS): SARS Coronavirus 2: NEGATIVE

## 2020-07-01 MED ORDER — CEFAZOLIN SODIUM-DEXTROSE 2-4 GM/100ML-% IV SOLN
2.0000 g | INTRAVENOUS | Status: AC
  Administered 2020-07-02: 2 g via INTRAVENOUS

## 2020-07-01 MED ORDER — TRANEXAMIC ACID-NACL 1000-0.7 MG/100ML-% IV SOLN
1000.0000 mg | INTRAVENOUS | Status: AC
  Administered 2020-07-02: 1000 mg via INTRAVENOUS

## 2020-07-01 MED ORDER — LACTATED RINGERS IV SOLN: INTRAVENOUS | Status: DC

## 2020-07-01 MED ORDER — CHLORHEXIDINE GLUCONATE 0.12 % MT SOLN: 15.0000 mL | Freq: Once | OROMUCOSAL | Status: AC

## 2020-07-01 MED ORDER — ORAL CARE MOUTH RINSE: 15.0000 mL | Freq: Once | OROMUCOSAL | Status: AC

## 2020-07-01 MED ORDER — FAMOTIDINE 20 MG PO TABS: 20.0000 mg | ORAL_TABLET | Freq: Once | ORAL | Status: AC

## 2020-07-02 ENCOUNTER — Encounter
Admission: AD | Disposition: A | Discharge status: Home Health | Payer: Self-pay | Source: Home / Self Care | Attending: Orthopedic Surgery

## 2020-07-02 ENCOUNTER — Other Ambulatory Visit: Payer: Self-pay

## 2020-07-02 ENCOUNTER — Inpatient Hospital Stay
Admission: AD | Admit: 2020-07-02 | Discharge: 2020-07-04 | DRG: 470 | Disposition: A | Discharge status: Home Health | Payer: Medicare Other | Attending: Internal Medicine | Admitting: Internal Medicine

## 2020-07-02 ENCOUNTER — Observation Stay: Payer: Medicare Other

## 2020-07-02 ENCOUNTER — Encounter: Payer: Self-pay | Admitting: Orthopedic Surgery

## 2020-07-02 ENCOUNTER — Ambulatory Visit: Payer: Medicare Other | Admitting: Urgent Care

## 2020-07-02 ENCOUNTER — Ambulatory Visit: Payer: Medicare Other

## 2020-07-02 DIAGNOSIS — M1711 Unilateral primary osteoarthritis, right knee: Principal | ICD-10-CM | POA: Diagnosis present

## 2020-07-02 DIAGNOSIS — Z8541 Personal history of malignant neoplasm of cervix uteri: Secondary | ICD-10-CM

## 2020-07-02 DIAGNOSIS — G8918 Other acute postprocedural pain: Secondary | ICD-10-CM | POA: Diagnosis not present

## 2020-07-02 DIAGNOSIS — I110 Hypertensive heart disease with heart failure: Secondary | ICD-10-CM | POA: Diagnosis not present

## 2020-07-02 DIAGNOSIS — E785 Hyperlipidemia, unspecified: Secondary | ICD-10-CM | POA: Diagnosis present

## 2020-07-02 DIAGNOSIS — I5022 Chronic systolic (congestive) heart failure: Secondary | ICD-10-CM | POA: Diagnosis present

## 2020-07-02 DIAGNOSIS — K766 Portal hypertension: Secondary | ICD-10-CM | POA: Diagnosis present

## 2020-07-02 DIAGNOSIS — I2721 Secondary pulmonary arterial hypertension: Secondary | ICD-10-CM | POA: Diagnosis present

## 2020-07-02 DIAGNOSIS — I447 Left bundle-branch block, unspecified: Secondary | ICD-10-CM | POA: Diagnosis present

## 2020-07-02 DIAGNOSIS — Z79899 Other long term (current) drug therapy: Secondary | ICD-10-CM

## 2020-07-02 DIAGNOSIS — Z96651 Presence of right artificial knee joint: Secondary | ICD-10-CM | POA: Diagnosis not present

## 2020-07-02 DIAGNOSIS — Z8249 Family history of ischemic heart disease and other diseases of the circulatory system: Secondary | ICD-10-CM

## 2020-07-02 DIAGNOSIS — Z471 Aftercare following joint replacement surgery: Secondary | ICD-10-CM | POA: Diagnosis not present

## 2020-07-02 DIAGNOSIS — J449 Chronic obstructive pulmonary disease, unspecified: Secondary | ICD-10-CM | POA: Diagnosis not present

## 2020-07-02 DIAGNOSIS — I428 Other cardiomyopathies: Secondary | ICD-10-CM | POA: Diagnosis present

## 2020-07-02 DIAGNOSIS — I482 Chronic atrial fibrillation, unspecified: Secondary | ICD-10-CM | POA: Diagnosis present

## 2020-07-02 DIAGNOSIS — I10 Essential (primary) hypertension: Secondary | ICD-10-CM | POA: Diagnosis present

## 2020-07-02 DIAGNOSIS — I440 Atrioventricular block, first degree: Secondary | ICD-10-CM | POA: Diagnosis present

## 2020-07-02 DIAGNOSIS — Z96659 Presence of unspecified artificial knee joint: Secondary | ICD-10-CM

## 2020-07-02 DIAGNOSIS — Z419 Encounter for procedure for purposes other than remedying health state, unspecified: Secondary | ICD-10-CM

## 2020-07-02 DIAGNOSIS — E8881 Metabolic syndrome: Secondary | ICD-10-CM | POA: Diagnosis present

## 2020-07-02 DIAGNOSIS — I2510 Atherosclerotic heart disease of native coronary artery without angina pectoris: Secondary | ICD-10-CM | POA: Diagnosis present

## 2020-07-02 DIAGNOSIS — Z87891 Personal history of nicotine dependence: Secondary | ICD-10-CM

## 2020-07-02 DIAGNOSIS — Z7901 Long term (current) use of anticoagulants: Secondary | ICD-10-CM

## 2020-07-02 DIAGNOSIS — J410 Simple chronic bronchitis: Secondary | ICD-10-CM | POA: Diagnosis present

## 2020-07-02 DIAGNOSIS — I4891 Unspecified atrial fibrillation: Secondary | ICD-10-CM | POA: Diagnosis present

## 2020-07-02 DIAGNOSIS — R001 Bradycardia, unspecified: Secondary | ICD-10-CM | POA: Diagnosis present

## 2020-07-02 DIAGNOSIS — Z6841 Body Mass Index (BMI) 40.0 and over, adult: Secondary | ICD-10-CM

## 2020-07-02 DIAGNOSIS — Z20822 Contact with and (suspected) exposure to covid-19: Secondary | ICD-10-CM | POA: Diagnosis present

## 2020-07-02 DIAGNOSIS — I252 Old myocardial infarction: Secondary | ICD-10-CM

## 2020-07-02 HISTORY — DX: Unspecified atrial fibrillation: I48.91

## 2020-07-02 HISTORY — PX: TOTAL KNEE ARTHROPLASTY: SHX125

## 2020-07-02 LAB — PROTIME-INR
INR: 1.1 (ref 0.8–1.2)
Prothrombin Time: 14.2 seconds (ref 11.4–15.2)

## 2020-07-02 LAB — TSH: TSH: 2.882 u[IU]/mL (ref 0.350–4.500)

## 2020-07-02 LAB — ABO/RH: ABO/RH(D): B POS

## 2020-07-02 LAB — BRAIN NATRIURETIC PEPTIDE: B Natriuretic Peptide: 163.9 pg/mL — ABNORMAL HIGH (ref 0.0–100.0)

## 2020-07-02 SURGERY — ARTHROPLASTY, KNEE, TOTAL
Anesthesia: Spinal | Site: Knee | Laterality: Right

## 2020-07-02 MED ORDER — CARVEDILOL 12.5 MG PO TABS: 6.2500 mg | ORAL_TABLET | Freq: Two times a day (BID) | ORAL | Status: DC

## 2020-07-02 MED ORDER — FUROSEMIDE 40 MG PO TABS
40.0000 mg | ORAL_TABLET | Freq: Every day | ORAL | Status: DC
  Administered 2020-07-02 – 2020-07-04 (×3): 40 mg via ORAL
  Filled 2020-07-02 (×3): qty 1

## 2020-07-02 MED ORDER — ROCURONIUM BROMIDE 100 MG/10ML IV SOLN
INTRAVENOUS | Status: DC | PRN
  Administered 2020-07-02: 40 mg via INTRAVENOUS
  Administered 2020-07-02: 10 mg via INTRAVENOUS

## 2020-07-02 MED ORDER — DEXMEDETOMIDINE HCL 200 MCG/2ML IV SOLN
INTRAVENOUS | Status: DC | PRN
  Administered 2020-07-02 (×3): 4 ug via INTRAVENOUS

## 2020-07-02 MED ORDER — DIPHENHYDRAMINE HCL 12.5 MG/5ML PO ELIX
12.5000 mg | ORAL_SOLUTION | ORAL | Status: DC | PRN
  Filled 2020-07-02: qty 10

## 2020-07-02 MED ORDER — ONDANSETRON HCL 4 MG/2ML IJ SOLN
INTRAMUSCULAR | Status: DC | PRN
  Administered 2020-07-02: 4 mg via INTRAVENOUS

## 2020-07-02 MED ORDER — ONDANSETRON HCL 4 MG/2ML IJ SOLN: 4.0000 mg | Freq: Four times a day (QID) | INTRAMUSCULAR | Status: DC | PRN

## 2020-07-02 MED ORDER — DEXMEDETOMIDINE (PRECEDEX) IN NS 20 MCG/5ML (4 MCG/ML) IV SYRINGE
PREFILLED_SYRINGE | INTRAVENOUS | Status: AC
  Filled 2020-07-02: qty 5

## 2020-07-02 MED ORDER — MENTHOL 3 MG MT LOZG
1.0000 | LOZENGE | OROMUCOSAL | Status: DC | PRN
  Filled 2020-07-02: qty 9

## 2020-07-02 MED ORDER — SUCCINYLCHOLINE CHLORIDE 20 MG/ML IJ SOLN
INTRAMUSCULAR | Status: DC | PRN
  Administered 2020-07-02: 120 mg via INTRAVENOUS

## 2020-07-02 MED ORDER — KETOROLAC TROMETHAMINE 15 MG/ML IJ SOLN
INTRAMUSCULAR | Status: AC
  Administered 2020-07-02: 7.5 mg via INTRAVENOUS
  Filled 2020-07-02: qty 1

## 2020-07-02 MED ORDER — CEFAZOLIN SODIUM-DEXTROSE 2-4 GM/100ML-% IV SOLN
INTRAVENOUS | Status: AC
  Administered 2020-07-02: 2 g via INTRAVENOUS
  Filled 2020-07-02: qty 100

## 2020-07-02 MED ORDER — PROPOFOL 10 MG/ML IV BOLUS
INTRAVENOUS | Status: AC
  Filled 2020-07-02: qty 20

## 2020-07-02 MED ORDER — SACUBITRIL-VALSARTAN 97-103 MG PO TABS
1.0000 | ORAL_TABLET | Freq: Two times a day (BID) | ORAL | Status: DC
  Administered 2020-07-03 – 2020-07-04 (×3): 1 via ORAL
  Filled 2020-07-02 (×4): qty 1

## 2020-07-02 MED ORDER — LIDOCAINE HCL (CARDIAC) PF 100 MG/5ML IV SOSY
PREFILLED_SYRINGE | INTRAVENOUS | Status: DC | PRN
  Administered 2020-07-02: 100 mg via INTRAVENOUS

## 2020-07-02 MED ORDER — PROPOFOL 10 MG/ML IV BOLUS
INTRAVENOUS | Status: DC | PRN
  Administered 2020-07-02: 110 mg via INTRAVENOUS

## 2020-07-02 MED ORDER — POTASSIUM CHLORIDE CRYS ER 20 MEQ PO TBCR
20.0000 meq | EXTENDED_RELEASE_TABLET | Freq: Every day | ORAL | Status: DC
  Administered 2020-07-04: 20 meq via ORAL
  Filled 2020-07-02: qty 1

## 2020-07-02 MED ORDER — MIDAZOLAM HCL 2 MG/2ML IJ SOLN
INTRAMUSCULAR | Status: AC
  Administered 2020-07-02: 1 mg via INTRAVENOUS
  Filled 2020-07-02: qty 2

## 2020-07-02 MED ORDER — METOCLOPRAMIDE HCL 10 MG PO TABS
5.0000 mg | ORAL_TABLET | Freq: Three times a day (TID) | ORAL | Status: DC | PRN
  Filled 2020-07-02: qty 1

## 2020-07-02 MED ORDER — FENTANYL CITRATE (PF) 100 MCG/2ML IJ SOLN
INTRAMUSCULAR | Status: AC
  Filled 2020-07-02: qty 2

## 2020-07-02 MED ORDER — DOCUSATE SODIUM 100 MG PO CAPS
100.0000 mg | ORAL_CAPSULE | Freq: Two times a day (BID) | ORAL | Status: DC
  Administered 2020-07-02 – 2020-07-04 (×4): 100 mg via ORAL
  Filled 2020-07-02 (×5): qty 1

## 2020-07-02 MED ORDER — PROPOFOL 500 MG/50ML IV EMUL
INTRAVENOUS | Status: AC
  Filled 2020-07-02: qty 50

## 2020-07-02 MED ORDER — BUPIVACAINE-EPINEPHRINE (PF) 0.5% -1:200000 IJ SOLN
INTRAMUSCULAR | Status: DC | PRN
  Administered 2020-07-02: 30 mL via PERINEURAL

## 2020-07-02 MED ORDER — FENTANYL CITRATE (PF) 100 MCG/2ML IJ SOLN
INTRAMUSCULAR | Status: AC
Start: 2020-07-02 — End: 2020-07-02
  Filled 2020-07-02: qty 2

## 2020-07-02 MED ORDER — BISACODYL 10 MG RE SUPP
10.0000 mg | Freq: Every day | RECTAL | Status: DC | PRN
  Filled 2020-07-02: qty 1

## 2020-07-02 MED ORDER — METHOCARBAMOL 1000 MG/10ML IJ SOLN
500.0000 mg | Freq: Four times a day (QID) | INTRAVENOUS | Status: DC | PRN
  Filled 2020-07-02: qty 5

## 2020-07-02 MED ORDER — GLYCOPYRROLATE 0.2 MG/ML IJ SOLN
INTRAMUSCULAR | Status: AC
  Filled 2020-07-02: qty 1

## 2020-07-02 MED ORDER — FENTANYL CITRATE (PF) 100 MCG/2ML IJ SOLN
50.0000 ug | Freq: Once | INTRAMUSCULAR | Status: AC
  Administered 2020-07-02: 50 ug via INTRAVENOUS

## 2020-07-02 MED ORDER — ROSUVASTATIN CALCIUM 20 MG PO TABS
40.0000 mg | ORAL_TABLET | Freq: Every day | ORAL | Status: DC
  Administered 2020-07-02 – 2020-07-04 (×3): 40 mg via ORAL
  Filled 2020-07-02 (×3): qty 2

## 2020-07-02 MED ORDER — METHOCARBAMOL 500 MG PO TABS
500.0000 mg | ORAL_TABLET | Freq: Four times a day (QID) | ORAL | Status: DC | PRN
  Filled 2020-07-02: qty 1

## 2020-07-02 MED ORDER — CHLORHEXIDINE GLUCONATE 0.12 % MT SOLN
OROMUCOSAL | Status: AC
  Administered 2020-07-02: 15 mL via OROMUCOSAL
  Filled 2020-07-02: qty 15

## 2020-07-02 MED ORDER — FAMOTIDINE 20 MG PO TABS
ORAL_TABLET | ORAL | Status: AC
  Administered 2020-07-02: 20 mg via ORAL
  Filled 2020-07-02: qty 1

## 2020-07-02 MED ORDER — SODIUM CHLORIDE 0.9 % IV SOLN
INTRAVENOUS | Status: DC | PRN
  Administered 2020-07-02: 60 mL

## 2020-07-02 MED ORDER — DIGOXIN 125 MCG PO TABS: 125.0000 ug | ORAL_TABLET | Freq: Every day | ORAL | Status: DC

## 2020-07-02 MED ORDER — MAGNESIUM CITRATE PO SOLN
1.0000 | Freq: Once | ORAL | Status: DC | PRN
  Filled 2020-07-02: qty 296

## 2020-07-02 MED ORDER — APIXABAN 5 MG PO TABS
5.0000 mg | ORAL_TABLET | Freq: Two times a day (BID) | ORAL | Status: DC
  Administered 2020-07-03 – 2020-07-04 (×2): 5 mg via ORAL
  Filled 2020-07-02 (×3): qty 1

## 2020-07-02 MED ORDER — ACETAMINOPHEN 500 MG PO TABS
ORAL_TABLET | ORAL | Status: AC
  Filled 2020-07-02: qty 1

## 2020-07-02 MED ORDER — MIDAZOLAM HCL 2 MG/2ML IJ SOLN: 1.0000 mg | Freq: Once | INTRAMUSCULAR | Status: AC

## 2020-07-02 MED ORDER — GLYCOPYRROLATE 0.2 MG/ML IJ SOLN
0.2000 mg | Freq: Once | INTRAMUSCULAR | Status: AC
  Administered 2020-07-02: 0.2 mg via INTRAVENOUS

## 2020-07-02 MED ORDER — ROPIVACAINE HCL 5 MG/ML IJ SOLN
INTRAMUSCULAR | Status: AC
  Filled 2020-07-02: qty 30

## 2020-07-02 MED ORDER — SODIUM CHLORIDE 0.9 % IV SOLN
INTRAVENOUS | Status: DC | PRN
  Administered 2020-07-02: 20 ug/min via INTRAVENOUS

## 2020-07-02 MED ORDER — CETIRIZINE HCL 10 MG PO TABS
5.0000 mg | ORAL_TABLET | Freq: Every evening | ORAL | Status: DC
  Administered 2020-07-02 – 2020-07-03 (×2): 5 mg via ORAL
  Filled 2020-07-02 (×3): qty 1

## 2020-07-02 MED ORDER — BUPIVACAINE LIPOSOME 1.3 % IJ SUSP
INTRAMUSCULAR | Status: AC
  Filled 2020-07-02: qty 20

## 2020-07-02 MED ORDER — ACETAMINOPHEN 500 MG PO TABS
500.0000 mg | ORAL_TABLET | Freq: Four times a day (QID) | ORAL | Status: AC
  Administered 2020-07-02 – 2020-07-03 (×2): 500 mg via ORAL

## 2020-07-02 MED ORDER — ACETAMINOPHEN 500 MG PO TABS
ORAL_TABLET | ORAL | Status: AC
  Administered 2020-07-02: 500 mg via ORAL
  Filled 2020-07-02: qty 1

## 2020-07-02 MED ORDER — DEXAMETHASONE SODIUM PHOSPHATE 10 MG/ML IJ SOLN
INTRAMUSCULAR | Status: AC
  Filled 2020-07-02: qty 1

## 2020-07-02 MED ORDER — HYDROCODONE-ACETAMINOPHEN 7.5-325 MG PO TABS: 1.0000 | ORAL_TABLET | ORAL | Status: DC | PRN

## 2020-07-02 MED ORDER — FENTANYL CITRATE (PF) 100 MCG/2ML IJ SOLN
25.0000 ug | INTRAMUSCULAR | Status: DC | PRN
  Administered 2020-07-02 (×4): 25 ug via INTRAVENOUS

## 2020-07-02 MED ORDER — HYDRALAZINE HCL 20 MG/ML IJ SOLN: 5.0000 mg | INTRAMUSCULAR | Status: DC | PRN

## 2020-07-02 MED ORDER — SPIRONOLACTONE 25 MG PO TABS
25.0000 mg | ORAL_TABLET | Freq: Every day | ORAL | Status: DC
  Administered 2020-07-03 – 2020-07-04 (×2): 25 mg via ORAL
  Filled 2020-07-02 (×2): qty 1

## 2020-07-02 MED ORDER — GLYCOPYRROLATE 0.2 MG/ML IJ SOLN
INTRAMUSCULAR | Status: DC | PRN
  Administered 2020-07-02: .2 mg via INTRAVENOUS

## 2020-07-02 MED ORDER — PHENOL 1.4 % MT LIQD
1.0000 | OROMUCOSAL | Status: DC | PRN
  Filled 2020-07-02: qty 177

## 2020-07-02 MED ORDER — ALBUTEROL SULFATE HFA 108 (90 BASE) MCG/ACT IN AERS
2.0000 | INHALATION_SPRAY | RESPIRATORY_TRACT | Status: DC | PRN
  Filled 2020-07-02: qty 6.7

## 2020-07-02 MED ORDER — CEFAZOLIN SODIUM-DEXTROSE 2-4 GM/100ML-% IV SOLN: 2.0000 g | Freq: Four times a day (QID) | INTRAVENOUS | Status: AC

## 2020-07-02 MED ORDER — POVIDONE-IODINE 10 % EX SWAB
2.0000 "application " | Freq: Once | CUTANEOUS | Status: AC
  Administered 2020-07-02: 2 via TOPICAL

## 2020-07-02 MED ORDER — METOCLOPRAMIDE HCL 5 MG/ML IJ SOLN: 5.0000 mg | Freq: Three times a day (TID) | INTRAMUSCULAR | Status: DC | PRN

## 2020-07-02 MED ORDER — KETOROLAC TROMETHAMINE 15 MG/ML IJ SOLN: 7.5000 mg | Freq: Four times a day (QID) | INTRAMUSCULAR | Status: AC

## 2020-07-02 MED ORDER — FENTANYL CITRATE (PF) 100 MCG/2ML IJ SOLN
INTRAMUSCULAR | Status: DC | PRN
  Administered 2020-07-02 (×2): 25 ug via INTRAVENOUS
  Administered 2020-07-02: 50 ug via INTRAVENOUS

## 2020-07-02 MED ORDER — DEXAMETHASONE SODIUM PHOSPHATE 10 MG/ML IJ SOLN
INTRAMUSCULAR | Status: DC | PRN
  Administered 2020-07-02: 8 mg via INTRAVENOUS

## 2020-07-02 MED ORDER — SUCCINYLCHOLINE CHLORIDE 200 MG/10ML IV SOSY
PREFILLED_SYRINGE | INTRAVENOUS | Status: AC
  Filled 2020-07-02: qty 10

## 2020-07-02 MED ORDER — LIDOCAINE HCL (PF) 2 % IJ SOLN
INTRAMUSCULAR | Status: AC
  Filled 2020-07-02: qty 10

## 2020-07-02 MED ORDER — CEFAZOLIN SODIUM-DEXTROSE 2-4 GM/100ML-% IV SOLN: 2.0000 g | Freq: Four times a day (QID) | INTRAVENOUS | Status: DC

## 2020-07-02 MED ORDER — HYDROCODONE-ACETAMINOPHEN 7.5-325 MG PO TABS
ORAL_TABLET | ORAL | Status: AC
  Administered 2020-07-02: 2 via ORAL
  Filled 2020-07-02: qty 2

## 2020-07-02 MED ORDER — PHENYLEPHRINE HCL (PRESSORS) 10 MG/ML IV SOLN
INTRAVENOUS | Status: DC | PRN
  Administered 2020-07-02 (×3): 100 ug via INTRAVENOUS

## 2020-07-02 MED ORDER — MORPHINE SULFATE (PF) 2 MG/ML IV SOLN: 0.5000 mg | INTRAVENOUS | Status: DC | PRN

## 2020-07-02 MED ORDER — LACTATED RINGERS IV SOLN
INTRAVENOUS | Status: DC
  Administered 2020-07-02: 1000 mL via INTRAVENOUS

## 2020-07-02 MED ORDER — ACETAMINOPHEN 325 MG PO TABS
325.0000 mg | ORAL_TABLET | Freq: Four times a day (QID) | ORAL | Status: DC | PRN
  Administered 2020-07-04: 06:00:00 650 mg via ORAL
  Filled 2020-07-02: qty 2

## 2020-07-02 MED ORDER — SUGAMMADEX SODIUM 200 MG/2ML IV SOLN
INTRAVENOUS | Status: DC | PRN
  Administered 2020-07-02: 250 mg via INTRAVENOUS

## 2020-07-02 MED ORDER — ROPIVACAINE HCL 5 MG/ML IJ SOLN
INTRAMUSCULAR | Status: DC | PRN
  Administered 2020-07-02: 30 mL via EPIDURAL

## 2020-07-02 MED ORDER — ONDANSETRON HCL 4 MG/2ML IJ SOLN: 4.0000 mg | Freq: Once | INTRAMUSCULAR | Status: DC | PRN

## 2020-07-02 MED ORDER — PHENYLEPHRINE HCL (PRESSORS) 10 MG/ML IV SOLN
INTRAVENOUS | Status: AC
  Filled 2020-07-02: qty 2

## 2020-07-02 MED ORDER — BUPIVACAINE-EPINEPHRINE (PF) 0.5% -1:200000 IJ SOLN
INTRAMUSCULAR | Status: AC
  Filled 2020-07-02: qty 90

## 2020-07-02 MED ORDER — SEVOFLURANE IN SOLN
RESPIRATORY_TRACT | Status: AC
  Filled 2020-07-02: qty 250

## 2020-07-02 MED ORDER — ONDANSETRON HCL 4 MG PO TABS: 4.0000 mg | ORAL_TABLET | Freq: Four times a day (QID) | ORAL | Status: DC | PRN

## 2020-07-02 MED ORDER — ALUM & MAG HYDROXIDE-SIMETH 200-200-20 MG/5ML PO SUSP: 30.0000 mL | ORAL | Status: DC | PRN

## 2020-07-02 MED ORDER — CEFAZOLIN SODIUM-DEXTROSE 2-4 GM/100ML-% IV SOLN
INTRAVENOUS | Status: AC
  Filled 2020-07-02: qty 100

## 2020-07-02 MED ORDER — TRANEXAMIC ACID-NACL 1000-0.7 MG/100ML-% IV SOLN
INTRAVENOUS | Status: AC
  Filled 2020-07-02: qty 100

## 2020-07-02 MED ORDER — SODIUM CHLORIDE FLUSH 0.9 % IV SOLN
INTRAVENOUS | Status: AC
  Filled 2020-07-02: qty 40

## 2020-07-02 MED ORDER — HYDROCODONE-ACETAMINOPHEN 5-325 MG PO TABS
1.0000 | ORAL_TABLET | ORAL | Status: DC | PRN
  Administered 2020-07-04 (×2): 2 via ORAL
  Filled 2020-07-02 (×2): qty 2

## 2020-07-02 MED ORDER — SUGAMMADEX SODIUM 500 MG/5ML IV SOLN
INTRAVENOUS | Status: AC
  Filled 2020-07-02: qty 5

## 2020-07-02 MED ORDER — ONDANSETRON HCL 4 MG/2ML IJ SOLN
INTRAMUSCULAR | Status: AC
  Filled 2020-07-02: qty 2

## 2020-07-02 SURGICAL SUPPLY — 62 items
BLADE SAW 18WX90L 1.27 THK (BLADE) ×3 IMPLANT
BLADE SAW SAG 25X90X1.19 (BLADE) ×6 IMPLANT
BOWL CEMENT MIX W/ADAPTER (MISCELLANEOUS) ×3 IMPLANT
BRUSH SCRUB EZ  4% CHG (MISCELLANEOUS) ×4
BRUSH SCRUB EZ 4% CHG (MISCELLANEOUS) ×2 IMPLANT
CANISTER SUCT 1200ML W/VALVE (MISCELLANEOUS) ×3 IMPLANT
CANISTER SUCT 3000ML PPV (MISCELLANEOUS) ×6 IMPLANT
CEMENT BONE 1-PACK (Cement) ×6 IMPLANT
CHLORAPREP W/TINT 26 (MISCELLANEOUS) ×6 IMPLANT
COMP PATELLA GENESIS 29 OVAL (Orthopedic Implant) ×3 IMPLANT
COMPONENT FEM OXINIUM RT SZ4 (Knees) ×3 IMPLANT
COMPONENT PTLLA GENS 29 OVAL (Orthopedic Implant) ×1 IMPLANT
COMPONENT TIBIA RIGHT SZ 4 (Knees) ×1 IMPLANT
COOLER POLAR GLACIER W/PUMP (MISCELLANEOUS) ×3 IMPLANT
COVER WAND RF STERILE (DRAPES) ×3 IMPLANT
CUFF TOURN SGL QUICK 30 (TOURNIQUET CUFF) ×2
CUFF TRNQT CYL 30X4X21-28X (TOURNIQUET CUFF) ×1 IMPLANT
DRAPE 3/4 80X56 (DRAPES) ×6 IMPLANT
DRAPE INCISE IOBAN 66X60 STRL (DRAPES) ×3 IMPLANT
DRSG TEGADERM 6X8 (GAUZE/BANDAGES/DRESSINGS) ×3 IMPLANT
ELECT REM PT RETURN 9FT ADLT (ELECTROSURGICAL) ×3
ELECTRODE REM PT RTRN 9FT ADLT (ELECTROSURGICAL) ×1 IMPLANT
GAUZE SPONGE 4X4 12PLY STRL (GAUZE/BANDAGES/DRESSINGS) ×3 IMPLANT
GAUZE XEROFORM 1X8 LF (GAUZE/BANDAGES/DRESSINGS) ×3 IMPLANT
GLOVE BIO SURGEON STRL SZ8 (GLOVE) ×3 IMPLANT
GLOVE BIOGEL PI IND STRL 8.5 (GLOVE) ×1 IMPLANT
GLOVE BIOGEL PI INDICATOR 8.5 (GLOVE) ×2
GLOVE INDICATOR 8.0 STRL GRN (GLOVE) ×3 IMPLANT
GLOVE SURG ORTHO 8.0 STRL STRW (GLOVE) ×12 IMPLANT
GOWN STRL REUS W/ TWL LRG LVL3 (GOWN DISPOSABLE) ×1 IMPLANT
GOWN STRL REUS W/ TWL XL LVL3 (GOWN DISPOSABLE) ×1 IMPLANT
GOWN STRL REUS W/TWL LRG LVL3 (GOWN DISPOSABLE) ×2
GOWN STRL REUS W/TWL XL LVL3 (GOWN DISPOSABLE) ×2
HOOD PEEL AWAY FLYTE STAYCOOL (MISCELLANEOUS) ×9 IMPLANT
INSERT TIB XLPE 13 SZ 3-4 (Knees) ×3 IMPLANT
IRRIGATION SURGIPHOR STRL (IV SOLUTION) ×3 IMPLANT
IV NS 1000ML (IV SOLUTION) ×2
IV NS 1000ML BAXH (IV SOLUTION) ×1 IMPLANT
KIT TURNOVER KIT A (KITS) ×3 IMPLANT
MAT ABSORB  FLUID 56X50 GRAY (MISCELLANEOUS) ×2
MAT ABSORB FLUID 56X50 GRAY (MISCELLANEOUS) ×1 IMPLANT
NDL SAFETY ECLIPSE 18X1.5 (NEEDLE) ×1 IMPLANT
NEEDLE HYPO 18GX1.5 SHARP (NEEDLE) ×2
NEEDLE SPNL 20GX3.5 QUINCKE YW (NEEDLE) ×3 IMPLANT
NS IRRIG 1000ML POUR BTL (IV SOLUTION) ×3 IMPLANT
PACK TOTAL KNEE (MISCELLANEOUS) ×3 IMPLANT
PAD DE MAYO PRESSURE PROTECT (MISCELLANEOUS) ×6 IMPLANT
PAD WRAPON POLAR KNEE (MISCELLANEOUS) ×1 IMPLANT
PULSAVAC PLUS IRRIG FAN TIP (DISPOSABLE) ×3
STAPLER SKIN PROX 35W (STAPLE) ×3 IMPLANT
SUCTION FRAZIER HANDLE 10FR (MISCELLANEOUS) ×2
SUCTION TUBE FRAZIER 10FR DISP (MISCELLANEOUS) ×1 IMPLANT
SUT DVC 2 QUILL PDO  T11 36X36 (SUTURE) ×2
SUT DVC 2 QUILL PDO T11 36X36 (SUTURE) ×1 IMPLANT
SUT VIC AB 2-0 CT1 18 (SUTURE) ×3 IMPLANT
SUT VIC AB 2-0 CT1 27 (SUTURE)
SUT VIC AB 2-0 CT1 TAPERPNT 27 (SUTURE) IMPLANT
SUT VIC AB PLUS 45CM 1-MO-4 (SUTURE) ×3 IMPLANT
SYR 30ML LL (SYRINGE) ×9 IMPLANT
TIBIA RIGHT SZ 4 (Knees) ×3 IMPLANT
TIP FAN IRRIG PULSAVAC PLUS (DISPOSABLE) ×1 IMPLANT
WRAPON POLAR PAD KNEE (MISCELLANEOUS) ×3

## 2020-07-02 NOTE — Plan of Care (Signed)

## 2020-07-02 NOTE — Progress Notes (Signed)
Hospitalist Consult placed per verbal order from Dr. Alvin Critchley. Patient with observed bradycardia prior to surgery, HR in upper 40's to 50's. Status post surgery with observed pauses in report. Patient placed on telemetry and denies any chest pain or any heart palpitations. Pt confirmed taking beta blocker Coreg dose today.  Notified Dr. Ileana Ladd and discussed above, plan will be to see patient later today. Will continue to monitor patient closely. Pt is in no acute distress at this time, with room near nurses station. VS Stable. HR as of this writing remains 56.

## 2020-07-02 NOTE — Anesthesia Procedure Notes (Signed)
Procedure Name: Intubation Date/Time: 07/02/2020 8:54 AM Performed by: Nelda Marseille, CRNA Pre-anesthesia Checklist: Patient identified, Patient being monitored, Timeout performed, Emergency Drugs available and Suction available Patient Re-evaluated:Patient Re-evaluated prior to induction Oxygen Delivery Method: Circle System Utilized Preoxygenation: Pre-oxygenation with 100% oxygen Induction Type: IV induction Ventilation: Mask ventilation without difficulty Laryngoscope Size: 3 and McGraph Grade View: Grade I Tube type: Oral Tube size: 7.0 mm Number of attempts: 1 Airway Equipment and Method: Stylet and Oral airway Placement Confirmation: ETT inserted through vocal cords under direct vision,  positive ETCO2 and breath sounds checked- equal and bilateral Secured at: 21 cm Tube secured with: Tape Dental Injury: Teeth and Oropharynx as per pre-operative assessment  Difficulty Due To: Difficulty was unanticipated

## 2020-07-02 NOTE — Transfer of Care (Signed)
Immediate Anesthesia Transfer of Care Note  Patient: Kimberly Henry  Procedure(s) Performed: TOTAL KNEE ARTHROPLASTY (Right Knee)  Patient Location: PACU  Anesthesia Type:General  Level of Consciousness: awake, drowsy and patient cooperative  Airway & Oxygen Therapy: Patient Spontanous Breathing and Patient connected to face mask oxygen  Post-op Assessment: Report given to RN and Post -op Vital signs reviewed and stable  Post vital signs: Reviewed and stable  Last Vitals:  Vitals Value Taken Time  BP 98/81 07/02/20 1053  Temp    Pulse 143 07/02/20 1056  Resp 22 07/02/20 1056  SpO2 99 % 07/02/20 1056  Vitals shown include unvalidated device data.  Last Pain:  Vitals:   07/02/20 0742  TempSrc: Tympanic  PainSc: 0-No pain         Complications: No complications documented.

## 2020-07-02 NOTE — OR Nursing (Signed)
Patient having a lot of pain, heart rate in the 30s at times, Dr. Kayleen Memos notified and order robinul 0.2.  Robinul and fentanyl given xray at bedside.

## 2020-07-02 NOTE — Progress Notes (Signed)
Patient's heart rate dropping into 30s and 40s while sleeping. When awakened, heart rate rises back into the 50s. Coreg and digoxin held earlier in the day. Pt denies symptoms. States she has had low heart rate in past, at times in the 30s. Notified B. Randol Kern, NP. Per order from NP, we will continue to monitor unless, pt becomes symptomatic. Order received to keep patient on bedrest until in the AM. Also order to check stat labs.

## 2020-07-02 NOTE — Consult Note (Addendum)
Medical Consultation   Kimberly Henry  IHK:[id number]  DOB: [birth year]  DOA: 07/02/2020  PCP: Steele Sizer, MD  Outpatient Specialists:    Requesting physician: -Dr. Harlow Mares of ortho   Reason for consultation: -Bradycardia after surgery   History of Present Illness: Kimberly Henry is an 79 y.o. female hypertension, hyperlipidemia, COPD, PAH, obesity, CAD, left bundle blockage,dCHF, AV block first-degree, atrial fibrillation on Eliquis, who developed bradycardia after right knee replacement surgery.  We are asked to consult.  Pt underwent right total knee arthroplasty by Dr. Harlow Mares of ortho today. Pt tolerated procedure well.  After surgery, patient developed bradycardia with heart rate 40-50s.  Patient is asymptomatic.  Denies chest pain, shortness breath, dizziness, lightheadedness.  No palpitation.  Denies cough, fever or chills.  No nausea vomiting, diarrhea, abdominal pain, symptoms of UTI or unilateral weakness.  Her pain in her right knee seems to be controlled.  Lab: pending CBC, BMP.  Blood pressure 111/46, RR 18, oxygen saturation 99% on room air, temperature 96.1.  Review of Systems:   General: no fevers, chills, no changes in body weight, no changes in appetite Skin: no rash HEENT: no blurry vision, hearing changes or sore throat Pulm: no dyspnea, coughing, wheezing CV: no chest pain, palpitations, shortness of breath Abd: no nausea/vomiting, abdominal pain, diarrhea/constipation GU: no dysuria, hematuria, polyuria Ext: no arthralgias, myalgias. S/p of right knee arthroplasty with tenderness. Neuro: no weakness, numbness, or tingling    Past Medical History: Past Medical History:  Diagnosis Date  . Acid phosphatase elevated   . Atrial fibrillation ([place])   . AV block, 1st degree   . CHF (congestive heart failure) ([place])   . COPD (chronic obstructive pulmonary disease) ([place])   . HFrEF (heart failure with reduced ejection fraction) ([place])    a.  04/2017 Echo: EF 30-35%, antsept HK, mild MR, mod dil LA, mildly dil RA, mod TR, mildly to mod elev PASP; b. 08/2017 Echo: EF 30-35%, diff HK, antsept HK. Gr1 DD. Mild to mod MR. PASP nl.  . Hx of cervical malignancy   . Hyperlipidemia   . Hypertension   . LBBB (left bundle branch block)   . Metabolic syndrome   . NICM (nonischemic cardiomyopathy) ([place])    a. 04/2017 Echo: EF 30-35%; b. 04/2017 Cath: nonobs dzs; c. 08/2017 Echo: EF 30-35%, diff HK, antsept HK. Gr1 DD. Mild to mod MR. PASP nl.  . Non-obstructive CAD (coronary artery disease)    a. 04/2017 Cath: mild nonobs CAD. EF 25-35%. CO 3.14, CI 1.38. Sev PAH [61/34(47].  . Obesity, Class III, BMI 40-49.9 (morbid obesity) ([place])   . Osteoarthritis of both knees   . PAH (pulmonary arterial hypertension) with portal hypertension ([place])    a. 04/2017 Right Heart Cath: Sev PAH 61/34(47); b. 08/2017 Echo: nl PASP.    Past Surgical History: Past Surgical History:  Procedure Laterality Date  . ABDOMINAL HYSTERECTOMY  1996  . CARDIAC CATHETERIZATION    . CATARACT EXTRACTION Left 2008  . RIGHT/LEFT HEART CATH AND CORONARY ANGIOGRAPHY N/A 04/21/2017   Procedure: RIGHT/LEFT HEART CATH AND CORONARY ANGIOGRAPHY;  Surgeon: Wellington Hampshire, MD;  Location: [facility];  Service: Cardiovascular;  Laterality: N/A;     Allergies:  No Known Allergies   Social History:  reports that she quit smoking about 34 years ago. Her smoking use included cigarettes. She started smoking about 44 years ago. She quit  after 10.00 years of use. She has never used smokeless tobacco. She reports that she does not drink alcohol and does not use drugs.   Family History: Family History  Problem Relation Age of Onset  . Hypertension Mother   . Congestive Heart Failure Mother   . Congestive Heart Failure Father   . Prostate cancer Son     Physical Exam: Vitals:   07/02/20 1153 07/02/20 1208 07/02/20 1227 07/02/20 1445  BP: 108/65 119/64 (!) 111/46 104/78    Pulse: 62 (!) 51 (!) 54 62  Resp: 16 16 18 17   Temp:   (!) 96.1 F (35.6 C) (!) 96.6 F (35.9 C)  TempSrc:   Temporal Tympanic  SpO2: 100% 100% 99% 100%  Weight:      Height:       General: Not in acute distress HEENT: PERRL, EOMI, no scleral icterus, No JVD or bruit Cardiac: S1/S2, RRR, No murmurs, gallops or rubs Pulm: Clear to auscultation bilaterally. No rales, wheezing, rhonchi or rubs. Abd: Soft, nondistended, nontender, no rebound pain, no organomegaly, BS present Ext: No edema. 1+DP/PT pulse bilaterally Musculoskeletal:  S/p of right knee arthroplasty with mild tenderness. Skin: No rashes.  Neuro: Alert and oriented X3, cranial nerves II-XII grossly intact Psych: Patient is not psychotic, no suicidal or hemocidal ideation.   Data reviewed:  I have personally reviewed following labs and imaging studies Labs:  CBC: No results for input(s): WBC, NEUTROABS, HGB, HCT, MCV, PLT in the last 168 hours.  Basic Metabolic Panel: No results for input(s): NA, K, CL, CO2, GLUCOSE, BUN, CREATININE, CALCIUM, MG, PHOS in the last 168 hours. GFR Estimated Creatinine Clearance: 74.7 mL/min (by C-G formula based on SCr of 0.77 mg/dL). Liver Function Tests: No results for input(s): AST, ALT, ALKPHOS, BILITOT, PROT, ALBUMIN in the last 168 hours. No results for input(s): LIPASE, AMYLASE in the last 168 hours. No results for input(s): AMMONIA in the last 168 hours. Coagulation profile Recent Labs  Lab 07/02/20 0818  INR 1.1    Cardiac Enzymes: No results for input(s): CKTOTAL, CKMB, CKMBINDEX, TROPONINI in the last 168 hours. BNP: Invalid input(s): POCBNP CBG: No results for input(s): GLUCAP in the last 168 hours. D-Dimer No results for input(s): DDIMER in the last 72 hours. Hgb A1c No results for input(s): HGBA1C in the last 72 hours. Lipid Profile No results for input(s): CHOL, HDL, LDLCALC, TRIG, CHOLHDL, LDLDIRECT in the last 72 hours. Thyroid function studies Recent  Labs    07/02/20 1402  TSH 2.882   Anemia work up No results for input(s): VITAMINB12, FOLATE, FERRITIN, TIBC, IRON, RETICCTPCT in the last 72 hours. Urinalysis    Component Value Date/Time   COLORURINE STRAW (A) 06/20/2020 1423   APPEARANCEUR CLEAR (A) 06/20/2020 1423   LABSPEC 1.009 06/20/2020 1423   PHURINE 6.0 06/20/2020 1423   GLUCOSEU NEGATIVE 06/20/2020 1423   HGBUR NEGATIVE 06/20/2020 [place] 06/20/2020 1423   BILIRUBINUR neg 06/18/2020 0934   KETONESUR NEGATIVE 06/20/2020 1423   PROTEINUR NEGATIVE 06/20/2020 1423   UROBILINOGEN 0.2 06/18/2020 0934   NITRITE NEGATIVE 06/20/2020 1423   LEUKOCYTESUR NEGATIVE 06/20/2020 1423     Microbiology Recent Results (from the past 240 hour(s))  SARS CORONAVIRUS 2 (TAT 6-24 HRS) Nasopharyngeal Nasopharyngeal Swab     Status: None   Collection Time: 06/30/20 11:09 AM   Specimen: Nasopharyngeal Swab  Result Value Ref Range Status   SARS Coronavirus 2 NEGATIVE NEGATIVE Final    Comment: (NOTE) SARS-CoV-2 target  nucleic acids are NOT DETECTED.  The SARS-CoV-2 RNA is generally detectable in upper and lower respiratory specimens during the acute phase of infection. Negative results do not preclude SARS-CoV-2 infection, do not rule out co-infections with other pathogens, and should not be used as the sole basis for treatment or other patient management decisions. Negative results must be combined with clinical observations, patient history, and epidemiological information. The expected result is Negative.  Fact Sheet for Patients: SugarRoll.be  Fact Sheet for Healthcare Providers: https://www.woods-mathews.com/  This test is not yet approved or cleared by the [place] FDA and  has been authorized for detection and/or diagnosis of SARS-CoV-2 by FDA under an Emergency Use Authorization (EUA). This EUA will remain  in effect (meaning this test can be used) for the  duration of the COVID-19 declaration under Se ction 564(b)(1) of the Act, 21 U.S.C. section 360bbb-3(b)(1), unless the authorization is terminated or revoked sooner.  Performed at [facility], [place] [address], [place], [place] [address]        Inpatient Medications:   Scheduled Meds: . acetaminophen  500 mg Oral Q6H  . [START ON 07/03/2020] apixaban  5 mg Oral BID  . cetirizine  5 mg Oral QPM  . [START ON 07/03/2020] digoxin  125 mcg Oral Daily  . docusate sodium  100 mg Oral BID  . furosemide  40 mg Oral Daily  . ketorolac  7.5 mg Intravenous Q6H  . [START ON 07/03/2020] potassium chloride SA  20 mEq Oral Daily  . rosuvastatin  40 mg Oral Daily  . [START ON 07/03/2020] sacubitril-valsartan  1 tablet Oral BID  . [START ON 07/03/2020] spironolactone  25 mg Oral Daily   Continuous Infusions: .  ceFAZolin (ANCEF) IV Stopped (07/02/20 1536)  . lactated ringers 75 mL/hr at 07/02/20 1300  . methocarbamol (ROBAXIN) IV       Radiological Exams on Admission: DG Knee Right Port  Result Date: 07/02/2020 CLINICAL DATA:  Status post total knee replacement EXAM: PORTABLE RIGHT KNEE - 1-2 VIEW COMPARISON:  None. FINDINGS: Frontal and lateral views were obtained. Patient is status post total knee replacement with femoral and tibial prosthetic components well-seated. No fracture or dislocation. No erosive change. There are spurs along the superior patella anteriorly and posteriorly. Air within the joint is an expected postoperative finding. IMPRESSION: Status post total knee replacement with prosthetic components well-seated. No fracture or dislocation. Spurring along the anterior patella likely represents distal quadriceps and proximal patellar tendinosis. Electronically Signed   By: Lowella Grip III M.D.   On: 07/02/2020 11:33   [place] OR NERVE BLOCK-IMAGE ONLY [facility])  Result Date: 07/02/2020 There is no interpretation for this exam.  This order is for images obtained during a  surgical procedure.  Please See "Surgeries" Tab for more information regarding the procedure.    Impression/Recommendations Principal Problem:   S/P TKR (total knee replacement) using cement, right Active Problems:   Hypertension, benign   Dyslipidemia   Chronic systolic heart failure (HCC)   COPD (chronic obstructive pulmonary disease) (HCC)   CAD (coronary artery disease)   Atrial fibrillation (H[facility])   Bradycardia  S/P TKR (total knee replacement) using cement, right: Patient's pain is controlled now. -Pain management per primary team of Ortho  Bradycardia: Initial heart rate 40s-50s, currently heart rate in 60s.  Etiology is not clear, but the patient is asymptomatic. -Cardiac monitoring -Hold Coreg and digoxin today -will get EKG  Hypertension, benign -IV hydralazine as needed -Continue home Lasix,  Entresto, spironolactone which are for CHF.  Dyslipidemia:  -Continue Crestor  Chronic systolic heart failure ([place]): 2D echo on 01/22/2020 showed EF of 50 to 55%.  Patient does not have leg edema.  BNP 163, no shortness of breath.  CHF is compensated. -Continue home Lasix, Entresto, spironolactone  COPD (chronic obstructive pulmonary disease) ([place]): Stable -As needed albuterol  CAD (coronary artery disease) -Continue Crestor  Atrial fibrillation [facility]): Currently has slow heart rate -Hold digoxin and Coreg -Eliquis is started by surgeon    Thank you for this consultation.  Our [facility] hospitalist team will follow the patient with you.   Time Spent:  20 min  Ivor Costa M.D. [facility] 07/02/2020, 4:43 PM

## 2020-07-02 NOTE — Anesthesia Preprocedure Evaluation (Signed)
Anesthesia Evaluation  Patient identified by MRN, date of birth, ID band Patient awake    Reviewed: Allergy & Precautions, NPO status , Patient's Chart, lab work & pertinent test results, reviewed documented beta blocker date and time   Airway Mallampati: III       Dental   Pulmonary COPD, former smoker,           Cardiovascular hypertension, + CAD and +CHF  + dysrhythmias      Neuro/Psych negative neurological ROS  negative psych ROS   GI/Hepatic negative GI ROS, Neg liver ROS,   Endo/Other  negative endocrine ROS  Renal/GU negative Renal ROS  negative genitourinary   Musculoskeletal  (+) Arthritis , Osteoarthritis,    Abdominal   Peds negative pediatric ROS (+)  Hematology  (+) anemia ,   Anesthesia Other Findings Past Medical History: No date: Acid phosphatase elevated No date: Atrial fibrillation (HCC) No date: AV block, 1st degree No date: CHF (congestive heart failure) (HCC) No date: COPD (chronic obstructive pulmonary disease) ([place]) No date: HFrEF (heart failure with reduced ejection fraction) ([place])     Comment:  a. 04/2017 Echo: EF 30-35%, antsept HK, mild MR, mod dil               LA, mildly dil RA, mod TR, mildly to mod elev PASP; b.               08/2017 Echo: EF 30-35%, diff HK, antsept HK. Gr1 DD.               Mild to mod MR. PASP nl. No date: Hx of cervical malignancy No date: Hyperlipidemia No date: Hypertension No date: LBBB (left bundle branch block) No date: Metabolic syndrome No date: NICM (nonischemic cardiomyopathy) ([place])     Comment:  a. 04/2017 Echo: EF 30-35%; b. 04/2017 Cath: nonobs dzs;               c. 08/2017 Echo: EF 30-35%, diff HK, antsept HK. Gr1 DD.               Mild to mod MR. PASP nl. No date: Non-obstructive CAD (coronary artery disease)     Comment:  a. 04/2017 Cath: mild nonobs CAD. EF 25-35%. CO 3.14, CI               1.38. Sev PAH (61/34(47). No date: Obesity, Class  III, BMI 40-49.9 (morbid obesity) ([place]) No date: Osteoarthritis of both knees No date: PAH (pulmonary arterial hypertension) with portal  hypertension ([place])     Comment:  a. 04/2017 Right Heart Cath: Sev PAH 61/34(47); b.               08/2017 Echo: nl PASP.  Reproductive/Obstetrics                             Anesthesia Physical Anesthesia Plan  ASA: III  Anesthesia Plan: Spinal   Post-op Pain Management:  Regional for Post-op pain   Induction:   PONV Risk Score and Plan:   Airway Management Planned: Nasal Cannula  Additional Equipment:   Intra-op Plan:   Post-operative Plan:   Informed Consent: I have reviewed the patients History and Physical, chart, labs and discussed the procedure including the risks, benefits and alternatives for the proposed anesthesia with the patient or authorized representative who has indicated his/her understanding and acceptance.     Dental advisory given  Plan Discussed with: CRNA and  Surgeon  Anesthesia Plan Comments:         Anesthesia Quick Evaluation

## 2020-07-02 NOTE — Op Note (Signed)
DATE OF SURGERY:  07/02/2020 TIME: 10:58 AM  PATIENT NAME:  Kimberly Henry   AGE: 79 y.o.    PRE-OPERATIVE DIAGNOSIS:  M17.11 Unilateral primary osteoarthritis, right knee  POST-OPERATIVE DIAGNOSIS:  Same  PROCEDURE:  Procedure(s): TOTAL KNEE ARTHROPLASTY, right  SURGEON:  Lovell Sheehan, MD   ASSISTANT:  Carlynn Spry, PA-C  OPERATIVE IMPLANTS: Tamala [employer], Cruciate Retaining Oxinium Femoral component size  4, Fixed Bearing Tray size 4, Patella polyethylene 3-peg oval button size 29 mm, with a 13 mm HighFlex insert.   PREOPERATIVE INDICATIONS:  Kimberly Henry is an 79 y.o. female who has a diagnosis of M17.11 Unilateral primary osteoarthritis, right knee and elected for a right total knee arthroplasty after failing nonoperative treatment, including activity modification, pain medication, physical therapy and injections who has significant impairment of their activities of daily living.  Radiographs have demonstrated tricompartmental osteoarthritis joint space narrowing, osteophytes, subchondral sclerosis and cyst formation.  The risks, benefits, and alternatives were discussed at length including but not limited to the risks of infection, bleeding, nerve or blood vessel injury, knee stiffness, fracture, dislocation, loosening or failure of the hardware and the need for further surgery. Medical risks include but not limited to DVT and pulmonary embolism, myocardial infarction, stroke, pneumonia, respiratory failure and death. I discussed these risks with the patient in my office prior to the date of surgery. They understood these risks and were willing to proceed.  OPERATIVE FINDINGS AND UNIQUE ASPECTS OF THE CASE:  All three compartments with advanced and severe degenerative changes, large osteophytes and an abundance of synovial fluid. Significant deformity was also noted. A decision was made to proceed with total knee arthroplasty.   OPERATIVE DESCRIPTION:  The patient was  brought to the operative room and placed in a supine position after undergoing placement of a general anesthetic. IV antibiotics were given. Patient received tranexamic acid. The lower extremity was prepped and draped in the usual sterile fashion.  A time out was performed to verify the patient's name, date of birth, medical record number, correct site of surgery and correct procedure to be performed. The timeout was also used to confirm the patient received antibiotics and that appropriate instruments, implants and radiographs studies were available in the room.  The leg was elevated and exsanguinated with an Esmarch and the tourniquet was inflated to 275 mmHg.  A midline incision was made over the left knee.. A medial parapatellar arthrotomy was then made and the patella subluxed laterally and the knee was brought into 90 of flexion. Hoffa's fat pad along with the anterior cruciate ligament was resected and the medial joint line was exposed.  Attention was then turned to preparation of the patella. The thickness of the patella was measured with a caliper, the diameter measured with the patella templates.  The patella resection was then made with an oscillating saw using the patella cutting guide.  The 29 mm button fit appropriately.  3 peg holes for the patella component were then drilled.  The extramedullary tibial cutting guide was then placed using the anterior tibial crest and second ray of the foot as a reference.  The tibial cutting guide was adjusted to allow for appropriate posterior slope.  The tibial cutting block was pinned into position. The slotted stylus was used to measure the proximal tibial resection of 9 mm off the high lateral side. Care was taken during the tibial resection to protect the medial and collateral ligaments.  The resected tibial bone was removed.  The distal femur was resected using the Visionaire cutting guide.  Care was taken to protect the collateral ligaments during  distal femoral resection.  The distal femoral resection was performed with an oscillating saw. The femoral cutting guide was then removed. Extension gap was measured with a 13 mm spacer block and alignment and extension was confirmed using a long alignment rod. The femur was sized to be a 4. Rotation of the referencing guide was checked with the epicondylar axis and Whitesides line. Then the 4-in-1 cutting jig was then applied to the distal femur. A stylus was used to confirm that the anterior femur would not be notched.   Then the anterior, posterior and chamfer femoral cuts were then made with an oscillating saw.  The knee was distracted and all posterior osteophytes were removed.  The flexion gap was then measured with a flexion spacer block and long alignment rod and was found to be symmetric with the extension gap and perpendicular to mechanical axis of the tibia.  The proximal tibia plateau was then sized with trial trays. The best coverage was achieved with a size 4. This tibial tray was then pinned into position. The proximal tibia was then prepared with the keel punch.  After tibial preparation was completed, all trial components were inserted with polyethylene trials. The knee achieved full extension and flexed to 120 degrees. Ligament were stable to varus and valgus at full extension as well as 30, 60 and 90 degrees of flexion.   The trials were then placed. Knee was taken through a full range of motion and deemed to be stable with the trial components. All trial components were then removed.  The joint was copiously irrigated with pulse lavage.  The final total knee arthroplasty components were then cemented into place. The knee was held in extension while cement was allowed to cure.The knee was taken through a range of motion and the patella tracked well and the knee was again irrigated copiously.  The knee capsule was then injected with Exparel.  The medial arthrotomy was closed with #1 Vicryl  and #2 Quill. The subcutaneous tissue closed with  2-0 vicryl, and skin approximated with staples.  A dry sterile and compressive dressing was applied.  A Polar Care was applied to the operative knee.  The patient was awakened and brought to the PACU in stable and satisfactory condition.  All sharp, lap and instrument counts were correct at the conclusion the case. I spoke with the patient's family in the postop consultation room to let them know the case had been performed without complication and the patient was stable in recovery room.   Total tourniquet time was 52 minutes.

## 2020-07-02 NOTE — Evaluation (Signed)
Physical Therapy Evaluation Patient Details Name: Kimberly Henry MRN: [id number] DOB: 1940/09/21 Today's Date: 07/02/2020   History of Present Illness  Kimberly Henry is a 79 year old female with PMH of R knee pain. She has tried nonoperative treatment, including activity modification, pain medication, physical therapy and injections that has not helped her pain. She is status post R TKA on 07/02/20.    Clinical Impression  Pt presents in fowler's position on arrival to room and is agreeable to PT evaluation. Pt performed exercises in bed before mobilizing. Pt demonstrated how to use walker before sitting up. She is ModI with bed mobility as she takes increased time to sit EOB. During sit<>stand transfer, pt is minA as she pushes from the bed but needs some assistance to stand up straight. Once standing, pt had BUE support on RW. Pt felt dizzy upon standing so took a few seconds to adjust before walking. Pt walked to the recliner chair 5 feet away and pt is fatigued due to knee pain and having not eaten anything. Pt cued to keep her walker close to her for safety. Pt left in chair with all needs and encouraged to perform exercises throughout the day. Pt currently presents with deficits in strength, mobility, functional activity tolerance, and balance. Pt would benefit from skilled PT during acute stay to address aforementioned deficits and HHPT at discharge to optimize return to PLOF and maximize functional mobility. This entire session was guided, instructed, and directly supervised by Greggory Stallion, DPT.     Follow Up Recommendations Home health PT    Equipment Recommendations  Rolling walker with 5" wheels    Recommendations for Other Services OT consult     Precautions / Restrictions Precautions Precautions: Knee Precaution Booklet Issued: No Precaution Comments: Will give packet tomorrow Restrictions Weight Bearing Restrictions: Yes RLE Weight Bearing: Weight bearing as tolerated       Mobility  Bed Mobility Overal bed mobility: Modified Independent             General bed mobility comments: Patient able to perform by herself, but takes increased time for safety    Transfers Overall transfer level: Needs assistance Equipment used: Rolling walker (2 wheeled) Transfers: Sit to/from Stand Sit to Stand: Min assist         General transfer comment: Pt able to sit<>stand with minA. Pt pushes from bed but needed some assistance to stand up straight. Once standing, pt had BUE support on RW  Ambulation/Gait Ambulation/Gait assistance: Min guard Gait Distance (Feet): 5 Feet Assistive device: Rolling walker (2 wheeled) Gait Pattern/deviations: Step-to pattern;Decreased step length - right;Decreased step length - left     General Gait Details: Pt felt dizzy upon standing so took a few seconds to adjust before walking. Pt walked to the recliner chair 5 feet away and pt is fatigued due to knee pain and not having eaten anything.  Stairs            Wheelchair Mobility    Modified Rankin (Stroke Patients Only)       Balance Overall balance assessment: Needs assistance Sitting-balance support: Feet supported Sitting balance-Leahy Scale: Good Sitting balance - Comments: Pt able to maintain balance sitting EOB and in recliner chair   Standing balance support: Bilateral upper extremity supported Standing balance-Leahy Scale: Fair Standing balance comment: Pt able to maintain balance with BUE support on walker  Pertinent Vitals/Pain Pain Assessment: 0-10 Pain Score: 0-No pain (Pain increased to 4/10 with movement) Pain Location: R knee Pain Descriptors / Indicators: Aching;Sore Pain Intervention(s): Limited activity within patient's tolerance;Monitored during session;Premedicated before session    Home Living Family/patient expects to be discharged to:: Private residence Living Arrangements: Other relatives  (Lives with grandson) Available Help at Discharge: Family;Available 24 hours/day (Son took off work to help) Type of Home: House Home Access: Stairs to enter Entrance Stairs-Rails: Can reach both Technical brewer of Steps: 3 Home Layout: One level Home Equipment: [place] - single point;Shower seat;Grab bars - tub/shower      Prior Function Level of Independence: Independent with assistive device(s)         Comments: Pt ambulated with SPC. She still drives and works. She is independent with I/ADLs as she cleans and cooks herself. She notes 0 fall in last 6 months     Hand Dominance   Dominant Hand: Right    Extremity/Trunk Assessment   Upper Extremity Assessment Upper Extremity Assessment: Overall WFL for tasks assessed (Generally 5/5 UE strength)    Lower Extremity Assessment Lower Extremity Assessment: Overall WFL for tasks assessed;RLE deficits/detail (Generally 4 to 4+/5 LLE strength) RLE: Unable to fully assess due to pain       Communication   Communication: No difficulties  Cognition Arousal/Alertness: Awake/alert Behavior During Therapy: WFL for tasks assessed/performed Overall Cognitive Status: Within Functional Limits for tasks assessed                                 General Comments: Patient is A&Ox4      General Comments      Exercises Total Joint Exercises Quad Sets: AROM;Right;10 reps;Supine Hip ABduction/ADduction: AROM;10 reps;Right;Supine Straight Leg Raises: AROM;Right;10 reps;Supine Goniometric ROM: R Knee: Flexion 100; Extension: 3   Assessment/Plan    PT Assessment Patient needs continued PT services  PT Problem List Decreased strength;Decreased range of motion;Decreased activity tolerance;Decreased balance;Decreased mobility;Decreased knowledge of use of DME;Pain;Impaired sensation       PT Treatment Interventions DME instruction;Gait training;Functional mobility training;Stair training;Therapeutic  activities;Therapeutic exercise;Balance training;Neuromuscular re-education    PT Goals (Current goals can be found in the Care Plan section)  Acute Rehab PT Goals Patient Stated Goal: "to go home" PT Goal Formulation: With patient Time For Goal Achievement: 07/16/20 Potential to Achieve Goals: Good    Frequency BID   Barriers to discharge        Co-evaluation               AM-PAC PT "6 Clicks" Mobility  Outcome Measure Help needed turning from your back to your side while in a flat bed without using bedrails?: None Help needed moving from lying on your back to sitting on the side of a flat bed without using bedrails?: A Little Help needed moving to and from a bed to a chair (including a wheelchair)?: A Little Help needed standing up from a chair using your arms (e.g., wheelchair or bedside chair)?: A Little Help needed to walk in hospital room?: A Little Help needed climbing 3-5 steps with a railing? : A Little 6 Click Score: 19    End of Session Equipment Utilized During Treatment: Gait belt Activity Tolerance: Patient limited by fatigue;Patient limited by pain Patient left: in chair;with call bell/phone within reach;with nursing/sitter in room Nurse Communication: Mobility status PT Visit Diagnosis: Unsteadiness on feet (R26.81);Other abnormalities of gait and mobility (R26.89);Muscle  weakness (generalized) (M62.81);Difficulty in walking, not elsewhere classified (R26.2);Pain Pain - Right/Left: Right Pain - part of body: Knee    Time: 0447-1580 PT Time Calculation (min) (ACUTE ONLY): 45 min   Charges:              Kimberly Henry, SPT Bernita Raisin 07/02/2020, 4:57 PM

## 2020-07-02 NOTE — H&P (Signed)
The patient has been re-examined, and the chart reviewed, and there have been no interval changes to the documented history and physical.  Plan a right total knee today.  Anesthesia is consulted regarding a peripheral nerve block for post-operative pain.  The risks, benefits, and alternatives have been discussed at length, and the patient is willing to proceed.     

## 2020-07-02 NOTE — Anesthesia Procedure Notes (Signed)
Anesthesia Regional Block: Adductor canal block   Pre-Anesthetic Checklist: ,, timeout performed, Correct Patient, Correct Site, Correct Laterality, Correct Procedure, Correct Position, site marked, Risks and benefits discussed,  Surgical consent,  Pre-op evaluation,  At surgeon's request and post-op pain management  Laterality: Right  Prep: chloraprep, alcohol swabs       Needles:   Needle Type: Stimiplex      Needle Gauge: 21     Additional Needles:   Procedures:,,,, ultrasound used (permanent image in chart),,,,  Narrative:  Start time: 07/02/2020 8:30 AM End time: 07/02/2020 8:33 AM  Performed by: Personally  Anesthesiologist: Alvin Critchley, MD  Additional Notes: Time out called.  A bump was placed under the right hip.  A scout film was made about 12 cm down from the inguinal crease to visualize the appropriate structures and a line was drawn lateral to the US probe.  The area was prepped and draped and a skin wheal was made along the line with 1% Lidocaine plain.  A 21G stimuplex needle was advanced just lateral to the artery and incremental injection of 30 cc of 0.5% ropivacaine plain with negative aspirations.  The patient tolerated the procedure well with no pain or paresthesias.

## 2020-07-03 ENCOUNTER — Encounter: Payer: Self-pay | Admitting: Orthopedic Surgery

## 2020-07-03 DIAGNOSIS — Z6841 Body Mass Index (BMI) 40.0 and over, adult: Secondary | ICD-10-CM | POA: Diagnosis not present

## 2020-07-03 DIAGNOSIS — I252 Old myocardial infarction: Secondary | ICD-10-CM | POA: Diagnosis not present

## 2020-07-03 DIAGNOSIS — I428 Other cardiomyopathies: Secondary | ICD-10-CM | POA: Diagnosis present

## 2020-07-03 DIAGNOSIS — I2510 Atherosclerotic heart disease of native coronary artery without angina pectoris: Secondary | ICD-10-CM | POA: Diagnosis present

## 2020-07-03 DIAGNOSIS — M1711 Unilateral primary osteoarthritis, right knee: Secondary | ICD-10-CM | POA: Diagnosis present

## 2020-07-03 DIAGNOSIS — K766 Portal hypertension: Secondary | ICD-10-CM | POA: Diagnosis present

## 2020-07-03 DIAGNOSIS — I4891 Unspecified atrial fibrillation: Secondary | ICD-10-CM | POA: Diagnosis present

## 2020-07-03 DIAGNOSIS — Z20822 Contact with and (suspected) exposure to covid-19: Secondary | ICD-10-CM | POA: Diagnosis present

## 2020-07-03 DIAGNOSIS — E785 Hyperlipidemia, unspecified: Secondary | ICD-10-CM | POA: Diagnosis present

## 2020-07-03 DIAGNOSIS — Z79899 Other long term (current) drug therapy: Secondary | ICD-10-CM | POA: Diagnosis not present

## 2020-07-03 DIAGNOSIS — I110 Hypertensive heart disease with heart failure: Secondary | ICD-10-CM | POA: Diagnosis present

## 2020-07-03 DIAGNOSIS — Z96651 Presence of right artificial knee joint: Secondary | ICD-10-CM | POA: Diagnosis not present

## 2020-07-03 DIAGNOSIS — Z96659 Presence of unspecified artificial knee joint: Secondary | ICD-10-CM | POA: Diagnosis not present

## 2020-07-03 DIAGNOSIS — Z8541 Personal history of malignant neoplasm of cervix uteri: Secondary | ICD-10-CM | POA: Diagnosis not present

## 2020-07-03 DIAGNOSIS — I2721 Secondary pulmonary arterial hypertension: Secondary | ICD-10-CM | POA: Diagnosis present

## 2020-07-03 DIAGNOSIS — Z8249 Family history of ischemic heart disease and other diseases of the circulatory system: Secondary | ICD-10-CM | POA: Diagnosis not present

## 2020-07-03 DIAGNOSIS — Z7901 Long term (current) use of anticoagulants: Secondary | ICD-10-CM | POA: Diagnosis not present

## 2020-07-03 DIAGNOSIS — I447 Left bundle-branch block, unspecified: Secondary | ICD-10-CM | POA: Diagnosis present

## 2020-07-03 DIAGNOSIS — Z87891 Personal history of nicotine dependence: Secondary | ICD-10-CM | POA: Diagnosis not present

## 2020-07-03 DIAGNOSIS — I440 Atrioventricular block, first degree: Secondary | ICD-10-CM | POA: Diagnosis present

## 2020-07-03 DIAGNOSIS — I5022 Chronic systolic (congestive) heart failure: Secondary | ICD-10-CM | POA: Diagnosis present

## 2020-07-03 DIAGNOSIS — R001 Bradycardia, unspecified: Secondary | ICD-10-CM | POA: Diagnosis not present

## 2020-07-03 DIAGNOSIS — J449 Chronic obstructive pulmonary disease, unspecified: Secondary | ICD-10-CM | POA: Diagnosis not present

## 2020-07-03 DIAGNOSIS — E8881 Metabolic syndrome: Secondary | ICD-10-CM | POA: Diagnosis present

## 2020-07-03 LAB — BASIC METABOLIC PANEL
Anion gap: 10 (ref 5–15)
Anion gap: 10 (ref 5–15)
BUN: 21 mg/dL (ref 8–23)
BUN: 23 mg/dL (ref 8–23)
CO2: 23 mmol/L (ref 22–32)
CO2: 24 mmol/L (ref 22–32)
Calcium: 9 mg/dL (ref 8.9–10.3)
Calcium: 9.2 mg/dL (ref 8.9–10.3)
Chloride: 104 mmol/L (ref 98–111)
Chloride: 104 mmol/L (ref 98–111)
Creatinine, Ser: 0.98 mg/dL (ref 0.44–1.00)
Creatinine, Ser: 1.08 mg/dL — ABNORMAL HIGH (ref 0.44–1.00)
GFR, Estimated: 49 mL/min — ABNORMAL LOW (ref >60)
GFR, Estimated: 59 mL/min — ABNORMAL LOW (ref >60)
Glucose, Bld: 127 mg/dL — ABNORMAL HIGH (ref 70–99)
Glucose, Bld: 138 mg/dL — ABNORMAL HIGH (ref 70–99)
Potassium: 4.2 mmol/L (ref 3.5–5.1)
Potassium: 4.4 mmol/L (ref 3.5–5.1)
Sodium: 137 mmol/L (ref 135–145)
Sodium: 138 mmol/L (ref 135–145)

## 2020-07-03 LAB — CBC
HCT: 33.4 % — ABNORMAL LOW (ref 36.0–46.0)
Hemoglobin: 11.1 g/dL — ABNORMAL LOW (ref 12.0–15.0)
MCH: 31.6 pg (ref 26.0–34.0)
MCHC: 33.2 g/dL (ref 30.0–36.0)
MCV: 95.2 fL (ref 80.0–100.0)
Platelets: 287 10*3/uL (ref 150–400)
RBC: 3.51 MIL/uL — ABNORMAL LOW (ref 3.87–5.11)
RDW: 11.9 % (ref 11.5–15.5)
WBC: 10.3 10*3/uL (ref 4.0–10.5)
nRBC: 0 % (ref 0.0–0.2)

## 2020-07-03 LAB — MAGNESIUM: Magnesium: 2 mg/dL (ref 1.7–2.4)

## 2020-07-03 MED ORDER — ACETAMINOPHEN 325 MG PO TABS
ORAL_TABLET | ORAL | Status: AC
  Administered 2020-07-03: 650 mg
  Filled 2020-07-03: qty 2

## 2020-07-03 MED ORDER — ACETAMINOPHEN 500 MG PO TABS
ORAL_TABLET | ORAL | Status: AC
  Filled 2020-07-03: qty 1

## 2020-07-03 MED ORDER — KETOROLAC TROMETHAMINE 15 MG/ML IJ SOLN
INTRAMUSCULAR | Status: AC
  Administered 2020-07-03: 7.5 mg via INTRAVENOUS
  Filled 2020-07-03: qty 1

## 2020-07-03 MED ORDER — POTASSIUM CHLORIDE CRYS ER 20 MEQ PO TBCR
EXTENDED_RELEASE_TABLET | ORAL | Status: AC
  Administered 2020-07-03: 20 meq via ORAL
  Filled 2020-07-03: qty 1

## 2020-07-03 MED ORDER — HYDROCODONE-ACETAMINOPHEN 5-325 MG PO TABS
ORAL_TABLET | ORAL | Status: AC
  Administered 2020-07-03: 2 via ORAL
  Filled 2020-07-03: qty 2

## 2020-07-03 NOTE — Evaluation (Signed)
Occupational Therapy Evaluation Patient Details Name: Kimberly Henry MRN: [id number] DOB: 1941-02-18 Today's Date: 07/03/2020    History of Present Illness Kimberly Henry is a 79 year old female with PMH of R knee pain. She has tried nonoperative treatment, including activity modification, pain medication, physical therapy and injections that has not helped her pain. She is status post R TKA on 07/02/20.   Clinical Impression   Patient presenting with decreased I in self care, balance, functional transfer/mobility, endurance and safety awareness. Patient reports being independent PTA and leaves with family member. Pt has 24/7 assist at discharge as needed. Patient currently functioning at supervision - min A ( LB dressing). Pt needs min guard for functional transfer with use of RW. Pt with reports or no pain with mobility. OT reviewed and provided education regarding polar care and AE with self care. Pt verbalized understanding and paper handout provided.  Patient will benefit from acute OT to increase overall independence in the areas of ADLs, functional mobility, and safety awareness in order to safely discharge home with family.     Follow Up Recommendations  Supervision - Intermittent;No OT follow up    Equipment Recommendations  3 in 1 bedside commode    Recommendations for Other Services Other (comment) (none at this time)     Precautions / Restrictions Precautions Precautions: Knee Restrictions RLE Weight Bearing: Weight bearing as tolerated      Mobility Bed Mobility Overal bed mobility: Modified Independent             General bed mobility comments: Patient able to perform by herself, but takes increased time for safety    Transfers Overall transfer level: Needs assistance Equipment used: Rolling walker (2 wheeled) Transfers: Sit to/from Stand Sit to Stand: Min guard         General transfer comment: min cuing for technique and hand placement    Balance  Overall balance assessment: Needs assistance Sitting-balance support: Feet supported Sitting balance-Leahy Scale: Good Sitting balance - Comments: no LOB on EOB   Standing balance support: Bilateral upper extremity supported Standing balance-Leahy Scale: Fair Standing balance comment: B UE support on RW                           ADL either performed or assessed with clinical judgement   ADL Overall ADL's : Needs assistance/impaired               General ADL Comments: Set up A for UB self care, min A for LB self care, and min guard for functional transfer with use of RW     Vision Patient Visual Report: No change from baseline              Pertinent Vitals/Pain Pain Assessment: 0-10 Pain Score: 0-No pain     Hand Dominance Right   Extremity/Trunk Assessment Upper Extremity Assessment Upper Extremity Assessment: Overall WFL for tasks assessed   Lower Extremity Assessment Lower Extremity Assessment: Defer to PT evaluation   Cervical / Trunk Assessment Cervical / Trunk Assessment: Normal   Communication Communication Communication: No difficulties   Cognition Arousal/Alertness: Awake/alert Behavior During Therapy: WFL for tasks assessed/performed Overall Cognitive Status: Within Functional Limits for tasks assessed           General Comments: Patient is A&Ox4   General Comments       Exercises     Shoulder Instructions      Home Living Family/patient expects to  be discharged to:: Private residence Living Arrangements: Other relatives Available Help at Discharge: Family;Available 24 hours/day Type of Home: House Home Access: Stairs to enter [employer] of Steps: 3 Entrance Stairs-Rails: Can reach both Home Layout: One level     Bathroom Shower/Tub: Teacher, early years/pre: Handicapped height Bathroom Accessibility: Yes   Home Equipment: Cane - single point;Shower seat;Grab bars - tub/shower           Prior Functioning/Environment Level of Independence: Independent with assistive device(s)        Comments: Pt ambulated with SPC. She still drives and works. She is independent with I/ADLs as she cleans and cooks herself. She notes 0 fall in last 6 months        OT Problem List: Decreased strength;Decreased safety awareness;Decreased activity tolerance;Decreased knowledge of use of DME or AE;Impaired balance (sitting and/or standing);Decreased knowledge of precautions      OT Treatment/Interventions:      OT Goals(Current goals can be found in the care plan section) Acute Rehab OT Goals Patient Stated Goal: "to go home" OT Goal Formulation: With patient Time For Goal Achievement: 07/17/20 Potential to Achieve Goals: Good ADL Goals Pt Will Perform Grooming: with modified independence;standing Pt Will Perform Lower Body Dressing: with modified independence;sit to/from stand Pt Will Transfer to Toilet: with modified independence;stand pivot transfer Pt Will Perform Toileting - Clothing Manipulation and hygiene: with modified independence;sit to/from stand  OT Frequency:     Barriers to D/C:               AM-PAC OT "6 Clicks" Daily Activity     Outcome Measure Help from another person eating meals?: None Help from another person taking care of personal grooming?: A Little Help from another person toileting, which includes using toliet, bedpan, or urinal?: A Little Help from another person bathing (including washing, rinsing, drying)?: A Little Help from another person to put on and taking off regular upper body clothing?: A Little Help from another person to put on and taking off regular lower body clothing?: A Little 6 Click Score: 19   End of Session Equipment Utilized During Treatment: Rolling walker Nurse Communication: Mobility status  Activity Tolerance: Patient tolerated treatment well Patient left: in chair;with call bell/phone within reach  OT Visit Diagnosis:  Unsteadiness on feet (R26.81);Muscle weakness (generalized) (M62.81)                Time: 2919-1660 OT Time Calculation (min): 23 min Charges:  OT General Charges $OT Visit: 1 Visit OT Evaluation $OT Eval Low Complexity: 1 Low OT Treatments $Self Care/Home Management : 8-22 mins  Darleen Crocker, MS, OTR/L , CBIS ascom [phone]  07/03/20, 11:12 AM

## 2020-07-03 NOTE — Progress Notes (Signed)
Subjective:  Patient reports pain as mild.  Patient's heart rate dropping into 30s and 40s while sleeping over night. When awakened, heart rate rises back into the 50s. Coreg and digoxin held earlier in the day. Pt denied symptoms. States she has had low heart rate in past, at times in the 30s. Place on bedrest until a  Objective:   VITALS:   Vitals:   07/02/20 2245 07/03/20 0045 07/03/20 0245 07/03/20 0445  BP: (!) 101/45 127/64 (!) 96/53 (!) 103/54  Pulse: (!) 54 (!) 45 (!) 49 (!) 55  Resp: 16 20 20 18   Temp:      TempSrc:      SpO2: 96% 100% 97% 98%  Weight:      Height:        PHYSICAL EXAM:  Neurologically intact ABD soft Neurovascular intact Sensation intact distally Intact pulses distally Dorsiflexion/Plantar flexion intact Incision: scant drainage No cellulitis present Compartment soft  LABS  Results for orders placed or performed during the hospital encounter of 07/02/20 (from the past 24 hour(s))  ABO/Rh     Status: None   Collection Time: 07/02/20  7:37 AM  Result Value Ref Range   ABO/RH(D)      B POS Performed at [facility], [address] [place] [address]   Protime-INR     Status: None   Collection Time: 07/02/20  8:18 AM  Result Value Ref Range   Prothrombin Time 14.2 11.4 - 15.2 seconds   INR 1.1 0.8 - 1.2  Brain natriuretic peptide     Status: Abnormal   Collection Time: 07/02/20  2:02 PM  Result Value Ref Range   B Natriuretic Peptide 163.9 (H) 0.0 - 100.0 pg/mL  TSH     Status: None   Collection Time: 07/02/20  2:02 PM  Result Value Ref Range   TSH 2.882 0.350 - 4.500 uIU/mL  Basic metabolic panel     Status: Abnormal   Collection Time: 07/02/20 11:59 PM  Result Value Ref Range   Sodium 137 135 - 145 mmol/L   Potassium 4.4 3.5 - 5.1 mmol/L   Chloride 104 98 - 111 mmol/L   CO2 23 22 - 32 mmol/L   Glucose, Bld 138 (H) 70 - 99 mg/dL   BUN 21 8 - 23 mg/dL   Creatinine, Ser 1.08 (H) 0.44 - 1.00 mg/dL   Calcium 9.0  8.9 - 10.3 mg/dL   GFR, Estimated 49 (L) >60 mL/min   Anion gap 10 5 - 15  Magnesium     Status: None   Collection Time: 07/02/20 11:59 PM  Result Value Ref Range   Magnesium 2.0 1.7 - 2.4 mg/dL    DG Knee Right Port  Result Date: 07/02/2020 CLINICAL DATA:  Status post total knee replacement EXAM: PORTABLE RIGHT KNEE - 1-2 VIEW COMPARISON:  None. FINDINGS: Frontal and lateral views were obtained. Patient is status post total knee replacement with femoral and tibial prosthetic components well-seated. No fracture or dislocation. No erosive change. There are spurs along the superior patella anteriorly and posteriorly. Air within the joint is an expected postoperative finding. IMPRESSION: Status post total knee replacement with prosthetic components well-seated. No fracture or dislocation. Spurring along the anterior patella likely represents distal quadriceps and proximal patellar tendinosis. Electronically Signed   By: Lowella Grip III M.D.   On: 07/02/2020 11:33   [place] OR NERVE BLOCK-IMAGE ONLY [facility])  Result Date: 07/02/2020 There is no interpretation for this exam.  This order  is for images obtained during a surgical procedure.  Please See "Surgeries" Tab for more information regarding the procedure.    Assessment/Plan: 1 Day Post-Op   Principal Problem:   S/P TKR (total knee replacement) using cement, right Active Problems:   Hypertension, benign   Dyslipidemia   Chronic systolic heart failure (HCC)   COPD (chronic obstructive pulmonary disease) (HCC)   CAD (coronary artery disease)   Atrial fibrillation (HCC)   Bradycardia   Advance diet Up with therapy  Can come off bedrest. Restart Eliquis and other meds Hopeful D/C tomorrow WBAT RLE   Kimberly Henry , PA-C 07/03/2020, 6:59 AM

## 2020-07-03 NOTE — Progress Notes (Signed)
Pt up eating breakfast with NAD, VS. Will continue to monitor

## 2020-07-03 NOTE — Progress Notes (Signed)
PT at bedside.

## 2020-07-03 NOTE — Progress Notes (Addendum)
OT at bedside. 

## 2020-07-03 NOTE — Progress Notes (Addendum)
PT has moderate bleeding to dressing noted after ambulating with PT. Dr. Harlow Mares made aware. Per Linton Rump, [place] re-enforce if needed and they will reassess tomorrow am.

## 2020-07-03 NOTE — Progress Notes (Addendum)
PROGRESS NOTE  Kimberly Henry VWU:[id number] DOB: October 31, 1940 DOA: 07/02/2020 PCP: Steele Sizer, MD   LOS: 0 days   Brief narrative: As per HPI,  Kimberly Henry is an 79 y.o. female hypertension, hyperlipidemia, COPD, PAH, obesity, CAD, left bundle blockage,dCHF, AV block first-degree, atrial fibrillation on Eliquis, who developed bradycardia after right knee replacement surgery.  Pt underwent right total knee arthroplasty by Dr. Harlow Mares of ortho on 07/02/20 Pt tolerated procedure well.  After surgery, patient developed bradycardia with heart rate 40-50s.  Patient was asymptomatic.  Denied chest pain, shortness breath, dizziness, lightheadedness.  No palpitation.  Blood pressure 111/46, RR 18, oxygen saturation 99% on room air, temperature 96.1.  Assessment/Plan:  Principal Problem:   S/P TKR (total knee replacement) using cement, right Active Problems:   Hypertension, benign   Dyslipidemia   Chronic systolic heart failure (HCC)   COPD (chronic obstructive pulmonary disease) (HCC)   CAD (coronary artery disease)   Atrial fibrillation (HCC)   Bradycardia  S/P TKR (total knee replacement) management as per primary team.  Bradycardia:  Patient was on Coreg and digoxin.  Heart rate mostly low in the nighttime. No day time brady noted. Could be component of OSA, patient is morbidly obese.  Patient was once supposed to undergo sleep study.  He did not complete it.  Asymptomatic at this time. Coreg and digoxin on hold.  Would recommend holding digoxin on discharge. I have had a prolonged discussion with the patient about it.  She does have a cardiologist as outpatient.  She will have to discuss this with her cardiologist regarding further evaluation in the future.  Hypertension, benign Continue Lasix, Entresto, spironolactone.  Coreg on hold.   Dyslipidemia:  Continue Crestor  Chronic systolic heart failure ([place]): 2D echo on 01/22/2020 showed EF of 50 to 55%.    Compensated CHF.  Continue home Lasix, Entresto, spironolactone  COPD : Compensated at this time.  Continue albuterol as needed.  CAD.  Continue Crestor  Atrial fibrillation [facility]): Currently has slow heart rate but better.  -Hold digoxin and Coreg. On Eliquis.   DVT prophylaxis: SCDs Start: 07/02/20 1058 apixaban (ELIQUIS) tablet 5 mg   Okay for discharge home if HR>60 and is asymptomatic, hold digoxin on discharge.  She will have to follow-up with cardiology after discharge to discuss about meds.  Code Status:  Full code    Family Communication: None   Status is: Observation   Procedures:  Status post total knee replacement on the right  Antibiotics:  . None  Subjective: Today, patient was seen and examined at bedside.  Denies any chest pain, shortness of breath, fever, chills or rigor.  Has mild knee pain from surgery.  Objective: Vitals:   07/03/20 0445 07/03/20 1100  BP: (!) 103/54 115/60  Pulse: (!) 55   Resp: 18 16  Temp:  (!) 97 F (36.1 C)  SpO2: 98% 98%    Intake/Output Summary (Last 24 hours) at 07/03/2020 1333 Last data filed at 07/03/2020 1000 Gross per 24 hour  Intake 1150 ml  Output 700 ml  Net 450 ml   Filed Weights   07/02/20 0742  Weight: 115.2 kg   Body mass index is 39.78 kg/m.   Physical Exam: GENERAL: Patient is alert awake and oriented. Not in obvious distress.  Morbidly obese HENT: No scleral pallor or icterus. Pupils equally reactive to light. Oral mucosa is moist NECK: is supple, no gross swelling noted. CHEST: Clear to auscultation. No crackles or wheezes.  Diminished breath sounds bilaterally. CVS: S1 and S2 heard, no murmur. Regular rate and rhythm.  ABDOMEN: Soft, non-tender, bowel sounds are present. EXTREMITIES: Status post right knee surgery with brace.  Wiggles toes..   CNS: Cranial nerves are intact. No focal motor deficits.  SKIN: warm and dry without rashes.  Data Review: I have personally reviewed the following laboratory  data and studies,  CBC: Recent Labs  Lab 07/03/20 0814  WBC 10.3  HGB 11.1*  HCT 33.4*  MCV 95.2  PLT 932   Basic Metabolic Panel: Recent Labs  Lab 07/02/20 2359 07/03/20 0814  NA 137 138  K 4.4 4.2  CL 104 104  CO2 23 24  GLUCOSE 138* 127*  BUN 21 23  CREATININE 1.08* 0.98  CALCIUM 9.0 9.2  MG 2.0  --    Liver Function Tests: No results for input(s): AST, ALT, ALKPHOS, BILITOT, PROT, ALBUMIN in the last 168 hours. No results for input(s): LIPASE, AMYLASE in the last 168 hours. No results for input(s): AMMONIA in the last 168 hours. Cardiac Enzymes: No results for input(s): CKTOTAL, CKMB, CKMBINDEX, TROPONINI in the last 168 hours. BNP (last 3 results) Recent Labs    07/02/20 1402  BNP 163.9*    ProBNP (last 3 results) No results for input(s): PROBNP in the last 8760 hours.  CBG: No results for input(s): GLUCAP in the last 168 hours. Recent Results (from the past 240 hour(s))  SARS CORONAVIRUS 2 (TAT 6-24 HRS) Nasopharyngeal Nasopharyngeal Swab     Status: None   Collection Time: 06/30/20 11:09 AM   Specimen: Nasopharyngeal Swab  Result Value Ref Range Status   SARS Coronavirus 2 NEGATIVE NEGATIVE Final    Comment: (NOTE) SARS-CoV-2 target nucleic acids are NOT DETECTED.  The SARS-CoV-2 RNA is generally detectable in upper and lower respiratory specimens during the acute phase of infection. Negative results do not preclude SARS-CoV-2 infection, do not rule out co-infections with other pathogens, and should not be used as the sole basis for treatment or other patient management decisions. Negative results must be combined with clinical observations, patient history, and epidemiological information. The expected result is Negative.  Fact Sheet for Patients: SugarRoll.be  Fact Sheet for Healthcare Providers: https://www.woods-mathews.com/  This test is not yet approved or cleared by the [place] FDA and    has been authorized for detection and/or diagnosis of SARS-CoV-2 by FDA under an Emergency Use Authorization (EUA). This EUA will remain  in effect (meaning this test can be used) for the duration of the COVID-19 declaration under Se ction 564(b)(1) of the Act, 21 U.S.C. section 360bbb-3(b)(1), unless the authorization is terminated or revoked sooner.  Performed at [facility], [place] [address] [place], [place] [address]      Studies: DG Knee Right Port  Result Date: 07/02/2020 CLINICAL DATA:  Status post total knee replacement EXAM: PORTABLE RIGHT KNEE - 1-2 VIEW COMPARISON:  None. FINDINGS: Frontal and lateral views were obtained. Patient is status post total knee replacement with femoral and tibial prosthetic components well-seated. No fracture or dislocation. No erosive change. There are spurs along the superior patella anteriorly and posteriorly. Air within the joint is an expected postoperative finding. IMPRESSION: Status post total knee replacement with prosthetic components well-seated. No fracture or dislocation. Spurring along the anterior patella likely represents distal quadriceps and proximal patellar tendinosis. Electronically Signed   By: Lowella Grip III M.D.   On: 07/02/2020 11:33   [place] OR NERVE BLOCK-IMAGE ONLY [facility])  Result  Date: 07/02/2020 There is no interpretation for this exam.  This order is for images obtained during a surgical procedure.  Please See "Surgeries" Tab for more information regarding the procedure.      Flora Lipps, MD  [facility] 07/03/2020

## 2020-07-03 NOTE — Anesthesia Postprocedure Evaluation (Signed)
Anesthesia Post Note  Patient: [place]  Procedure(s) Performed: TOTAL KNEE ARTHROPLASTY (Right Knee)  Patient location during evaluation: Nursing Unit Anesthesia Type: Spinal Level of consciousness: awake, awake and alert, oriented and patient cooperative Pain management: pain level controlled Vital Signs Assessment: post-procedure vital signs reviewed and stable Respiratory status: spontaneous breathing, nonlabored ventilation and respiratory function stable Cardiovascular status: stable Anesthetic complications: no   No complications documented.   Last Vitals:  Vitals:   07/03/20 0245 07/03/20 0445  BP: (!) 96/53 (!) 103/54  Pulse: (!) 49 (!) 55  Resp: 20 18  Temp:    SpO2: 97% 98%    Last Pain:  Vitals:   07/03/20 0445  TempSrc:   PainSc: Asleep                 Malachi Kinzler,  [facility]

## 2020-07-03 NOTE — Progress Notes (Signed)
Cardiac consult being completed at bedside at this time

## 2020-07-03 NOTE — Progress Notes (Signed)
Per Linton Rump, [place] with Dr. Harlow Mares d/c bedrest today for PT

## 2020-07-03 NOTE — Progress Notes (Signed)
Lab called to collect 0500 labs for today.

## 2020-07-03 NOTE — Progress Notes (Signed)
Physical Therapy Treatment Patient Details Name: Kimberly Henry MRN: [id number] DOB: September 02, 1941 Today's Date: 07/03/2020    History of Present Illness Kimberly Henry is a 79 year old female with PMH of R knee pain. She has tried nonoperative treatment, including activity modification, pain medication, physical therapy and injections that has not helped her pain. She is status post R TKA on 07/02/20.    PT Comments    Pt presents sidelying towards R side on arrival to room. Pt is agreeable to PT session. She notes 9/10 soreness in R knee so performed bed level exercises only. Pt instructed on last 2 exercises in packet and pt verbalized understanding. Pt reports having a few HHPT sessions at home before transitioning to outpatient PT. Pt has met goals for discharge. Pt continues to benefit from skilled PT services to maximize return to PLOF and minimize risk of future falls, injury, caregiver burden, and readmission. Will continue to follow POC. Discharge recommendation remains appropriate.   Follow Up Recommendations  Home health PT     Equipment Recommendations  Rolling walker with 5" wheels    Recommendations for Other Services       Precautions / Restrictions Precautions Precautions: Knee Precaution Booklet Issued: Yes (comment) Precaution Comments: Pt given packet with PT exercises Restrictions Weight Bearing Restrictions: Yes RLE Weight Bearing: Weight bearing as tolerated    Mobility  Bed Mobility               General bed mobility comments: Did not assess as patient did not perform any rolling and stayed in bed throughout session  Transfers                 General transfer comment: Did not assess as patient stayed in bed during session due to knee soreness  Ambulation/Gait             General Gait Details: Did not assess as patient stayed in bed during session due to knee soreness   Stairs             Wheelchair Mobility    Modified  Rankin (Stroke Patients Only)       Balance       Sitting balance - Comments: Did not assess due to knee soreness       Standing balance comment: Did not assess due to knee soreness                            Cognition Arousal/Alertness: Awake/alert Behavior During Therapy: WFL for tasks assessed/performed Overall Cognitive Status: Within Functional Limits for tasks assessed                                        Exercises Total Joint Exercises Heel Slides: Right;10 reps;Supine;AAROM Hip ABduction/ADduction: AROM;Right;10 reps;Supine    General Comments General comments (skin integrity, edema, etc.): Pt had bleeding from bandage site. Nursing notified      Pertinent Vitals/Pain Pain Assessment: 0-10 Pain Score: 9  Pain Location: R knee Pain Descriptors / Indicators: Aching;Sore Pain Intervention(s): Limited activity within patient's tolerance;Monitored during session    Home Living                      Prior Function            PT Goals (current goals can now be found  in the care plan section) Acute Rehab PT Goals Patient Stated Goal: "to go home" PT Goal Formulation: With patient Time For Goal Achievement: 07/16/20 Potential to Achieve Goals: Good Progress towards PT goals: Progressing toward goals    Frequency    BID      PT Plan Current plan remains appropriate    Co-evaluation              AM-PAC PT "6 Clicks" Mobility   Outcome Measure  Help needed turning from your back to your side while in a flat bed without using bedrails?: None Help needed moving from lying on your back to sitting on the side of a flat bed without using bedrails?: A Little Help needed moving to and from a bed to a chair (including a wheelchair)?: A Little Help needed standing up from a chair using your arms (e.g., wheelchair or bedside chair)?: A Little Help needed to walk in hospital room?: A Little Help needed climbing 3-5  steps with a railing? : A Little 6 Click Score: 19    End of Session   Activity Tolerance: Patient limited by fatigue;Patient limited by pain Patient left: in bed;with call bell/phone within reach;with nursing/sitter in room Nurse Communication: Mobility status PT Visit Diagnosis: Unsteadiness on feet (R26.81);Other abnormalities of gait and mobility (R26.89);Muscle weakness (generalized) (M62.81);Difficulty in walking, not elsewhere classified (R26.2);Pain Pain - Right/Left: Right Pain - part of body: Knee     Time: 7793-9688 PT Time Calculation (min) (ACUTE ONLY): 10 min  Charges:                         Noemi Chapel, SPT Bernita Raisin 07/03/2020, 4:07 PM

## 2020-07-03 NOTE — TOC Progression Note (Addendum)
Transition of Care [facility]) - Progression Note    Patient Details  Name: Kimberly Henry MRN: [id number] Date of Birth: [birth year]  Transition of Care [facility]) CM/SW [place], [place] Phone Number:  [phone] 07/03/2020, 9:12 AM  Clinical Narrative:     CSW reached out to PO/RN and she confirmed the patient has a walker at home and will not require a new walker.  Pending PT/OT recommendations.       Expected Discharge Plan and Services                                                 Social Determinants of Health (SDOH) Interventions    Readmission Risk Interventions No flowsheet data found.

## 2020-07-03 NOTE — Progress Notes (Addendum)
Maurice, PA at bedside to change dressing at this time. This RN informed that pt is still bedrest until speaking with Dr. Harlow Mares this am.

## 2020-07-03 NOTE — Progress Notes (Signed)
Physical Therapy Treatment Patient Details Name: Kimberly Henry MRN: [id number] DOB: 1941/07/23 Today's Date: 07/03/2020    History of Present Illness Kimberly Henry is a 79 year old female with PMH of R knee pain. She has tried nonoperative treatment, including activity modification, pain medication, physical therapy and injections that has not helped her pain. She is status post R TKA on 07/02/20.    PT Comments    Pt presents seated in chair with nursing finishing up on arrival to room. Pt is agreeable to PT session. Pt performed exercises in chair before mobilizing. She is CGA for sit<>stand transfers for safety. Pt needs min to mod VCs for proper hand placement and technique. She performed 4 stairs CGA with heavy use of BUE on rails. Pt demonstrated ER in BLE going up and down stairs. She ambulated 1000+ feet with good step through pattern and step length. She had heavy use of BUE support on RW and a wide base of support when walking. She notes 0/10 pain at rest but soreness throughout session with activities. Pt educated about how to perform stairs, car transfers, and weight bearing restrictions. Pt continues to benefit from skilled PT services to maximize return to PLOF and minimize risk of future falls, injury, caregiver burden, and readmission. Will continue to follow POC. Discharge recommendation remains appropriate. This entire session was guided, instructed, and directly supervised by Greggory Stallion, DPT.    Follow Up Recommendations  Home health PT     Equipment Recommendations  Rolling walker with 5" wheels    Recommendations for Other Services       Precautions / Restrictions Precautions Precautions: Knee Precaution Booklet Issued: Yes (comment) Precaution Comments: Pt given packet with PT exercises Restrictions Weight Bearing Restrictions: Yes RLE Weight Bearing: Weight bearing as tolerated    Mobility  Bed Mobility Overal bed mobility: Modified Independent              General bed mobility comments: Did not assess as patient seated in chair before and after session  Transfers Overall transfer level: Needs assistance Equipment used: Rolling walker (2 wheeled) Transfers: Sit to/from Stand Sit to Stand: Min guard         General transfer comment: Pt needs min to mod cuing for technique and hand placement  Ambulation/Gait Ambulation/Gait assistance: Min guard Gait Distance (Feet): 1000 Feet Assistive device: Rolling walker (2 wheeled) Gait Pattern/deviations: Step-through pattern;Wide base of support     General Gait Details: Pt walks with good step through pattern and step length with heavy use of BUE support on RW. She also has a wide base of support when walking   Stairs Stairs: Yes Stairs assistance: Min guard Stair Management: Two rails;Step to pattern Number of Stairs: 4 General stair comments: Patient able to perform stairs with heavy use of UE on rails. Pt demonstrated ER in BLE both up and down stairs.   Wheelchair Mobility    Modified Rankin (Stroke Patients Only)       Balance Overall balance assessment: Needs assistance Sitting-balance support: Feet supported Sitting balance-Leahy Scale: Good Sitting balance - Comments: Pt able to maintain balance sitting in recliner   Standing balance support: Bilateral upper extremity supported Standing balance-Leahy Scale: Fair Standing balance comment: B UE support on RW                            Cognition Arousal/Alertness: Awake/alert Behavior During Therapy: WFL for tasks assessed/performed Overall Cognitive Status:  Within Functional Limits for tasks assessed                                 General Comments: Patient is A&Ox4      Exercises Total Joint Exercises Ankle Circles/Pumps: AROM;10 reps;Right;Seated Quad Sets: AROM;Right;10 reps;Seated Short Arc Quad: AROM;Right;10 reps;Seated Heel Slides: AROM;Right;10 reps;Seated Hip  ABduction/ADduction: AROM;10 reps;Right;Seated Straight Leg Raises: AROM;Right;10 reps;Seated Goniometric ROM: R Knee flexion: Flexion: 93, Extension: 5    General Comments        Pertinent Vitals/Pain Pain Assessment: No/denies pain Pain Score: 0-No pain Pain Location: R knee Pain Intervention(s): Monitored during session;Premedicated before session;Ice applied    Home Living Family/patient expects to be discharged to:: Private residence Living Arrangements: Other relatives Available Help at Discharge: Family;Available 24 hours/day Type of Home: House Home Access: Stairs to enter Entrance Stairs-Rails: Can reach both Home Layout: One level Home Equipment: Cane - single point;Shower seat;Grab bars - tub/shower      Prior Function Level of Independence: Independent with assistive device(s)      Comments: Pt ambulated with SPC. She still drives and works. She is independent with I/ADLs as she cleans and cooks herself. She notes 0 fall in last 6 months   PT Goals (current goals can now be found in the care plan section) Acute Rehab PT Goals Patient Stated Goal: "to go home" PT Goal Formulation: With patient Time For Goal Achievement: 07/16/20 Potential to Achieve Goals: Good Progress towards PT goals: Progressing toward goals    Frequency    BID      PT Plan Current plan remains appropriate    Co-evaluation              AM-PAC PT "6 Clicks" Mobility   Outcome Measure  Help needed turning from your back to your side while in a flat bed without using bedrails?: None Help needed moving from lying on your back to sitting on the side of a flat bed without using bedrails?: A Little Help needed moving to and from a bed to a chair (including a wheelchair)?: A Little Help needed standing up from a chair using your arms (e.g., wheelchair or bedside chair)?: A Little Help needed to walk in hospital room?: A Little Help needed climbing 3-5 steps with a railing? : A  Little 6 Click Score: 19    End of Session Equipment Utilized During Treatment: Gait belt Activity Tolerance: Patient limited by fatigue;Patient tolerated treatment well Patient left: in chair;with call bell/phone within reach;with nursing/sitter in room Nurse Communication: Mobility status PT Visit Diagnosis: Unsteadiness on feet (R26.81);Other abnormalities of gait and mobility (R26.89);Muscle weakness (generalized) (M62.81);Difficulty in walking, not elsewhere classified (R26.2);Pain Pain - Right/Left: Right Pain - part of body: Knee     Time: 8280-0349 PT Time Calculation (min) (ACUTE ONLY): 40 min  Charges:                         Noemi Chapel, SPT Bernita Raisin 07/03/2020, 11:46 AM

## 2020-07-03 NOTE — OR Nursing (Signed)
Patient taken to room 115.  Report given to [facility], R.N. at 2130.

## 2020-07-04 LAB — CBC
HCT: 32.1 % — ABNORMAL LOW (ref 36.0–46.0)
Hemoglobin: 10.6 g/dL — ABNORMAL LOW (ref 12.0–15.0)
MCH: 32.2 pg (ref 26.0–34.0)
MCHC: 33 g/dL (ref 30.0–36.0)
MCV: 97.6 fL (ref 80.0–100.0)
Platelets: 258 10*3/uL (ref 150–400)
RBC: 3.29 MIL/uL — ABNORMAL LOW (ref 3.87–5.11)
RDW: 12.2 % (ref 11.5–15.5)
WBC: 9.5 10*3/uL (ref 4.0–10.5)
nRBC: 0 % (ref 0.0–0.2)

## 2020-07-04 LAB — GLUCOSE, CAPILLARY: Glucose-Capillary: 135 mg/dL — ABNORMAL HIGH (ref 70–99)

## 2020-07-04 MED ORDER — HYDROCODONE-ACETAMINOPHEN 5-325 MG PO TABS
1.0000 | ORAL_TABLET | ORAL | 0 refills | Status: DC | PRN
Start: 2020-07-04 — End: 2020-10-29

## 2020-07-04 MED ORDER — METHOCARBAMOL 500 MG PO TABS: 500.0000 mg | ORAL_TABLET | Freq: Four times a day (QID) | ORAL | 1 refills | Status: DC | PRN

## 2020-07-04 MED ORDER — DOCUSATE SODIUM 100 MG PO CAPS
100.0000 mg | ORAL_CAPSULE | Freq: Two times a day (BID) | ORAL | 0 refills | Status: DC
Start: 2020-07-04 — End: 2021-01-07

## 2020-07-04 MED ORDER — ONDANSETRON HCL 4 MG PO TABS: 4.0000 mg | ORAL_TABLET | Freq: Four times a day (QID) | ORAL | 0 refills | Status: DC | PRN

## 2020-07-04 NOTE — Discharge Summary (Signed)
Physician Discharge Summary  Patient ID: Kimberly Henry MRN: [id number] DOB/AGE: November 30, 1940 79 y.o.  Admit date: 07/02/2020 Discharge date: 07/04/2020  Admission Diagnoses:  M17.11 Unilateral primary osteoarthritis, right knee S/P TKR (total knee replacement) using cement, right  Discharge Diagnoses:  M17.11 Unilateral primary osteoarthritis, right knee Principal Problem:   S/P TKR (total knee replacement) using cement, right Active Problems:   Hypertension, benign   Dyslipidemia   Chronic systolic heart failure (HCC)   COPD (chronic obstructive pulmonary disease) (HCC)   CAD (coronary artery disease)   Atrial fibrillation (HCC)   Bradycardia   S/P total knee arthroplasty   Past Medical History:  Diagnosis Date  . Acid phosphatase elevated   . Atrial fibrillation ([place])   . AV block, 1st degree   . CHF (congestive heart failure) ([place])   . COPD (chronic obstructive pulmonary disease) ([place])   . HFrEF (heart failure with reduced ejection fraction) ([place])    a. 04/2017 Echo: EF 30-35%, antsept HK, mild MR, mod dil LA, mildly dil RA, mod TR, mildly to mod elev PASP; b. 08/2017 Echo: EF 30-35%, diff HK, antsept HK. Gr1 DD. Mild to mod MR. PASP nl.  . Hx of cervical malignancy   . Hyperlipidemia   . Hypertension   . LBBB (left bundle branch block)   . Metabolic syndrome   . NICM (nonischemic cardiomyopathy) ([place])    a. 04/2017 Echo: EF 30-35%; b. 04/2017 Cath: nonobs dzs; c. 08/2017 Echo: EF 30-35%, diff HK, antsept HK. Gr1 DD. Mild to mod MR. PASP nl.  . Non-obstructive CAD (coronary artery disease)    a. 04/2017 Cath: mild nonobs CAD. EF 25-35%. CO 3.14, CI 1.38. Sev PAH [61/34(47].  . Obesity, Class III, BMI 40-49.9 (morbid obesity) ([place])   . Osteoarthritis of both knees   . PAH (pulmonary arterial hypertension) with portal hypertension ([place])    a. 04/2017 Right Heart Cath: Sev PAH 61/34(47); b. 08/2017 Echo: nl PASP.    Surgeries: Procedure(s): TOTAL KNEE ARTHROPLASTY on  07/02/2020   Consultants (if any): Treatment Team:  Flora Lipps, MD  Discharged Condition: Improved  Hospital Course: Kimberly Henry is an 79 y.o. female who was admitted 07/02/2020 with a diagnosis of  M17.11 Unilateral primary osteoarthritis, right knee S/P TKR (total knee replacement) using cement, right and went to the operating room on 07/02/2020 and underwent the above named procedures.    She was given perioperative antibiotics:  Anti-infectives (From admission, onward)   Start     Dose/Rate Route Frequency Ordered Stop   07/02/20 1500  ceFAZolin (ANCEF) IVPB 2g/100 mL premix        2 g 200 mL/hr over 30 Minutes Intravenous Every 6 hours 07/02/20 1300 07/02/20 2143   07/02/20 1100  ceFAZolin (ANCEF) IVPB 2g/100 mL premix  Status:  Discontinued        2 g 200 mL/hr over 30 Minutes Intravenous Every 6 hours 07/02/20 1057 07/02/20 1300   07/02/20 0711  ceFAZolin (ANCEF) 2-4 GM/100ML-% IVPB       Note to Pharmacy: Olena Mater   : cabinet override      07/02/20 0711 07/02/20 1536   07/02/20 0600  ceFAZolin (ANCEF) IVPB 2g/100 mL premix        2 g 200 mL/hr over 30 Minutes Intravenous On call to O.R. 07/01/20 2322 07/02/20 0900    .  She was given sequential compression devices, early ambulation, and Eliquis for DVT prophylaxis.  She benefited maximally from the hospital stay and  there were no complications.    Recent vital signs:  Vitals:   07/04/20 0446 07/04/20 0750  BP: (!) 154/66 (!) 136/46  Pulse: 66 (!) 46  Resp: 18 16  Temp: 97.7 F (36.5 C) 97.7 F (36.5 C)  SpO2: 100% 92%    Recent laboratory studies:  Lab Results  Component Value Date   HGB 10.6 (L) 07/04/2020   HGB 11.1 (L) 07/03/2020   HGB 11.4 (L) 06/20/2020   Lab Results  Component Value Date   WBC 9.5 07/04/2020   PLT 258 07/04/2020   Lab Results  Component Value Date   INR 1.1 07/02/2020   Lab Results  Component Value Date   NA 138 07/03/2020   K 4.2 07/03/2020   CL 104  07/03/2020   CO2 24 07/03/2020   BUN 23 07/03/2020   CREATININE 0.98 07/03/2020   GLUCOSE 127 (H) 07/03/2020    Discharge Medications:   Allergies as of 07/04/2020   No Known Allergies     Medication List    TAKE these medications   acetaminophen 500 MG tablet Commonly known as: TYLENOL Take 1,000 mg by mouth every 6 (six) hours as needed for pain.   carvedilol 6.25 MG tablet Commonly known as: COREG TAKE 1 TABLET BY MOUTH  TWICE DAILY WITH MEALS   digoxin 0.125 MG tablet Commonly known as: LANOXIN TAKE 1 TABLET BY MOUTH  DAILY   docusate sodium 100 MG capsule Commonly known as: COLACE Take 1 capsule (100 mg total) by mouth 2 (two) times daily.   Eliquis 5 MG Tabs tablet Generic drug: apixaban TAKE 1 TABLET BY MOUTH  TWICE DAILY What changed: how much to take   Entresto 97-103 MG Generic drug: sacubitril-valsartan Take 1 tablet by mouth 2 (two) times daily.   furosemide 40 MG tablet Commonly known as: LASIX TAKE 1 TABLET BY MOUTH  DAILY What changed: how to take this   HYDROcodone-acetaminophen 5-325 MG tablet Commonly known as: NORCO/VICODIN Take 1 tablet by mouth every 4 (four) hours as needed for moderate pain (pain score 4-6).   levocetirizine 5 MG tablet Commonly known as: Xyzal Take 1 tablet (5 mg total) by mouth every evening.   methocarbamol 500 MG tablet Commonly known as: ROBAXIN Take 1 tablet (500 mg total) by mouth every 6 (six) hours as needed for muscle spasms.   ondansetron 4 MG tablet Commonly known as: ZOFRAN Take 1 tablet (4 mg total) by mouth every 6 (six) hours as needed for nausea.   potassium chloride SA 20 MEQ tablet Commonly known as: KLOR-CON Take 1 tablet (20 mEq total) by mouth daily. Must make appointment for further refills What changed: additional instructions   rosuvastatin 40 MG tablet Commonly known as: CRESTOR Take 1 tablet (40 mg total) by mouth daily. In place of Atorvastatin What changed: additional  instructions   spironolactone 25 MG tablet Commonly known as: ALDACTONE TAKE 1 TABLET BY MOUTH  DAILY What changed: how much to take            Durable Medical Equipment  (From admission, onward)         Start     Ordered   07/02/20 1058  DME Walker rolling  Once       Question Answer Comment  Walker: With [place] Wheels   Patient needs a walker to treat with the following condition S/P TKR (total knee replacement) using cement, right      07/02/20 1057  Diagnostic Studies: DG Knee Right Port  Result Date: 07/02/2020 CLINICAL DATA:  Status post total knee replacement EXAM: PORTABLE RIGHT KNEE - 1-2 VIEW COMPARISON:  None. FINDINGS: Frontal and lateral views were obtained. Patient is status post total knee replacement with femoral and tibial prosthetic components well-seated. No fracture or dislocation. No erosive change. There are spurs along the superior patella anteriorly and posteriorly. Air within the joint is an expected postoperative finding. IMPRESSION: Status post total knee replacement with prosthetic components well-seated. No fracture or dislocation. Spurring along the anterior patella likely represents distal quadriceps and proximal patellar tendinosis. Electronically Signed   By: Lowella Grip III M.D.   On: 07/02/2020 11:33   [place] OR NERVE BLOCK-IMAGE ONLY [facility])  Result Date: 07/02/2020 There is no interpretation for this exam.  This order is for images obtained during a surgical procedure.  Please See "Surgeries" Tab for more information regarding the procedure.    Disposition:        Signed: Carlynn Spry ,PA-C 07/04/2020, 8:31 AM

## 2020-07-04 NOTE — Progress Notes (Signed)
  Subjective:  Patient reports pain as mild.  Doing well asymptomatic. Bleeding from incision over night. Eliquis held.  Objective:   VITALS:   Vitals:   07/03/20 2132 07/04/20 0016 07/04/20 0446 07/04/20 0750  BP: (!) 142/54 (!) 108/48 (!) 154/66 (!) 136/46  Pulse: (!) 56 (!) 48 66 (!) 46  Resp: 20 15 18 16   Temp: 98.4 F (36.9 C) 98 F (36.7 C) 97.7 F (36.5 C) 97.7 F (36.5 C)  TempSrc:   Oral Oral  SpO2: 98% 98% 100% 92%  Weight:      Height:        PHYSICAL EXAM:  Neurologically intact ABD soft Neurovascular intact Sensation intact distally Intact pulses distally Dorsiflexion/Plantar flexion intact Incision: moderate drainage No cellulitis present Compartment soft dressing changed  LABS  Results for orders placed or performed during the hospital encounter of 07/02/20 (from the past 24 hour(s))  Glucose, capillary     Status: Abnormal   Collection Time: 07/04/20 12:15 AM  Result Value Ref Range   Glucose-Capillary 135 (H) 70 - 99 mg/dL  CBC     Status: Abnormal   Collection Time: 07/04/20  4:56 AM  Result Value Ref Range   WBC 9.5 4.0 - 10.5 K/uL   RBC 3.29 (L) 3.87 - 5.11 MIL/uL   Hemoglobin 10.6 (L) 12.0 - 15.0 g/dL   HCT 32.1 (L) 36 - 46 %   MCV 97.6 80.0 - 100.0 fL   MCH 32.2 26.0 - 34.0 pg   MCHC 33.0 30.0 - 36.0 g/dL   RDW 12.2 11.5 - 15.5 %   Platelets 258 150 - 400 K/uL   nRBC 0.0 0.0 - 0.2 %    DG Knee Right Port  Result Date: 07/02/2020 CLINICAL DATA:  Status post total knee replacement EXAM: PORTABLE RIGHT KNEE - 1-2 VIEW COMPARISON:  None. FINDINGS: Frontal and lateral views were obtained. Patient is status post total knee replacement with femoral and tibial prosthetic components well-seated. No fracture or dislocation. No erosive change. There are spurs along the superior patella anteriorly and posteriorly. Air within the joint is an expected postoperative finding. IMPRESSION: Status post total knee replacement with prosthetic components  well-seated. No fracture or dislocation. Spurring along the anterior patella likely represents distal quadriceps and proximal patellar tendinosis. Electronically Signed   By: Lowella Grip III M.D.   On: 07/02/2020 11:33    Assessment/Plan: 2 Days Post-Op   Principal Problem:   S/P TKR (total knee replacement) using cement, right Active Problems:   Hypertension, benign   Dyslipidemia   Chronic systolic heart failure (HCC)   COPD (chronic obstructive pulmonary disease) (HCC)   CAD (coronary artery disease)   Atrial fibrillation (HCC)   Bradycardia   S/P total knee arthroplasty   Advance diet Up with therapy Discharge home with home health  May restart E[place] this am. Discharge after PT   Carlynn Spry , PA-C 07/04/2020, 8:17 AM

## 2020-07-04 NOTE — TOC Initial Note (Signed)
Transition of Care [facility]) - Initial/Assessment Note    Patient Details  Name: Kimberly Henry MRN: [id number] Date of Birth: [birth year]  [facility]) CM/SW Contact:    Shelbie Hutching, RN Phone Number: 07/04/2020, 10:42 AM  Clinical Narrative:                 Patient admitted to the hospital S/P total knee replacement, right knee.  Patient is doing well and should be discharged home today.  Patient will need home health PT.  Drue Novel with [facility] given referral and she will see if they can accept.  Patient is from home and lives alone.  Patient's son will be picking her up at discharge and he will be helping her out at home.  Patient needs a rolling walker and 3 in 1.  [employer] given referral for both and they will be delivered to the room.  Patient is current with her PCP and uses [facility] for Prescriptions.    Expected Discharge Plan: [place] Barriers to Discharge: No Barriers Identified   Patient Goals and CMS Choice Patient states their goals for this hospitalization and ongoing recovery are:: Patient glad to be going home CMS Medicare.gov Compare Post Acute Care list provided to:: Patient Choice offered to / list presented to : Patient  Expected Discharge Plan and Services Expected Discharge Plan: [place]   Discharge Planning Services: CM Consult Post Acute Care Choice: [place] arrangements for the past 2 months: [place]                 DME Arranged: 3-N-1, Walker rolling DME Agency: [employer] Date DME Agency Contacted: 07/04/20 Time DME Agency Contacted: [id number] Representative spoke with at DME Agency: Mardene Celeste HH Arranged: PT [place]: [facility] (formerly [employer])        Prior Living Arrangements/Services Living arrangements for the past 2 months: [place] with:: Self   Do you feel safe going back to the place where you live?: Yes      Need for Family  Participation in Patient Care: Yes (Comment) (TKR) Care giver support system in place?: Yes (comment) (son)   Criminal Activity/Legal Involvement Pertinent to Current Situation/Hospitalization: No - Comment as needed  Activities of Daily Living Home Assistive Devices/Equipment: Cane (specify quad or straight) ADL Screening (condition at time of admission) Patient's cognitive ability adequate to safely complete daily activities?: Yes Is the patient deaf or have difficulty hearing?: No Does the patient have difficulty seeing, even when wearing glasses/contacts?: No Does the patient have difficulty concentrating, remembering, or making decisions?: No Patient able to express need for assistance with ADLs?: Yes Does the patient have difficulty dressing or bathing?: Yes Independently performs ADLs?: No Communication: Needs assistance Is this a change from baseline?: Change from baseline, expected to last <3 days Dressing (OT): Needs assistance Is this a change from baseline?: Change from baseline, expected to last <3days Grooming: Needs assistance Is this a change from baseline?: Change from baseline, expected to last <3 days Feeding: Independent Bathing: Needs assistance Is this a change from baseline?: Change from baseline, expected to last <3 days Toileting: Needs assistance Is this a change from baseline?: Change from baseline, expected to last <3 days In/Out Bed: Needs assistance Is this a change from baseline?: Change from baseline, expected to last <3 days Walks in Home: Needs assistance Is this a change from baseline?: Change from baseline, expected to  last >3 days Does the patient have difficulty walking or climbing stairs?: Yes Weakness of Legs: Right Weakness of Arms/Hands: None  Permission Sought/Granted Permission sought to share information with : Case Manager, Other (comment) Permission granted to share information with : Yes, Verbal Permission Granted     Permission  granted to share info w AGENCY: Home health agency        Emotional Assessment Appearance:: Appears stated age Attitude/Demeanor/Rapport: Engaged Affect (typically observed): Accepting Orientation: : Oriented to Self, Oriented to Place, Oriented to  Time, Oriented to Situation Alcohol / Substance Use: Not Applicable Psych Involvement: No (comment)  Admission diagnosis:  S/P TKR (total knee replacement) using cement, right [Z96.651] S/P total knee arthroplasty [Z96.659] Patient Active Problem List   Diagnosis Date Noted  . S/P total knee arthroplasty 07/03/2020  . S/P TKR (total knee replacement) using cement, right 07/02/2020  . COPD (chronic obstructive pulmonary disease) ([place])   . CAD (coronary artery disease)   . Atrial fibrillation ([place])   . Bradycardia   . Lung nodule seen on imaging study 01/09/2020  . NICM (nonischemic cardiomyopathy) ([place]) 01/09/2020  . Ventricular tachycardia ([place]) 07/18/2018  . Morbid obesity with BMI of 40.0-44.9, adult ([place]) 07/18/2018  . Chronic systolic heart failure ([place]) 04/28/2017  . Hypokalemia 04/19/2017  . Elevated troponin 04/19/2017  . Hyperglycemia 04/19/2017  . Aortic atherosclerosis ([place]) 03/10/2017  . Anemia, unspecified 03/08/2017  . Cardiomegaly 03/04/2017  . Perennial allergic rhinitis with seasonal variation 05/30/2015  . 1st degree AV block 03/25/2015  . Morbid (severe) obesity due to excess calories ([place]) 03/25/2015  . Metabolic syndrome 28/76/8115  . History of cervical cancer 03/03/2015  . Hypertension, benign 03/03/2015  . Osteoarthritis, knee 03/03/2015  . Dyslipidemia 03/03/2015   PCP:  Steele Sizer, MD Pharmacy:   [place], [address], [address] [place], [address] [place] [address] Phone: [phone] Fax: [phone]  [facility] [place], [place] - [address] [address] Phone: [phone]  Fax: [place], [place] - [address] AT [facility] [address] [place] [address] Phone: [phone] Fax: [phone]     Social Determinants of Health ([place]) Interventions    Readmission Risk Interventions No flowsheet data found.

## 2020-07-04 NOTE — TOC Transition Note (Signed)
Transition of Care [facility]) - CM/SW Discharge Note   Patient Details  Name: Kimberly Henry MRN: [id number] Date of Birth: 11-Jan-1941  Transition of Care [facility]) CM/SW Contact:  Shelbie Hutching, RN Phone Number: 07/04/2020, 11:00 AM   Clinical Narrative:    Patient is medically cleared for discharge home today with home health PT.  [place] with [facility] has accepted referral for home health PT and they will be able to go out and see patient on Monday.  Walker and 3 in 1 delivered to the patient's room.    Final next level of care: [place] Barriers to Discharge: No Barriers Identified   Patient Goals and CMS Choice Patient states their goals for this hospitalization and ongoing recovery are:: Patient glad to be going home CMS Medicare.gov Compare Post Acute Care list provided to:: Patient Choice offered to / list presented to : Patient  Discharge Placement                       Discharge Plan and Services   Discharge Planning Services: CM Consult Post Acute Care Choice: Home Health          DME Arranged: 3-N-1, Walker rolling DME Agency: [employer] Date DME Agency Contacted: 07/04/20 Time DME Agency Contacted: [id number] Representative spoke with at DME Agency: Mardene Celeste HH Arranged: PT [place]: [facility] [place] Date Level [place]: 07/04/20 Time [place]: 1059 Representative spoke with at [place]: [place] ([place]) Interventions     Readmission Risk Interventions No flowsheet data found.

## 2020-07-04 NOTE — Discharge Instructions (Signed)
Continue weight bear as tolerated on the right lower extremity.    Elevate the right lower extremity whenever possible and continue the polar care while elevating the extremity. Patient may shower. No bath or submerging the wound.    Continue Eliquis as directed for blood clot prevention.  Continue to work on knee range of motion exercises at home as instructed by physical therapy. Continue to use a walker for assistance with ambulation until cleared by physical therapy.  Call [phone] with any questions, such as fever > 101.5 degrees, drainage from the wound or shortness of breath.

## 2020-07-04 NOTE — Progress Notes (Signed)
Physical Therapy Treatment Patient Details Name: Kimberly Henry MRN: [id number] DOB: 11-06-1940 Today's Date: 07/04/2020    History of Present Illness Kimberly Henry is a 79 year old female with PMH of R knee pain. She has tried nonoperative treatment, including activity modification, pain medication, physical therapy and injections that has not helped her pain. She is status post R TKA on 07/02/20.    PT Comments    Pt ready for session.  Stated she has had pain medication but increased soreness persists.  Participated in exercises as described below.  To EOB with rail but no assist and increased time.  Sitting balance good.  Stood from raised bed with RW and min a x 1.  She is able to walk in room with slow steady gait but self limits due to pain.  Remained in recliner after session.  She stated she feels she is safe and ready to go home.  Has a lift chair at home to assist with transfers but requests BSC and walker.  Stated she will have somebody home with her at discharge.  While mobility is decreased today she demonstrated safe ambulation on level and stairs yesterday.  Education provided regarding daily mobility expectations.    Follow Up Recommendations  Home health PT;Supervision for mobility/OOB     Equipment Recommendations  Rolling walker with 5" wheels;3in1 (PT)    Recommendations for Other Services       Precautions / Restrictions Precautions Precautions: Knee Precaution Booklet Issued: Yes (comment) Precaution Comments: Pt given packet with PT exercises Restrictions Weight Bearing Restrictions: Yes RLE Weight Bearing: Weight bearing as tolerated    Mobility  Bed Mobility Overal bed mobility: Modified Independent                Transfers Overall transfer level: Needs assistance Equipment used: Rolling walker (2 wheeled) Transfers: Sit to/from Stand Sit to Stand: Min assist            Ambulation/Gait Ambulation/Gait assistance: Min guard;Min  assist Gait Distance (Feet): 30 Feet Assistive device: Rolling walker (2 wheeled) Gait Pattern/deviations: Step-through pattern;Wide base of support Gait velocity: decreased   General Gait Details: self limited due to increased soreness today.   Stairs             Wheelchair Mobility    Modified Rankin (Stroke Patients Only)       Balance Overall balance assessment: Needs assistance Sitting-balance support: Feet supported Sitting balance-Leahy Scale: Good     Standing balance support: Bilateral upper extremity supported Standing balance-Leahy Scale: Fair                              Cognition Arousal/Alertness: Awake/alert Behavior During Therapy: WFL for tasks assessed/performed Overall Cognitive Status: Within Functional Limits for tasks assessed                                        Exercises Total Joint Exercises Ankle Circles/Pumps: AROM;10 reps;Right;Seated Quad Sets: AROM;Right;10 reps;Seated Short Arc Quad: AROM;Right;10 reps;Seated Heel Slides: AROM;Right;10 reps;Seated Hip ABduction/ADduction: AROM;10 reps;Right;Seated Straight Leg Raises: AROM;Right;10 reps;Seated Goniometric ROM: 3-85 limited by soreness    General Comments        Pertinent Vitals/Pain Pain Assessment: Faces Faces Pain Scale: Hurts whole lot Pain Location: R knee Pain Descriptors / Indicators: Aching;Sore Pain Intervention(s): Limited activity within patient's tolerance;Monitored during session;Premedicated  before session;Repositioned;Ice applied    Home Living                      Prior Function            PT Goals (current goals can now be found in the care plan section) Progress towards PT goals: Progressing toward goals    Frequency    BID      PT Plan Current plan remains appropriate    Co-evaluation              AM-PAC PT "6 Clicks" Mobility   Outcome Measure  Help needed turning from your back to your  side while in a flat bed without using bedrails?: None Help needed moving from lying on your back to sitting on the side of a flat bed without using bedrails?: None Help needed moving to and from a bed to a chair (including a wheelchair)?: A Little Help needed standing up from a chair using your arms (e.g., wheelchair or bedside chair)?: A Little Help needed to walk in hospital room?: A Little Help needed climbing 3-5 steps with a railing? : A Little 6 Click Score: 20    End of Session Equipment Utilized During Treatment: Gait belt Activity Tolerance: Patient limited by fatigue;Patient limited by pain Patient left: in chair;with call bell/phone within reach;with chair alarm set Nurse Communication: Mobility status Pain - Right/Left: Right Pain - part of body: Knee     Time: 4239-5320 PT Time Calculation (min) (ACUTE ONLY): 23 min  Charges:  $Gait Training: 8-22 mins $Therapeutic Exercise: 8-22 mins                    Chesley Noon, PTA 07/04/20, 10:19 AM

## 2020-07-04 NOTE — Progress Notes (Signed)
PT Cancellation Note  Patient Details Name: Kimberly Henry MRN: [id number] DOB: [birth year]   Cancelled Treatment:    Reason Eval/Treat Not Completed: Other (comment)   Pt offered and encouraged PM session.  Pt eating lunch and stated she was going home after.  She stated LE was still sore but she stated she "would manage" at home and did not want further therapy interventions.  Stated her son would be with her to assist.  Voiced no further mobility concerns and declined therapy again.  Chesley Noon 07/04/2020, 1:11 PM

## 2020-07-05 DIAGNOSIS — M1712 Unilateral primary osteoarthritis, left knee: Secondary | ICD-10-CM | POA: Diagnosis not present

## 2020-07-05 DIAGNOSIS — Z7901 Long term (current) use of anticoagulants: Secondary | ICD-10-CM | POA: Diagnosis not present

## 2020-07-05 DIAGNOSIS — Z471 Aftercare following joint replacement surgery: Secondary | ICD-10-CM | POA: Diagnosis not present

## 2020-07-05 DIAGNOSIS — I4891 Unspecified atrial fibrillation: Secondary | ICD-10-CM | POA: Diagnosis not present

## 2020-07-05 DIAGNOSIS — Z9181 History of falling: Secondary | ICD-10-CM | POA: Diagnosis not present

## 2020-07-05 DIAGNOSIS — Z87891 Personal history of nicotine dependence: Secondary | ICD-10-CM | POA: Diagnosis not present

## 2020-07-05 DIAGNOSIS — K766 Portal hypertension: Secondary | ICD-10-CM | POA: Diagnosis not present

## 2020-07-05 DIAGNOSIS — I272 Pulmonary hypertension, unspecified: Secondary | ICD-10-CM | POA: Diagnosis not present

## 2020-07-05 DIAGNOSIS — D649 Anemia, unspecified: Secondary | ICD-10-CM | POA: Diagnosis not present

## 2020-07-05 DIAGNOSIS — I083 Combined rheumatic disorders of mitral, aortic and tricuspid valves: Secondary | ICD-10-CM | POA: Diagnosis not present

## 2020-07-05 DIAGNOSIS — I110 Hypertensive heart disease with heart failure: Secondary | ICD-10-CM | POA: Diagnosis not present

## 2020-07-05 DIAGNOSIS — J449 Chronic obstructive pulmonary disease, unspecified: Secondary | ICD-10-CM | POA: Diagnosis not present

## 2020-07-05 DIAGNOSIS — Z96651 Presence of right artificial knee joint: Secondary | ICD-10-CM | POA: Diagnosis not present

## 2020-07-05 DIAGNOSIS — E785 Hyperlipidemia, unspecified: Secondary | ICD-10-CM | POA: Diagnosis not present

## 2020-07-05 DIAGNOSIS — I2510 Atherosclerotic heart disease of native coronary artery without angina pectoris: Secondary | ICD-10-CM | POA: Diagnosis not present

## 2020-07-05 DIAGNOSIS — I5022 Chronic systolic (congestive) heart failure: Secondary | ICD-10-CM | POA: Diagnosis not present

## 2020-07-05 DIAGNOSIS — I440 Atrioventricular block, first degree: Secondary | ICD-10-CM | POA: Diagnosis not present

## 2020-07-07 ENCOUNTER — Ambulatory Visit (INDEPENDENT_AMBULATORY_CARE_PROVIDER_SITE_OTHER): Payer: Medicare Other

## 2020-07-07 DIAGNOSIS — K766 Portal hypertension: Secondary | ICD-10-CM | POA: Diagnosis not present

## 2020-07-07 DIAGNOSIS — I10 Essential (primary) hypertension: Secondary | ICD-10-CM | POA: Diagnosis not present

## 2020-07-07 DIAGNOSIS — Z9181 History of falling: Secondary | ICD-10-CM | POA: Diagnosis not present

## 2020-07-07 DIAGNOSIS — I083 Combined rheumatic disorders of mitral, aortic and tricuspid valves: Secondary | ICD-10-CM | POA: Diagnosis not present

## 2020-07-07 DIAGNOSIS — Z96651 Presence of right artificial knee joint: Secondary | ICD-10-CM | POA: Diagnosis not present

## 2020-07-07 DIAGNOSIS — I2510 Atherosclerotic heart disease of native coronary artery without angina pectoris: Secondary | ICD-10-CM | POA: Diagnosis not present

## 2020-07-07 DIAGNOSIS — I4891 Unspecified atrial fibrillation: Secondary | ICD-10-CM | POA: Diagnosis not present

## 2020-07-07 DIAGNOSIS — E785 Hyperlipidemia, unspecified: Secondary | ICD-10-CM | POA: Diagnosis not present

## 2020-07-07 DIAGNOSIS — I110 Hypertensive heart disease with heart failure: Secondary | ICD-10-CM | POA: Diagnosis not present

## 2020-07-07 DIAGNOSIS — Z471 Aftercare following joint replacement surgery: Secondary | ICD-10-CM | POA: Diagnosis not present

## 2020-07-07 DIAGNOSIS — D649 Anemia, unspecified: Secondary | ICD-10-CM | POA: Diagnosis not present

## 2020-07-07 DIAGNOSIS — I5022 Chronic systolic (congestive) heart failure: Secondary | ICD-10-CM | POA: Diagnosis not present

## 2020-07-07 DIAGNOSIS — I272 Pulmonary hypertension, unspecified: Secondary | ICD-10-CM | POA: Diagnosis not present

## 2020-07-07 DIAGNOSIS — Z87891 Personal history of nicotine dependence: Secondary | ICD-10-CM | POA: Diagnosis not present

## 2020-07-07 DIAGNOSIS — M1712 Unilateral primary osteoarthritis, left knee: Secondary | ICD-10-CM | POA: Diagnosis not present

## 2020-07-07 DIAGNOSIS — Z7901 Long term (current) use of anticoagulants: Secondary | ICD-10-CM | POA: Diagnosis not present

## 2020-07-07 DIAGNOSIS — I440 Atrioventricular block, first degree: Secondary | ICD-10-CM | POA: Diagnosis not present

## 2020-07-07 DIAGNOSIS — J449 Chronic obstructive pulmonary disease, unspecified: Secondary | ICD-10-CM | POA: Diagnosis not present

## 2020-07-07 NOTE — Chronic Care Management (AMB) (Signed)
Chronic Care Management   Follow Up Note   07/07/2020 Name: Kimberly Henry MRN: [id number] DOB: [birth year]  Primary Care Provider: Steele Sizer, MD Reason for referral : Chronic Care Management   Kimberly Henry is a 79 y.o. year old female who is a primary care patient of Steele Sizer, MD. She is currently engaged with the chronic care management team. A routine telephonic outreach was conducted today.  Review of Kimberly Henry's status, including review of consultants reports, relevant labs and test results was conducted today. Collaboration with appropriate care team members was performed as part of the comprehensive evaluation and provision of chronic care management services.    SDOH (Social Determinants of Health) assessments performed: No    Outpatient Encounter Medications as of 07/07/2020  Medication Sig Note  . acetaminophen (TYLENOL) 500 MG tablet Take 1,000 mg by mouth every 6 (six) hours as needed for pain.    . carvedilol (COREG) 6.25 MG tablet TAKE 1 TABLET BY MOUTH  TWICE DAILY WITH MEALS (Patient taking differently: Take 6.25 mg by mouth 2 (two) times daily with a meal. ) 07/02/2020: Pt took it this am at 05:45 am  . digoxin (LANOXIN) 0.125 MG tablet TAKE 1 TABLET BY MOUTH  DAILY (Patient taking differently: Take 0.125 mg by mouth daily. )   . docusate sodium (COLACE) 100 MG capsule Take 1 capsule (100 mg total) by mouth 2 (two) times daily.   Marland Kitchen ELIQUIS 5 MG TABS tablet TAKE 1 TABLET BY MOUTH  TWICE DAILY (Patient taking differently: Take 5 mg by mouth 2 (two) times daily. )   . furosemide (LASIX) 40 MG tablet TAKE 1 TABLET BY MOUTH  DAILY (Patient taking differently: 40 mg daily. )   . HYDROcodone-acetaminophen (NORCO/VICODIN) 5-325 MG tablet Take 1 tablet by mouth every 4 (four) hours as needed for moderate pain (pain score 4-6).   Marland Kitchen levocetirizine (XYZAL) 5 MG tablet Take 1 tablet (5 mg total) by mouth every evening.   . methocarbamol (ROBAXIN) 500 MG tablet Take 1  tablet (500 mg total) by mouth every 6 (six) hours as needed for muscle spasms.   . ondansetron (ZOFRAN) 4 MG tablet Take 1 tablet (4 mg total) by mouth every 6 (six) hours as needed for nausea.   . potassium chloride SA (KLOR-CON) 20 MEQ tablet Take 1 tablet (20 mEq total) by mouth daily. Must make appointment for further refills (Patient taking differently: Take 20 mEq by mouth daily. )   . rosuvastatin (CRESTOR) 40 MG tablet Take 1 tablet (40 mg total) by mouth daily. In place of Atorvastatin (Patient taking differently: Take 40 mg by mouth daily. )   . sacubitril-valsartan (ENTRESTO) 97-103 MG Take 1 tablet by mouth 2 (two) times daily.   Marland Kitchen spironolactone (ALDACTONE) 25 MG tablet TAKE 1 TABLET BY MOUTH  DAILY (Patient taking differently: Take by mouth daily. )    No facility-administered encounter medications on file as of 07/07/2020.       Goals Addressed            This Visit's Progress   . Chronic Disease Management       CARE PLAN ENTRY (see longitudinal plan of care for additional care plan information)  Current Barriers:  . Chronic Disease Management support and education needs related to Hypertension, Dyslipidemia and CHF.  Case Manager Clinical Goal(s):  Over the next 120 days, patient will: . Not require hospitalization or emergent care d/t complications r/t CHF. Marland Kitchen Continue taking all medications  as prescribed. . Attend all medical appointments as scheduled. . Weigh daily and record readings. . Monitor BP and record readings. . Continue adherence with recommended cardiac prudent/heart healthy diet. . Follow recommended safety measures to prevent falls and injuries. Over the next 45 days, patient will: . Update care management team if medication assistance is needed.  Interventions:  . Inter-disciplinary care team collaboration (see longitudinal plan of care) . S/P total right knee arthroplasty. Reviewed medications, discharge instructions and treatment plan.  Reports having all needed prescriptions and taking as prescribed. Confirmed start of home health services. Reports completing the initial nursing visit on 07/05/20. Pending visit with Physical Therapy later today. Will follow-up with [facility] to confirm date of follow-up visit.  Marland Kitchen Provided education regarding s/sx of infection along with post surgical complications that require immediate medical attention. Reports surgical dressing is still in place. Anticipates home health staff changing the dressing later today. Reports pain is well controlled. Denies fevers or concerning symptoms since discharge.   . Reviewed safety measures and importance of following the recommended restrictions to prevent falls and injuries. Reports using her walker when ambulating and following the surgical teams recommendations regarding weight bearing. Her grandson is currently available to assist her in the home as needed. Denies falls.   . Reviewed BP readings and s/sx of complications related to CHF. Reports BP readings were within range when monitored by the home health staff. She is currently unable to weigh but denies concerning symptoms r/t CHF.    Marland Kitchen Reviewed pending medical appointments. Pending PCP visit on 07/11/20. Requested to reschedule this appointment d/t multiple home health visits. She prefers to complete the visit after the home health sessions are complete. Staff rescheduled PCP visit for 09/24/20.  Will contact [facility] to confirm date of her post-op follow-up. Denies concerns regarding transportation.    . Discussed plans for care management follow up. Denies urgent concerns or immediate care management needs. Reports family members are available to assist in the home as she recovers. Agreed to contact the care management team with concerns if needed prior to the next scheduled outreach.   Patient Self Care Activities:  . Self administers medications  . Attends scheduled provider  appointments . Calls pharmacy for medication refills . Attends church or other social activities . Performs ADL's independently . Calls provider office for new concerns or questions . Requires assistance with IADL's d/t recent knee surgery.   Please see past updates related to this goal by clicking on the "Past Updates" button in the selected goal         PLAN A member of the care management team will follow-up with Kimberly Henry next month.    [name] [facility] Care Management [facility] [phone]

## 2020-07-09 DIAGNOSIS — I110 Hypertensive heart disease with heart failure: Secondary | ICD-10-CM | POA: Diagnosis not present

## 2020-07-09 DIAGNOSIS — Z87891 Personal history of nicotine dependence: Secondary | ICD-10-CM | POA: Diagnosis not present

## 2020-07-09 DIAGNOSIS — I272 Pulmonary hypertension, unspecified: Secondary | ICD-10-CM | POA: Diagnosis not present

## 2020-07-09 DIAGNOSIS — I083 Combined rheumatic disorders of mitral, aortic and tricuspid valves: Secondary | ICD-10-CM | POA: Diagnosis not present

## 2020-07-09 DIAGNOSIS — D649 Anemia, unspecified: Secondary | ICD-10-CM | POA: Diagnosis not present

## 2020-07-09 DIAGNOSIS — J449 Chronic obstructive pulmonary disease, unspecified: Secondary | ICD-10-CM | POA: Diagnosis not present

## 2020-07-09 DIAGNOSIS — K766 Portal hypertension: Secondary | ICD-10-CM | POA: Diagnosis not present

## 2020-07-09 DIAGNOSIS — M1712 Unilateral primary osteoarthritis, left knee: Secondary | ICD-10-CM | POA: Diagnosis not present

## 2020-07-09 DIAGNOSIS — Z471 Aftercare following joint replacement surgery: Secondary | ICD-10-CM | POA: Diagnosis not present

## 2020-07-09 DIAGNOSIS — I2510 Atherosclerotic heart disease of native coronary artery without angina pectoris: Secondary | ICD-10-CM | POA: Diagnosis not present

## 2020-07-09 DIAGNOSIS — I440 Atrioventricular block, first degree: Secondary | ICD-10-CM | POA: Diagnosis not present

## 2020-07-09 DIAGNOSIS — Z7901 Long term (current) use of anticoagulants: Secondary | ICD-10-CM | POA: Diagnosis not present

## 2020-07-09 DIAGNOSIS — Z9181 History of falling: Secondary | ICD-10-CM | POA: Diagnosis not present

## 2020-07-09 DIAGNOSIS — E785 Hyperlipidemia, unspecified: Secondary | ICD-10-CM | POA: Diagnosis not present

## 2020-07-09 DIAGNOSIS — I4891 Unspecified atrial fibrillation: Secondary | ICD-10-CM | POA: Diagnosis not present

## 2020-07-09 DIAGNOSIS — I5022 Chronic systolic (congestive) heart failure: Secondary | ICD-10-CM | POA: Diagnosis not present

## 2020-07-09 DIAGNOSIS — Z96651 Presence of right artificial knee joint: Secondary | ICD-10-CM | POA: Diagnosis not present

## 2020-07-11 ENCOUNTER — Ambulatory Visit: Payer: Medicare Other | Admitting: Family Medicine

## 2020-07-11 DIAGNOSIS — Z471 Aftercare following joint replacement surgery: Secondary | ICD-10-CM | POA: Diagnosis not present

## 2020-07-11 DIAGNOSIS — D649 Anemia, unspecified: Secondary | ICD-10-CM | POA: Diagnosis not present

## 2020-07-11 DIAGNOSIS — E785 Hyperlipidemia, unspecified: Secondary | ICD-10-CM | POA: Diagnosis not present

## 2020-07-11 DIAGNOSIS — I083 Combined rheumatic disorders of mitral, aortic and tricuspid valves: Secondary | ICD-10-CM | POA: Diagnosis not present

## 2020-07-11 DIAGNOSIS — M1712 Unilateral primary osteoarthritis, left knee: Secondary | ICD-10-CM | POA: Diagnosis not present

## 2020-07-11 DIAGNOSIS — Z87891 Personal history of nicotine dependence: Secondary | ICD-10-CM | POA: Diagnosis not present

## 2020-07-11 DIAGNOSIS — Z9181 History of falling: Secondary | ICD-10-CM | POA: Diagnosis not present

## 2020-07-11 DIAGNOSIS — I440 Atrioventricular block, first degree: Secondary | ICD-10-CM | POA: Diagnosis not present

## 2020-07-11 DIAGNOSIS — J449 Chronic obstructive pulmonary disease, unspecified: Secondary | ICD-10-CM | POA: Diagnosis not present

## 2020-07-11 DIAGNOSIS — K766 Portal hypertension: Secondary | ICD-10-CM | POA: Diagnosis not present

## 2020-07-11 DIAGNOSIS — I4891 Unspecified atrial fibrillation: Secondary | ICD-10-CM | POA: Diagnosis not present

## 2020-07-11 DIAGNOSIS — Z7901 Long term (current) use of anticoagulants: Secondary | ICD-10-CM | POA: Diagnosis not present

## 2020-07-11 DIAGNOSIS — I272 Pulmonary hypertension, unspecified: Secondary | ICD-10-CM | POA: Diagnosis not present

## 2020-07-11 DIAGNOSIS — Z96651 Presence of right artificial knee joint: Secondary | ICD-10-CM | POA: Diagnosis not present

## 2020-07-11 DIAGNOSIS — I2510 Atherosclerotic heart disease of native coronary artery without angina pectoris: Secondary | ICD-10-CM | POA: Diagnosis not present

## 2020-07-11 DIAGNOSIS — I5022 Chronic systolic (congestive) heart failure: Secondary | ICD-10-CM | POA: Diagnosis not present

## 2020-07-11 DIAGNOSIS — I110 Hypertensive heart disease with heart failure: Secondary | ICD-10-CM | POA: Diagnosis not present

## 2020-07-15 NOTE — Patient Instructions (Addendum)
Thank you for allowing the Chronic Care Management team to participate in your care.  Goals Addressed            This Visit's Progress   . Chronic Disease Management       CARE PLAN ENTRY (see longitudinal plan of care for additional care plan information)  Current Barriers:  . Chronic Disease Management support and education needs related to Hypertension, Dyslipidemia and CHF.  Case Manager Clinical Goal(s):  Over the next 120 days, patient will: . Not require hospitalization or emergent care d/t complications r/t CHF. Marland Kitchen Continue taking all medications as prescribed. . Attend all medical appointments as scheduled. . Weigh daily and record readings. . Monitor BP and record readings. . Continue adherence with recommended cardiac prudent/heart healthy diet. . Follow recommended safety measures to prevent falls and injuries. Over the next 45 days, patient will: . Update care management team if medication assistance is needed.  Interventions:  . Inter-disciplinary care team collaboration (see longitudinal plan of care) . S/P total right knee arthroplasty. Reviewed medications, discharge instructions and treatment plan. Reports having all needed prescriptions and taking as prescribed. Confirmed start of home health services. Reports completing the initial nursing visit on 07/05/20. Pending visit with Physical Therapy later today. Will follow-up with [facility] to confirm date of follow-up visit.  Marland Kitchen Provided education regarding s/sx of infection along with post surgical complications that require immediate medical attention. Reports surgical dressing is still in place. Anticipates home health staff changing the dressing later today. Reports pain is well controlled. Denies fevers or concerning symptoms since discharge.   . Reviewed safety measures and importance of following the recommended restrictions to prevent falls and injuries. Reports using her walker when ambulating and following  the surgical teams recommendations regarding weight bearing. Her grandson is currently available to assist her in the home as needed. Denies falls.   . Reviewed BP readings and s/sx of complications related to CHF. Reports BP readings were within range when monitored by the home health staff. She is currently unable to weigh but denies concerning symptoms r/t CHF.    Marland Kitchen Reviewed pending medical appointments. Pending PCP visit on 07/11/20. Requested to reschedule this appointment d/t multiple home health visits. She prefers to complete the visit after the home health sessions are complete. Staff rescheduled PCP visit for 09/24/20.  Will contact [facility] to confirm date of her post-op follow-up. Denies concerns regarding transportation.    . Discussed plans for care management follow up. Denies urgent concerns or immediate care management needs. Reports family members are available to assist in the home as she recovers. Agreed to contact the care management team with concerns if needed prior to the next scheduled outreach.   Patient Self Care Activities:  . Self administers medications  . Attends scheduled provider appointments . Calls pharmacy for medication refills . Attends church or other social activities . Performs ADL's independently . Calls provider office for new concerns or questions . Requires assistance with IADL's d/t recent knee surgery.   Please see past updates related to this goal by clicking on the "Past Updates" button in the selected goal        Kimberly Henry verbalized understanding of the information discussed during the telephonic outreach today. Declined need for a mailed/printed copy of the information.   A member of the care management team will follow-up with Kimberly Henry next month.    Kimberly Henry [facility] Care Management [facility] [phone]

## 2020-07-16 DIAGNOSIS — M1712 Unilateral primary osteoarthritis, left knee: Secondary | ICD-10-CM | POA: Diagnosis not present

## 2020-07-16 DIAGNOSIS — J449 Chronic obstructive pulmonary disease, unspecified: Secondary | ICD-10-CM | POA: Diagnosis not present

## 2020-07-16 DIAGNOSIS — I272 Pulmonary hypertension, unspecified: Secondary | ICD-10-CM | POA: Diagnosis not present

## 2020-07-16 DIAGNOSIS — I5022 Chronic systolic (congestive) heart failure: Secondary | ICD-10-CM | POA: Diagnosis not present

## 2020-07-16 DIAGNOSIS — Z87891 Personal history of nicotine dependence: Secondary | ICD-10-CM | POA: Diagnosis not present

## 2020-07-16 DIAGNOSIS — Z96651 Presence of right artificial knee joint: Secondary | ICD-10-CM | POA: Diagnosis not present

## 2020-07-16 DIAGNOSIS — K766 Portal hypertension: Secondary | ICD-10-CM | POA: Diagnosis not present

## 2020-07-16 DIAGNOSIS — I4891 Unspecified atrial fibrillation: Secondary | ICD-10-CM | POA: Diagnosis not present

## 2020-07-16 DIAGNOSIS — E785 Hyperlipidemia, unspecified: Secondary | ICD-10-CM | POA: Diagnosis not present

## 2020-07-16 DIAGNOSIS — Z9181 History of falling: Secondary | ICD-10-CM | POA: Diagnosis not present

## 2020-07-16 DIAGNOSIS — D649 Anemia, unspecified: Secondary | ICD-10-CM | POA: Diagnosis not present

## 2020-07-16 DIAGNOSIS — Z471 Aftercare following joint replacement surgery: Secondary | ICD-10-CM | POA: Diagnosis not present

## 2020-07-16 DIAGNOSIS — I2510 Atherosclerotic heart disease of native coronary artery without angina pectoris: Secondary | ICD-10-CM | POA: Diagnosis not present

## 2020-07-16 DIAGNOSIS — Z7901 Long term (current) use of anticoagulants: Secondary | ICD-10-CM | POA: Diagnosis not present

## 2020-07-16 DIAGNOSIS — I440 Atrioventricular block, first degree: Secondary | ICD-10-CM | POA: Diagnosis not present

## 2020-07-16 DIAGNOSIS — I110 Hypertensive heart disease with heart failure: Secondary | ICD-10-CM | POA: Diagnosis not present

## 2020-07-16 DIAGNOSIS — I083 Combined rheumatic disorders of mitral, aortic and tricuspid valves: Secondary | ICD-10-CM | POA: Diagnosis not present

## 2020-07-18 DIAGNOSIS — Z9181 History of falling: Secondary | ICD-10-CM | POA: Diagnosis not present

## 2020-07-18 DIAGNOSIS — Z96651 Presence of right artificial knee joint: Secondary | ICD-10-CM | POA: Diagnosis not present

## 2020-07-18 DIAGNOSIS — K766 Portal hypertension: Secondary | ICD-10-CM | POA: Diagnosis not present

## 2020-07-18 DIAGNOSIS — Z7901 Long term (current) use of anticoagulants: Secondary | ICD-10-CM | POA: Diagnosis not present

## 2020-07-18 DIAGNOSIS — I272 Pulmonary hypertension, unspecified: Secondary | ICD-10-CM | POA: Diagnosis not present

## 2020-07-18 DIAGNOSIS — I5022 Chronic systolic (congestive) heart failure: Secondary | ICD-10-CM | POA: Diagnosis not present

## 2020-07-18 DIAGNOSIS — I4891 Unspecified atrial fibrillation: Secondary | ICD-10-CM | POA: Diagnosis not present

## 2020-07-18 DIAGNOSIS — I110 Hypertensive heart disease with heart failure: Secondary | ICD-10-CM | POA: Diagnosis not present

## 2020-07-18 DIAGNOSIS — I083 Combined rheumatic disorders of mitral, aortic and tricuspid valves: Secondary | ICD-10-CM | POA: Diagnosis not present

## 2020-07-18 DIAGNOSIS — I440 Atrioventricular block, first degree: Secondary | ICD-10-CM | POA: Diagnosis not present

## 2020-07-18 DIAGNOSIS — J449 Chronic obstructive pulmonary disease, unspecified: Secondary | ICD-10-CM | POA: Diagnosis not present

## 2020-07-18 DIAGNOSIS — E785 Hyperlipidemia, unspecified: Secondary | ICD-10-CM | POA: Diagnosis not present

## 2020-07-18 DIAGNOSIS — M1712 Unilateral primary osteoarthritis, left knee: Secondary | ICD-10-CM | POA: Diagnosis not present

## 2020-07-18 DIAGNOSIS — Z87891 Personal history of nicotine dependence: Secondary | ICD-10-CM | POA: Diagnosis not present

## 2020-07-18 DIAGNOSIS — D649 Anemia, unspecified: Secondary | ICD-10-CM | POA: Diagnosis not present

## 2020-07-18 DIAGNOSIS — Z471 Aftercare following joint replacement surgery: Secondary | ICD-10-CM | POA: Diagnosis not present

## 2020-07-18 DIAGNOSIS — I2510 Atherosclerotic heart disease of native coronary artery without angina pectoris: Secondary | ICD-10-CM | POA: Diagnosis not present

## 2020-07-22 DIAGNOSIS — M25661 Stiffness of right knee, not elsewhere classified: Secondary | ICD-10-CM | POA: Diagnosis not present

## 2020-07-22 DIAGNOSIS — M25561 Pain in right knee: Secondary | ICD-10-CM | POA: Diagnosis not present

## 2020-07-22 DIAGNOSIS — Z96651 Presence of right artificial knee joint: Secondary | ICD-10-CM | POA: Diagnosis not present

## 2020-07-24 DIAGNOSIS — Z96651 Presence of right artificial knee joint: Secondary | ICD-10-CM | POA: Diagnosis not present

## 2020-07-24 DIAGNOSIS — M25561 Pain in right knee: Secondary | ICD-10-CM | POA: Diagnosis not present

## 2020-07-24 DIAGNOSIS — M25661 Stiffness of right knee, not elsewhere classified: Secondary | ICD-10-CM | POA: Diagnosis not present

## 2020-07-28 DIAGNOSIS — Z96651 Presence of right artificial knee joint: Secondary | ICD-10-CM | POA: Diagnosis not present

## 2020-07-28 DIAGNOSIS — M25661 Stiffness of right knee, not elsewhere classified: Secondary | ICD-10-CM | POA: Diagnosis not present

## 2020-07-28 DIAGNOSIS — M25561 Pain in right knee: Secondary | ICD-10-CM | POA: Diagnosis not present

## 2020-08-01 DIAGNOSIS — Z96651 Presence of right artificial knee joint: Secondary | ICD-10-CM | POA: Diagnosis not present

## 2020-08-01 DIAGNOSIS — M25561 Pain in right knee: Secondary | ICD-10-CM | POA: Diagnosis not present

## 2020-08-01 DIAGNOSIS — M25661 Stiffness of right knee, not elsewhere classified: Secondary | ICD-10-CM | POA: Diagnosis not present

## 2020-08-05 DIAGNOSIS — Z96651 Presence of right artificial knee joint: Secondary | ICD-10-CM | POA: Diagnosis not present

## 2020-08-05 DIAGNOSIS — M25561 Pain in right knee: Secondary | ICD-10-CM | POA: Diagnosis not present

## 2020-08-05 DIAGNOSIS — M25661 Stiffness of right knee, not elsewhere classified: Secondary | ICD-10-CM | POA: Diagnosis not present

## 2020-08-12 DIAGNOSIS — M25561 Pain in right knee: Secondary | ICD-10-CM | POA: Diagnosis not present

## 2020-08-12 DIAGNOSIS — M25661 Stiffness of right knee, not elsewhere classified: Secondary | ICD-10-CM | POA: Diagnosis not present

## 2020-08-12 DIAGNOSIS — Z96651 Presence of right artificial knee joint: Secondary | ICD-10-CM | POA: Diagnosis not present

## 2020-08-14 DIAGNOSIS — M25661 Stiffness of right knee, not elsewhere classified: Secondary | ICD-10-CM | POA: Diagnosis not present

## 2020-08-14 DIAGNOSIS — M25561 Pain in right knee: Secondary | ICD-10-CM | POA: Diagnosis not present

## 2020-08-14 DIAGNOSIS — Z96651 Presence of right artificial knee joint: Secondary | ICD-10-CM | POA: Diagnosis not present

## 2020-08-19 DIAGNOSIS — Z96651 Presence of right artificial knee joint: Secondary | ICD-10-CM | POA: Diagnosis not present

## 2020-08-19 DIAGNOSIS — M25561 Pain in right knee: Secondary | ICD-10-CM | POA: Diagnosis not present

## 2020-08-19 DIAGNOSIS — M25661 Stiffness of right knee, not elsewhere classified: Secondary | ICD-10-CM | POA: Diagnosis not present

## 2020-08-21 DIAGNOSIS — Z96651 Presence of right artificial knee joint: Secondary | ICD-10-CM | POA: Diagnosis not present

## 2020-08-21 DIAGNOSIS — M25661 Stiffness of right knee, not elsewhere classified: Secondary | ICD-10-CM | POA: Diagnosis not present

## 2020-08-21 DIAGNOSIS — M25561 Pain in right knee: Secondary | ICD-10-CM | POA: Diagnosis not present

## 2020-08-22 ENCOUNTER — Ambulatory Visit: Payer: Self-pay

## 2020-08-22 DIAGNOSIS — I5022 Chronic systolic (congestive) heart failure: Secondary | ICD-10-CM

## 2020-08-22 DIAGNOSIS — I10 Essential (primary) hypertension: Secondary | ICD-10-CM

## 2020-08-25 NOTE — Patient Instructions (Signed)
  Thank you for allowing the Chronic Care Management team to participate in your care.  Goals Addressed            This Visit's Progress   . COMPLETED: Chronic Disease Management       CARE PLAN ENTRY (see longitudinal plan of care for additional care plan information)  Current Barriers:  . Chronic Disease Management support and education needs related to Hypertension, Dyslipidemia and CHF.  Case Manager Clinical Goal(s):  Over the next 120 days, patient will: . Not require hospitalization or emergent care d/t complications r/t CHF. Marland Kitchen Continue taking all medications as prescribed. . Attend all medical appointments as scheduled. . Weigh daily and record readings. . Monitor BP and record readings. . Continue adherence with recommended cardiac prudent/heart healthy diet. . Follow recommended safety measures to prevent falls and injuries. Over the next 45 days, patient will: . Update care management team if medication assistance is needed.  Interventions:  . Inter-disciplinary care team collaboration (see longitudinal plan of care) . Discussed plan r/t previous right knee surgery. Kimberly Henry reports doing well. She no longer requires home health services. Currently completes physical therapy sessions with [facility]. Denies edema or pain to her right knee. She continues to experience occasional stiffness but reports ambulating well with her cane. Reports wearing compression hose as advised by the Ortho team. Denies recent falls.   . Discussed care management needs. Kimberly Henry has met her care management goals. Reports compliance with medications and treatment recommendations. Reports good family support and has been able to perform tasks independently for the past three weeks. Denies urgent concerns or additional care management needs. She is scheduled for PCP outreach next month. Agreed to update her provider if her needs change and additional assistance is required.   Patient Self  Care Activities:  . Self administers medications  . Attends scheduled provider appointments . Calls pharmacy for medication refills . Attends church or other social activities . Performs ADL's and IADL's independently . Calls provider office for new concerns or questions   Please see past updates related to this goal by clicking on the "Past Updates" button in the selected goal        Kimberly Henry verbalized understanding of the information discussed during the telephonic outreach today. Declined need for a mailed/printed copy of the information.    Kimberly Henry has met her care management goals. She is scheduled for PCP outreach on 09/24/20. Agreed to update her provider if her needs change and additional care management assistance is required. The team will gladly assist.     Kimberly Henry [facility] Care Management [facility] [phone]

## 2020-08-25 NOTE — Chronic Care Management (AMB) (Signed)
Chronic Care Management   Follow Up Note    Name: Kimberly Kimberly Henry MRN: [id number] DOB: 05/25/1941  Primary Care Provider: Steele Sizer, MD Reason for referral : Chronic Care Management   Kimberly Kimberly Henry is a 79 y.o. year old female who is a primary care patient of Steele Sizer, MD. A routine telephonic outreach was conducted today. She has met her care management goals.   Review of Kimberly Kimberly Henry's status, including review of consultants reports, relevant labs and test results was conducted today. Collaboration with appropriate care team members was performed as part of the comprehensive evaluation and provision of chronic care management services.    SDOH (Social Determinants of Health) assessments performed: No   Outpatient Encounter Medications as of 08/22/2020  Medication Sig Note  . acetaminophen (TYLENOL) 500 MG tablet Take 1,000 mg by mouth every 6 (Kimberly Henry) hours as needed for pain.    . carvedilol (COREG) 6.25 MG tablet TAKE 1 TABLET BY MOUTH  TWICE DAILY WITH MEALS (Patient taking differently: Take 6.25 mg by mouth 2 (two) times daily with a meal. ) 07/02/2020: Pt took it this am at 05:45 am  . digoxin (LANOXIN) 0.125 MG tablet TAKE 1 TABLET BY MOUTH  DAILY (Patient taking differently: Take 0.125 mg by mouth daily. )   . docusate sodium (COLACE) 100 MG capsule Take 1 capsule (100 mg total) by mouth 2 (two) times daily.   Marland Kitchen ELIQUIS 5 MG TABS tablet TAKE 1 TABLET BY MOUTH  TWICE DAILY (Patient taking differently: Take 5 mg by mouth 2 (two) times daily. )   . furosemide (LASIX) 40 MG tablet TAKE 1 TABLET BY MOUTH  DAILY (Patient taking differently: 40 mg daily. )   . HYDROcodone-acetaminophen (NORCO/VICODIN) 5-325 MG tablet Take 1 tablet by mouth every 4 (four) hours as needed for moderate pain (pain score 4-6).   Marland Kitchen levocetirizine (XYZAL) 5 MG tablet Take 1 tablet (5 mg total) by mouth every evening.   . methocarbamol (ROBAXIN) 500 MG tablet Take 1 tablet (500 mg total) by mouth  every 6 (Kimberly Henry) hours as needed for muscle spasms.   . ondansetron (ZOFRAN) 4 MG tablet Take 1 tablet (4 mg total) by mouth every 6 (Kimberly Henry) hours as needed for nausea.   . potassium chloride SA (KLOR-CON) 20 MEQ tablet Take 1 tablet (20 mEq total) by mouth daily. Must make appointment for further refills (Patient taking differently: Take 20 mEq by mouth daily. )   . rosuvastatin (CRESTOR) 40 MG tablet Take 1 tablet (40 mg total) by mouth daily. In place of Atorvastatin (Patient taking differently: Take 40 mg by mouth daily. )   . sacubitril-valsartan (ENTRESTO) 97-103 MG Take 1 tablet by mouth 2 (two) times daily.   Marland Kitchen spironolactone (ALDACTONE) 25 MG tablet TAKE 1 TABLET BY MOUTH  DAILY (Patient taking differently: Take by mouth daily. )    No facility-administered encounter medications on file as of 08/22/2020.       Goals Addressed            This Visit's Progress   . COMPLETED: Chronic Disease Management       CARE PLAN ENTRY (see longitudinal plan of care for additional care plan information)  Current Barriers:  . Chronic Disease Management support and education needs related to Hypertension, Dyslipidemia and CHF.  Case Manager Clinical Goal(s):  Over the next 120 days, patient will: . Not require hospitalization or emergent care d/t complications r/t CHF. Marland Kitchen Continue taking all medications as prescribed. Marland Kitchen  Attend all medical appointments as scheduled. . Weigh daily and record readings. . Monitor BP and record readings. . Continue adherence with recommended cardiac prudent/heart healthy diet. . Follow recommended safety measures to prevent falls and injuries. Over the next 45 days, patient will: . Update care management team if medication assistance is needed.  Interventions:  . Inter-disciplinary care team collaboration (see longitudinal plan of care) . Discussed plan r/t previous right knee surgery. Kimberly Kimberly Henry reports doing well. She no longer requires home health services.  Currently completes physical therapy sessions with [facility]. Denies edema or pain to her right knee. She continues to experience occasional stiffness but reports ambulating well with her cane. Reports wearing compression hose as advised by the Ortho team. Denies recent falls.   . Discussed care management needs. Kimberly Kimberly Henry has met her care management goals. Reports compliance with medications and treatment recommendations. Reports good family support and has been able to perform tasks independently for the past three weeks. Denies urgent concerns or additional care management needs. She is scheduled for PCP outreach next month. Agreed to update her provider if her needs change and additional assistance is required.   Patient Self Care Activities:  . Self administers medications  . Attends scheduled provider appointments . Calls pharmacy for medication refills . Attends church or other social activities . Performs ADL's and IADL's independently . Calls provider office for new concerns or questions   Please see past updates related to this goal by clicking on the "Past Updates" button in the selected goal           PLAN Kimberly Kimberly Henry has met her care management goals. She is scheduled for PCP outreach on 09/24/20. Agreed to update her provider if her needs change and additional care management assistance is required. The team will gladly assist.     [name] Care Management [facility] [phone]

## 2020-08-26 DIAGNOSIS — Z96651 Presence of right artificial knee joint: Secondary | ICD-10-CM | POA: Diagnosis not present

## 2020-08-26 DIAGNOSIS — M25561 Pain in right knee: Secondary | ICD-10-CM | POA: Diagnosis not present

## 2020-08-26 DIAGNOSIS — M25661 Stiffness of right knee, not elsewhere classified: Secondary | ICD-10-CM | POA: Diagnosis not present

## 2020-08-29 DIAGNOSIS — Z96651 Presence of right artificial knee joint: Secondary | ICD-10-CM | POA: Diagnosis not present

## 2020-08-29 DIAGNOSIS — M25561 Pain in right knee: Secondary | ICD-10-CM | POA: Diagnosis not present

## 2020-08-29 DIAGNOSIS — M25661 Stiffness of right knee, not elsewhere classified: Secondary | ICD-10-CM | POA: Diagnosis not present

## 2020-09-02 DIAGNOSIS — M25561 Pain in right knee: Secondary | ICD-10-CM | POA: Diagnosis not present

## 2020-09-02 DIAGNOSIS — Z96651 Presence of right artificial knee joint: Secondary | ICD-10-CM | POA: Diagnosis not present

## 2020-09-02 DIAGNOSIS — M25661 Stiffness of right knee, not elsewhere classified: Secondary | ICD-10-CM | POA: Diagnosis not present

## 2020-09-08 ENCOUNTER — Other Ambulatory Visit: Payer: Self-pay | Admitting: Family

## 2020-09-09 DIAGNOSIS — M25661 Stiffness of right knee, not elsewhere classified: Secondary | ICD-10-CM | POA: Diagnosis not present

## 2020-09-09 DIAGNOSIS — M25561 Pain in right knee: Secondary | ICD-10-CM | POA: Diagnosis not present

## 2020-09-09 DIAGNOSIS — Z96651 Presence of right artificial knee joint: Secondary | ICD-10-CM | POA: Diagnosis not present

## 2020-09-23 DIAGNOSIS — M25561 Pain in right knee: Secondary | ICD-10-CM | POA: Diagnosis not present

## 2020-09-23 DIAGNOSIS — Z96651 Presence of right artificial knee joint: Secondary | ICD-10-CM | POA: Diagnosis not present

## 2020-09-23 DIAGNOSIS — M25661 Stiffness of right knee, not elsewhere classified: Secondary | ICD-10-CM | POA: Diagnosis not present

## 2020-09-24 ENCOUNTER — Ambulatory Visit: Payer: Medicare Other | Admitting: Family Medicine

## 2020-10-02 DIAGNOSIS — M25561 Pain in right knee: Secondary | ICD-10-CM | POA: Diagnosis not present

## 2020-10-02 DIAGNOSIS — M25661 Stiffness of right knee, not elsewhere classified: Secondary | ICD-10-CM | POA: Diagnosis not present

## 2020-10-02 DIAGNOSIS — Z96651 Presence of right artificial knee joint: Secondary | ICD-10-CM | POA: Diagnosis not present

## 2020-10-07 ENCOUNTER — Ambulatory Visit: Payer: Medicare Other | Admitting: Family

## 2020-10-08 DIAGNOSIS — M25661 Stiffness of right knee, not elsewhere classified: Secondary | ICD-10-CM | POA: Diagnosis not present

## 2020-10-08 DIAGNOSIS — M25561 Pain in right knee: Secondary | ICD-10-CM | POA: Diagnosis not present

## 2020-10-08 DIAGNOSIS — Z96651 Presence of right artificial knee joint: Secondary | ICD-10-CM | POA: Diagnosis not present

## 2020-10-20 DIAGNOSIS — Z96651 Presence of right artificial knee joint: Secondary | ICD-10-CM | POA: Diagnosis not present

## 2020-10-20 DIAGNOSIS — M25661 Stiffness of right knee, not elsewhere classified: Secondary | ICD-10-CM | POA: Diagnosis not present

## 2020-10-20 DIAGNOSIS — M25561 Pain in right knee: Secondary | ICD-10-CM | POA: Diagnosis not present

## 2020-10-21 DIAGNOSIS — Z96651 Presence of right artificial knee joint: Secondary | ICD-10-CM | POA: Diagnosis not present

## 2020-10-22 ENCOUNTER — Telehealth: Payer: Self-pay | Admitting: Cardiovascular Disease

## 2020-10-22 NOTE — Telephone Encounter (Signed)
Eft message to call back  [name] PA-C 10/22/2020 11:07 AM

## 2020-10-22 NOTE — Telephone Encounter (Signed)
I called the patient back to discuss preop clearance.  I left a voicemail asking her to turn off called blocking so we could get in touch with her.  [name] PA-C 10/22/2020 12:43 PM

## 2020-10-22 NOTE — Telephone Encounter (Signed)
Patient is returning Kimberly Henry's call. Please advise.

## 2020-10-22 NOTE — Telephone Encounter (Signed)
Patient returned call. Kimberly Henry, [place] unavailable at this time. Made her aware, per previous message, our calls are being blocked. Patient requested a call back to her home phone at [phone].
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
## 2020-10-22 NOTE — Telephone Encounter (Signed)
   Primary Cardiologist: Kathlyn Sacramento, MD  Chart reviewed and patient contacted today by phone as part of pre-operative protocol coverage. Given past medical history and time since last visit, based on ACC/AHA guidelines, Kimberly Henry would be at acceptable risk for the planned procedure without further cardiovascular testing.   OK to hold Eliquis for 3 days pre op, resume as soon as safe post op.  The patient is aware of these instructions.   The patient was advised that if she develops new symptoms prior to surgery to contact our office to arrange for a follow-up visit, and she verbalized understanding.  I will route this recommendation to the requesting party via Epic fax function and remove from pre-op pool.  Please call with questions.  [name], PA-C 10/22/2020, 2:09 PM

## 2020-10-23 ENCOUNTER — Other Ambulatory Visit
Admission: RE | Admit: 2020-10-23 | Discharge: 2020-10-23 | Disposition: A | Discharge status: Home / Self Care | Payer: Medicare Other | Source: Ambulatory Visit | Attending: Orthopedic Surgery | Admitting: Orthopedic Surgery

## 2020-10-23 ENCOUNTER — Encounter: Payer: Self-pay | Admitting: Orthopedic Surgery

## 2020-10-23 ENCOUNTER — Other Ambulatory Visit: Payer: Self-pay

## 2020-10-23 ENCOUNTER — Other Ambulatory Visit: Payer: Self-pay | Admitting: Orthopedic Surgery

## 2020-10-23 NOTE — Patient Instructions (Signed)
INSTRUCTIONS FOR SURGERY     Your surgery is scheduled for:   Wednesday, February 16TH     To find out your arrival time for the day of surgery,          please call [phone] between 1 pm and 3 pm on : Tuesday, February 15TH     When you arrive for surgery, report to the     REMEMBER: Instructions that are not followed completely may result in serious medical risk,  up to and including death, or upon the discretion of your surgeon and anesthesiologist,            your surgery may need to be rescheduled.  __X__ 1. Do not eat food after midnight the night before your procedure.                    No gum, candy, lozenger, tic tacs, tums or hard candies.                  ABSOLUTELY NOTHING SOLID IN YOUR MOUTH AFTER MIDNIGHT                    You may drink unlimited clear liquids up to 2 hours before you are scheduled to arrive for surgery.                   Do not drink anything within those 2 hours unless you need to take medicine, then take the                   smallest amount you need.  Clear liquids include:  water, apple juice without pulp,                   any flavor Gatorade, Black coffee, black tea.  Sugar may be added but no dairy/ honey /lemon.                        Broth and jello is not considered a clear liquid.  __x__  2. On the morning of surgery, please brush your teeth with toothpaste and water. You may rinse with                  mouthwash if you wish but DO NOT SWALLOW TOOTHPASTE OR MOUTHWASH  __X___3. NO alcohol for 24 hours before or after surgery.  __x___ 4.  Do NOT smoke or use e-cigarettes for 24 HOURS PRIOR TO SURGERY.                      DO NOT Use any chewable tobacco products for at least 6 hours prior to surgery.  __x___ 5. If you start any new medication after this appointment and prior to surgery, please                   Bring it with you on the day of surgery.  ___x__ 6. Notify your doctor  if there is any change in your medical condition, such as fever, infection, vomitting,  Diarrhea or any open sores.  __x___ 7.  USE the CHG SOAP as instructed, the night before surgery and the day of surgery.                   Once you have washed with this soap, do NOT use any of the following: Powders, perfumes                    or lotions. Please do not wear make up, hairpins, clips or nail polish. You MAY wear deodorant.                   Men may shave their face and neck.  Women need to shave 48 hours prior to surgery.                   DO NOT wear ANY jewelry on the day of surgery. If there are rings that are too tight to                    remove easily, please address this prior to the surgery day. Piercings need to be removed.                                                                     NO METAL ON YOUR BODY.                    Do NOT bring any valuables.  If you came to Pre-Admit testing then you will not need license,                     insurance card or credit card.  If you will be staying overnight, please either leave your things in                     the car or have your family be responsible for these items.                     [place] IS NOT RESPONSIBLE FOR BELONGINGS OR VALUABLES.  ___X__ 8. DO NOT wear contact lenses on surgery day.  You may not have dentures,                     Hearing aides, contacts or glasses in the operating room. These items can be                    Placed in the Recovery Room to receive immediately after surgery.  __x___ 9. IF YOU ARE SCHEDULED TO GO HOME ON THE SAME DAY, YOU MUST                   Have someone to drive you home and to stay with you  for the first 24 hours.                    Have an arrangement prior to arriving on surgery day.  ___x__ 10. Take the following medications on the morning of surgery with a sip of water:  1.                     2.                     3.                      4.                     5.                     6.  _____ 11.  Follow any instructions provided to you by your surgeon.                        Such as enema, clear liquid bowel prep  __X__  12. STOP COUMADIN / PLAVIX / ELIQUIS / ASPIRIN AS OF:                       THIS INCLUDES BC POWDERS / GOODIES POWDER  __x___ 13. STOP Anti-inflammatories as of:                       This includes IBUPROFEN / MOTRIN / ADVIL / ALEVE/ NAPROXYN                    YOU MAY TAKE TYLENOL ANY TIME PRIOR TO SURGERY.  _____ 16.  Stop supplements until after surgery.                     This includes:                 You may continue taking Vitamin B12 / Vitamin D3 but do not take on the morning of surgery.  ___X___17.  Continue to take the following medications but do not take on the morning of surgery:                           LASIX // POTASSIUM // ALDACTONE // ENTRENSTO   ______18.  Wear clean and comfortable clothing to the hospital.

## 2020-10-23 NOTE — Patient Instructions (Signed)
INSTRUCTIONS FOR SURGERY     Your surgery is scheduled for:   Wednesday, February 16TH     To find out your arrival time for the day of surgery,          please call [phone] between 1 pm and 3 pm on :  Tuesday, February 15TH     When you arrive for surgery, report to the [facility].  ONCE      THEY HAVE COMPLETED THEIR PROCESS, GO TO THE SECOND FLOOR AND SIGN IN       AT THE SURGERY DESK.    REMEMBER: Instructions that are not followed completely may result in serious medical risk,  up to and including death, or upon the discretion of your surgeon and anesthesiologist,            your surgery may need to be rescheduled.  __X__ 1. Do not eat food after midnight the night before your procedure.                    No gum, candy, lozenger, tic tacs, tums or hard candies.                  ABSOLUTELY NOTHING SOLID IN YOUR MOUTH AFTER MIDNIGHT                    You may drink unlimited clear liquids up to 2 hours before you are scheduled to arrive for surgery.                   Do not drink anything within those 2 hours unless you need to take medicine, then take the                   smallest amount you need.  Clear liquids include:  water, apple juice without pulp,                   any flavor Gatorade, Black coffee, black tea.  Sugar may be added but no dairy/ honey /lemon.                        Broth and jello is not considered a clear liquid.  __x__  2. On the morning of surgery, please brush your teeth with toothpaste and water. You may rinse with                  mouthwash if you wish but DO NOT SWALLOW TOOTHPASTE OR MOUTHWASH  __X___3. NO alcohol for 24 hours before or after surgery.  __x___ 4.  Do NOT smoke or use e-cigarettes for 24 HOURS PRIOR TO SURGERY.                      DO NOT Use any chewable tobacco products for at least 6 hours prior to surgery.  __x___ 5. If you start any new  medication after this appointment and prior to surgery, please                   Bring it with you on the  day of surgery.  ___x__ 6. Notify your doctor if there is any change in your medical condition, such as fever, infection, vomitting,                   Diarrhea or any open sores.  __x___ 7.  USE the CHG SOAP as instructed, the night before surgery and the day of surgery.                   Once you have washed with this soap, do NOT use any of the following: Powders, perfumes                    or lotions. Please do not wear make up, hairpins, clips or nail polish. You MAY wear deodorant.                   Men may shave their face and neck.  Women need to shave 48 hours prior to surgery.                   DO NOT wear ANY jewelry on the day of surgery. If there are rings that are too tight to                    remove easily, please address this prior to the surgery day. Piercings need to be removed.                                                                     NO METAL ON YOUR BODY.                    Do NOT bring any valuables.  If you came to Pre-Admit testing then you will not need license,                     insurance card or credit card.  If you will be staying overnight, please either leave your things in                     the car or have your family be responsible for these items.                     [place] IS NOT RESPONSIBLE FOR BELONGINGS OR VALUABLES.  ___X__ 8. DO NOT wear contact lenses on surgery day.  You may not have dentures,                     Hearing aides, contacts or glasses in the operating room. These items can be                    Placed in the Recovery Room to receive immediately after surgery.  __x___ 9. IF YOU ARE SCHEDULED TO GO HOME ON THE SAME DAY, YOU MUST                   Have someone to drive you home and to stay with you  for the first 24 hours.                    Have an  arrangement prior to arriving on surgery day.  ___x__ 10. Take the  following medications on the morning of surgery with a sip of water:                              1. CARVEDILOL                     2. DIGOXIN                     3.                     _____ 11.  Follow any instructions provided to you by your surgeon.                        Such as enema, clear liquid bowel prep  __X__  12. STOP ALL ASPIRIN PRODUCTS AS OF TODAY, February 10TH                       THIS INCLUDES BC POWDERS / [place] 3 DAYS PRIOR TO SURGERY.  LAST DOSE                    AFTER EVENING DOSE ON Saturday, February 12TH.  __x___ 13. STOP Anti-inflammatories as of TODAY, February 10TH                      This includes IBUPROFEN / MOTRIN / ADVIL / ALEVE/ NAPROXYN                    YOU MAY TAKE TYLENOL ANY TIME PRIOR TO SURGERY.  __X____17.  Continue to take the following medications but do not take on the morning of surgery:                          LASIX // POTASSIUM // ENTRESTO // ALDACTONE  __X____18. Wear clean and comfortable clothing to the hospital.

## 2020-10-24 ENCOUNTER — Encounter: Payer: Self-pay | Admitting: Orthopedic Surgery

## 2020-10-24 NOTE — Progress Notes (Signed)
  Perioperative Services: Pre-Admission/Anesthesia Testing     Date: 10/24/20  Name: Kimberly Henry MRN:   [id number]  Re: Clearance for surgery   Case: [id number] Date/Time: 10/29/20 1443   Procedure: CLOSED MANIPULATION KNEE (Right Knee)   Anesthesia type: General   Pre-op diagnosis: Z96.651 Presence of right artificial knee joint   Location: [facility] OR ROOM 02 / [place] ORS FOR ANESTHESIA GROUP   Surgeons: Kimberly Sheehan, MD    Patient is scheduled for the above procedure on 10/29/2020 with Dr. Kurtis Henry.  Patient previously reviewed for presurgical anesthesia clearance on 06/24/2020; see PAT APP note.  Patient underwent an uneventful right TKA on 07/02/2020.  There have been no significant changes to patient's past medical history since her previous surgical procedure.  Given patient's past medical history, patient was resubmitted for cardiac clearance through her primary cardiologist. Per cardiology, "based on patient's past medical history and time since his last clinic visit, patient would be at an overall ACCEPTABLE risk for the planned procedure without further cardiovascular testing or intervention at this time".  This patient remains on daily anticoagulation therapy for treatment of her atrial fibrillation diagnosis.  Patient has been made aware of recommendations from her cardiologist for holding her apixaban for 3 days prior to her procedure with plans to resume as soon as postoperative bleeding risk felt to be minimized by attending surgeon.  Impression and Plan:  Kimberly Henry has been referred for pre-anesthesia review and clearance prior to her undergoing the planned anesthetic and procedural courses. Available labs, pertinent testing, and imaging results were personally reviewed by me. This patient has been appropriately cleared by cardiology with an overall ACCEPTABLE risk of significant perioperative cardiovascular complications.  Based on clinical review previously performed,  and review of interval records, barring any significant acute changes in the patient's overall condition, it is anticipated that she will be able to proceed with the planned surgical intervention. Any acute changes in clinical condition may necessitate her procedure being postponed and/or cancelled. Pre-surgical instructions were reviewed with the patient during her PAT appointment and questions were fielded by PAT clinical staff.  Kimberly Loh, MSN, APRN, FNP-C, CEN [name]  Peri-operative Services Nurse Practitioner Phone: [phone] 10/24/20, 10:33 AM  NOTE: This note has been prepared using Dragon dictation software. Despite my best ability to proofread, there is always the potential that unintentional transcriptional errors may still occur from this process.

## 2020-10-27 ENCOUNTER — Other Ambulatory Visit: Payer: Self-pay

## 2020-10-27 ENCOUNTER — Other Ambulatory Visit
Admission: RE | Admit: 2020-10-27 | Discharge: 2020-10-27 | Disposition: A | Discharge status: Home / Self Care | Payer: Medicare Other | Source: Ambulatory Visit | Attending: Orthopedic Surgery | Admitting: Orthopedic Surgery

## 2020-10-27 DIAGNOSIS — Z20822 Contact with and (suspected) exposure to covid-19: Secondary | ICD-10-CM | POA: Diagnosis not present

## 2020-10-27 DIAGNOSIS — Z01812 Encounter for preprocedural laboratory examination: Secondary | ICD-10-CM | POA: Insufficient documentation

## 2020-10-27 LAB — SARS CORONAVIRUS 2 (TAT 6-24 HRS): SARS Coronavirus 2: NEGATIVE

## 2020-10-28 ENCOUNTER — Ambulatory Visit: Payer: Medicare Other | Admitting: Family

## 2020-10-28 MED ORDER — LACTATED RINGERS IV SOLN
INTRAVENOUS | Status: DC
  Administered 2020-10-29: 100 mL/h via INTRAVENOUS

## 2020-10-28 MED ORDER — LACTATED RINGERS IV SOLN: INTRAVENOUS | Status: DC

## 2020-10-28 MED ORDER — ORAL CARE MOUTH RINSE: 15.0000 mL | Freq: Once | OROMUCOSAL | Status: AC

## 2020-10-28 MED ORDER — CHLORHEXIDINE GLUCONATE 0.12 % MT SOLN
15.0000 mL | Freq: Once | OROMUCOSAL | Status: AC
  Administered 2020-10-29: 15 mL via OROMUCOSAL

## 2020-10-28 MED ORDER — FAMOTIDINE 20 MG PO TABS: 20.0000 mg | ORAL_TABLET | Freq: Once | ORAL | Status: AC

## 2020-10-29 ENCOUNTER — Ambulatory Visit: Payer: Medicare Other | Admitting: Urgent Care

## 2020-10-29 ENCOUNTER — Other Ambulatory Visit: Payer: Self-pay

## 2020-10-29 ENCOUNTER — Ambulatory Visit
Admission: RE | Admit: 2020-10-29 | Discharge: 2020-10-29 | Disposition: A | Discharge status: Home / Self Care | Payer: Medicare Other | Attending: Orthopedic Surgery | Admitting: Orthopedic Surgery

## 2020-10-29 ENCOUNTER — Encounter: Payer: Self-pay | Admitting: Orthopedic Surgery

## 2020-10-29 ENCOUNTER — Encounter
Admission: RE | Disposition: A | Discharge status: Home / Self Care | Payer: Self-pay | Source: Home / Self Care | Attending: Orthopedic Surgery

## 2020-10-29 DIAGNOSIS — Z87891 Personal history of nicotine dependence: Secondary | ICD-10-CM | POA: Insufficient documentation

## 2020-10-29 DIAGNOSIS — I2510 Atherosclerotic heart disease of native coronary artery without angina pectoris: Secondary | ICD-10-CM | POA: Diagnosis not present

## 2020-10-29 DIAGNOSIS — Z96651 Presence of right artificial knee joint: Secondary | ICD-10-CM | POA: Diagnosis not present

## 2020-10-29 DIAGNOSIS — M25661 Stiffness of right knee, not elsewhere classified: Secondary | ICD-10-CM | POA: Diagnosis not present

## 2020-10-29 DIAGNOSIS — Z90710 Acquired absence of both cervix and uterus: Secondary | ICD-10-CM | POA: Insufficient documentation

## 2020-10-29 DIAGNOSIS — I5022 Chronic systolic (congestive) heart failure: Secondary | ICD-10-CM | POA: Diagnosis not present

## 2020-10-29 DIAGNOSIS — I4891 Unspecified atrial fibrillation: Secondary | ICD-10-CM | POA: Diagnosis not present

## 2020-10-29 DIAGNOSIS — I110 Hypertensive heart disease with heart failure: Secondary | ICD-10-CM | POA: Diagnosis not present

## 2020-10-29 DIAGNOSIS — E785 Hyperlipidemia, unspecified: Secondary | ICD-10-CM | POA: Diagnosis not present

## 2020-10-29 DIAGNOSIS — J449 Chronic obstructive pulmonary disease, unspecified: Secondary | ICD-10-CM | POA: Diagnosis not present

## 2020-10-29 HISTORY — DX: Atherosclerosis of aorta: I70.0

## 2020-10-29 HISTORY — PX: KNEE CLOSED REDUCTION: SHX995

## 2020-10-29 SURGERY — MANIPULATION, KNEE, CLOSED
Anesthesia: General | Site: Knee | Laterality: Right

## 2020-10-29 MED ORDER — LIDOCAINE HCL (CARDIAC) PF 100 MG/5ML IV SOSY
PREFILLED_SYRINGE | INTRAVENOUS | Status: DC | PRN
  Administered 2020-10-29: 100 mg via INTRAVENOUS

## 2020-10-29 MED ORDER — METOCLOPRAMIDE HCL 10 MG PO TABS: 5.0000 mg | ORAL_TABLET | Freq: Three times a day (TID) | ORAL | Status: DC | PRN

## 2020-10-29 MED ORDER — ONDANSETRON HCL 4 MG PO TABS: 4.0000 mg | ORAL_TABLET | Freq: Four times a day (QID) | ORAL | Status: DC | PRN

## 2020-10-29 MED ORDER — PROPOFOL 10 MG/ML IV BOLUS
INTRAVENOUS | Status: DC | PRN
  Administered 2020-10-29: 100 mg via INTRAVENOUS

## 2020-10-29 MED ORDER — KETOROLAC TROMETHAMINE 15 MG/ML IJ SOLN: 15.0000 mg | Freq: Four times a day (QID) | INTRAMUSCULAR | Status: DC

## 2020-10-29 MED ORDER — OXYCODONE-ACETAMINOPHEN 5-325 MG PO TABS: 1.0000 | ORAL_TABLET | ORAL | 0 refills | Status: DC | PRN

## 2020-10-29 MED ORDER — HYDROCODONE-ACETAMINOPHEN 5-325 MG PO TABS: 1.0000 | ORAL_TABLET | ORAL | Status: DC | PRN

## 2020-10-29 MED ORDER — MORPHINE SULFATE (PF) 2 MG/ML IV SOLN: 0.5000 mg | INTRAVENOUS | Status: DC | PRN

## 2020-10-29 MED ORDER — FENTANYL CITRATE (PF) 100 MCG/2ML IJ SOLN: 25.0000 ug | INTRAMUSCULAR | Status: DC | PRN

## 2020-10-29 MED ORDER — METOCLOPRAMIDE HCL 5 MG/ML IJ SOLN: 5.0000 mg | Freq: Three times a day (TID) | INTRAMUSCULAR | Status: DC | PRN

## 2020-10-29 MED ORDER — PROPOFOL 10 MG/ML IV BOLUS
INTRAVENOUS | Status: AC
  Filled 2020-10-29: qty 40

## 2020-10-29 MED ORDER — ONDANSETRON HCL 4 MG/2ML IJ SOLN: 4.0000 mg | Freq: Four times a day (QID) | INTRAMUSCULAR | Status: DC | PRN

## 2020-10-29 MED ORDER — FAMOTIDINE 20 MG PO TABS
ORAL_TABLET | ORAL | Status: AC
  Administered 2020-10-29: 20 mg via ORAL
  Filled 2020-10-29: qty 1

## 2020-10-29 MED ORDER — LACTATED RINGERS IV SOLN: INTRAVENOUS | Status: DC

## 2020-10-29 MED ORDER — HYDROCODONE-ACETAMINOPHEN 7.5-325 MG PO TABS: 1.0000 | ORAL_TABLET | ORAL | Status: DC | PRN

## 2020-10-29 MED ORDER — ACETAMINOPHEN 325 MG PO TABS: 325.0000 mg | ORAL_TABLET | Freq: Four times a day (QID) | ORAL | Status: DC | PRN

## 2020-10-29 MED ORDER — ONDANSETRON HCL 4 MG/2ML IJ SOLN: 4.0000 mg | Freq: Once | INTRAMUSCULAR | Status: DC | PRN

## 2020-10-29 MED ORDER — LIDOCAINE HCL (PF) 2 % IJ SOLN
INTRAMUSCULAR | Status: AC
  Filled 2020-10-29: qty 5

## 2020-10-29 NOTE — Op Note (Signed)
10/29/2020  3:55 PM  PATIENT:  Kimberly Henry  80 y.o. female  PRE-OPERATIVE DIAGNOSIS:  Z96.651 Presence of right artificial knee joint, stiffness  POST-OPERATIVE DIAGNOSIS:  Z96.651 Presence of right artificial knee joint, stiffness  PROCEDURE:  Procedure(s): CLOSED MANIPULATION KNEE (Right)  SURGEON:  Surgeon(s) and Role:    Lovell Sheehan, MD - Primary  ANESTHESIA:   general  EBL:  No intake/output data recorded.   DICTATION: After informed consent and extremity marked, the patient was taken to operating room. Sedation was given by the anesthesia team. The knee had a ROM from 0 to 85 degrees. The knee was gently manipulated with hip flexion and had a final flexion of 0 to 110 degrees. The ligaments were stable. There was excellent patellar tracking. She was taken to the recovery room in good condition.

## 2020-10-29 NOTE — H&P (Signed)
The patient has been re-examined, and the chart reviewed, and there have been no interval changes to the documented history and physical.  Plan a right knee manipulation today.  Anesthesia is consulted regarding a peripheral nerve block for post-operative pain.  The risks, benefits, and alternatives have been discussed at length, and the patient is willing to proceed.    

## 2020-10-29 NOTE — Anesthesia Preprocedure Evaluation (Signed)
Anesthesia Evaluation  Patient identified by MRN, date of birth, ID band Patient awake    Reviewed: Allergy & Precautions, H&P , NPO status , Patient's Chart, lab work & pertinent test results, reviewed documented beta blocker date and time   Airway Mallampati: II   Neck ROM: full    Dental  (+) Poor Dentition   Pulmonary COPD, former smoker,    Pulmonary exam normal        Cardiovascular Exercise Tolerance: Poor hypertension, On Medications + CAD  Normal cardiovascular exam+ dysrhythmias  Rhythm:regular Rate:Normal     Neuro/Psych negative neurological ROS  negative psych ROS   GI/Hepatic negative GI ROS, Neg liver ROS,   Endo/Other  negative endocrine ROS  Renal/GU negative Renal ROS  negative genitourinary   Musculoskeletal   Abdominal   Peds  Hematology  (+) Blood dyscrasia, anemia ,   Anesthesia Other Findings Past Medical History: No date: Acid phosphatase elevated No date: Aortic atherosclerosis (HCC) No date: Atrial fibrillation ([place]) No date: AV block, 1st degree No date: COPD (chronic obstructive pulmonary disease) (HCC) No date: HFrEF (heart failure with reduced ejection fraction) ([place])     Comment:  a. 04/2017 Echo: EF 30-35%, antsept HK, mild MR, mod dil               LA, mildly dil RA, mod TR, mildly to mod elev PASP; b.               08/2017 Echo: EF 30-35%, diff HK, antsept HK. Gr1 DD.               Mild to mod MR. PASP nl. No date: Hx of cervical malignancy No date: Hyperlipidemia No date: Hypertension No date: LBBB (left bundle branch block) No date: Metabolic syndrome No date: NICM (nonischemic cardiomyopathy) ([place])     Comment:  a. 04/2017 Echo: EF 30-35%; b. 04/2017 Cath: nonobs dzs;               c. 08/2017 Echo: EF 30-35%, diff HK, antsept HK. Gr1 DD.               Mild to mod MR. PASP nl. No date: Non-obstructive CAD (coronary artery disease)     Comment:  a. 04/2017 Cath: mild nonobs  CAD. EF 25-35%. CO 3.14, CI               1.38. Sev PAH (61/34(47). No date: Obesity, Class III, BMI 40-49.9 (morbid obesity) ([place]) No date: Osteoarthritis of both knees No date: PAH (pulmonary arterial hypertension) with portal  hypertension ([place])     Comment:  a. 04/2017 Right Heart Cath: Sev PAH 61/34(47); b.               08/2017 Echo: nl PASP. Past Surgical History: 1996: ABDOMINAL HYSTERECTOMY 2018: CARDIAC CATHETERIZATION     Comment:  d/t CHF 2008: CATARACT EXTRACTION; Left 06/2020: JOINT REPLACEMENT; Right 04/21/2017: RIGHT/LEFT HEART CATH AND CORONARY ANGIOGRAPHY; N/A     Comment:  Procedure: RIGHT/LEFT HEART CATH AND CORONARY               ANGIOGRAPHY;  Surgeon: Wellington Hampshire, MD;  Location:               [place] [facility];  Service: Cardiovascular;                Laterality: N/A; 07/02/2020: TOTAL KNEE ARTHROPLASTY; Right     Comment:  Procedure: TOTAL KNEE ARTHROPLASTY;  Surgeon: Harlow Mares,  Elyn Aquas, MD;  Location: [facility];  Service: Orthopedics;               Laterality: Right; BMI    Body Mass Index: 38.36 kg/m     Reproductive/Obstetrics negative OB ROS                             Anesthesia Physical Anesthesia Plan  ASA: III  Anesthesia Plan: General   Post-op Pain Management:    Induction:   PONV Risk Score and Plan:   Airway Management Planned:   Additional Equipment:   Intra-op Plan:   Post-operative Plan:   Informed Consent: I have reviewed the patients History and Physical, chart, labs and discussed the procedure including the risks, benefits and alternatives for the proposed anesthesia with the patient or authorized representative who has indicated his/her understanding and acceptance.     Dental Advisory Given  Plan Discussed with: CRNA  Anesthesia Plan Comments:         Anesthesia Quick Evaluation

## 2020-10-29 NOTE — Transfer of Care (Signed)
Immediate Anesthesia Transfer of Care Note  Patient: [place]  Procedure(s) Performed: CLOSED MANIPULATION KNEE (Right Knee)  Patient Location: PACU  Anesthesia Type:General  Level of Consciousness: awake, alert  and oriented  Airway & Oxygen Therapy: Patient Spontanous Breathing  Post-op Assessment: Report given to RN and Post -op Vital signs reviewed and stable  Post vital signs: Reviewed and stable  Last Vitals:  Vitals Value Taken Time  BP 144/74 10/29/20 1556  Temp 36.9 C 10/29/20 1556  Pulse 52 10/29/20 1601  Resp 16 10/29/20 1601  SpO2 97 % 10/29/20 1601  Vitals shown include unvalidated device data.  Last Pain:  Vitals:   10/29/20 1556  TempSrc:   PainSc: 0-No pain      Patients Stated Pain Goal: 1 (01/21/01 1117)  Complications: No complications documented.

## 2020-10-29 NOTE — Discharge Instructions (Signed)
AMBULATORY SURGERY  DISCHARGE INSTRUCTIONS   1) The drugs that you were given will stay in your system until tomorrow so for the next 24 hours you should not:  A) Drive an automobile B) Make any legal decisions C) Drink any alcoholic beverage   2) You may resume regular meals tomorrow.  Today it is better to start with liquids and gradually work up to solid foods.  You may eat anything you prefer, but it is better to start with liquids, then soup and crackers, and gradually work up to solid foods.   3) Please notify your doctor immediately if you have any unusual bleeding, trouble breathing, redness and pain at the surgery site, drainage, fever, or pain not relieved by medication. 4)   5) Your post-operative visit with Dr.                                     is: Date:                        Time:    Please call to schedule your post-operative visit.  6) Additional Instructions:       PATIENT ALREADY HAS FOLLOW-UP AND PHYSICAL THERAPY SCHEDULED  1) Do not sit for longer than 1 hour at a time with your leg dangling down.  You should have your legs elevated (higher than your heart) in a recliner chair or couch.  2) You may be up walking around as tolerated but should take periodic breaks to elevate your legs.  Discontinue use of crutches when you feel you are able to walk without pain or a limp.  3) Work on gentle bending and straightening of the knee.  4) Continue your physical therapy exercises, as shown at the office, at least twice daily.  You should set up outpatient physical therapy and start within the first week after surgery.  5) Continue to use your Polar Pack continuously for 7-10 days after surgery.    6) Do not be surprised if you have increased pain at night.  This usually means you have been a little too active during the day and need to reduce your activities.  10) If you develop lower extremity swelling that does not improve after a night of elevation,  please call the office.  This could be an early sign of a blood clot.  Please call with any questions at [phone]

## 2020-10-30 ENCOUNTER — Encounter: Payer: Self-pay | Admitting: Orthopedic Surgery

## 2020-10-30 DIAGNOSIS — Z96651 Presence of right artificial knee joint: Secondary | ICD-10-CM | POA: Diagnosis not present

## 2020-10-30 DIAGNOSIS — M25561 Pain in right knee: Secondary | ICD-10-CM | POA: Diagnosis not present

## 2020-10-30 DIAGNOSIS — M25661 Stiffness of right knee, not elsewhere classified: Secondary | ICD-10-CM | POA: Diagnosis not present

## 2020-11-02 NOTE — Anesthesia Postprocedure Evaluation (Signed)
Anesthesia Post Note  Patient: Kimberly Henry  Procedure(s) Performed: CLOSED MANIPULATION KNEE (Right Knee)  Patient location during evaluation: PACU Anesthesia Type: General Level of consciousness: awake and alert Pain management: pain level controlled Vital Signs Assessment: post-procedure vital signs reviewed and stable Respiratory status: spontaneous breathing, nonlabored ventilation, respiratory function stable and patient connected to nasal cannula oxygen Cardiovascular status: blood pressure returned to baseline and stable Postop Assessment: no apparent nausea or vomiting Anesthetic complications: no   No complications documented.   Last Vitals:  Vitals:   10/29/20 1630 10/29/20 1645  BP: (!) 148/67 129/63  Pulse: 96 (!) 53  Resp: (!) 22 16  Temp: 36.7 C   SpO2: (!) 51% 99%    Last Pain:  Vitals:   10/29/20 1645  TempSrc:   PainSc: 0-No pain                 Molli Barrows

## 2020-11-04 DIAGNOSIS — Z96651 Presence of right artificial knee joint: Secondary | ICD-10-CM | POA: Diagnosis not present

## 2020-11-04 DIAGNOSIS — M25661 Stiffness of right knee, not elsewhere classified: Secondary | ICD-10-CM | POA: Diagnosis not present

## 2020-11-04 DIAGNOSIS — M25561 Pain in right knee: Secondary | ICD-10-CM | POA: Diagnosis not present

## 2020-11-07 ENCOUNTER — Ambulatory Visit: Payer: Medicare Other | Admitting: Family

## 2020-11-07 DIAGNOSIS — M25661 Stiffness of right knee, not elsewhere classified: Secondary | ICD-10-CM | POA: Diagnosis not present

## 2020-11-07 DIAGNOSIS — M25561 Pain in right knee: Secondary | ICD-10-CM | POA: Diagnosis not present

## 2020-11-07 DIAGNOSIS — Z96651 Presence of right artificial knee joint: Secondary | ICD-10-CM | POA: Diagnosis not present

## 2020-11-09 ENCOUNTER — Other Ambulatory Visit: Payer: Self-pay | Admitting: Family Medicine

## 2020-11-09 NOTE — Telephone Encounter (Signed)
Requested Prescriptions  Pending Prescriptions Disp Refills  . rosuvastatin (CRESTOR) 40 MG tablet [Pharmacy Med Name: Rosuvastatin Calcium 40 MG Oral Tablet] 90 tablet 0    Sig: TAKE 1 TABLET BY MOUTH  DAILY     Cardiovascular:  Antilipid - Statins Failed - 11/09/2020  4:54 AM      Failed - LDL in normal range and within 360 days    LDL Cholesterol (Calc)  Date Value Ref Range Status  01/09/2020 104 (H) mg/dL (calc) Final    Comment:    Reference range: <100 . Desirable range <100 mg/dL for primary prevention;   <70 mg/dL for patients with CHD or diabetic patients  with > or = 2 CHD risk factors. Marland Kitchen LDL-C is now calculated using the Martin-Hopkins  calculation, which is a validated novel method providing  better accuracy than the Friedewald equation in the  estimation of LDL-C.  Cresenciano Genre et al. Annamaria Helling. 0233;435(68): 2061-2068  (http://education.[employer].com/faq/FAQ164)          Failed - HDL in normal range and within 360 days    HDL  Date Value Ref Range Status  01/09/2020 40 (L) > OR = 50 mg/dL Final         Passed - Total Cholesterol in normal range and within 360 days    Cholesterol  Date Value Ref Range Status  01/09/2020 163 <200 mg/dL Final         Passed - Triglycerides in normal range and within 360 days    Triglycerides  Date Value Ref Range Status  01/09/2020 91 <150 mg/dL Final         Passed - Patient is not pregnant      Passed - Valid encounter within last 12 months    Recent Outpatient Visits          4 months ago [facility] Delsa Grana, PA-C   10 months ago Aortic atherosclerosis [facility])   [facility] Steele Sizer, MD   1 year ago Chronic systolic heart failure [facility])   [facility] Steele Sizer, MD   2 years ago Chronic systolic heart failure [facility])   [facility] Steele Sizer, MD   3 years ago Hospital discharge follow-up   [facility] Steele Sizer, MD      Future Appointments            In 6 months [facility], [facility]

## 2020-11-14 DIAGNOSIS — Z96651 Presence of right artificial knee joint: Secondary | ICD-10-CM | POA: Diagnosis not present

## 2020-11-14 DIAGNOSIS — M25561 Pain in right knee: Secondary | ICD-10-CM | POA: Diagnosis not present

## 2020-11-14 DIAGNOSIS — M25661 Stiffness of right knee, not elsewhere classified: Secondary | ICD-10-CM | POA: Diagnosis not present

## 2020-11-19 DIAGNOSIS — M25661 Stiffness of right knee, not elsewhere classified: Secondary | ICD-10-CM | POA: Diagnosis not present

## 2020-11-19 DIAGNOSIS — M25561 Pain in right knee: Secondary | ICD-10-CM | POA: Diagnosis not present

## 2020-11-19 DIAGNOSIS — Z96651 Presence of right artificial knee joint: Secondary | ICD-10-CM | POA: Diagnosis not present

## 2020-11-21 DIAGNOSIS — M25661 Stiffness of right knee, not elsewhere classified: Secondary | ICD-10-CM | POA: Diagnosis not present

## 2020-11-21 DIAGNOSIS — Z96651 Presence of right artificial knee joint: Secondary | ICD-10-CM | POA: Diagnosis not present

## 2020-11-21 DIAGNOSIS — M25561 Pain in right knee: Secondary | ICD-10-CM | POA: Diagnosis not present

## 2020-11-24 DIAGNOSIS — Z96651 Presence of right artificial knee joint: Secondary | ICD-10-CM | POA: Diagnosis not present

## 2020-11-24 DIAGNOSIS — M25561 Pain in right knee: Secondary | ICD-10-CM | POA: Diagnosis not present

## 2020-11-24 DIAGNOSIS — M25661 Stiffness of right knee, not elsewhere classified: Secondary | ICD-10-CM | POA: Diagnosis not present

## 2020-11-26 DIAGNOSIS — M25661 Stiffness of right knee, not elsewhere classified: Secondary | ICD-10-CM | POA: Diagnosis not present

## 2020-11-26 DIAGNOSIS — M25561 Pain in right knee: Secondary | ICD-10-CM | POA: Diagnosis not present

## 2020-11-26 DIAGNOSIS — Z96651 Presence of right artificial knee joint: Secondary | ICD-10-CM | POA: Diagnosis not present

## 2020-11-28 DIAGNOSIS — M25561 Pain in right knee: Secondary | ICD-10-CM | POA: Diagnosis not present

## 2020-11-28 DIAGNOSIS — M25661 Stiffness of right knee, not elsewhere classified: Secondary | ICD-10-CM | POA: Diagnosis not present

## 2020-11-28 DIAGNOSIS — Z96651 Presence of right artificial knee joint: Secondary | ICD-10-CM | POA: Diagnosis not present

## 2020-11-28 NOTE — Progress Notes (Signed)
Patient ID: Kimberly Henry, female    DOB: [birth year], 80 y.o.   MRN: [id number]  HPI  Kimberly Henry is a 80 y/o female with a history of AV block, COPD, hyperlipidemia, HTN, osteoarthritis, remote tobacco use and chronic heart failure.  Echo report from 01/22/20 reviewed and showed an EF of 50-55% along with mildly elevated PA pressure. Echo report from 08/26/17 reviewed and showed an EF of 30-35% along with moderate MR and normal PA pressure. Echo from 04/19/17 reviewed and shows an EF of 30-35% along with mild MR and moderate TR.   Right/Left Cardiac catheterization was done 04/21/17 and showed mild nonobstructive CAD with an EF of 25-35%. Moderately elevated filling pressure, severe pulmonary HTN and severely reduced cardiac output at 3.14 with a cardiac index of 1.38. PA pressure was 61/34 with a mean of 47 mm Hg.   Admitted 07/03/20 due to right knee surgery. Discharged after 2 days.   She presents today for a follow up visit with a chief complaint of moderate fatigue with minimal exertion. She describes this as chronic in nature having been present for several years. She has associated knee pain along with this. She denies any difficulty sleeping, dizziness, abdominal distention, palpitations, pedal edema, chest pina, shortness of breath or weight gain.   Had her right knee replaced and says that she's done well recovering from that.   Past Medical History:  Diagnosis Date  . Acid phosphatase elevated   . Aortic atherosclerosis ([place])   . Atrial fibrillation ([place])   . AV block, 1st degree   . COPD (chronic obstructive pulmonary disease) ([place])   . HFrEF (heart failure with reduced ejection fraction) ([place])    a. 04/2017 Echo: EF 30-35%, antsept HK, mild MR, mod dil LA, mildly dil RA, mod TR, mildly to mod elev PASP; b. 08/2017 Echo: EF 30-35%, diff HK, antsept HK. Gr1 DD. Mild to mod MR. PASP nl.  . Hx of cervical malignancy   . Hyperlipidemia   . Hypertension   . LBBB (left bundle branch  block)   . Metabolic syndrome   . NICM (nonischemic cardiomyopathy) ([place])    a. 04/2017 Echo: EF 30-35%; b. 04/2017 Cath: nonobs dzs; c. 08/2017 Echo: EF 30-35%, diff HK, antsept HK. Gr1 DD. Mild to mod MR. PASP nl.  . Non-obstructive CAD (coronary artery disease)    a. 04/2017 Cath: mild nonobs CAD. EF 25-35%. CO 3.14, CI 1.38. Sev PAH [61/34(47].  . Obesity, Class III, BMI 40-49.9 (morbid obesity) ([place])   . Osteoarthritis of both knees   . PAH (pulmonary arterial hypertension) with portal hypertension ([place])    a. 04/2017 Right Heart Cath: Sev PAH 61/34(47); b. 08/2017 Echo: nl PASP.   Past Surgical History:  Procedure Laterality Date  . ABDOMINAL HYSTERECTOMY  1996  . CARDIAC CATHETERIZATION  2018   d/t CHF  . CATARACT EXTRACTION Left 2008  . JOINT REPLACEMENT Right 06/2020  . KNEE CLOSED REDUCTION Right 10/29/2020   Procedure: CLOSED MANIPULATION KNEE;  Surgeon: Lovell Sheehan, MD;  Location: [facility];  Service: Orthopedics;  Laterality: Right;  . RIGHT/LEFT HEART CATH AND CORONARY ANGIOGRAPHY N/A 04/21/2017   Procedure: RIGHT/LEFT HEART CATH AND CORONARY ANGIOGRAPHY;  Surgeon: Wellington Hampshire, MD;  Location: [place] [facility];  Service: Cardiovascular;  Laterality: N/A;  . TOTAL KNEE ARTHROPLASTY Right 07/02/2020   Procedure: TOTAL KNEE ARTHROPLASTY;  Surgeon: Lovell Sheehan, MD;  Location: [facility];  Service: Orthopedics;  Laterality: Right;   Family  History  Problem Relation Age of Onset  . Hypertension Mother   . Congestive Heart Failure Mother   . Congestive Heart Failure Father   . Prostate cancer Son    Social History   Tobacco Use  . Smoking status: Former Smoker    Years: 10.00    Types: Cigarettes    Start date: 09/14/1975    Quit date: 06/22/1986    Years since quitting: 34.4  . Smokeless tobacco: Never Used  Substance Use Topics  . Alcohol use: No    Alcohol/week: 0.0 standard drinks   No Active Allergies  Prior to Admission medications   Medication Sig  Start Date End Date Taking? Authorizing Provider  acetaminophen (TYLENOL) 500 MG tablet Take 1,000 mg by mouth every 6 (six) hours as needed for pain.    Yes [provider]  carvedilol (COREG) 6.25 MG tablet TAKE 1 TABLET BY MOUTH  TWICE DAILY WITH MEALS Patient taking differently: Take 6.25 mg by mouth 2 (two) times daily with a meal. 04/29/20  Yes Leib Elahi A, FNP  digoxin (LANOXIN) 0.125 MG tablet TAKE 1 TABLET BY MOUTH  DAILY Patient taking differently: Take 0.125 mg by mouth daily. 04/29/20  Yes Darylene Price A, FNP  docusate sodium (COLACE) 100 MG capsule Take 1 capsule (100 mg total) by mouth 2 (two) times daily. 07/04/20  Yes Carlynn Spry, PA-C  ELIQUIS 5 MG TABS tablet TAKE 1 TABLET BY MOUTH  TWICE DAILY Patient taking differently: Take 5 mg by mouth 2 (two) times daily. 05/20/20  Yes Loel Dubonnet, NP  furosemide (LASIX) 40 MG tablet TAKE 1 TABLET BY MOUTH  DAILY Patient taking differently: Take 40 mg by mouth daily. 09/08/20  Yes Shiquita Collignon, Otila Kluver A, FNP  levocetirizine (XYZAL) 5 MG tablet Take 1 tablet (5 mg total) by mouth every evening. 06/12/20  Yes Sowles, Drue Stager, MD  methocarbamol (ROBAXIN) 500 MG tablet Take 1 tablet (500 mg total) by mouth every 6 (six) hours as needed for muscle spasms. 07/04/20  Yes Carlynn Spry, PA-C  Multiple Vitamin (MULTIVITAMIN WITH MINERALS) TABS tablet Take 1 tablet by mouth daily.   Yes [provider]  potassium chloride SA (KLOR-CON) 20 MEQ tablet Take 1 tablet (20 mEq total) by mouth daily. Must make appointment for further refills Patient taking differently: Take 20 mEq by mouth daily. 03/07/20  Yes Silvina Hackleman, Otila Kluver A, FNP  rosuvastatin (CRESTOR) 40 MG tablet TAKE 1 TABLET BY MOUTH  DAILY 11/09/20  Yes Sowles, Drue Stager, MD  sacubitril-valsartan (ENTRESTO) 97-103 MG Take 1 tablet by mouth 2 (two) times daily. 03/07/20  Yes Saidee Geremia, Otila Kluver A, FNP  spironolactone (ALDACTONE) 25 MG tablet TAKE 1 TABLET BY MOUTH  DAILY Patient taking  differently: Take 25 mg by mouth daily. 04/29/20  Yes Alisa Graff, FNP    Review of Systems  Constitutional: Positive for fatigue (tire easily). Negative for appetite change.  HENT: Negative for congestion and postnasal drip.   Eyes: Negative.   Respiratory: Negative for chest tightness and shortness of breath.   Cardiovascular: Negative for chest pain, palpitations and leg swelling.  Gastrointestinal: Negative for abdominal distention and abdominal pain.  Endocrine: Negative.   Genitourinary: Negative.   Musculoskeletal: Positive for arthralgias (right knee pain improving). Negative for back pain.  Skin: Negative.   Allergic/Immunologic: Negative.   Neurological: Negative for dizziness and light-headedness.  Hematological: Negative for adenopathy. Does not bruise/bleed easily.  Psychiatric/Behavioral: Negative for dysphoric mood and sleep disturbance (sleeping on 1-2 pillows). The patient is  not nervous/anxious.    Vitals:   12/01/20 0957  BP: (!) 143/43  Pulse: 60  Resp: 18  SpO2: 100%  Weight: 250 lb 2 oz (113.5 kg)  Height: 5\' 7"  (1.702 m)   Wt Readings from Last 3 Encounters:  12/01/20 250 lb 2 oz (113.5 kg)  10/29/20 244 lb 14.9 oz (111.1 kg)  07/02/20 254 lb (115.2 kg)   Lab Results  Component Value Date   CREATININE 0.98 07/03/2020   CREATININE 1.08 (H) 07/02/2020   CREATININE 0.77 06/20/2020    Physical Exam Vitals and nursing note reviewed.  Constitutional:      Appearance: She is well-developed.  HENT:     Head: Normocephalic and atraumatic.  Neck:     Vascular: No JVD.  Cardiovascular:     Rate and Rhythm: Normal rate and regular rhythm.  Pulmonary:     Effort: Pulmonary effort is normal.     Breath sounds: No wheezing or rales.  Abdominal:     General: There is no distension.     Palpations: Abdomen is soft.     Tenderness: There is no abdominal tenderness.  Musculoskeletal:        General: No tenderness.     Cervical back: Normal range of  motion and neck supple.  Skin:    General: Skin is warm and dry.  Neurological:     Mental Status: She is alert and oriented to person, place, and time.  Psychiatric:        Behavior: Behavior normal.        Thought Content: Thought content normal.    Assessment & Plan:  1: Chronic heart failure with now preserved ejection fraction- - NYHA class III - Euvolemic today - weighing daily; reminded to call for an overnight weight gain of >2 pounds or a weekly weight gain of >5 pounds - weight down 9 pounds from last visit here 6 months ago - Not adding salt and has been reading food labels; using no sodium seasoning such as Mrs. Dash.  - Does wear support socks & elevates her legs; edema is intermittent - dig level on 01/09/20 was 0.7 - saw cardiology Fletcher Anon) 06/24/20  - BNP 04/19/17 was 1000.0 - reports receiving all 3 her COVID vaccines  2: HTN- - BP mildly elevated today (143/43) - saw PCP Ancil Boozer) 07/07/20 - BMP from 07/03/20 reviewed and showed sodium 138, potassium 4.2, creatinine 0.98 and GFR 59   Patient did not bring her medications nor a list. Each medication was verbally reviewed with the patient and she was encouraged to bring the bottles to every visit to confirm accuracy of list.  Due to HF stability, will not make a return appointment for patient at this time. Advised patient to continue close follow-up with cardiology and PCP but that she could call back at anytime to schedule another appointment and she was comfortable with this plan.

## 2020-12-01 ENCOUNTER — Ambulatory Visit: Payer: Medicare Other | Admitting: Family

## 2020-12-01 ENCOUNTER — Ambulatory Visit: Payer: Medicare Other | Attending: Family | Admitting: Family

## 2020-12-01 ENCOUNTER — Other Ambulatory Visit: Payer: Self-pay | Admitting: Family Medicine

## 2020-12-01 ENCOUNTER — Other Ambulatory Visit: Payer: Self-pay

## 2020-12-01 ENCOUNTER — Encounter: Payer: Self-pay | Admitting: Family

## 2020-12-01 VITALS — BP 143/43 | HR 60 | Resp 18 | Ht 67.0 in | Wt 250.1 lb

## 2020-12-01 DIAGNOSIS — J3089 Other allergic rhinitis: Secondary | ICD-10-CM

## 2020-12-01 DIAGNOSIS — Z79899 Other long term (current) drug therapy: Secondary | ICD-10-CM | POA: Diagnosis not present

## 2020-12-01 DIAGNOSIS — E785 Hyperlipidemia, unspecified: Secondary | ICD-10-CM | POA: Insufficient documentation

## 2020-12-01 DIAGNOSIS — I5032 Chronic diastolic (congestive) heart failure: Secondary | ICD-10-CM | POA: Diagnosis not present

## 2020-12-01 DIAGNOSIS — I110 Hypertensive heart disease with heart failure: Secondary | ICD-10-CM | POA: Diagnosis not present

## 2020-12-01 DIAGNOSIS — M170 Bilateral primary osteoarthritis of knee: Secondary | ICD-10-CM | POA: Insufficient documentation

## 2020-12-01 DIAGNOSIS — Z96651 Presence of right artificial knee joint: Secondary | ICD-10-CM | POA: Insufficient documentation

## 2020-12-01 DIAGNOSIS — J449 Chronic obstructive pulmonary disease, unspecified: Secondary | ICD-10-CM | POA: Diagnosis not present

## 2020-12-01 DIAGNOSIS — Z87891 Personal history of nicotine dependence: Secondary | ICD-10-CM | POA: Diagnosis not present

## 2020-12-01 DIAGNOSIS — I10 Essential (primary) hypertension: Secondary | ICD-10-CM

## 2020-12-01 DIAGNOSIS — I4430 Unspecified atrioventricular block: Secondary | ICD-10-CM | POA: Insufficient documentation

## 2020-12-01 DIAGNOSIS — I2510 Atherosclerotic heart disease of native coronary artery without angina pectoris: Secondary | ICD-10-CM | POA: Insufficient documentation

## 2020-12-01 DIAGNOSIS — Z7901 Long term (current) use of anticoagulants: Secondary | ICD-10-CM | POA: Diagnosis not present

## 2020-12-01 NOTE — Patient Instructions (Addendum)
Continue weighing daily and call for an overnight weight gain of > 2 pounds or a weekly weight gain of >5 pounds.   Call us in the future if you'd like to schedule another appointment 

## 2021-01-06 NOTE — Progress Notes (Signed)
Name: Kimberly Henry   MRN: [id number]    DOB: 1941/09/10   Date:01/07/2021       Progress Note  Subjective  Chief Complaint  Follow up and fatigue  HPI   CHF: diagnosed in 04/2017, last visit with CHF clinic was last month and saw Dr. Linton Rump 2022  She is on beta-blocker, digoxin, spironolactone, lasix and entrest.  Weight is stable gained a few pounds over the past year. She denies side effects of medication. She denies any current swelling, denies orthopnea, but has sob with moderate activity. Denies PND. She was diagnosed with NICM  Study Conclusions / ECHO 08/26/2017   - Left ventricle: The cavity size was mildly dilated. Systolic  function was moderately to severely reduced. The estimated  ejection fraction was in the range of 30% to 35%. Diffuse  hypokinesis. Hypokinesis of the anteroseptal myocardium. Doppler  parameters are consistent with abnormal left ventricular  relaxation (grade 1 diastolic dysfunction).  - Mitral valve: There was mild to moderate regurgitation.  - Left atrium: The atrium was moderately dilated.  - Right ventricle: Systolic function was normal.  - Pulmonary arteries: Systolic pressure was within the normal  range.   Elevated TSH: found on labs back in 04/2017, last checked back to normal, weight has been stable, but we will recheck it today   HTN: shehas been taking medication daily, no chest pain or palpitation, denies dizziness,  bp is towards low end of normal but not having any symptoms   SVT: she is doing well, no palpitation or fluttering sensation on her chest, doing well since started on CHF medication, beta blocker controlling symptoms and no side effects   OA: she has a long history of OA of both knees.She had total right  knee replacement done by Dr. Harlow Mares 06/2020 and had to go back for intra operative manipulation 10/2020, she states today her pain level is zero, but still has some stiffness, she had PT. She still using  a cane but mostly to help with pain on left lower leg, usually on left ankle. She will discuss it with Dr. Jairo Ben allergic rhinitis: she states symptoms controlled with medication denies rhinorrhea, nasal congestion or sneezing.    Chronic bronchitis: she smoked about 1 pack a day for about 20 years but quit many years ago, she states she has been doing well, no cough, wheezing has SOB with moderate activity only  Obesity: she has HTN, cardiomyopathy, CHF, OA , BMI 39, gained a little weight since last visit one year ago, reminded her of a healthy diet    Dyslipidemia: she is now on Atorvastatin and is due for labs, denies myalgia. She will have labs done today   CKI stage III: also some anemia, likely from blood loss, denies pruritus has good urine output, we will recheck labs today  Patient Active Problem List   Diagnosis Date Noted  . S/P total knee arthroplasty 07/03/2020  . S/P TKR (total knee replacement) using cement, right 07/02/2020  . COPD (chronic obstructive pulmonary disease) ([place])   . CAD (coronary artery disease)   . Atrial fibrillation ([place])   . Bradycardia   . Pre-bariatric surgery nutrition evaluation 06/24/2020  . Lung nodule seen on imaging study 01/09/2020  . NICM (nonischemic cardiomyopathy) ([place]) 01/09/2020  . Ventricular tachycardia ([place]) 07/18/2018  . Morbid obesity with BMI of 40.0-44.9, adult ([place]) 07/18/2018  . Chronic systolic heart failure ([place]) 04/28/2017  . Hypokalemia 04/19/2017  . Elevated troponin  04/19/2017  . Hyperglycemia 04/19/2017  . Aortic atherosclerosis ([place]) 03/10/2017  . Anemia, unspecified 03/08/2017  . Cardiomegaly 03/04/2017  . Perennial allergic rhinitis with seasonal variation 05/30/2015  . 1st degree AV block 03/25/2015  . Morbid (severe) obesity due to excess calories ([place]) 03/25/2015  . Metabolic syndrome 24/26/8341  . History of cervical cancer 03/03/2015  . Hypertension, benign 03/03/2015  . Osteoarthritis,  knee 03/03/2015  . Dyslipidemia 03/03/2015    Past Surgical History:  Procedure Laterality Date  . ABDOMINAL HYSTERECTOMY  1996  . CARDIAC CATHETERIZATION  2018   d/t CHF  . CATARACT EXTRACTION Left 2008  . JOINT REPLACEMENT Right 06/2020  . KNEE CLOSED REDUCTION Right 10/29/2020   Procedure: CLOSED MANIPULATION KNEE;  Surgeon: Lovell Sheehan, MD;  Location: [facility];  Service: Orthopedics;  Laterality: Right;  . RIGHT/LEFT HEART CATH AND CORONARY ANGIOGRAPHY N/A 04/21/2017   Procedure: RIGHT/LEFT HEART CATH AND CORONARY ANGIOGRAPHY;  Surgeon: Wellington Hampshire, MD;  Location: [facility];  Service: Cardiovascular;  Laterality: N/A;  . TOTAL KNEE ARTHROPLASTY Right 07/02/2020   Procedure: TOTAL KNEE ARTHROPLASTY;  Surgeon: Lovell Sheehan, MD;  Location: [facility];  Service: Orthopedics;  Laterality: Right;    Family History  Problem Relation Age of Onset  . Hypertension Mother   . Congestive Heart Failure Mother   . Congestive Heart Failure Father   . Prostate cancer Son     Social History   Tobacco Use  . Smoking status: Former Smoker    Years: 10.00    Types: Cigarettes    Start date: 09/14/1975    Quit date: 06/22/1986    Years since quitting: 34.5  . Smokeless tobacco: Never Used  Substance Use Topics  . Alcohol use: No    Alcohol/week: 0.0 standard drinks     Current Outpatient Medications:  .  acetaminophen (TYLENOL) 500 MG tablet, Take 1,000 mg by mouth every 6 (six) hours as needed for pain. , Disp: , Rfl:  .  carvedilol (COREG) 6.25 MG tablet, TAKE 1 TABLET BY MOUTH  TWICE DAILY WITH MEALS (Patient taking differently: Take 6.25 mg by mouth 2 (two) times daily with a meal.), Disp: 180 tablet, Rfl: 3 .  cephALEXin (KEFLEX) 500 MG capsule, cephalexin 500 mg capsule, Disp: , Rfl:  .  digoxin (LANOXIN) 0.125 MG tablet, TAKE 1 TABLET BY MOUTH  DAILY (Patient taking differently: Take 0.125 mg by mouth daily.), Disp: 90 tablet, Rfl: 3 .  docusate sodium (COLACE)  100 MG capsule, Take 1 capsule (100 mg total) by mouth 2 (two) times daily., Disp: 20 capsule, Rfl: 0 .  ELIQUIS 5 MG TABS tablet, TAKE 1 TABLET BY MOUTH  TWICE DAILY (Patient taking differently: Take 5 mg by mouth 2 (two) times daily.), Disp: 180 tablet, Rfl: 3 .  fluconazole (DIFLUCAN) 150 MG tablet, fluconazole 150 mg tablet, Disp: , Rfl:  .  furosemide (LASIX) 40 MG tablet, TAKE 1 TABLET BY MOUTH  DAILY (Patient taking differently: Take 40 mg by mouth daily.), Disp: 90 tablet, Rfl: 3 .  levocetirizine (XYZAL) 5 MG tablet, TAKE 1 TABLET BY MOUTH IN  THE EVENING, Disp: 90 tablet, Rfl: 1 .  methocarbamol (ROBAXIN) 500 MG tablet, Take 1 tablet (500 mg total) by mouth every 6 (six) hours as needed for muscle spasms., Disp: 40 tablet, Rfl: 1 .  methylPREDNISolone (MEDROL DOSEPAK) 4 MG TBPK tablet, See admin instructions. follow package directions, Disp: , Rfl:  .  Multiple Vitamin (MULTIVITAMIN WITH MINERALS) TABS  tablet, Take 1 tablet by mouth daily., Disp: , Rfl:  .  naloxone (NARCAN) nasal spray 4 mg/0.1 mL, Narcan 4 mg/actuation nasal spray  CALL 911. SPR CONTENTS OF ONE SPRAYER (0.1ML) INTO ONE NOSTRIL. REPEAT IN 2-3 MIN IF SYMPTOMS OF OPIOID EMERGENCY PERSIST, ALTERNATE NOSTRILS, Disp: , Rfl:  .  NARCAN 4 MG/0.1ML LIQD nasal spray kit, SMARTSIG:1 Spray(s) Both Nares, Disp: , Rfl:  .  oxyCODONE (OXY IR/ROXICODONE) 5 MG immediate release tablet, oxycodone 5 mg tablet  TAKE 1 TABLET BY MOUTH EVERY 4 HOURS AS NEEDED, Disp: , Rfl:  .  oxyCODONE (OXY IR/ROXICODONE) 5 MG immediate release tablet, Take 5 mg by mouth every 4 (four) hours as needed., Disp: , Rfl:  .  potassium chloride SA (KLOR-CON) 20 MEQ tablet, Take 1 tablet (20 mEq total) by mouth daily. Must make appointment for further refills (Patient taking differently: Take 20 mEq by mouth daily.), Disp: 90 tablet, Rfl: 3 .  rosuvastatin (CRESTOR) 40 MG tablet, TAKE 1 TABLET BY MOUTH  DAILY, Disp: 90 tablet, Rfl: 0 .  sacubitril-valsartan (ENTRESTO)  97-103 MG, Take 1 tablet by mouth 2 (two) times daily., Disp: 180 tablet, Rfl: 3 .  spironolactone (ALDACTONE) 25 MG tablet, TAKE 1 TABLET BY MOUTH  DAILY (Patient taking differently: Take 25 mg by mouth daily.), Disp: 90 tablet, Rfl: 3  No Active Allergies  I personally reviewed active problem list, medication list, allergies, family history, social history, health maintenance with the patient/caregiver today.   ROS  Constitutional: Negative for fever or weight change.  Respiratory: Negative for cough and intermittent  shortness of breath.   Cardiovascular: Negative for chest pain or palpitations.  Gastrointestinal: Negative for abdominal pain, no bowel changes.  Musculoskeletal: positive  for gait problem and has intermittent right knee and left ankle  joint swelling.  Skin: Negative for rash.  Neurological: Negative for dizziness or headache.  No other specific complaints in a complete review of systems (except as listed in HPI above).  Objective  Vitals:   01/07/21 0830  BP: 126/68  Pulse: 80  Resp: 16  Temp: 97.9 F (36.6 C)  TempSrc: Oral  SpO2: 99%  Weight: 253 lb (114.8 kg)  Height: '5\' 7"'  (1.702 m)    Body mass index is 39.63 kg/m.  Physical Exam  Constitutional: Patient appears well-developed and well-nourished. Obese  No distress.  HEENT: head atraumatic, normocephalic, pupils equal and reactive to light, neck supple Cardiovascular: Normal rate, regular rhythm and normal heart sounds.  No murmur heard. No BLE edema. Pulmonary/Chest: Effort normal and breath sounds normal. No respiratory distress. Abdominal: Soft.  There is no tenderness. Muscular skeletal: right knee not flexing completely, mild swelling of left ankle  Psychiatric: Patient has a normal mood and affect. behavior is normal. Judgment and thought content normal.  Recent Results (from the past 2160 hour(s))  SARS CORONAVIRUS 2 (TAT 6-24 HRS) Nasopharyngeal Nasopharyngeal Swab     Status: None    Collection Time: 10/27/20  9:37 AM   Specimen: Nasopharyngeal Swab  Result Value Ref Range   SARS Coronavirus 2 NEGATIVE NEGATIVE    Comment: (NOTE) SARS-CoV-2 target nucleic acids are NOT DETECTED.  The SARS-CoV-2 RNA is generally detectable in upper and lower respiratory specimens during the acute phase of infection. Negative results do not preclude SARS-CoV-2 infection, do not rule out co-infections with other pathogens, and should not be used as the sole basis for treatment or other patient management decisions. Negative results must be combined with clinical observations,  patient history, and epidemiological information. The expected result is Negative.  Fact Sheet for Patients: SugarRoll.be  Fact Sheet for Healthcare Providers: https://www.woods-mathews.com/  This test is not yet approved or cleared by the [place] FDA and  has been authorized for detection and/or diagnosis of SARS-CoV-2 by FDA under an Emergency Use Authorization (EUA). This EUA will remain  in effect (meaning this test can be used) for the duration of the COVID-19 declaration under Se ction 564(b)(1) of the Act, 21 U.S.C. section 360bbb-3(b)(1), unless the authorization is terminated or revoked sooner.  Performed at [facility], [place] [address] [place], [place] [address]       PHQ2/9: Depression screen [facility] 2/9 01/07/2021 06/18/2020 05/22/2020 04/16/2020 01/09/2020  Decreased Interest 0 0 0 0 0  Down, Depressed, Hopeless 0 0 0 0 0  PHQ - 2 Score 0 0 0 0 0  Altered sleeping - 0 - - 0  Tired, decreased energy - 0 - - 0  Change in appetite - 0 - - 0  Feeling bad or failure about yourself  - 0 - - 0  Trouble concentrating - 0 - - 0  Moving slowly or fidgety/restless - 0 - - 0  Suicidal thoughts - 0 - - 0  PHQ-9 Score - 0 - - 0  Difficult doing work/chores - Not difficult at all - - Not difficult at all    phq 9 is negative   Fall Risk: Fall Risk   01/07/2021 06/18/2020 05/22/2020 04/16/2020 04/11/2020  Falls in the past year? 0 0 0 0 0  Number falls in past yr: 0 0 0 - 0  Injury with Fall? 0 0 0 - 0  Risk for fall due to : - - No Fall Risks Other (Comment) -  Risk for fall due to: Comment - - - Hx of knee/joint pain -  Follow up - - Falls prevention discussed Falls prevention discussed Falls evaluation completed     Functional Status Survey: Is the patient deaf or have difficulty hearing?: No Does the patient have difficulty seeing, even when wearing glasses/contacts?: No Does the patient have difficulty concentrating, remembering, or making decisions?: No Does the patient have difficulty walking or climbing stairs?: No Does the patient have difficulty dressing or bathing?: No Does the patient have difficulty doing errands alone such as visiting a doctor's office or shopping?: No   Assessment & Plan  1. Chronic systolic heart failure (HCC)  - Digoxin level  2. Hypertension, benign  - CBC with Differential/Platelet - COMPLETE METABOLIC PANEL WITH GFR  3. Primary osteoarthritis of both knees   4. Morbid obesity ([place])  Discussed with the patient the risk posed by an increased BMI. Discussed importance of portion control, calorie counting and at least 150 minutes of physical activity weekly. Avoid sweet beverages and drink more water. Eat at least 6 servings of fruit and vegetables daily   5. Aortic atherosclerosis (HCC)  - rosuvastatin (CRESTOR) 40 MG tablet; Take 1 tablet (40 mg total) by mouth daily.  Dispense: 90 tablet; Refill: 3  6. Ventricular tachycardia ([place])   7. Dyslipidemia  - Lipid panel - rosuvastatin (CRESTOR) 40 MG tablet; Take 1 tablet (40 mg total) by mouth daily.  Dispense: 90 tablet; Refill: 3  8. NICM (nonischemic cardiomyopathy) ([place])   9. Perennial allergic rhinitis with seasonal variation   10. Simple chronic bronchitis ([place])   11. Anemia, unspecified type  - CBC with  Differential/Platelet - Iron, TIBC and Ferritin Panel  12. Hyperglycemia  - Hemoglobin A1c  13. Abnormal TSH  - TSH  14. Stage 3a chronic kidney disease (HCC)  - COMPLETE METABOLIC PANEL WITH GFR - VITAMIN D 25 Hydroxy (Vit-D Deficiency, Fractures)

## 2021-01-07 ENCOUNTER — Encounter: Payer: Self-pay | Admitting: Family Medicine

## 2021-01-07 ENCOUNTER — Other Ambulatory Visit: Payer: Self-pay

## 2021-01-07 ENCOUNTER — Ambulatory Visit (INDEPENDENT_AMBULATORY_CARE_PROVIDER_SITE_OTHER): Payer: Medicare Other | Admitting: Family Medicine

## 2021-01-07 VITALS — BP 126/68 | HR 80 | Temp 97.9°F | Resp 16 | Ht 67.0 in | Wt 253.0 lb

## 2021-01-07 DIAGNOSIS — M170 Bilateral primary osteoarthritis of knee: Secondary | ICD-10-CM | POA: Diagnosis not present

## 2021-01-07 DIAGNOSIS — I5022 Chronic systolic (congestive) heart failure: Secondary | ICD-10-CM | POA: Diagnosis not present

## 2021-01-07 DIAGNOSIS — R7989 Other specified abnormal findings of blood chemistry: Secondary | ICD-10-CM

## 2021-01-07 DIAGNOSIS — I472 Ventricular tachycardia, unspecified: Secondary | ICD-10-CM

## 2021-01-07 DIAGNOSIS — J410 Simple chronic bronchitis: Secondary | ICD-10-CM

## 2021-01-07 DIAGNOSIS — I700 Atherosclerosis of aorta: Secondary | ICD-10-CM | POA: Diagnosis not present

## 2021-01-07 DIAGNOSIS — J302 Other seasonal allergic rhinitis: Secondary | ICD-10-CM

## 2021-01-07 DIAGNOSIS — I10 Essential (primary) hypertension: Secondary | ICD-10-CM

## 2021-01-07 DIAGNOSIS — N1831 Chronic kidney disease, stage 3a: Secondary | ICD-10-CM | POA: Diagnosis not present

## 2021-01-07 DIAGNOSIS — R739 Hyperglycemia, unspecified: Secondary | ICD-10-CM | POA: Diagnosis not present

## 2021-01-07 DIAGNOSIS — I428 Other cardiomyopathies: Secondary | ICD-10-CM | POA: Diagnosis not present

## 2021-01-07 DIAGNOSIS — D649 Anemia, unspecified: Secondary | ICD-10-CM

## 2021-01-07 DIAGNOSIS — J3089 Other allergic rhinitis: Secondary | ICD-10-CM | POA: Diagnosis not present

## 2021-01-07 DIAGNOSIS — E785 Hyperlipidemia, unspecified: Secondary | ICD-10-CM | POA: Diagnosis not present

## 2021-01-07 MED ORDER — ROSUVASTATIN CALCIUM 40 MG PO TABS: 40.0000 mg | ORAL_TABLET | Freq: Every day | ORAL | 3 refills | Status: DC

## 2021-01-08 LAB — LIPID PANEL
Cholesterol: 141 mg/dL (ref <200)
HDL: 48 mg/dL — ABNORMAL LOW (ref >=50)
LDL Cholesterol (Calc): 75 mg/dL (calc)
Non-HDL Cholesterol (Calc): 93 mg/dL (calc) (ref <130)
Total CHOL/HDL Ratio: 2.9 (calc) (ref <5.0)
Triglycerides: 101 mg/dL (ref <150)

## 2021-01-08 LAB — IRON,TIBC AND FERRITIN PANEL
%SAT: 25 % (calc) (ref 16–45)
Ferritin: 180 ng/mL (ref 16–288)
Iron: 80 ug/dL (ref 45–160)
TIBC: 325 mcg/dL (calc) (ref 250–450)

## 2021-01-08 LAB — COMPLETE METABOLIC PANEL WITH GFR
AG Ratio: 1.5 (calc) (ref 1.0–2.5)
ALT: 12 U/L (ref 6–29)
AST: 16 U/L (ref 10–35)
Albumin: 4.2 g/dL (ref 3.6–5.1)
Alkaline phosphatase (APISO): 91 U/L (ref 37–153)
BUN: 16 mg/dL (ref 7–25)
CO2: 23 mmol/L (ref 20–32)
Calcium: 9.7 mg/dL (ref 8.6–10.4)
Chloride: 108 mmol/L (ref 98–110)
Creat: 0.88 mg/dL (ref 0.60–0.88)
GFR, Est African American: 72 mL/min/{1.73_m2} (ref >=60)
GFR, Est Non African American: 62 mL/min/{1.73_m2} (ref >=60)
Globulin: 2.8 g/dL (calc) (ref 1.9–3.7)
Glucose, Bld: 87 mg/dL (ref 65–99)
Potassium: 4.3 mmol/L (ref 3.5–5.3)
Sodium: 141 mmol/L (ref 135–146)
Total Bilirubin: 0.7 mg/dL (ref 0.2–1.2)
Total Protein: 7 g/dL (ref 6.1–8.1)

## 2021-01-08 LAB — CBC WITH DIFFERENTIAL/PLATELET
Absolute Monocytes: 182 cells/uL — ABNORMAL LOW (ref 200–950)
Basophils Absolute: 11 cells/uL (ref 0–200)
Basophils Relative: 0.3 %
Eosinophils Absolute: 61 cells/uL (ref 15–500)
Eosinophils Relative: 1.6 %
HCT: 37.3 % (ref 35.0–45.0)
Hemoglobin: 12.5 g/dL (ref 11.7–15.5)
Lymphs Abs: 1626 cells/uL (ref 850–3900)
MCH: 31.9 pg (ref 27.0–33.0)
MCHC: 33.5 g/dL (ref 32.0–36.0)
MCV: 95.2 fL (ref 80.0–100.0)
MPV: 11.5 fL (ref 7.5–12.5)
Monocytes Relative: 4.8 %
Neutro Abs: 1919 cells/uL (ref 1500–7800)
Neutrophils Relative %: 50.5 %
Platelets: 202 10*3/uL (ref 140–400)
RBC: 3.92 10*6/uL (ref 3.80–5.10)
RDW: 11.9 % (ref 11.0–15.0)
Total Lymphocyte: 42.8 %
WBC: 3.8 10*3/uL (ref 3.8–10.8)

## 2021-01-08 LAB — HEMOGLOBIN A1C
Hgb A1c MFr Bld: 5.4 % of total Hgb (ref <5.7)
Mean Plasma Glucose: 108 mg/dL
eAG (mmol/L): 6 mmol/L

## 2021-01-08 LAB — VITAMIN D 25 HYDROXY (VIT D DEFICIENCY, FRACTURES): Vit D, 25-Hydroxy: 22 ng/mL — ABNORMAL LOW (ref 30–100)

## 2021-01-08 LAB — TSH: TSH: 3.55 mIU/L (ref 0.40–4.50)

## 2021-01-08 LAB — DIGOXIN LEVEL: Digoxin Level: <0.5 mcg/L — ABNORMAL LOW (ref 0.8–2.0)

## 2021-01-10 ENCOUNTER — Other Ambulatory Visit: Payer: Self-pay | Admitting: Family

## 2021-01-13 DIAGNOSIS — M25572 Pain in left ankle and joints of left foot: Secondary | ICD-10-CM | POA: Diagnosis not present

## 2021-01-13 DIAGNOSIS — Z96651 Presence of right artificial knee joint: Secondary | ICD-10-CM | POA: Diagnosis not present

## 2021-01-26 DIAGNOSIS — H2513 Age-related nuclear cataract, bilateral: Secondary | ICD-10-CM | POA: Diagnosis not present

## 2021-01-26 DIAGNOSIS — H43813 Vitreous degeneration, bilateral: Secondary | ICD-10-CM | POA: Diagnosis not present

## 2021-02-23 DIAGNOSIS — H2511 Age-related nuclear cataract, right eye: Secondary | ICD-10-CM | POA: Diagnosis not present

## 2021-02-26 ENCOUNTER — Encounter: Payer: Self-pay | Admitting: Ophthalmology

## 2021-03-11 ENCOUNTER — Ambulatory Visit: Payer: Medicare Other | Admitting: Anesthesiology

## 2021-03-11 ENCOUNTER — Ambulatory Visit
Admission: RE | Admit: 2021-03-11 | Discharge: 2021-03-11 | Disposition: A | Discharge status: Home / Self Care | Payer: Medicare Other | Attending: Ophthalmology | Admitting: Ophthalmology

## 2021-03-11 ENCOUNTER — Encounter
Admission: RE | Disposition: A | Discharge status: Home / Self Care | Payer: Self-pay | Source: Home / Self Care | Attending: Ophthalmology

## 2021-03-11 ENCOUNTER — Other Ambulatory Visit: Payer: Self-pay

## 2021-03-11 ENCOUNTER — Encounter: Payer: Self-pay | Admitting: Ophthalmology

## 2021-03-11 DIAGNOSIS — Z79899 Other long term (current) drug therapy: Secondary | ICD-10-CM | POA: Insufficient documentation

## 2021-03-11 DIAGNOSIS — Z96651 Presence of right artificial knee joint: Secondary | ICD-10-CM | POA: Diagnosis not present

## 2021-03-11 DIAGNOSIS — Z87891 Personal history of nicotine dependence: Secondary | ICD-10-CM | POA: Diagnosis not present

## 2021-03-11 DIAGNOSIS — E669 Obesity, unspecified: Secondary | ICD-10-CM | POA: Insufficient documentation

## 2021-03-11 DIAGNOSIS — Z7901 Long term (current) use of anticoagulants: Secondary | ICD-10-CM | POA: Diagnosis not present

## 2021-03-11 DIAGNOSIS — Z6841 Body Mass Index (BMI) 40.0 and over, adult: Secondary | ICD-10-CM | POA: Diagnosis not present

## 2021-03-11 DIAGNOSIS — Z9842 Cataract extraction status, left eye: Secondary | ICD-10-CM | POA: Diagnosis not present

## 2021-03-11 DIAGNOSIS — H2511 Age-related nuclear cataract, right eye: Secondary | ICD-10-CM | POA: Diagnosis not present

## 2021-03-11 DIAGNOSIS — H25811 Combined forms of age-related cataract, right eye: Secondary | ICD-10-CM | POA: Diagnosis not present

## 2021-03-11 HISTORY — PX: CATARACT EXTRACTION W/PHACO: SHX586

## 2021-03-11 SURGERY — PHACOEMULSIFICATION, CATARACT, WITH IOL INSERTION
Anesthesia: Monitor Anesthesia Care | Site: Eye | Laterality: Right

## 2021-03-11 MED ORDER — TETRACAINE HCL 0.5 % OP SOLN
1.0000 [drp] | OPHTHALMIC | Status: DC | PRN
  Administered 2021-03-11 (×3): 1 [drp] via OPHTHALMIC

## 2021-03-11 MED ORDER — OXYCODONE HCL 5 MG/5ML PO SOLN: 5.0000 mg | Freq: Once | ORAL | Status: DC | PRN

## 2021-03-11 MED ORDER — BRIMONIDINE TARTRATE-TIMOLOL 0.2-0.5 % OP SOLN
OPHTHALMIC | Status: DC | PRN
  Administered 2021-03-11: 1 [drp] via OPHTHALMIC

## 2021-03-11 MED ORDER — LIDOCAINE HCL (PF) 2 % IJ SOLN
INTRAOCULAR | Status: DC | PRN
  Administered 2021-03-11: 1 mL via INTRAMUSCULAR

## 2021-03-11 MED ORDER — OXYCODONE HCL 5 MG PO TABS: 5.0000 mg | ORAL_TABLET | Freq: Once | ORAL | Status: DC | PRN

## 2021-03-11 MED ORDER — LACTATED RINGERS IV SOLN: INTRAVENOUS | Status: DC

## 2021-03-11 MED ORDER — CYCLOPENTOLATE HCL 2 % OP SOLN
1.0000 [drp] | OPHTHALMIC | Status: DC | PRN
  Administered 2021-03-11 (×3): 1 [drp] via OPHTHALMIC

## 2021-03-11 MED ORDER — PHENYLEPHRINE HCL 10 % OP SOLN
1.0000 [drp] | OPHTHALMIC | Status: DC | PRN
  Administered 2021-03-11 (×3): 1 [drp] via OPHTHALMIC

## 2021-03-11 MED ORDER — SIGHTPATH DOSE#1 NA HYALUR & NA CHOND-NA HYALUR IO KIT
PACK | INTRAOCULAR | Status: DC | PRN
  Administered 2021-03-11: 1 via OPHTHALMIC

## 2021-03-11 MED ORDER — FENTANYL CITRATE (PF) 100 MCG/2ML IJ SOLN
INTRAMUSCULAR | Status: DC | PRN
  Administered 2021-03-11: 50 ug via INTRAVENOUS

## 2021-03-11 MED ORDER — SIGHTPATH DOSE#1 BSS IO SOLN
INTRAOCULAR | Status: DC | PRN
  Administered 2021-03-11: 87 mL via OPHTHALMIC

## 2021-03-11 MED ORDER — CEFUROXIME OPHTHALMIC INJECTION 1 MG/0.1 ML
INJECTION | OPHTHALMIC | Status: DC | PRN
  Administered 2021-03-11: 0.1 mL via OPHTHALMIC

## 2021-03-11 MED ORDER — MIDAZOLAM HCL 2 MG/2ML IJ SOLN
INTRAMUSCULAR | Status: DC | PRN
  Administered 2021-03-11 (×2): .5 mg via INTRAVENOUS

## 2021-03-11 SURGICAL SUPPLY — 16 items
CANNULA ANT/CHMB 27GA (MISCELLANEOUS) ×2 IMPLANT
GLOVE SURG ENC TEXT LTX SZ7.5 (GLOVE) ×2 IMPLANT
GLOVE SURG TRIUMPH 8.0 PF LTX (GLOVE) ×2 IMPLANT
GOWN STRL REUS W/ TWL LRG LVL3 (GOWN DISPOSABLE) ×2 IMPLANT
GOWN STRL REUS W/TWL LRG LVL3 (GOWN DISPOSABLE) ×4
LENS IOL ACRYSOF IQ 22.5 (Intraocular Lens) ×2 IMPLANT
MARKER SKIN DUAL TIP RULER LAB (MISCELLANEOUS) ×2 IMPLANT
NEEDLE CAPSULORHEX 25GA (NEEDLE) ×2 IMPLANT
NEEDLE FILTER BLUNT 18X 1/2SAF (NEEDLE) ×2
NEEDLE FILTER BLUNT 18X1 1/2 (NEEDLE) ×2 IMPLANT
PACK EYE AFTER SURG (MISCELLANEOUS) ×2 IMPLANT
SYR 3ML LL SCALE MARK (SYRINGE) ×4 IMPLANT
SYR TB 1ML LUER SLIP (SYRINGE) ×2 IMPLANT
TIP ITREPID SGL USE BENT I/A (SUCTIONS) ×2 IMPLANT
WATER STERILE IRR 250ML POUR (IV SOLUTION) ×2 IMPLANT
WIPE NON LINTING 3.25X3.25 (MISCELLANEOUS) ×2 IMPLANT

## 2021-03-11 NOTE — Anesthesia Postprocedure Evaluation (Signed)
Anesthesia Post Note  Patient: [place]  Procedure(s) Performed: CATARACT EXTRACTION PHACO AND INTRAOCULAR LENS PLACEMENT (IOC) RIGHT (Right: Eye)     Patient location during evaluation: PACU Anesthesia Type: MAC Level of consciousness: awake and alert Pain management: pain level controlled Vital Signs Assessment: post-procedure vital signs reviewed and stable Respiratory status: spontaneous breathing, nonlabored ventilation, respiratory function stable and patient connected to nasal cannula oxygen Cardiovascular status: stable and blood pressure returned to baseline Postop Assessment: no apparent nausea or vomiting Anesthetic complications: no   No notable events documented.  Fidel Levy

## 2021-03-11 NOTE — Transfer of Care (Signed)
Immediate Anesthesia Transfer of Care Note  Patient: [place]  Procedure(s) Performed: CATARACT EXTRACTION PHACO AND INTRAOCULAR LENS PLACEMENT (IOC) RIGHT (Right: Eye)  Patient Location: PACU  Anesthesia Type: MAC  Level of Consciousness: awake, alert  and patient cooperative  Airway and Oxygen Therapy: Patient Spontanous Breathing   Post-op Assessment: Post-op Vital signs reviewed, Patient's Cardiovascular Status Stable, Respiratory Function Stable, Patent Airway and No signs of Nausea or vomiting  Post-op Vital Signs: Reviewed and stable  Complications: No notable events documented.

## 2021-03-11 NOTE — Anesthesia Procedure Notes (Signed)
Procedure Name: MAC Date/Time: 03/11/2021 8:05 AM Performed by: Vanetta Shawl, CRNA Pre-anesthesia Checklist: Patient identified, Emergency Drugs available, Suction available, Timeout performed and Patient being monitored Patient Re-evaluated:Patient Re-evaluated prior to induction Oxygen Delivery Method: Nasal cannula Placement Confirmation: positive ETCO2

## 2021-03-11 NOTE — Anesthesia Preprocedure Evaluation (Signed)
Anesthesia Evaluation  Patient identified by MRN, date of birth, ID band Patient awake    Reviewed: NPO status   History of Anesthesia Complications Negative for: history of anesthetic complications  Airway Mallampati: II  TM Distance: >3 FB Neck ROM: full    Dental  (+) Partial Upper, Partial Lower   Pulmonary COPD (mild), former smoker,    Pulmonary exam normal        Cardiovascular Exercise Tolerance: Good hypertension, + CAD and +CHF (in 2018; hx (nonischemic cardiomyopathy))  Normal cardiovascular exam+ dysrhythmias Atrial Fibrillation and Supra Ventricular Tachycardia   echo: 01/2020:  1. Left ventricular ejection fraction, by estimation, is 50 to 55%. Left  ventricular ejection fraction by PLAX is 50 %. The left ventricle has low  normal function. The left ventricle has no regional wall motion  abnormalities. Left ventricular diastolic  parameters were normal.  2. Right ventricular systolic function is normal. The right ventricular  size is normal. There is mildly elevated pulmonary artery systolic  pressure.  3. Left atrial size was mildly dilated.  4. The mitral valve is grossly normal. Trivial mitral valve  regurgitation.  5. The aortic valve is grossly normal. Aortic valve regurgitation is not  visualized. No aortic stenosis is present.    ekg: 06/2020: afib @ 58;   Hx LBBB   Neuro/Psych negative neurological ROS  negative psych ROS   GI/Hepatic negative GI ROS, Neg liver ROS,   Endo/Other  Morbid obesity (bmi 40)  Renal/GU CRFRenal disease (ckd3)  negative genitourinary   Musculoskeletal  (+) Arthritis ,   Abdominal   Peds  Hematology negative hematology ROS (+)   Anesthesia Other Findings pcp: Steele Sizer, MD at 01/07/2021 ;  last eliquis: 6/29;   Reproductive/Obstetrics                             Anesthesia Physical Anesthesia Plan  ASA:  3  Anesthesia Plan: MAC   Post-op Pain Management:    Induction:   PONV Risk Score and Plan: 2 and Midazolam and TIVA  Airway Management Planned:   Additional Equipment:   Intra-op Plan:   Post-operative Plan:   Informed Consent: I have reviewed the patients History and Physical, chart, labs and discussed the procedure including the risks, benefits and alternatives for the proposed anesthesia with the patient or authorized representative who has indicated his/her understanding and acceptance.       Plan Discussed with: CRNA  Anesthesia Plan Comments:         Anesthesia Quick Evaluation

## 2021-03-11 NOTE — H&P (Signed)
[facility]   Primary Care Physician:  Steele Sizer, MD Ophthalmologist: Dr. Leandrew Koyanagi  Pre-Procedure History & Physical: HPI:  Kimberly Henry is a 80 y.o. female here for ophthalmic surgery.   Past Medical History:  Diagnosis Date   Acid phosphatase elevated    Aortic atherosclerosis (HCC)    Atrial fibrillation (HCC)    AV block, 1st degree    COPD (chronic obstructive pulmonary disease) (HCC)    HFrEF (heart failure with reduced ejection fraction) ([place])    a. 04/2017 Echo: EF 30-35%, antsept HK, mild MR, mod dil LA, mildly dil RA, mod TR, mildly to mod elev PASP; b. 08/2017 Echo: EF 30-35%, diff HK, antsept HK. Gr1 DD. Mild to mod MR. PASP nl.   Hx of cervical malignancy    Hyperlipidemia    Hypertension    LBBB (left bundle branch block)    Metabolic syndrome    NICM (nonischemic cardiomyopathy) ([place])    a. 04/2017 Echo: EF 30-35%; b. 04/2017 Cath: nonobs dzs; c. 08/2017 Echo: EF 30-35%, diff HK, antsept HK. Gr1 DD. Mild to mod MR. PASP nl.   Non-obstructive CAD (coronary artery disease)    a. 04/2017 Cath: mild nonobs CAD. EF 25-35%. CO 3.14, CI 1.38. Sev PAH [61/34(47].   Obesity, Class III, BMI 40-49.9 (morbid obesity) (HCC)    Osteoarthritis of both knees    PAH (pulmonary arterial hypertension) with portal hypertension ([place])    a. 04/2017 Right Heart Cath: Sev PAH 61/34(47); b. 08/2017 Echo: nl PASP.    Past Surgical History:  Procedure Laterality Date   ABDOMINAL HYSTERECTOMY  1996   CARDIAC CATHETERIZATION  2018   d/t CHF   CATARACT EXTRACTION Left 2008   JOINT REPLACEMENT Right 06/2020   KNEE CLOSED REDUCTION Right 10/29/2020   Procedure: CLOSED MANIPULATION KNEE;  Surgeon: Lovell Sheehan, MD;  Location: [facility]S;  Service: Orthopedics;  Laterality: Right;   RIGHT/LEFT HEART CATH AND CORONARY ANGIOGRAPHY N/A 04/21/2017   Procedure: RIGHT/LEFT HEART CATH AND CORONARY ANGIOGRAPHY;  Surgeon: Wellington Hampshire, MD;  Location: [facility];   Service: Cardiovascular;  Laterality: N/A;   TOTAL KNEE ARTHROPLASTY Right 07/02/2020   Procedure: TOTAL KNEE ARTHROPLASTY;  Surgeon: Lovell Sheehan, MD;  Location: [facility];  Service: Orthopedics;  Laterality: Right;    Prior to Admission medications   Medication Sig Start Date End Date Taking? Authorizing Provider  acetaminophen (TYLENOL) 500 MG tablet Take 1,000 mg by mouth every 6 (six) hours as needed for pain.    Yes [provider]  carvedilol (COREG) 6.25 MG tablet TAKE 1 TABLET BY MOUTH  TWICE DAILY WITH MEALS Patient taking differently: Take 6.25 mg by mouth 2 (two) times daily with a meal. 04/29/20  Yes Hackney, Tina A, FNP  digoxin (LANOXIN) 0.125 MG tablet TAKE 1 TABLET BY MOUTH  DAILY Patient taking differently: Take 0.125 mg by mouth daily. 04/29/20  Yes Hackney, Tina A, FNP  ELIQUIS 5 MG TABS tablet TAKE 1 TABLET BY MOUTH  TWICE DAILY Patient taking differently: Take 5 mg by mouth 2 (two) times daily. 05/20/20  Yes Loel Dubonnet, NP  ENTRESTO 97-103 MG TAKE 1 TABLET BY MOUTH  TWICE DAILY 01/10/21  Yes Darylene Price A, FNP  furosemide (LASIX) 40 MG tablet TAKE 1 TABLET BY MOUTH  DAILY Patient taking differently: Take 40 mg by mouth daily. 09/08/20  Yes Hackney, Otila Kluver A, FNP  levocetirizine (XYZAL) 5 MG tablet TAKE 1 TABLET BY MOUTH IN  THE EVENING 12/01/20  Yes Sowles, Drue Stager, MD  methocarbamol (ROBAXIN) 500 MG tablet Take 1 tablet (500 mg total) by mouth every 6 (six) hours as needed for muscle spasms. 07/04/20  Yes Carlynn Spry, PA-C  Multiple Vitamin (MULTIVITAMIN WITH MINERALS) TABS tablet Take 1 tablet by mouth daily.   Yes [provider]  potassium chloride SA (KLOR-CON) 20 MEQ tablet TAKE 1 TABLET BY MOUTH  DAILY 01/10/21  Yes Darylene Price A, FNP  rosuvastatin (CRESTOR) 40 MG tablet Take 1 tablet (40 mg total) by mouth daily. 01/07/21  Yes Sowles, Drue Stager, MD  spironolactone (ALDACTONE) 25 MG tablet TAKE 1 TABLET BY MOUTH  DAILY Patient taking  differently: Take 25 mg by mouth daily. 04/29/20  Yes Hackney, Otila Kluver A, FNP  naloxone [facility]) nasal spray 4 mg/0.1 mL Narcan 4 mg/actuation nasal spray  CALL 911. SPR CONTENTS OF ONE SPRAYER (0.1ML) INTO ONE NOSTRIL. REPEAT IN 2-3 MIN IF SYMPTOMS OF OPIOID EMERGENCY PERSIST, ALTERNATE NOSTRILS    [provider]    Allergies as of 02/04/2021   (No Active Allergies)    Family History  Problem Relation Age of Onset   Hypertension Mother    Congestive Heart Failure Mother    Congestive Heart Failure Father    Prostate cancer Son     Social History   Socioeconomic History   Marital status: Widowed    Spouse name: Not on file   Number of children: 4   Years of education: 23   Highest education level: 12th grade  Occupational History   Occupation: helps disabled persons    Comment: part time  Tobacco Use   Smoking status: Former    Years: 10.00    Pack years: 0.00    Types: Cigarettes    Start date: 09/14/1975    Quit date: 06/22/1986    Years since quitting: 34.7   Smokeless tobacco: Never  Vaping Use   Vaping Use: Never used  Substance and Sexual Activity   Alcohol use: No    Alcohol/week: 0.0 standard drinks   Drug use: No   Sexual activity: Not Currently  Other Topics Concern   Not on file  Social History Narrative   Patient has 4 grown children-1 of which is deceased, 39 grandchildren & 5 great-grandchildren      Patient lives with her grandson.    Social Determinants of Health   Financial Resource Strain: Low Risk    Difficulty of Paying Living Expenses: Not hard at all  Food Insecurity: No Food Insecurity   Worried About Charity fundraiser in the Last Year: Never true   [place] in the Last Year: Never true  Transportation Needs: No Transportation Needs   Lack of Transportation (Medical): No   Lack of Transportation (Non-Medical): No  Physical Activity: Inactive   Days of Exercise per Week: 0 days   Minutes of Exercise per Session: 0 min   Stress: No Stress Concern Present   Feeling of Stress : Not at all  Social Connections: Moderately Isolated   Frequency of Communication with Friends and Family: More than three times a week   Frequency of Social Gatherings with Friends and Family: Three times a week   Attends Religious Services: More than 4 times per year   Active Member of Clubs or Organizations: No   Attends Archivist Meetings: Never   Marital Status: Widowed  Intimate Partner Violence: Not At Risk   Fear of Current or Ex-Partner: No  Emotionally Abused: No   Physically Abused: No   Sexually Abused: No    Review of Systems: See HPI, otherwise negative ROS  Physical Exam: BP (!) 161/88   Pulse 82   Temp (!) 97 F (36.1 C) (Temporal)   Resp 18   Ht 5\' 7"  (1.702 m)   Wt 116.6 kg   SpO2 99%   BMI 40.25 kg/m  General:   Alert,  pleasant and cooperative in NAD Head:  Normocephalic and atraumatic. Lungs:  Clear to auscultation.    Heart:  Regular rate and rhythm.   Impression/Plan: Kimberly Henry is here for ophthalmic surgery.  Risks, benefits, limitations, and alternatives regarding ophthalmic surgery have been reviewed with the patient.  Questions have been answered.  All parties agreeable.   Leandrew Koyanagi, MD  03/11/2021, 7:32 AM

## 2021-03-11 NOTE — Op Note (Signed)
  LOCATION:  [place]   PREOPERATIVE DIAGNOSIS:    Nuclear sclerotic cataract right eye. H25.11   POSTOPERATIVE DIAGNOSIS:  Nuclear sclerotic cataract right eye.     PROCEDURE:  Phacoemusification with posterior chamber intraocular lens placement of the right eye   ULTRASOUND TIME: Procedure(s) with comments: CATARACT EXTRACTION PHACO AND INTRAOCULAR LENS PLACEMENT (IOC) RIGHT (Right) - 7.70 01:31.1  LENS:   Implant Name Type Inv. Item Serial No. Manufacturer Lot No. LRB No. Used Action  LENS IOL ACRYSOF IQ 22.5 - [id number] Intraocular Lens LENS IOL ACRYSOF IQ 22.5 [id number] ALCON  Right 1 Implanted         SURGEON:  Wyonia Hough, MD   ANESTHESIA:  Topical with tetracaine drops and 2% Xylocaine jelly, augmented with 1% preservative-free intracameral lidocaine.    COMPLICATIONS:  None.   DESCRIPTION OF PROCEDURE:  The patient was identified in the holding room and transported to the operating room and placed in the supine position under the operating microscope.  The right eye was identified as the operative eye and it was prepped and draped in the usual sterile ophthalmic fashion.   A 1 millimeter clear-corneal paracentesis was made at the 12:00 position.  0.5 ml of preservative-free 1% lidocaine was injected into the anterior chamber. The anterior chamber was filled with Viscoat viscoelastic.  A 2.4 millimeter keratome was used to make a near-clear corneal incision at the 9:00 position.  A curvilinear capsulorrhexis was made with a cystotome and capsulorrhexis forceps.  Balanced salt solution was used to hydrodissect and hydrodelineate the nucleus.   Phacoemulsification was then used in stop and chop fashion to remove the lens nucleus and epinucleus.  The remaining cortex was then removed using the irrigation and aspiration handpiece. Provisc was then placed into the capsular bag to distend it for lens placement.  A lens was then injected into the capsular  bag.  The remaining viscoelastic was aspirated.   Wounds were hydrated with balanced salt solution.  The anterior chamber was inflated to a physiologic pressure with balanced salt solution.  No wound leaks were noted. Cefuroxime 0.1 ml of a 10mg /ml solution was injected into the anterior chamber for a dose of 1 mg of intracameral antibiotic at the completion of the case.   Timolol and Brimonidine drops were applied to the eye.  The patient was taken to the recovery room in stable condition without complications of anesthesia or surgery.   Kimberly Henry 03/11/2021, 8:27 AM

## 2021-03-23 ENCOUNTER — Encounter: Payer: Self-pay | Admitting: Ophthalmology

## 2021-03-30 ENCOUNTER — Other Ambulatory Visit: Payer: Self-pay | Admitting: Family

## 2021-03-30 DIAGNOSIS — I4891 Unspecified atrial fibrillation: Secondary | ICD-10-CM

## 2021-03-30 NOTE — Telephone Encounter (Addendum)
Prescription refill request for Eliquis received. Indication: afib Last office visit: 06/24/2020, Arida Scr: 0.88, 01/07/2021 Age: 80 yo  Weight: 116.6 kg   Pt is on the correct dose of Eliquis per dosing criteria, prescription refill sent for Eliquis 5mg  BID.

## 2021-04-21 DIAGNOSIS — Z961 Presence of intraocular lens: Secondary | ICD-10-CM | POA: Diagnosis not present

## 2021-05-04 ENCOUNTER — Other Ambulatory Visit: Payer: Self-pay

## 2021-05-04 DIAGNOSIS — I700 Atherosclerosis of aorta: Secondary | ICD-10-CM

## 2021-05-04 DIAGNOSIS — I4891 Unspecified atrial fibrillation: Secondary | ICD-10-CM

## 2021-05-04 DIAGNOSIS — E785 Hyperlipidemia, unspecified: Secondary | ICD-10-CM

## 2021-05-04 NOTE — Telephone Encounter (Signed)
*  STAT* If patient is at the pharmacy, call can be transferred to refill team.   1. Which medications need to be refilled? (please list name of each medication and dose if known) Spironolactone, Crestor, Entresto, Digoxin, Carvedilol, Eliquis  2. Which pharmacy/location (including street and city if local pharmacy) is medication to be sent to? [facility]  3. Do they need a 30 day or 90 day supply? [place]

## 2021-05-05 MED ORDER — ROSUVASTATIN CALCIUM 40 MG PO TABS: 40.0000 mg | ORAL_TABLET | Freq: Every day | ORAL | 0 refills | Status: DC

## 2021-05-05 MED ORDER — ENTRESTO 97-103 MG PO TABS
1.0000 | ORAL_TABLET | Freq: Two times a day (BID) | ORAL | 0 refills | Status: DC
Start: 2021-05-05 — End: 2021-09-02

## 2021-05-05 MED ORDER — DIGOXIN 125 MCG PO TABS: 0.1250 mg | ORAL_TABLET | Freq: Every day | ORAL | 0 refills | Status: DC

## 2021-05-05 MED ORDER — SPIRONOLACTONE 25 MG PO TABS: 25.0000 mg | ORAL_TABLET | Freq: Every day | ORAL | 0 refills | Status: DC

## 2021-05-05 MED ORDER — APIXABAN 5 MG PO TABS: 5.0000 mg | ORAL_TABLET | Freq: Two times a day (BID) | ORAL | 1 refills | Status: DC

## 2021-05-05 MED ORDER — CARVEDILOL 6.25 MG PO TABS: 6.2500 mg | ORAL_TABLET | Freq: Two times a day (BID) | ORAL | 0 refills | Status: DC

## 2021-05-05 NOTE — Telephone Encounter (Signed)
Refill Request.  

## 2021-05-05 NOTE — Telephone Encounter (Signed)
Pt overdue for 4 month f/u with Dr.Arida. Pt has future appointment 05/2021. Pt requesting 90 day refills some of these meds are prescribed by Lysle Morales and Dr. Ancil Boozer. Please advise if ok to refill medications.

## 2021-05-05 NOTE — Telephone Encounter (Signed)
Prescription refill request for Eliquis received. Indication: atrial fib Last office visit: 06/24/20  Rod Can MD Scr: 0.88 on 01/07/21 Age: 80 Weight: 115.2kg  Based on above findings Eliquis '5mg'$  twice daily is the appropriate dose.  Refill approved.  Pt past due appt with Dr Fletcher Anon but has one scheduled for 06/04/21/

## 2021-05-26 ENCOUNTER — Ambulatory Visit: Payer: Medicare Other

## 2021-05-28 ENCOUNTER — Ambulatory Visit (INDEPENDENT_AMBULATORY_CARE_PROVIDER_SITE_OTHER): Payer: Medicare Other

## 2021-05-28 DIAGNOSIS — Z0000 Encounter for general adult medical examination without abnormal findings: Secondary | ICD-10-CM

## 2021-05-28 NOTE — Patient Instructions (Signed)
Kimberly Henry , Thank you for taking time to come for your Medicare Wellness Visit. I appreciate your ongoing commitment to your health goals. Please review the following plan we discussed and let me know if I can assist you in the future.   Screening recommendations/referrals: Colonoscopy: no longer required Mammogram: no longer required Bone Density: no longer required Recommended yearly ophthalmology/optometry visit for glaucoma screening and checkup Recommended yearly dental visit for hygiene and checkup  Vaccinations: Influenza vaccine: due Pneumococcal vaccine: done 05/30/15 Tdap vaccine: done 05/02/12 Shingles vaccine: Shingrix discussed. Please contact your pharmacy for coverage information.  Covid-19:done 09/13/19, 10/12/19, 08/15/20 7 12/15/20  Advanced directives: Advance directive discussed with you today. I have provided a copy for you to complete at home and have notarized. Once this is complete please bring a copy in to our office so we can scan it into your chart.   Conditions/risks identified: Recommend increasing activity as tolerated  Next appointment: Follow up in one year for your annual wellness visit    Preventive Care 65 Years and Older, Female Preventive care refers to lifestyle choices and visits with your health care provider that can promote health and wellness. What does preventive care include? A yearly physical exam. This is also called an annual well check. Dental exams once or twice a year. Routine eye exams. Ask your health care provider how often you should have your eyes checked. Personal lifestyle choices, including: Daily care of your teeth and gums. Regular physical activity. Eating a healthy diet. Avoiding tobacco and drug use. Limiting alcohol use. Practicing safe sex. Taking low-dose aspirin every day. Taking vitamin and mineral supplements as recommended by your health care provider. What happens during an annual well check? The services and  screenings done by your health care provider during your annual well check will depend on your age, overall health, lifestyle risk factors, and family history of disease. Counseling  Your health care provider may ask you questions about your: Alcohol use. Tobacco use. Drug use. Emotional well-being. Home and relationship well-being. Sexual activity. Eating habits. History of falls. Memory and ability to understand (cognition). Work and work Statistician. Reproductive health. Screening  You may have the following tests or measurements: Height, weight, and BMI. Blood pressure. Lipid and cholesterol levels. These may be checked every 5 years, or more frequently if you are over 65 years old. Skin check. Lung cancer screening. You may have this screening every year starting at age 48 if you have a 30-pack-year history of smoking and currently smoke or have quit within the past 15 years. Fecal occult blood test (FOBT) of the stool. You may have this test every year starting at age 4. Flexible sigmoidoscopy or colonoscopy. You may have a sigmoidoscopy every 5 years or a colonoscopy every 10 years starting at age 63. Hepatitis C blood test. Hepatitis B blood test. Sexually transmitted disease (STD) testing. Diabetes screening. This is done by checking your blood sugar (glucose) after you have not eaten for a while (fasting). You may have this done every 1-3 years. Bone density scan. This is done to screen for osteoporosis. You may have this done starting at age 36. Mammogram. This may be done every 1-2 years. Talk to your health care provider about how often you should have regular mammograms. Talk with your health care provider about your test results, treatment options, and if necessary, the need for more tests. Vaccines  Your health care provider may recommend certain vaccines, such as: Influenza vaccine. This is  recommended every year. Tetanus, diphtheria, and acellular pertussis (Tdap,  Td) vaccine. You may need a Td booster every 10 years. Zoster vaccine. You may need this after age 31. Pneumococcal 13-valent conjugate (PCV13) vaccine. One dose is recommended after age 31. Pneumococcal polysaccharide (PPSV23) vaccine. One dose is recommended after age 89. Talk to your health care provider about which screenings and vaccines you need and how often you need them. This information is not intended to replace advice given to you by your health care provider. Make sure you discuss any questions you have with your health care provider. Document Released: 09/26/2015 Document Revised: 05/19/2016 Document Reviewed: 07/01/2015 Elsevier Interactive Patient Education  2017 [place] Prevention in the Home Falls can cause injuries. They can happen to people of all ages. There are many things you can do to make your home safe and to help prevent falls. What can I do on the outside of my home? Regularly fix the edges of walkways and driveways and fix any cracks. Remove anything that might make you trip as you walk through a door, such as a raised step or threshold. Trim any bushes or trees on the path to your home. Use bright outdoor lighting. Clear any walking paths of anything that might make someone trip, such as rocks or tools. Regularly check to see if handrails are loose or broken. Make sure that both sides of any steps have handrails. Any raised decks and porches should have guardrails on the edges. Have any leaves, snow, or ice cleared regularly. Use sand or salt on walking paths during winter. Clean up any spills in your garage right away. This includes oil or grease spills. What can I do in the bathroom? Use night lights. Install grab bars by the toilet and in the tub and shower. Do not use towel bars as grab bars. Use non-skid mats or decals in the tub or shower. If you need to sit down in the shower, use a plastic, non-slip stool. Keep the floor dry. Clean up any  water that spills on the floor as soon as it happens. Remove soap buildup in the tub or shower regularly. Attach bath mats securely with double-sided non-slip rug tape. Do not have throw rugs and other things on the floor that can make you trip. What can I do in the bedroom? Use night lights. Make sure that you have a light by your bed that is easy to reach. Do not use any sheets or blankets that are too big for your bed. They should not hang down onto the floor. Have a firm chair that has side arms. You can use this for support while you get dressed. Do not have throw rugs and other things on the floor that can make you trip. What can I do in the kitchen? Clean up any spills right away. Avoid walking on wet floors. Keep items that you use a lot in easy-to-reach places. If you need to reach something above you, use a strong step stool that has a grab bar. Keep electrical cords out of the way. Do not use floor polish or wax that makes floors slippery. If you must use wax, use non-skid floor wax. Do not have throw rugs and other things on the floor that can make you trip. What can I do with my stairs? Do not leave any items on the stairs. Make sure that there are handrails on both sides of the stairs and use them. Fix handrails that are  broken or loose. Make sure that handrails are as long as the stairways. Check any carpeting to make sure that it is firmly attached to the stairs. Fix any carpet that is loose or worn. Avoid having throw rugs at the top or bottom of the stairs. If you do have throw rugs, attach them to the floor with carpet tape. Make sure that you have a light switch at the top of the stairs and the bottom of the stairs. If you do not have them, ask someone to add them for you. What else can I do to help prevent falls? Wear shoes that: Do not have high heels. Have rubber bottoms. Are comfortable and fit you well. Are closed at the toe. Do not wear sandals. If you use a  stepladder: Make sure that it is fully opened. Do not climb a closed stepladder. Make sure that both sides of the stepladder are locked into place. Ask someone to hold it for you, if possible. Clearly mark and make sure that you can see: Any grab bars or handrails. First and last steps. Where the edge of each step is. Use tools that help you move around (mobility aids) if they are needed. These include: Canes. Walkers. Scooters. Crutches. Turn on the lights when you go into a dark area. Replace any light bulbs as soon as they burn out. Set up your furniture so you have a clear path. Avoid moving your furniture around. If any of your floors are uneven, fix them. If there are any pets around you, be aware of where they are. Review your medicines with your doctor. Some medicines can make you feel dizzy. This can increase your chance of falling. Ask your doctor what other things that you can do to help prevent falls. This information is not intended to replace advice given to you by your health care provider. Make sure you discuss any questions you have with your health care provider. Document Released: 06/26/2009 Document Revised: 02/05/2016 Document Reviewed: 10/04/2014 Elsevier Interactive Patient Education  2017 [employer].

## 2021-05-28 NOTE — Progress Notes (Signed)
Subjective:   Kimberly Henry is a 80 y.o. female who presents for Medicare Annual (Subsequent) preventive examination.  Virtual Visit via Telephone Note  I connected with  Kimberly Henry on 05/28/21 at  9:20 AM EDT by telephone and verified that I am speaking with the correct person using two identifiers.  Location: Patient: home Provider: [place] Persons participating in the virtual visit: [place]   I discussed the limitations, risks, security and privacy concerns of performing an evaluation and management service by telephone and the availability of in person appointments. The patient expressed understanding and agreed to proceed.  Interactive audio and video telecommunications were attempted between this nurse and patient, however failed, due to patient having technical difficulties OR patient did not have access to video capability.  We continued and completed visit with audio only.  Some vital signs may be absent or patient reported.   Clemetine Marker, LPN   Review of Systems     Cardiac Risk Factors include: advanced age (>17mn, >>73women);dyslipidemia;hypertension;obesity (BMI >30kg/m2)     Objective:    There were no vitals filed for this visit. There is no height or weight on file to calculate BMI.  Advanced Directives 05/28/2021 10/29/2020 10/23/2020 07/02/2020 06/20/2020 11/07/2018 07/11/2017  Does Patient Have a Medical Advance Directive? No No No No No No No  Would patient like information on creating a medical advance directive? Yes (MAU/Ambulatory/Procedural Areas - Information given) No - Patient declined No - Patient declined No - Patient declined No - Patient declined Yes (MAU/Ambulatory/Procedural Areas - Information given) -    Current Medications (verified) Outpatient Encounter Medications as of 05/28/2021  Medication Sig   acetaminophen (TYLENOL) 500 MG tablet Take 1,000 mg by mouth every 6 (six) hours as needed for pain.    apixaban  (ELIQUIS) 5 MG TABS tablet Take 1 tablet (5 mg total) by mouth 2 (two) times daily.   carvedilol (COREG) 6.25 MG tablet Take 1 tablet (6.25 mg total) by mouth 2 (two) times daily with a meal.   digoxin (LANOXIN) 0.125 MG tablet Take 1 tablet (0.125 mg total) by mouth daily.   furosemide (LASIX) 40 MG tablet TAKE 1 TABLET BY MOUTH  DAILY (Patient taking differently: Take 40 mg by mouth daily.)   levocetirizine (XYZAL) 5 MG tablet TAKE 1 TABLET BY MOUTH IN  THE EVENING   methocarbamol (ROBAXIN) 500 MG tablet Take 1 tablet (500 mg total) by mouth every 6 (six) hours as needed for muscle spasms.   Multiple Vitamin (MULTIVITAMIN WITH MINERALS) TABS tablet Take 1 tablet by mouth daily.   potassium chloride SA (KLOR-CON) 20 MEQ tablet TAKE 1 TABLET BY MOUTH  DAILY   rosuvastatin (CRESTOR) 40 MG tablet Take 1 tablet (40 mg total) by mouth daily.   sacubitril-valsartan (ENTRESTO) 97-103 MG Take 1 tablet by mouth 2 (two) times daily.   spironolactone (ALDACTONE) 25 MG tablet Take 1 tablet (25 mg total) by mouth daily.   naloxone (NARCAN) nasal spray 4 mg/0.1 mL Narcan 4 mg/actuation nasal spray  CALL 911. SPR CONTENTS OF ONE SPRAYER (0.1ML) INTO ONE NOSTRIL. REPEAT IN 2-3 MIN IF SYMPTOMS OF OPIOID EMERGENCY PERSIST, ALTERNATE NOSTRILS   No facility-administered encounter medications on file as of 05/28/2021.    Allergies (verified) Patient has no known allergies.   History: Past Medical History:  Diagnosis Date   Acid phosphatase elevated    Aortic atherosclerosis (HCC)    Atrial fibrillation (HCC)    AV block, 1st degree  COPD (chronic obstructive pulmonary disease) (HCC)    HFrEF (heart failure with reduced ejection fraction) ([place])    a. 04/2017 Echo: EF 30-35%, antsept HK, mild MR, mod dil LA, mildly dil RA, mod TR, mildly to mod elev PASP; b. 08/2017 Echo: EF 30-35%, diff HK, antsept HK. Gr1 DD. Mild to mod MR. PASP nl.   Hx of cervical malignancy    Hyperlipidemia    Hypertension    LBBB  (left bundle branch block)    Metabolic syndrome    NICM (nonischemic cardiomyopathy) ([place])    a. 04/2017 Echo: EF 30-35%; b. 04/2017 Cath: nonobs dzs; c. 08/2017 Echo: EF 30-35%, diff HK, antsept HK. Gr1 DD. Mild to mod MR. PASP nl.   Non-obstructive CAD (coronary artery disease)    a. 04/2017 Cath: mild nonobs CAD. EF 25-35%. CO 3.14, CI 1.38. Sev PAH [61/34(47].   Obesity, Class III, BMI 40-49.9 (morbid obesity) (HCC)    Osteoarthritis of both knees    PAH (pulmonary arterial hypertension) with portal hypertension ([place])    a. 04/2017 Right Heart Cath: Sev PAH 61/34(47); b. 08/2017 Echo: nl PASP.   Past Surgical History:  Procedure Laterality Date   ABDOMINAL HYSTERECTOMY  1996   CARDIAC CATHETERIZATION  2018   d/t CHF   CATARACT EXTRACTION Left 2008   CATARACT EXTRACTION W/PHACO Right 03/11/2021   Procedure: CATARACT EXTRACTION PHACO AND INTRAOCULAR LENS PLACEMENT ([place]) RIGHT;  Surgeon: Leandrew Koyanagi, MD;  Location: [place];  Service: Ophthalmology;  Laterality: Right;  7.70 01:31.1   JOINT REPLACEMENT Right 06/2020   KNEE CLOSED REDUCTION Right 10/29/2020   Procedure: CLOSED MANIPULATION KNEE;  Surgeon: Lovell Sheehan, MD;  Location: [facility];  Service: Orthopedics;  Laterality: Right;   RIGHT/LEFT HEART CATH AND CORONARY ANGIOGRAPHY N/A 04/21/2017   Procedure: RIGHT/LEFT HEART CATH AND CORONARY ANGIOGRAPHY;  Surgeon: Wellington Hampshire, MD;  Location: [facility];  Service: Cardiovascular;  Laterality: N/A;   TOTAL KNEE ARTHROPLASTY Right 07/02/2020   Procedure: TOTAL KNEE ARTHROPLASTY;  Surgeon: Lovell Sheehan, MD;  Location: [facility];  Service: Orthopedics;  Laterality: Right;   Family History  Problem Relation Age of Onset   Hypertension Mother    Congestive Heart Failure Mother    Congestive Heart Failure Father    Prostate cancer Son    Social History   Socioeconomic History   Marital status: Widowed    Spouse name: Not on file   Number of  children: 4   Years of education: 63   Highest education level: 12th grade  Occupational History   Occupation: helps disabled persons    Comment: part time  Tobacco Use   Smoking status: Former    Years: 10.00    Types: Cigarettes    Start date: 09/14/1975    Quit date: 06/22/1986    Years since quitting: 34.9   Smokeless tobacco: Never  Vaping Use   Vaping Use: Never used  Substance and Sexual Activity   Alcohol use: No    Alcohol/week: 0.0 standard drinks   Drug use: No   Sexual activity: Not Currently  Other Topics Concern   Not on file  Social History Narrative   Patient has 4 grown children-1 of which is deceased, 25 grandchildren & 5 great-grandchildren      Patient lives with her grandson.    Social Determinants of Health   Financial Resource Strain: Low Risk    Difficulty of Paying Living Expenses: Not hard at all  Food  Insecurity: No Food Insecurity   Worried About Charity fundraiser in the Last Year: Never true   Ran Out of Food in the Last Year: Never true  Transportation Needs: No Transportation Needs   Lack of Transportation (Medical): No   Lack of Transportation (Non-Medical): No  Physical Activity: Inactive   Days of Exercise per Week: 0 days   Minutes of Exercise per Session: 0 min  Stress: No Stress Concern Present   Feeling of Stress : Not at all  Social Connections: Moderately Isolated   Frequency of Communication with Friends and Family: More than three times a week   Frequency of Social Gatherings with Friends and Family: Three times a week   Attends Religious Services: More than 4 times per year   Active Member of Clubs or Organizations: No   Attends Archivist Meetings: Never   Marital Status: Widowed    Tobacco Counseling Counseling given: Not Answered   Clinical Intake:  Pre-visit preparation completed: Yes  Pain : No/denies pain     Nutritional Risks: None Diabetes: No  How often do you need to have someone help you  when you read instructions, pamphlets, or other written materials from your doctor or pharmacy?: 1 - Never    Interpreter Needed?: No  Information entered by :: Clemetine Marker LPN   Activities of Daily Living In your present state of health, do you have any difficulty performing the following activities: 05/28/2021 03/11/2021  Hearing? N N  Vision? N N  Difficulty concentrating or making decisions? N N  Walking or climbing stairs? N N  Comment - -  Dressing or bathing? N N  Doing errands, shopping? [place] and eating ? N -  Using the Toilet? N -  In the past six months, have you accidently leaked urine? Y -  Comment pt wears pads/depends for protection -  Do you have problems with loss of bowel control? N -  Managing your Medications? N -  Managing your Finances? N -  Housekeeping or managing your Housekeeping? N -  Some recent data might be hidden    Patient Care Team: Steele Sizer, MD as PCP - General (Family Medicine) Wellington Hampshire, MD as PCP - Cardiology (Cardiology) Alisa Graff, FNP as Nurse Practitioner (Family Medicine) Wellington Hampshire, MD as Consulting Physician (Cardiology)  Indicate any recent Medical Services you may have received from other than [facility] providers in the past year (date may be approximate).     Assessment:   This is a routine wellness examination for [facility].  Hearing/Vision screen Hearing Screening - Comments:: Pt has no difficulty hearing  Vision Screening - Comments:: [facility] vision screenings done at [facility]  Dietary issues and exercise activities discussed: Current Exercise Habits: The patient has a physically strenuous job, but has no regular exercise apart from work., Exercise limited by: orthopedic condition(s)   Goals Addressed             This Visit's Progress    Increase physical activity   On track    Pt would like to increase physical activity after knee replacement this year.         Depression Screen PHQ 2/9 Scores 05/28/2021 01/07/2021 06/18/2020 05/22/2020 04/16/2020 01/09/2020 02/27/2019  PHQ - 2 Score 0 0 0 0 0 0 0  PHQ- 9 Score - - 0 - - 0 0    Fall Risk Fall Risk  05/28/2021 01/07/2021  06/18/2020 05/22/2020 04/16/2020  Falls in the past year? 0 0 0 0 0  Number falls in past yr: 0 0 0 0 -  Injury with Fall? 0 0 0 0 -  Risk for fall due to : No Fall Risks - - No Fall Risks Other (Comment)  Risk for fall due to: Comment - - - - Hx of knee/joint pain  Follow up Falls prevention discussed - - Falls prevention discussed Falls prevention discussed    FALL RISK PREVENTION PERTAINING TO THE HOME:  Any stairs in or around the home? Yes  If so, are there any without handrails? No  Home free of loose throw rugs in walkways, pet beds, electrical cords, etc? Yes  Adequate lighting in your home to reduce risk of falls? Yes   ASSISTIVE DEVICES UTILIZED TO PREVENT FALLS:  Life alert? No  Use of a cane, walker or w/c? No  Grab bars in the bathroom? Yes  Shower chair or bench in shower? Yes  Elevated toilet seat or a handicapped toilet? No   TIMED UP AND GO:  Was the test performed? No . Telephonic visit.   Cognitive Function: Normal cognitive status assessed by direct observation by this Nurse Health Advisor. No abnormalities found.       6CIT Screen 05/22/2020 11/07/2018  What Year? 0 points 0 points  What month? 0 points 0 points  What time? 0 points 0 points  Count back from 20 0 points 0 points  Months in reverse 0 points 0 points  Repeat phrase 2 points 4 points  Total Score 2 4    Immunizations Immunization History  Administered Date(s) Administered   Influenza, High Dose Seasonal PF 06/22/2016, 07/18/2018, 05/15/2019   Influenza, Seasonal, Injecte, Preservative Fre 07/23/2014   Influenza,inj,Quad PF,6+ Mos 05/30/2015, 07/01/2017   Influenza-Unspecified 07/10/2013   Moderna Sars-Covid-2 Vaccination 09/13/2019, 10/12/2019, 08/15/2020, 12/15/2020    Pneumococcal Conjugate-13 05/30/2015   Pneumococcal Polysaccharide-23 05/02/2012   Tdap 05/02/2012   Zoster, Live 07/23/2014    TDAP status: Up to date  Flu Vaccine status: Due, Education has been provided regarding the importance of this vaccine. Advised may receive this vaccine at local pharmacy or Health Dept. Aware to provide a copy of the vaccination record if obtained from local pharmacy or Health Dept. Verbalized acceptance and understanding.  Pneumococcal vaccine status: Up to date  Covid-19 vaccine status: Completed vaccines  Qualifies for Shingles Vaccine? Yes   Zostavax completed Yes   Shingrix Completed?: No.    Education has been provided regarding the importance of this vaccine. Patient has been advised to call insurance company to determine out of pocket expense if they have not yet received this vaccine. Advised may also receive vaccine at local pharmacy or Health Dept. Verbalized acceptance and understanding.  Screening Tests Health Maintenance  Topic Date Due   Zoster Vaccines- Shingrix (1 of 2) Never done   INFLUENZA VACCINE  04/13/2021   TETANUS/TDAP  05/02/2022   DEXA SCAN  Completed   COVID-19 Vaccine  Completed   PNA vac Low Risk Adult  Completed   HPV VACCINES  Aged Out    Health Maintenance  Health Maintenance Due  Topic Date Due   Zoster Vaccines- Shingrix (1 of 2) Never done   INFLUENZA VACCINE  04/13/2021    Colorectal cancer screening: No longer required.   Mammogram status: No longer required due to age.  Bone Density status: no longer required due to age.  Lung Cancer Screening: (Low Dose CT Chest  recommended if Age 37-80 years, 2 pack-year currently smoking OR have quit w/in 15years.) does not qualify.   Additional Screening:  Hepatitis C Screening: does not qualify  Vision Screening: Recommended annual ophthalmology exams for early detection of glaucoma and other disorders of the eye. Is the patient up to date with their annual eye  exam?  Yes  Who is the provider or what is the name of the office in which the patient attends annual eye exams? [facility].   Dental Screening: Recommended annual dental exams for proper oral hygiene  Community Resource Referral / Chronic Care Management: CRR required this visit?  No   CCM required this visit?  No      Plan:     I have personally reviewed and noted the following in the patient's chart:   Medical and social history Use of alcohol, tobacco or illicit drugs  Current medications and supplements including opioid prescriptions.  Functional ability and status Nutritional status Physical activity Advanced directives List of other physicians Hospitalizations, surgeries, and ER visits in previous 12 months Vitals Screenings to include cognitive, depression, and falls Referrals and appointments  In addition, I have reviewed and discussed with patient certain preventive protocols, quality metrics, and best practice recommendations. A written personalized care plan for preventive services as well as general preventive health recommendations were provided to patient.     Clemetine Marker, LPN   [id number]   Nurse Notes: none

## 2021-06-03 NOTE — Progress Notes (Signed)
Cardiology Office Note   Date:  06/04/2021   ID:  Kimberly, Henry 11/04/1940, MRN [id number]  PCP:  Steele Sizer, MD  Cardiologist:   Kathlyn Sacramento, MD   Chief Complaint  Patient presents with   Other    OD follow up c/o left leg pain hard to stand on her legs. Meds reviewed verbally with pt.       History of Present Illness: Kimberly Henry is a 80 y.o. female who is here today for follow-up visit regarding chronic systolic heart failure due to nonischemic cardiomyopathy and chronic atrial fibrillation. She has known history of essential hypertension, obesity, COPD, hyperlipidemia and osteoarthritis.She is a previous smoker but quit many years ago. She was diagnosed with systolic heart failure in August of 2018.   She was noted to have left bundle branch block.  Cardiac catheterization at that time showed mild nonobstructive coronary artery disease with an EF of 25-30%. Right heart catheterization showed severely elevated filling pressures, severely reduced cardiac output and severe pulmonary hypertension. She was treated with medical therapy and has done well with medications.  She declined CRT at some point given improvement in her symptoms.  Most recent echocardiogram in May 2021 showed an EF of 50 to 55%.  Left atrium was severely dilated.  She was diagnosed with atrial fibrillation around the same time and I felt that the chance of maintaining sinus rhythm was going to be low without an antiarrhythmic medication.  Given lack of symptoms related to atrial fibrillation, I continued rate control and she has been doing well.    She has been doing well with no reported chest pain, shortness of breath or palpitations.  She had previous right knee replacement.  She does have significant arthritis affecting the left knee and might need surgery on that side as well.  She complains of discomfort below the left knee that is mostly constant over the last few days and gets worse as she  stands up.    Past Medical History:  Diagnosis Date   Acid phosphatase elevated    Aortic atherosclerosis (HCC)    Atrial fibrillation (HCC)    AV block, 1st degree    COPD (chronic obstructive pulmonary disease) (HCC)    HFrEF (heart failure with reduced ejection fraction) ([place])    a. 04/2017 Echo: EF 30-35%, antsept HK, mild MR, mod dil LA, mildly dil RA, mod TR, mildly to mod elev PASP; b. 08/2017 Echo: EF 30-35%, diff HK, antsept HK. Gr1 DD. Mild to mod MR. PASP nl.   Hx of cervical malignancy    Hyperlipidemia    Hypertension    LBBB (left bundle branch block)    Metabolic syndrome    NICM (nonischemic cardiomyopathy) ([place])    a. 04/2017 Echo: EF 30-35%; b. 04/2017 Cath: nonobs dzs; c. 08/2017 Echo: EF 30-35%, diff HK, antsept HK. Gr1 DD. Mild to mod MR. PASP nl.   Non-obstructive CAD (coronary artery disease)    a. 04/2017 Cath: mild nonobs CAD. EF 25-35%. CO 3.14, CI 1.38. Sev PAH [61/34(47].   Obesity, Class III, BMI 40-49.9 (morbid obesity) (HCC)    Osteoarthritis of both knees    PAH (pulmonary arterial hypertension) with portal hypertension ([place])    a. 04/2017 Right Heart Cath: Sev PAH 61/34(47); b. 08/2017 Echo: nl PASP.    Past Surgical History:  Procedure Laterality Date   ABDOMINAL HYSTERECTOMY  1996   CARDIAC CATHETERIZATION  2018   d/t CHF   CATARACT  EXTRACTION Left 2008   CATARACT EXTRACTION W/PHACO Right 03/11/2021   Procedure: CATARACT EXTRACTION PHACO AND INTRAOCULAR LENS PLACEMENT ([place]) RIGHT;  Surgeon: Leandrew Koyanagi, MD;  Location: [place];  Service: Ophthalmology;  Laterality: Right;  7.70 01:31.1   JOINT REPLACEMENT Right 06/2020   KNEE CLOSED REDUCTION Right 10/29/2020   Procedure: CLOSED MANIPULATION KNEE;  Surgeon: Lovell Sheehan, MD;  Location: [facility];  Service: Orthopedics;  Laterality: Right;   RIGHT/LEFT HEART CATH AND CORONARY ANGIOGRAPHY N/A 04/21/2017   Procedure: RIGHT/LEFT HEART CATH AND CORONARY ANGIOGRAPHY;  Surgeon: Wellington Hampshire, MD;  Location: [place] [facility];  Service: Cardiovascular;  Laterality: N/A;   TOTAL KNEE ARTHROPLASTY Right 07/02/2020   Procedure: TOTAL KNEE ARTHROPLASTY;  Surgeon: Lovell Sheehan, MD;  Location: [facility];  Service: Orthopedics;  Laterality: Right;     Current Outpatient Medications  Medication Sig Dispense Refill   acetaminophen (TYLENOL) 500 MG tablet Take 1,000 mg by mouth every 6 (six) hours as needed for pain.      apixaban (ELIQUIS) 5 MG TABS tablet Take 1 tablet (5 mg total) by mouth 2 (two) times daily. 180 tablet 1   carvedilol (COREG) 6.25 MG tablet Take 1 tablet (6.25 mg total) by mouth 2 (two) times daily with a meal. 180 tablet 0   digoxin (LANOXIN) 0.125 MG tablet Take 1 tablet (0.125 mg total) by mouth daily. 90 tablet 0   furosemide (LASIX) 40 MG tablet TAKE 1 TABLET BY MOUTH  DAILY (Patient taking differently: Take 40 mg by mouth daily.) 90 tablet 3   levocetirizine (XYZAL) 5 MG tablet TAKE 1 TABLET BY MOUTH IN  THE EVENING 90 tablet 1   methocarbamol (ROBAXIN) 500 MG tablet Take 1 tablet (500 mg total) by mouth every 6 (six) hours as needed for muscle spasms. 40 tablet 1   Multiple Vitamin (MULTIVITAMIN WITH MINERALS) TABS tablet Take 1 tablet by mouth daily.     naloxone (NARCAN) nasal spray 4 mg/0.1 mL Narcan 4 mg/actuation nasal spray  CALL 911. SPR CONTENTS OF ONE SPRAYER (0.1ML) INTO ONE NOSTRIL. REPEAT IN 2-3 MIN IF SYMPTOMS OF OPIOID EMERGENCY PERSIST, ALTERNATE NOSTRILS     potassium chloride SA (KLOR-CON) 20 MEQ tablet TAKE 1 TABLET BY MOUTH  DAILY 90 tablet 3   rosuvastatin (CRESTOR) 40 MG tablet Take 1 tablet (40 mg total) by mouth daily. 90 tablet 0   sacubitril-valsartan (ENTRESTO) 97-103 MG Take 1 tablet by mouth 2 (two) times daily. 180 tablet 0   spironolactone (ALDACTONE) 25 MG tablet Take 1 tablet (25 mg total) by mouth daily. 90 tablet 0   No current facility-administered medications for this visit.    Allergies:   Patient has no  known allergies.    Social History:  The patient  reports that she quit smoking about 34 years ago. Her smoking use included cigarettes. She started smoking about 45 years ago. She has never used smokeless tobacco. She reports that she does not drink alcohol and does not use drugs.   Family History:  The patient's family history includes Congestive Heart Failure in her father and mother; Hypertension in her mother; Prostate cancer in her son.    ROS:  Please see the history of present illness.   Otherwise, review of systems are positive for none.   All other systems are reviewed and negative.    PHYSICAL EXAM: VS:  BP 130/70 (BP Location: Right Arm, Patient Position: Sitting, Cuff Size: Large)   Pulse 70  Ht 5\' 3"  (1.6 m)   Wt 264 lb 4 oz (119.9 kg)   SpO2 98%   BMI 46.81 kg/m  , BMI Body mass index is 46.81 kg/m. GEN: Well nourished, well developed, in no acute distress  HEENT: normal  Neck: no carotid bruits, or masses. No significant JVD. Cardiac: Irregularly irregular; no murmurs, rubs, or gallops, no edema Respiratory: Clear to auscultation bilaterally, normal work of breathing GI: soft, nontender, nondistended, + BS MS: no deformity or atrophy  Skin: warm and dry, no rash Neuro:  Strength and sensation are intact Psych: euthymic mood, full affect   EKG:  EKG is ordered today. Atrial fibrillation with ventricular rate of 70 bpm, left bundle branch block.   Recent Labs: 07/02/2020: B Natriuretic Peptide 163.9; Magnesium 2.0 01/07/2021: ALT 12; BUN 16; Creat 0.88; Hemoglobin 12.5; Platelets 202; Potassium 4.3; Sodium 141; TSH 3.55    Lipid Panel    Component Value Date/Time   CHOL 141 01/07/2021 0000   TRIG 101 01/07/2021 0000   HDL 48 (L) 01/07/2021 0000   CHOLHDL 2.9 01/07/2021 0000   VLDL 22 03/02/2017 1242   LDLCALC 75 01/07/2021 0000      Wt Readings from Last 3 Encounters:  06/04/21 264 lb 4 oz (119.9 kg)  03/11/21 257 lb (116.6 kg)  01/07/21 253 lb  (114.8 kg)       No flowsheet data found.    ASSESSMENT AND PLAN:  1.  Chronic systolic heart failure: Due to nonischemic cardiomyopathy.  Currently New York Heart Association class II.  She appears to be euvolemic on torsemide 40 mg once daily but she can use this medication as needed and this was discussed with her today.    She is on optimal medical therapy.  Most recent echocardiogram showed improvement in ejection fraction to 50 to 55%.  Given improvement in symptoms and ejection fraction, I elected to discontinue digoxin today.  2. Essential hypertension: Blood pressure is well controlled on current medications.  3. Hyperlipidemia: Continue rosuvastatin.  Recent lipid profile showed an LDL of 75.  4.  Chronic atrial fibrillation: Ventricular rate is controlled with carvedilol and she is tolerating anticoagulation with Eliquis.   5.  Left knee pain: Does not seem to be vascular.  Distal pulses are palpable.  I asked her to follow-up with her primary care physician and orthopedics regarding this.    Disposition:   FU in 6 months  Signed,  Kathlyn Sacramento, MD  06/04/2021 8:34 AM    [place]

## 2021-06-04 ENCOUNTER — Encounter: Payer: Self-pay | Admitting: Cardiovascular Disease

## 2021-06-04 ENCOUNTER — Ambulatory Visit: Payer: Medicare Other | Admitting: Cardiovascular Disease

## 2021-06-04 ENCOUNTER — Other Ambulatory Visit: Payer: Self-pay

## 2021-06-04 VITALS — BP 130/70 | HR 70 | Ht 63.0 in | Wt 264.2 lb

## 2021-06-04 DIAGNOSIS — I5022 Chronic systolic (congestive) heart failure: Secondary | ICD-10-CM | POA: Diagnosis not present

## 2021-06-04 DIAGNOSIS — I482 Chronic atrial fibrillation, unspecified: Secondary | ICD-10-CM | POA: Diagnosis not present

## 2021-06-04 DIAGNOSIS — I10 Essential (primary) hypertension: Secondary | ICD-10-CM

## 2021-06-04 DIAGNOSIS — E785 Hyperlipidemia, unspecified: Secondary | ICD-10-CM | POA: Diagnosis not present

## 2021-06-04 NOTE — Patient Instructions (Signed)
Medication Instructions:  Your physician has recommended you make the following change in your medication:   STOP Digoxin  Continue your other medications as prescribed.  *If you need a refill on your cardiac medications before your next appointment, please call your pharmacy*   Lab Work: None ordered  If you have labs (blood work) drawn today and your tests are completely normal, you will receive your results only by: [place] (if you have MyChart) OR A paper copy in the mail If you have any lab test that is abnormal or we need to change your treatment, we will call you to review the results.   Testing/Procedures: None ordered   Follow-Up: At [facility], you and your health needs are our priority.  As part of our continuing mission to provide you with exceptional heart care, we have created designated Provider Care Teams.  These Care Teams include your primary Cardiologist (physician) and Advanced Practice Providers (APPs -  Physician Assistants and Nurse Practitioners) who all work together to provide you with the care you need, when you need it.  We recommend signing up for the patient portal called "MyChart".  Sign up information is provided on this After Visit Summary.  MyChart is used to connect with patients for Virtual Visits (Telemedicine).  Patients are able to view lab/test results, encounter notes, upcoming appointments, etc.  Non-urgent messages can be sent to your provider as well.   To learn more about what you can do with MyChart, go to [email or web address].    Your next appointment:   6 month(s)  The format for your next appointment:   In Person  Provider:   You may see Kathlyn Sacramento, MD or one of the following Advanced Practice Providers on your designated Care Team:   Murray Hodgkins, NP Christell Faith, PA-C Marrianne Mood, PA-C Cadence Kathlen Mody, [place]   Other Instructions N/A

## 2021-06-08 DIAGNOSIS — M1712 Unilateral primary osteoarthritis, left knee: Secondary | ICD-10-CM | POA: Diagnosis not present

## 2021-06-08 DIAGNOSIS — Z96651 Presence of right artificial knee joint: Secondary | ICD-10-CM | POA: Diagnosis not present

## 2021-06-09 DIAGNOSIS — M1712 Unilateral primary osteoarthritis, left knee: Secondary | ICD-10-CM | POA: Insufficient documentation

## 2021-06-09 DIAGNOSIS — M13862 Other specified arthritis, left knee: Secondary | ICD-10-CM | POA: Diagnosis not present

## 2021-06-29 ENCOUNTER — Ambulatory Visit: Payer: Self-pay | Admitting: *Deleted

## 2021-06-29 ENCOUNTER — Encounter: Payer: Self-pay | Admitting: Family Medicine

## 2021-06-29 ENCOUNTER — Telehealth (INDEPENDENT_AMBULATORY_CARE_PROVIDER_SITE_OTHER): Payer: Medicare Other | Admitting: Family Medicine

## 2021-06-29 DIAGNOSIS — U071 COVID-19: Secondary | ICD-10-CM | POA: Diagnosis not present

## 2021-06-29 MED ORDER — MOLNUPIRAVIR EUA 200MG CAPSULE: 4.0000 | ORAL_CAPSULE | Freq: Two times a day (BID) | ORAL | 0 refills | Status: AC

## 2021-06-29 MED ORDER — BENZONATATE 100 MG PO CAPS: 200.0000 mg | ORAL_CAPSULE | Freq: Two times a day (BID) | ORAL | 0 refills | Status: DC | PRN

## 2021-06-29 NOTE — Progress Notes (Signed)
Virtual Visit via Video Note  I connected with Kimberly Henry on 06/29/21 at  3:40 PM EDT by a video enabled telemedicine application and verified that I am speaking with the correct person using two identifiers.  Location: Patient: home Provider: [facility]   I discussed the limitations of evaluation and management by telemedicine and the availability of in person appointments. The patient expressed understanding and agreed to proceed.  History of Present Illness:  COVID-19 INFECTION - symptom onset 06/26/21 - COVID+ 10/16 - has received 4 doses of mRNA vaccine.   Fever: no Cough: yes Shortness of breath: no Wheezing: no Chest pain: no Chest tightness: no Chest congestion: no Nasal congestion: yes Sore throat: no Vomiting: no Rash: no Relief with OTC cold/cough medications:  some   Treatments attempted: tylenol, mucinex    Observations/Objective:  Patient had trouble connecting to video visit, entirety of visit conducted over the phone.  Speaks in full sentences, no respiratory distress.   Assessment and Plan:  [id number] Doing well with mild sx. Is a candidate for COVID treatment, molnupiravir sent to pharmacy. Reviewed OTC symptom relief, self-quarantine guidelines, and emergency precautions. Rx tessalon for cough.     I discussed the assessment and treatment plan with the patient. The patient was provided an opportunity to ask questions and all were answered. The patient agreed with the plan and demonstrated an understanding of the instructions.   The patient was advised to call back or seek an in-person evaluation if the symptoms worsen or if the condition fails to improve as anticipated.  I provided 9 minutes of non-face-to-face time during this encounter.   Myles Gip, DO

## 2021-06-29 NOTE — Telephone Encounter (Signed)
The patient has tested positive for covid 19 yesterday 06/28/21   The patient is currently experiencing cough and congestion   The patient would like to be prescribed something to help with the discomfort related to their symptoms   Please contact further   Called patient to review symptoms. No answer, left message on voicemail to call clinic back.

## 2021-06-29 NOTE — Telephone Encounter (Signed)
Pt called and stated she has tested positive for Covid. Her symptoms started 2 days ago and states is mostly nasal congestion and mild cough. Denies fever, SOB or sore throat. Pt has been fully vaccinated and is requesting some medicine to be called in for COVID. Pt has been taking mucinex for cough since yesterday. Also takes tylenol for arthritis. Pt requests that medications be called in today for cough/congestion. No available appts with PCP for today, pt agrees to see any provider. Virtual appt scheduled today at 1540 with Dr. Ky Barban. Pt wants to be called on her mobile # for appt [phone]. Care advice given and pt verbalized understanding.   Summary: cough and congestion   The patient has tested positive for covid 19 yesterday 06/28/21   The patient is currently experiencing cough and congestion   The patient would like to be prescribed something to help with the discomfort related to their symptoms   Please contact further       Reason for Disposition  [1] HIGH RISK for severe COVID complications (e.g., weak immune system, age > 27 years, obesity with BMI > 25, pregnant, chronic lung disease or other chronic medical condition) AND [2] COVID symptoms (e.g., cough, fever)  (Exceptions: Already seen by PCP and no new or worsening symptoms.)  Answer Assessment - Initial Assessment Questions 1. COVID-19 DIAGNOSIS: "Who made your COVID-19 diagnosis?" "Was it confirmed by a positive lab test or self-test?" If not diagnosed by a doctor (or NP/PA), ask "Are there lots of cases (community spread) where you live?" Note: See public health department website, if unsure.     Job  2. COVID-19 EXPOSURE: "Was there any known exposure to COVID before the symptoms began?" CDC Definition of close contact: within 6 feet (2 meters) for a total of 15 minutes or more over a 24-hour period.      Yes, last Tuesday 3. ONSET: "When did the COVID-19 symptoms start?"      2 days ago 4. WORST SYMPTOM: "What is  your worst symptom?" (e.g., cough, fever, shortness of breath, muscle aches)     Cough, nasal congestion 5. COUGH: "Do you have a cough?" If Yes, ask: "How bad is the cough?"       mild 6. FEVER: "Do you have a fever?" If Yes, ask: "What is your temperature, how was it measured, and when did it start?"     No 7. RESPIRATORY STATUS: "Describe your breathing?" (e.g., shortness of breath, wheezing, unable to speak)      No 8. BETTER-SAME-WORSE: "Are you getting better, staying the same or getting worse compared to yesterday?"  If getting worse, ask, "In what way?"     same 9. HIGH RISK DISEASE: "Do you have any chronic medical problems?" (e.g., asthma, heart or lung disease, weak immune system, obesity, etc.)     CHF 10. VACCINE: "Have you had the COVID-19 vaccine?" If Yes, ask: "Which one, how many shots, when did you get it?"       Yes, moderna 11. BOOSTER: "Have you received your COVID-19 booster?" If Yes, ask: "Which one and when did you get it?"       2, moderna 12. PREGNANCY: "Is there any chance you are pregnant?" "When was your last menstrual period?"       No 13. OTHER SYMPTOMS: "Do you have any other symptoms?"  (e.g., chills, fatigue, headache, loss of smell or taste, muscle pain, sore throat)       Nasal congestion 14. O2  SATURATION MONITOR:  "Do you use an oxygen saturation monitor (pulse oximeter) at home?" If Yes, ask "What is your reading (oxygen level) today?" "What is your usual oxygen saturation reading?" (e.g., 95%)       96  Protocols used: Coronavirus (COVID-19) Diagnosed or Suspected-A-AH

## 2021-06-29 NOTE — Patient Instructions (Addendum)
It was great to see you!  Our plans for today:  - See below for self-isolation guidelines. You may end your quarantine once you are 10 days from symptom onset and fever free for 24 hours without use of tylenol or ibuprofen. Wear a well-fitting N95 mask if you have to go out and about. - Take the molnupiravir as directed. See [email or web address] for more information about the medication. - You can try Afrin nasal decongestant also for your symptoms. - Certainly, if you are having difficulties breathing or unable to keep down fluids, go to the Emergency Department.   Take care and seek immediate care sooner if you develop any concerns.   Dr. Ky Barban     Person Under Monitoring Name: Kimberly Henry  Location: [place] [id number]   Infection Prevention Recommendations for Individuals Confirmed to have, or Being Evaluated for, 2019 Novel Coronavirus (COVID-19) Infection Who Receive Care at Home  Individuals who are confirmed to have, or are being evaluated for, COVID-19 should follow the prevention steps below until a healthcare provider or local or state health department says they can return to normal activities.  Stay home except to get medical care You should restrict activities outside your home, except for getting medical care. Do not go to work, school, or public areas, and do not use public transportation or taxis.  Call ahead before visiting your doctor Before your medical appointment, call the healthcare provider and tell them that you have, or are being evaluated for, COVID-19 infection. This will help the healthcare provider's office take steps to keep other people from getting infected. Ask your healthcare provider to call the local or state health department.  Monitor your symptoms Seek prompt medical attention if your illness is worsening (e.g., difficulty breathing). Before going to your medical appointment, call the  healthcare provider and tell them that you have, or are being evaluated for, COVID-19 infection. Ask your healthcare provider to call the local or state health department.  Wear a facemask You should wear a facemask that covers your nose and mouth when you are in the same room with other people and when you visit a healthcare provider. People who live with or visit you should also wear a facemask while they are in the same room with you.  Separate yourself from other people in your home As much as possible, you should stay in a different room from other people in your home. Also, you should use a separate bathroom, if available.  Avoid sharing household items You should not share dishes, drinking glasses, cups, eating utensils, towels, bedding, or other items with other people in your home. After using these items, you should wash them thoroughly with soap and water.  Cover your coughs and sneezes Cover your mouth and nose with a tissue when you cough or sneeze, or you can cough or sneeze into your sleeve. Throw used tissues in a lined trash can, and immediately wash your hands with soap and water for at least 20 seconds or use an alcohol-based hand rub.  Wash your [facility] your hands often and thoroughly with soap and water for at least 20 seconds. You can use an alcohol-based hand sanitizer if soap and water are not available and if your hands are not visibly dirty. Avoid touching your eyes, nose, and mouth with unwashed hands.   Prevention Steps for Caregivers and Household Members of Individuals Confirmed to have, or Being Evaluated for, COVID-19 Infection Being Cared for in  the Home  If you live with, or provide care at home for, a person confirmed to have, or being evaluated for, COVID-19 infection please follow these guidelines to prevent infection:  Follow healthcare provider's instructions Make sure that you understand and can help the patient follow any healthcare  provider instructions for all care.  Provide for the patient's basic needs You should help the patient with basic needs in the home and provide support for getting groceries, prescriptions, and other personal needs.  Monitor the patient's symptoms If they are getting sicker, call his or her medical provider and tell them that the patient has, or is being evaluated for, COVID-19 infection. This will help the healthcare provider's office take steps to keep other people from getting infected. Ask the healthcare provider to call the local or state health department.  Limit the number of people who have contact with the patient If possible, have only one caregiver for the patient. Other household members should stay in another home or place of residence. If this is not possible, they should stay in another room, or be separated from the patient as much as possible. Use a separate bathroom, if available. Restrict visitors who do not have an essential need to be in the home.  Keep older adults, very young children, and other sick people away from the patient Keep older adults, very young children, and those who have compromised immune systems or chronic health conditions away from the patient. This includes people with chronic heart, lung, or kidney conditions, diabetes, and cancer.  Ensure good ventilation Make sure that shared spaces in the home have good air flow, such as from an air conditioner or an opened window, weather permitting.  Wash your hands often Wash your hands often and thoroughly with soap and water for at least 20 seconds. You can use an alcohol based hand sanitizer if soap and water are not available and if your hands are not visibly dirty. Avoid touching your eyes, nose, and mouth with unwashed hands. Use disposable paper towels to dry your hands. If not available, use dedicated cloth towels and replace them when they become wet.  Wear a facemask and gloves Wear a  disposable facemask at all times in the room and gloves when you touch or have contact with the patient's blood, body fluids, and/or secretions or excretions, such as sweat, saliva, sputum, nasal mucus, vomit, urine, or feces.  Ensure the mask fits over your nose and mouth tightly, and do not touch it during use. Throw out disposable facemasks and gloves after using them. Do not reuse. Wash your hands immediately after removing your facemask and gloves. If your personal clothing becomes contaminated, carefully remove clothing and launder. Wash your hands after handling contaminated clothing. Place all used disposable facemasks, gloves, and other waste in a lined container before disposing them with other household waste. Remove gloves and wash your hands immediately after handling these items.  Do not share dishes, glasses, or other household items with the patient Avoid sharing household items. You should not share dishes, drinking glasses, cups, eating utensils, towels, bedding, or other items with a patient who is confirmed to have, or being evaluated for, COVID-19 infection. After the person uses these items, you should wash them thoroughly with soap and water.  Wash laundry thoroughly Immediately remove and wash clothes or bedding that have blood, body fluids, and/or secretions or excretions, such as sweat, saliva, sputum, nasal mucus, vomit, urine, or feces, on them. Wear gloves when  handling laundry from the patient. Read and follow directions on labels of laundry or clothing items and detergent. In general, wash and dry with the warmest temperatures recommended on the label.  Clean all areas the individual has used often Clean all touchable surfaces, such as counters, tabletops, doorknobs, bathroom fixtures, toilets, phones, keyboards, tablets, and bedside tables, every day. Also, clean any surfaces that may have blood, body fluids, and/or secretions or excretions on them. Wear gloves when  cleaning surfaces the patient has come in contact with. Use a diluted bleach solution (e.g., dilute bleach with 1 part bleach and 10 parts water) or a household disinfectant with a label that says EPA-registered for coronaviruses. To make a bleach solution at home, add 1 tablespoon of bleach to 1 quart (4 cups) of water. For a larger supply, add  cup of bleach to 1 gallon (16 cups) of water. Read labels of cleaning products and follow recommendations provided on product labels. Labels contain instructions for safe and effective use of the cleaning product including precautions you should take when applying the product, such as wearing gloves or eye protection and making sure you have good ventilation during use of the product. Remove gloves and wash hands immediately after cleaning.  Monitor yourself for signs and symptoms of illness Caregivers and household members are considered close contacts, should monitor their health, and will be asked to limit movement outside of the home to the extent possible. Follow the monitoring steps for close contacts listed on the symptom monitoring form.   ? If you have additional questions, contact your local health department or call the epidemiologist on call at [phone] (available 24/7). ? This guidance is subject to change. For the most up-to-date guidance from [facility], please refer to their website: [email or web address]

## 2021-07-01 ENCOUNTER — Other Ambulatory Visit: Payer: Self-pay | Admitting: Family

## 2021-07-02 ENCOUNTER — Other Ambulatory Visit: Payer: Self-pay | Admitting: Family Medicine

## 2021-07-02 DIAGNOSIS — J302 Other seasonal allergic rhinitis: Secondary | ICD-10-CM

## 2021-07-08 NOTE — Progress Notes (Signed)
Name: Kimberly Henry   MRN: [id number]    DOB: Dec 11, 1940   Date:07/09/2021       Progress Note  Subjective  Chief Complaint  Follow Up  HPI  CHF: diagnosed in 04/2017, secondary to non ischemic cardiomyopathy, no longer going to CHF clinic but saw Dr. Fletcher Anon 09/22  She is on beta-blocker, spironolactone, lasix and Entresto, but off digoxin since 09/22 . She gained 13 lbs since last visit, but stable since visit with cardiologist last month .She denies side effects of medication. She denies any current swelling, denies orthopnea, but has sob with moderate activity. Denies PND.Cath done in 2018 showed severe pulmonary hypertension   Atrial Fibrilation: on Eliquis and is rate controlled with carvedilol, no side effects, denies blood in stools or gum. Denies bruisiing  Study Conclusions / ECHO 08/26/2017   - Left ventricle: The cavity size was mildly dilated. Systolic    function was moderately to severely reduced. The estimated    ejection fraction was in the range of 30% to 35%. Diffuse    hypokinesis. Hypokinesis of the anteroseptal myocardium. Doppler    parameters are consistent with abnormal left ventricular    relaxation (grade 1 diastolic dysfunction).  - Mitral valve: There was mild to moderate regurgitation.  - Left atrium: The atrium was moderately dilated.  - Right ventricle: Systolic function was normal.  - Pulmonary arteries: Systolic pressure was within the normal    range.   Cardiac cath 04/21/2017  1. Mild nonobstructive coronary artery disease. 2. Moderately to severely reduced LV systolic function with an EF of 25-35%. 3. Right heart catheterization showed moderately elevated filling pressures, severe pulmonary hypertension and severely reduced cardiac output at 3.14 with a cardiac index of 1.38. Pulmonary Wedge pressure was 27 mmHg and PA pressure was 61/34 with a mean of 47 mmHg   Elevated TSH: found on labs back in 04/2017, last checked back to normal, weight is  going up abut she states not as active and also eating a lot of sweets, she will resume a healthier diet    HTN: she has been taking medication daily, no chest pain or palpitation, denies dizziness. Continue current regiment    OA: she has a long history of OA of both knees. She had total right  knee replacement done by Dr. Harlow Mares 06/2020 and had to go back for intra operative manipulation 10/2020, she states today her pain level is zero, but still has some stiffness, she had PT. She still using a cane but mostly to help with pain on left lower leg, usually on left ankle. She has been wearing a brace to control ankle pain.    Perennial allergic rhinitis: she states symptoms controlled with medication denies rhinorrhea, nasal congestion or sneezing.  Unchanged   Chronic bronchitis: she smoked about 1 pack a day for about 20 years but quit many years ago, she states she has been doing well, no cough, wheezing has SOB with moderate activity only. She recently had COVID and took medication but back to baseline today   Morbid Obesity: weight is going up, reminded her to try to resume a healthier diet and lose weight again     Atherosclerosis of aorta: on statin therapy and bp is controlled  Dyslipidemia: she is now on Rosuvastatin 40 mg and last LDL was 75   Patient Active Problem List   Diagnosis Date Noted   Arthritis of left knee 06/09/2021   S/P total knee arthroplasty 07/03/2020   S/P  TKR (total knee replacement) using cement, right 07/02/2020   COPD (chronic obstructive pulmonary disease) (HCC)    CAD (coronary artery disease)    Atrial fibrillation (HCC)    Bradycardia    Pre-bariatric surgery nutrition evaluation 06/24/2020   Lung nodule seen on imaging study 01/09/2020   NICM (nonischemic cardiomyopathy) ([place]) 01/09/2020   Ventricular tachycardia 07/18/2018   Morbid obesity with BMI of 40.0-44.9, adult ([place]) 37/16/9678   Chronic systolic heart failure ([place]) 04/28/2017    Hypokalemia 04/19/2017   Elevated troponin 04/19/2017   Hyperglycemia 04/19/2017   Aortic atherosclerosis ([place]) 03/10/2017   Anemia, unspecified 03/08/2017   Cardiomegaly 03/04/2017   Perennial allergic rhinitis with seasonal variation 05/30/2015   1st degree AV block 03/25/2015   Morbid (severe) obesity due to excess calories ([place]) 93/81/0175   Metabolic syndrome 07/07/8526   History of cervical cancer 03/03/2015   Hypertension, benign 03/03/2015   Osteoarthritis, knee 03/03/2015   Dyslipidemia 03/03/2015    Past Surgical History:  Procedure Laterality Date   [place]  2018   d/t CHF   CATARACT EXTRACTION Left 2008   CATARACT EXTRACTION W/PHACO Right 03/11/2021   Procedure: CATARACT EXTRACTION PHACO AND INTRAOCULAR LENS PLACEMENT ([place]) RIGHT;  Surgeon: Leandrew Koyanagi, MD;  Location: [place];  Service: Ophthalmology;  Laterality: Right;  7.70 01:31.1   JOINT REPLACEMENT Right 06/2020   KNEE CLOSED REDUCTION Right 10/29/2020   Procedure: CLOSED MANIPULATION KNEE;  Surgeon: Lovell Sheehan, MD;  Location: [facility];  Service: Orthopedics;  Laterality: Right;   RIGHT/LEFT HEART CATH AND CORONARY ANGIOGRAPHY N/A 04/21/2017   Procedure: RIGHT/LEFT HEART CATH AND CORONARY ANGIOGRAPHY;  Surgeon: Wellington Hampshire, MD;  Location: [facility];  Service: Cardiovascular;  Laterality: N/A;   TOTAL KNEE ARTHROPLASTY Right 07/02/2020   Procedure: TOTAL KNEE ARTHROPLASTY;  Surgeon: Lovell Sheehan, MD;  Location: [facility];  Service: Orthopedics;  Laterality: Right;    Family History  Problem Relation Age of Onset   Hypertension Mother    Congestive Heart Failure Mother    Congestive Heart Failure Father    Prostate cancer Son     Social History   Tobacco Use   Smoking status: Former    Years: 10.00    Types: Cigarettes    Start date: 09/14/1975    Quit date: 06/22/1986    Years since quitting: 35.0   Smokeless  tobacco: Never  Substance Use Topics   Alcohol use: No    Alcohol/week: 0.0 standard drinks     Current Outpatient Medications:    acetaminophen (TYLENOL) 500 MG tablet, Take 1,000 mg by mouth every 6 (six) hours as needed for pain. , Disp: , Rfl:    apixaban (ELIQUIS) 5 MG TABS tablet, Take 1 tablet (5 mg total) by mouth 2 (two) times daily., Disp: 180 tablet, Rfl: 1   carvedilol (COREG) 6.25 MG tablet, Take 1 tablet (6.25 mg total) by mouth 2 (two) times daily with a meal., Disp: 180 tablet, Rfl: 0   furosemide (LASIX) 40 MG tablet, Take 1 tablet (40 mg total) by mouth daily. Needs appt for further refills, Disp: 90 tablet, Rfl: 0   levocetirizine (XYZAL) 5 MG tablet, TAKE 1 TABLET BY MOUTH IN  THE EVENING, Disp: 90 tablet, Rfl: 1   Multiple Vitamin (MULTIVITAMIN WITH MINERALS) TABS tablet, Take 1 tablet by mouth daily., Disp: , Rfl:    potassium chloride SA (KLOR-CON) 20 MEQ tablet, TAKE 1 TABLET BY MOUTH  DAILY, Disp: 90 tablet, Rfl: 3   rosuvastatin (CRESTOR) 40 MG tablet, Take 1 tablet (40 mg total) by mouth daily., Disp: 90 tablet, Rfl: 0   sacubitril-valsartan (ENTRESTO) 97-103 MG, Take 1 tablet by mouth 2 (two) times daily., Disp: 180 tablet, Rfl: 0   spironolactone (ALDACTONE) 25 MG tablet, Take 1 tablet (25 mg total) by mouth daily., Disp: 90 tablet, Rfl: 0   benzonatate (TESSALON PERLES) 100 MG capsule, Take 2 capsules (200 mg total) by mouth 2 (two) times daily as needed for cough. (Patient not taking: Reported on 07/09/2021), Disp: 20 capsule, Rfl: 0   methocarbamol (ROBAXIN) 500 MG tablet, Take 1 tablet (500 mg total) by mouth every 6 (six) hours as needed for muscle spasms. (Patient not taking: Reported on 07/09/2021), Disp: 40 tablet, Rfl: 1   naloxone (NARCAN) nasal spray 4 mg/0.1 mL, Narcan 4 mg/actuation nasal spray  CALL 911. SPR CONTENTS OF ONE SPRAYER (0.1ML) INTO ONE NOSTRIL. REPEAT IN 2-3 MIN IF SYMPTOMS OF OPIOID EMERGENCY PERSIST, ALTERNATE NOSTRILS (Patient not taking:  Reported on 07/09/2021), Disp: , Rfl:   No Known Allergies  I personally reviewed active problem list, medication list, allergies, family history, social history, health maintenance with the patient/caregiver today.   ROS  Ten systems reviewed and is negative except as mentioned in HPI   Objective  Vitals:   07/09/21 0857  BP: 128/72  Pulse: 77  Resp: 18  Temp: 98.5 F (36.9 C)  TempSrc: Oral  SpO2: 97%  Weight: 265 lb 11.2 oz (120.5 kg)  Height: 5\' 3"  (1.6 m)    Body mass index is 47.07 kg/m.  Physical Exam  Constitutional: Patient appears well-developed and well-nourished. Obese  No distress.  HEENT: head atraumatic, normocephalic, pupils equal and reactive to light, neck supple Cardiovascular: Normal rate, regular rhythm and normal heart sounds.  No murmur heard. No BLE edema. Pulmonary/Chest: Effort normal and breath sounds normal. No respiratory distress. Abdominal: Soft.  There is no tenderness. Psychiatric: Patient has a normal mood and affect. behavior is normal. Judgment and thought content normal.   PHQ2/9: Depression screen [facility] 2/9 07/09/2021 06/29/2021 05/28/2021 01/07/2021 06/18/2020  Decreased Interest 0 0 0 0 0  Down, Depressed, Hopeless 0 0 0 0 0  PHQ - 2 Score 0 0 0 0 0  Altered sleeping 0 0 - - 0  Tired, decreased energy 0 0 - - 0  Change in appetite 0 0 - - 0  Feeling bad or failure about yourself  0 0 - - 0  Trouble concentrating 0 0 - - 0  Moving slowly or fidgety/restless 0 0 - - 0  Suicidal thoughts 0 0 - - 0  PHQ-9 Score 0 0 - - 0  Difficult doing work/chores - Not difficult at all - - Not difficult at all  Some recent data might be hidden    phq 9 is negative   Fall Risk: Fall Risk  07/09/2021 06/29/2021 05/28/2021 01/07/2021 06/18/2020  Falls in the past year? 0 0 0 0 0  Number falls in past yr: - 0 0 0 0  Injury with Fall? - 0 0 0 0  Risk for fall due to : - No Fall Risks No Fall Risks - -  Risk for fall due to: Comment - - - - -   Follow up Falls prevention discussed Falls prevention discussed Falls prevention discussed - -      Functional Status Survey: Is the patient deaf or have difficulty hearing?: No Does  the patient have difficulty seeing, even when wearing glasses/contacts?: No Does the patient have difficulty concentrating, remembering, or making decisions?: No Does the patient have difficulty walking or climbing stairs?: No Does the patient have difficulty dressing or bathing?: No Does the patient have difficulty doing errands alone such as visiting a doctor's office or shopping?: No    Assessment & Plan  1. Aortic atherosclerosis (HCC)  Continue statin therapy   2. Simple chronic bronchitis ([place])  Doing well   3. Chronic systolic heart failure (HCC)  Doing well on medical management   4. Morbid obesity ([place])  Discussed with the patient the risk posed by an increased BMI. Discussed importance of portion control, calorie counting and at least 150 minutes of physical activity weekly. Avoid sweet beverages and drink more water. Eat at least 6 servings of fruit and vegetables daily    5. NICM (nonischemic cardiomyopathy) ([place])   6. Pulmonary hypertension ([place])  On first cath   7. Chronic atrial fibrillation (HCC)  Rate controlled  8. Hypertension, benign  At goal   9. Need for immunization against influenza  - Flu Vaccine QUAD High Dose(Fluad)  10. Primary osteoarthritis of both knees  Seeing ortho  11. Dyslipidemia    12. History of cervical cancer  She states she does not want to have it checked ever again

## 2021-07-09 ENCOUNTER — Other Ambulatory Visit: Payer: Self-pay

## 2021-07-09 ENCOUNTER — Ambulatory Visit (INDEPENDENT_AMBULATORY_CARE_PROVIDER_SITE_OTHER): Payer: Medicare Other | Admitting: Family Medicine

## 2021-07-09 ENCOUNTER — Encounter: Payer: Self-pay | Admitting: Family Medicine

## 2021-07-09 VITALS — BP 128/72 | HR 77 | Temp 98.5°F | Resp 18 | Ht 63.0 in | Wt 265.7 lb

## 2021-07-09 DIAGNOSIS — I10 Essential (primary) hypertension: Secondary | ICD-10-CM

## 2021-07-09 DIAGNOSIS — M170 Bilateral primary osteoarthritis of knee: Secondary | ICD-10-CM | POA: Diagnosis not present

## 2021-07-09 DIAGNOSIS — I428 Other cardiomyopathies: Secondary | ICD-10-CM | POA: Diagnosis not present

## 2021-07-09 DIAGNOSIS — I5022 Chronic systolic (congestive) heart failure: Secondary | ICD-10-CM

## 2021-07-09 DIAGNOSIS — J410 Simple chronic bronchitis: Secondary | ICD-10-CM | POA: Diagnosis not present

## 2021-07-09 DIAGNOSIS — I272 Pulmonary hypertension, unspecified: Secondary | ICD-10-CM

## 2021-07-09 DIAGNOSIS — I482 Chronic atrial fibrillation, unspecified: Secondary | ICD-10-CM | POA: Diagnosis not present

## 2021-07-09 DIAGNOSIS — E785 Hyperlipidemia, unspecified: Secondary | ICD-10-CM | POA: Diagnosis not present

## 2021-07-09 DIAGNOSIS — Z8541 Personal history of malignant neoplasm of cervix uteri: Secondary | ICD-10-CM

## 2021-07-09 DIAGNOSIS — I700 Atherosclerosis of aorta: Secondary | ICD-10-CM

## 2021-07-09 DIAGNOSIS — Z23 Encounter for immunization: Secondary | ICD-10-CM

## 2021-07-21 ENCOUNTER — Other Ambulatory Visit: Payer: Self-pay | Admitting: Cardiovascular Disease

## 2021-07-21 DIAGNOSIS — I700 Atherosclerosis of aorta: Secondary | ICD-10-CM

## 2021-07-21 DIAGNOSIS — E785 Hyperlipidemia, unspecified: Secondary | ICD-10-CM

## 2021-09-02 ENCOUNTER — Other Ambulatory Visit: Payer: Self-pay | Admitting: Cardiovascular Disease

## 2021-09-11 ENCOUNTER — Other Ambulatory Visit: Payer: Self-pay | Admitting: Family

## 2021-10-18 IMAGING — CT CT CHEST NODULE FOLLOW UP LOW DOSE W/O CM
2 of 4 series · 15 of 36 positions shown, 18 images · non-contrast
Comparison: Chest CT 03/09/2017.

CLINICAL DATA: 78-year-old female with history of shortness of
breath on exertion. Pulmonary nodule.

EXAM:
CT CHEST WITHOUT CONTRAST
TECHNIQUE: Multidetector CT imaging of the chest was performed following the
standard protocol without IV contrast.

[Series 2: chest 2.00 · axial · 0.70mm/px · z∈[-1211,-939]mm · 12 of 162 slices shown, 15 images]
[im 13/162  mediastinal]
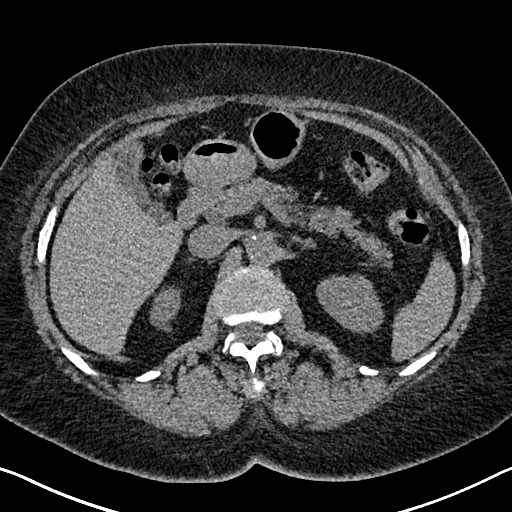
[im 13/162  lung]
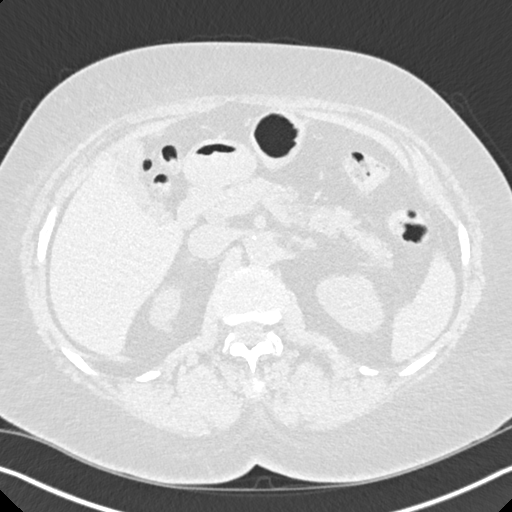
[im 25/162  lung]
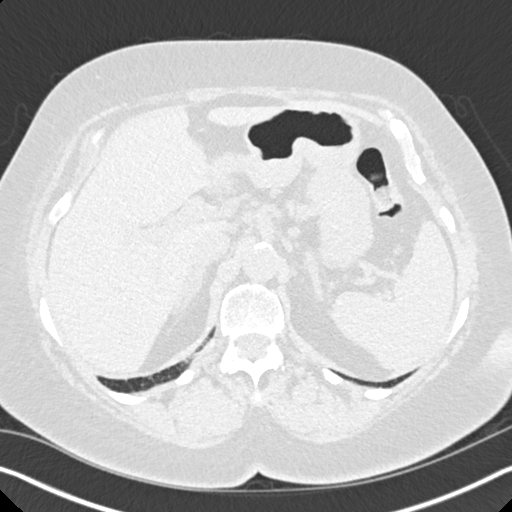
[im 38/162  lung]
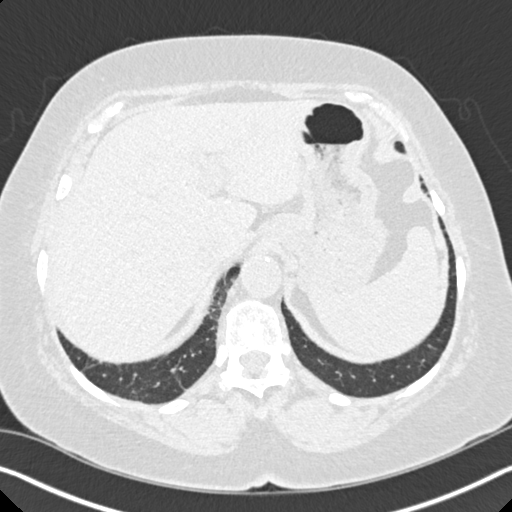
[im 50/162  lung]
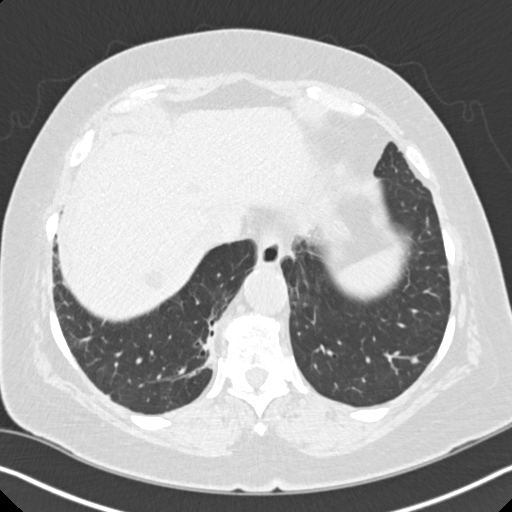
[im 62/162  mediastinal]
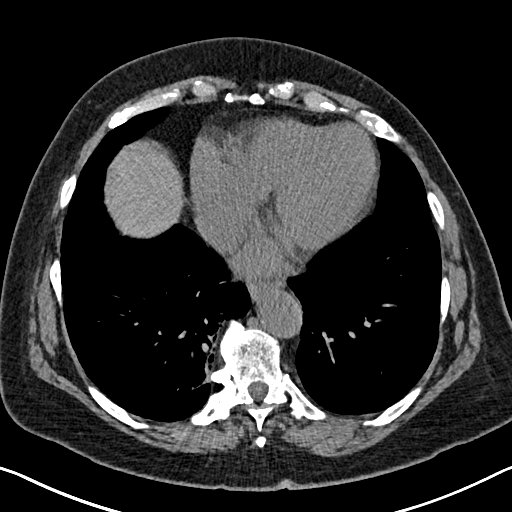
[im 62/162  lung]
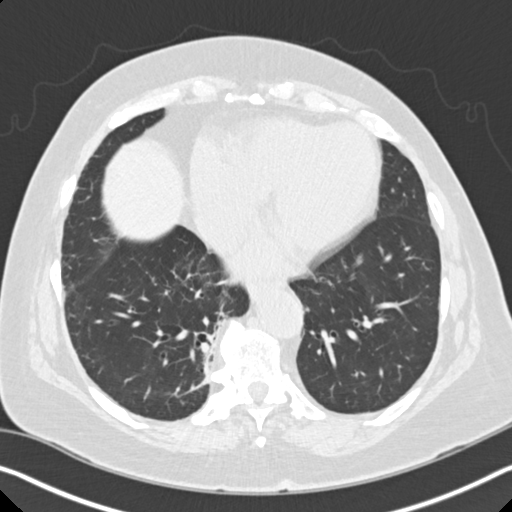
[im 75/162  lung]
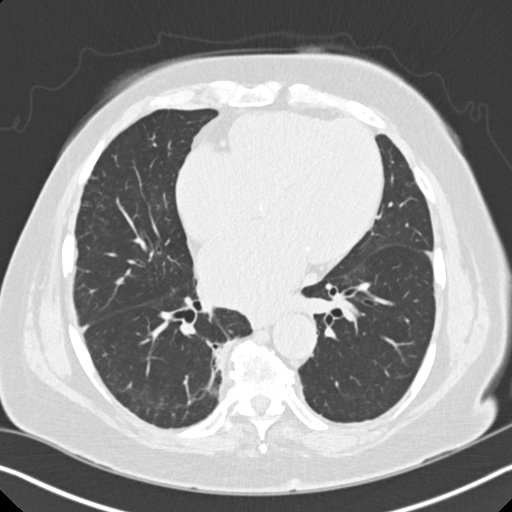
[im 87/162  lung]
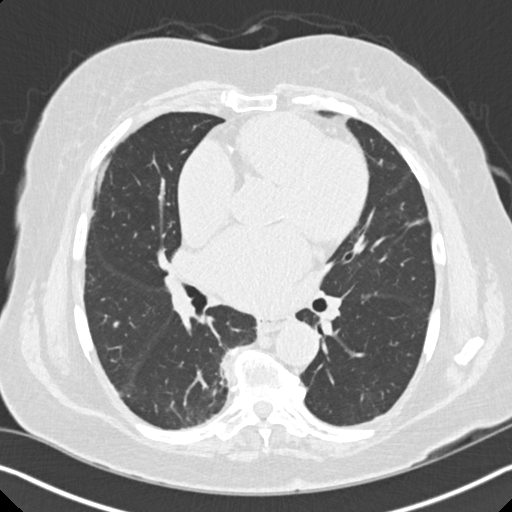
[im 100/162  lung]
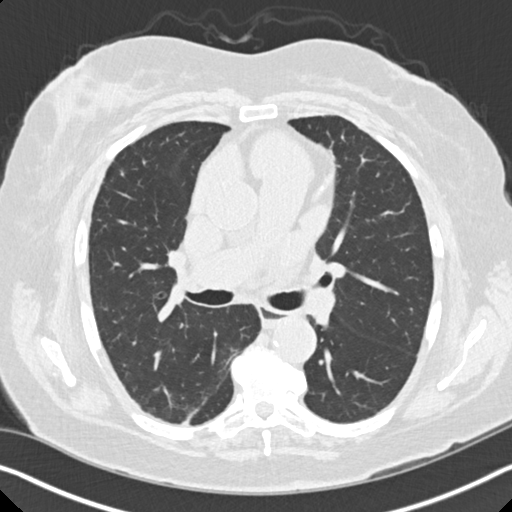
[im 112/162  mediastinal]
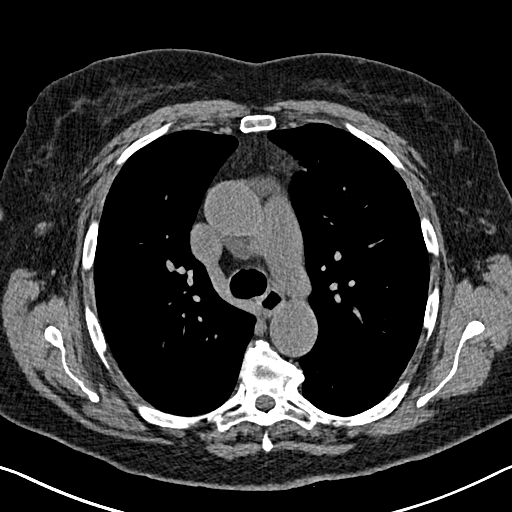
[im 112/162  lung]
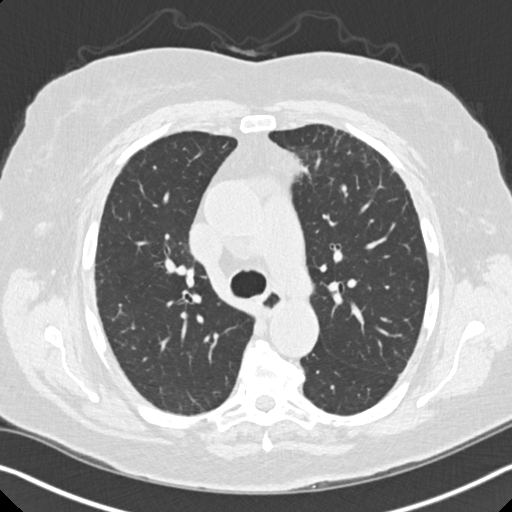
[im 124/162  lung]
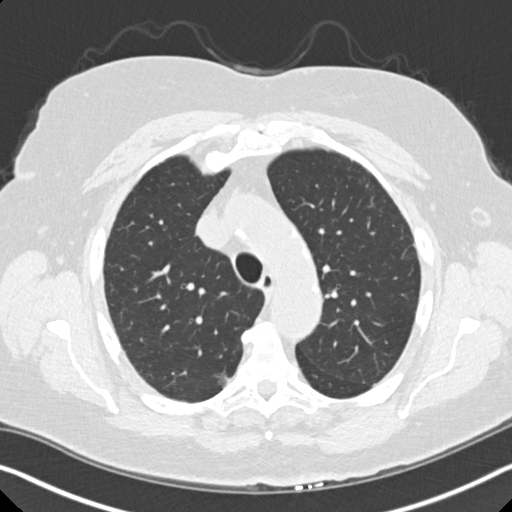
[im 137/162  lung]
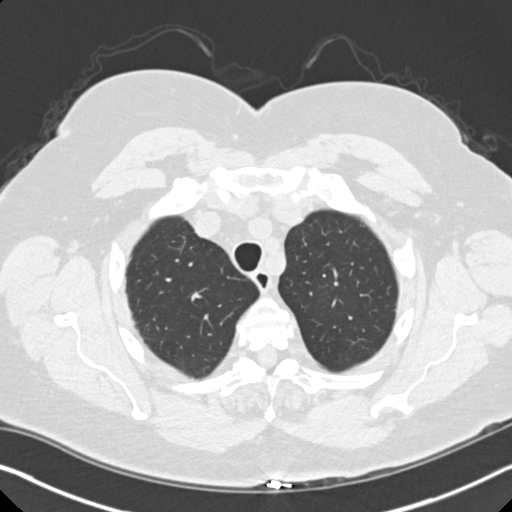
[im 149/162  lung]
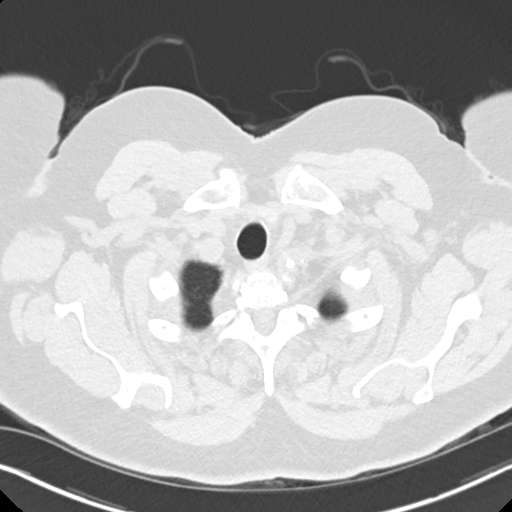

[Series 5: coronals chest 2.00 cor · coronal · 0.63mm/px · 3 of 161 slices shown]
[im 33/161  lung]
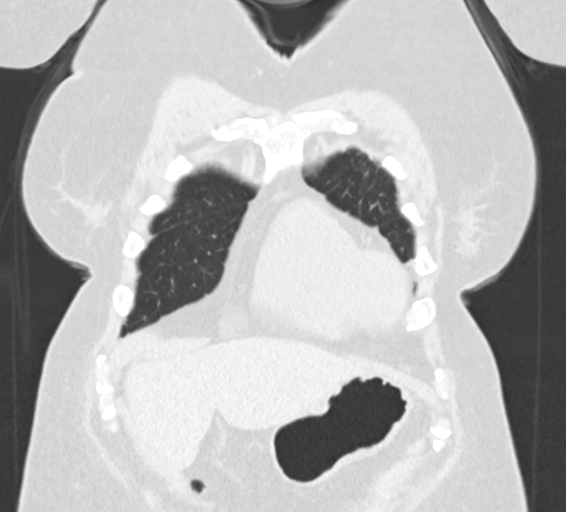
[im 65/161  lung]
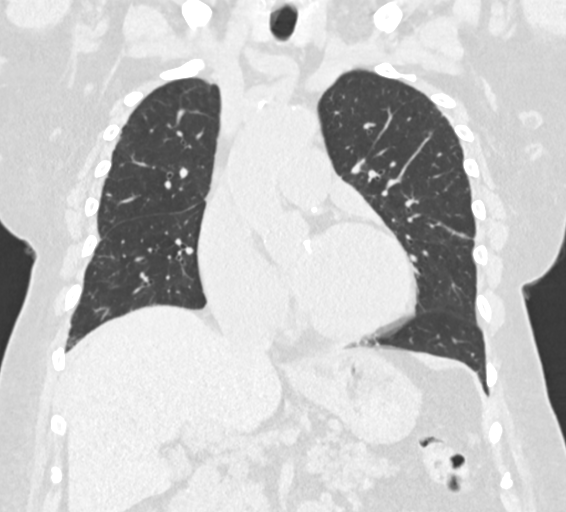
[im 97/161  lung]
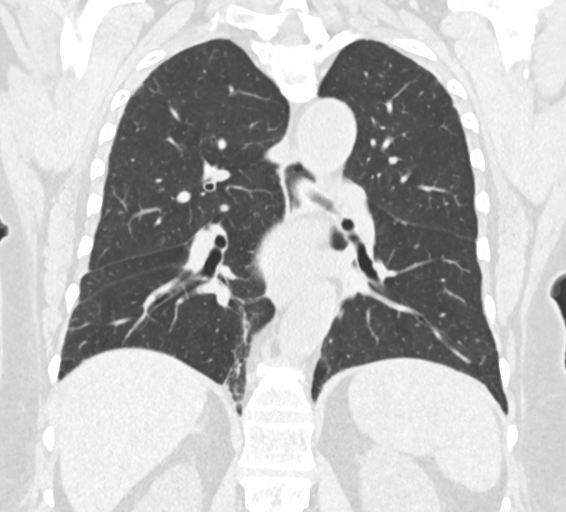

[15 of 36 positions shown; findings below may reference images not displayed]

FINDINGS: Cardiovascular: Heart size is enlarged with left atrial dilatation.
There is no significant pericardial fluid, thickening or pericardial
calcification. There is aortic atherosclerosis, as well as
atherosclerosis of the great vessels of the mediastinum and the
coronary arteries, including calcified atherosclerotic plaque in the
left main, left anterior descending, left circumflex and right
coronary arteries. Mild calcifications of the aortic valve.

Mediastinum/Nodes: No pathologically enlarged mediastinal or hilar
lymph nodes. Please note that accurate exclusion of hilar adenopathy
is limited on noncontrast CT scans. Esophagus is unremarkable in
appearance. No axillary lymphadenopathy.

Lungs/Pleura: 5 mm left lower lobe pulmonary nodule (axial image 112
of series 3), unchanged. A few other tiny calcified granulomas are
noted. No other larger more suspicious appearing pulmonary nodules
or masses are noted. No acute consolidative airspace disease. No
pleural effusions. Mild chronic scarring in the medial aspect of the
right lower lobe adjacent to several prominent marginal osteophytes,
similar to the prior study.

Upper Abdomen: Aortic atherosclerosis. Multiple low-attenuation
lesions noted within the liver, incompletely characterized on
today's non-contrast CT examination, but statistically likely to
represent cysts, largest of which is in segment 4A measuring 1.5 cm
in diameter.

Musculoskeletal: There are no aggressive appearing lytic or blastic
lesions noted in the visualized portions of the skeleton.
IMPRESSION: 1. No acute findings in the thorax to account for the patient's
symptoms.
2. Previously noted left lower lobe pulmonary nodule is stable in
size compared to 2641, considered definitively benign.
3. Cardiomegaly with left atrial dilatation.
4. Aortic atherosclerosis, in addition to left main and 3 vessel
coronary artery disease. Assessment for potential risk factor
modification, dietary therapy or pharmacologic therapy may be
warranted, if clinically indicated.
5. There are mild calcifications of the aortic valve.
Echocardiographic correlation for evaluation of potential valvular
dysfunction may be warranted if clinically indicated.

Aortic Atherosclerosis ([id number]).

## 2021-12-03 ENCOUNTER — Ambulatory Visit: Payer: Medicare Other | Admitting: Cardiovascular Disease

## 2021-12-03 ENCOUNTER — Other Ambulatory Visit: Payer: Self-pay

## 2021-12-03 ENCOUNTER — Encounter: Payer: Self-pay | Admitting: Cardiovascular Disease

## 2021-12-03 DIAGNOSIS — I5022 Chronic systolic (congestive) heart failure: Secondary | ICD-10-CM

## 2021-12-03 DIAGNOSIS — E785 Hyperlipidemia, unspecified: Secondary | ICD-10-CM | POA: Diagnosis not present

## 2021-12-03 DIAGNOSIS — I10 Essential (primary) hypertension: Secondary | ICD-10-CM

## 2021-12-03 DIAGNOSIS — I482 Chronic atrial fibrillation, unspecified: Secondary | ICD-10-CM | POA: Diagnosis not present

## 2021-12-03 NOTE — Patient Instructions (Signed)
Medication Instructions:  ?Your physician recommends that you continue on your current medications as directed. Please refer to the Current Medication list given to you today. ? ?*If you need a refill on your cardiac medications before your next appointment, please call your pharmacy* ? ? ?Lab Work: ?Cbc, Cmp, Lipid, Bnp today ? ?If you have labs (blood work) drawn today and your tests are completely normal, you will receive your results only by: ?MyChart Message (if you have MyChart) OR ?A paper copy in the mail ?If you have any lab test that is abnormal or we need to change your treatment, we will call you to review the results. ? ? ?Testing/Procedures: ?None ordered ? ? ?Follow-Up: ?At [facility], you and your health needs are our priority.  As part of our continuing mission to provide you with exceptional heart care, we have created designated Provider Care Teams.  These Care Teams include your primary Cardiologist (physician) and Advanced Practice Providers (APPs -  Physician Assistants and Nurse Practitioners) who all work together to provide you with the care you need, when you need it. ? ?We recommend signing up for the patient portal called "MyChart".  Sign up information is provided on this After Visit Summary.  MyChart is used to connect with patients for Virtual Visits (Telemedicine).  Patients are able to view lab/test results, encounter notes, upcoming appointments, etc.  Non-urgent messages can be sent to your provider as well.   ?To learn more about what you can do with MyChart, go to [email or web address].   ? ?Your next appointment:   ?6 month(s) ? ?The format for your next appointment:   ?In Person ? ?Provider:   ?You may see Kathlyn [place], MD or one of the following Advanced Practice Providers on your designated Care Team:   ?Murray Hodgkins, NP ?Christell Faith, PA-C ?Cadence Kathlen Mody, PA-C ? ? ?Other Instructions ?N/A ? ?

## 2021-12-03 NOTE — Progress Notes (Signed)
?  ?Cardiology Office Note ? ? ?Date:  12/03/2021  ? ?ID:  Kimberly Henry, DOB 1941/09/04, MRN [id number] ? ?PCP:  Steele Sizer, MD  ?Cardiologist:   Kathlyn Sacramento, MD  ? ?Chief Complaint  ?Patient presents with  ? Other  ?  6 month f/u no complaints today. Meds reviewed verbally with pt.  ? ? ? ?  ?History of Present Illness: ?Kimberly Henry is a 81 y.o. female who is here today for follow-up visit regarding chronic systolic heart failure due to nonischemic cardiomyopathy and chronic atrial fibrillation. ?She has known history of essential hypertension, obesity, COPD, hyperlipidemia and osteoarthritis.She is a previous smoker but quit many years ago. ?She was diagnosed with systolic heart failure in August of 2018.   She was noted to have left bundle branch block.  Cardiac catheterization at that time showed mild nonobstructive coronary artery disease with an EF of 25-30%. Right heart catheterization showed severely elevated filling pressures, severely reduced cardiac output and severe pulmonary hypertension. ?She was treated with medical therapy and has done well with medications.  She declined CRT at some point given improvement in her symptoms.  Most recent echocardiogram in May 2021 showed an EF of 50 to 55%.  Left atrium was severely dilated.  She was diagnosed with atrial fibrillation around the same time and I felt that the chance of maintaining sinus rhythm was going to be low without an antiarrhythmic medication.  Given lack of symptoms related to atrial fibrillation, I continued rate control and she has been doing well.   ? ?I discontinued digoxin during last visit.  She has been doing well with no recent chest pain.  She does complain of exertional dyspnea.  She has not used Lasix in a while.  She gains 15 pounds over the last 6 months but she has no significant lower extremity edema.  She does admit to poor eating habit and she does not do much physical activities especially in the setting of severe  left knee arthritis. ? ? ?Past Medical History:  ?Diagnosis Date  ? Acid phosphatase elevated   ? Aortic atherosclerosis ([place])   ? Atrial fibrillation ([place])   ? AV block, 1st degree   ? COPD (chronic obstructive pulmonary disease) ([place])   ? HFrEF (heart failure with reduced ejection fraction) ([place])   ? a. 04/2017 Echo: EF 30-35%, antsept HK, mild MR, mod dil LA, mildly dil RA, mod TR, mildly to mod elev PASP; b. 08/2017 Echo: EF 30-35%, diff HK, antsept HK. Gr1 DD. Mild to mod MR. PASP nl.  ? Hx of cervical malignancy   ? Hyperlipidemia   ? Hypertension   ? LBBB (left bundle branch block)   ? Metabolic syndrome   ? NICM (nonischemic cardiomyopathy) ([place])   ? a. 04/2017 Echo: EF 30-35%; b. 04/2017 Cath: nonobs dzs; c. 08/2017 Echo: EF 30-35%, diff HK, antsept HK. Gr1 DD. Mild to mod MR. PASP nl.  ? Non-obstructive CAD (coronary artery disease)   ? a. 04/2017 Cath: mild nonobs CAD. EF 25-35%. CO 3.14, CI 1.38. Sev PAH [61/34(47].  ? Obesity, Class III, BMI 40-49.9 (morbid obesity) ([place])   ? Osteoarthritis of both knees   ? PAH (pulmonary arterial hypertension) with portal hypertension ([place])   ? a. 04/2017 Right Heart Cath: Sev PAH 61/34(47); b. 08/2017 Echo: nl PASP.  ? ? ?Past Surgical History:  ?Procedure Laterality Date  ? ABDOMINAL HYSTERECTOMY  1996  ? CARDIAC CATHETERIZATION  2018  ? d/t CHF  ?  CATARACT EXTRACTION Left 2008  ? CATARACT EXTRACTION W/PHACO Right 03/11/2021  ? Procedure: CATARACT EXTRACTION PHACO AND INTRAOCULAR LENS PLACEMENT ([place]) RIGHT;  Surgeon: Leandrew Koyanagi, MD;  Location: [facility];  Service: Ophthalmology;  Laterality: Right;  7.70 ?01:31.1  ? JOINT REPLACEMENT Right 06/2020  ? KNEE CLOSED REDUCTION Right 10/29/2020  ? Procedure: CLOSED MANIPULATION KNEE;  Surgeon: Lovell Sheehan, MD;  Location: [facility];  Service: Orthopedics;  Laterality: Right;  ? RIGHT/LEFT HEART CATH AND CORONARY ANGIOGRAPHY N/A 04/21/2017  ? Procedure: RIGHT/LEFT HEART CATH AND CORONARY ANGIOGRAPHY;  Surgeon:  Wellington Hampshire, MD;  Location: [facility];  Service: Cardiovascular;  Laterality: N/A;  ? TOTAL KNEE ARTHROPLASTY Right 07/02/2020  ? Procedure: TOTAL KNEE ARTHROPLASTY;  Surgeon: Lovell Sheehan, MD;  Location: [facility];  Service: Orthopedics;  Laterality: Right;  ? ? ? ?Current Outpatient Medications  ?Medication Sig Dispense Refill  ? acetaminophen (TYLENOL) 500 MG tablet Take 1,000 mg by mouth every 6 (six) hours as needed for pain.     ? apixaban (ELIQUIS) 5 MG TABS tablet Take 1 tablet (5 mg total) by mouth 2 (two) times daily. 180 tablet 1  ? carvedilol (COREG) 6.25 MG tablet TAKE 1 TABLET BY MOUTH  TWICE DAILY WITH A MEAL 180 tablet 0  ? ENTRESTO 97-103 MG TAKE 1 TABLET BY MOUTH  TWICE DAILY 180 tablet 0  ? furosemide (LASIX) 40 MG tablet Take 1 tablet (40 mg total) by mouth daily. Needs appt for further refills 90 tablet 0  ? levocetirizine (XYZAL) 5 MG tablet TAKE 1 TABLET BY MOUTH IN  THE EVENING 90 tablet 1  ? Multiple Vitamin (MULTIVITAMIN WITH MINERALS) TABS tablet Take 1 tablet by mouth daily.    ? potassium chloride SA (KLOR-CON) 20 MEQ tablet TAKE 1 TABLET BY MOUTH  DAILY 90 tablet 3  ? rosuvastatin (CRESTOR) 40 MG tablet TAKE 1 TABLET BY MOUTH  DAILY 90 tablet 1  ? spironolactone (ALDACTONE) 25 MG tablet TAKE 1 TABLET BY MOUTH  DAILY 90 tablet 0  ? ?No current facility-administered medications for this visit.  ? ? ?Allergies:   Patient has no known allergies.  ? ? ?Social History:  The patient  reports that she quit smoking about 35 years ago. Her smoking use included cigarettes. She started smoking about 46 years ago. She has never used smokeless tobacco. She reports that she does not drink alcohol and does not use drugs.  ? ?Family History:  The patient's family history includes Congestive Heart Failure in her father and mother; Hypertension in her mother; Prostate cancer in her son.  ? ? ?ROS:  Please see the history of present illness.   Otherwise, review of systems are positive  for none.   All other systems are reviewed and negative.  ? ? ?PHYSICAL EXAM: ?VS:  BP 120/70 (BP Location: Left Arm, Patient Position: Sitting, Cuff Size: Large)   Pulse 67   Ht '5\' 3"'$  (1.6 m)   Wt 279 lb 6 oz (126.7 kg)   SpO2 99%   BMI 49.49 kg/m?  , BMI Body mass index is 49.49 kg/m?. ?GEN: Well nourished, well developed, in no acute distress  ?HEENT: normal  ?Neck: no carotid bruits, or masses. No significant JVD. ?Cardiac: Irregularly irregular; no murmurs, rubs, or gallops, no edema ?Respiratory: Clear to auscultation bilaterally, normal work of breathing ?GI: soft, nontender, nondistended, + BS ?MS: no deformity or atrophy  ?Skin: warm and dry, no rash ?Neuro:  Strength and  sensation are intact ?Psych: euthymic mood, full affect ? ? ?EKG:  EKG is ordered today. ?EKG showed atrial fibrillation with ventricular rate of 67 bpm.  Left bundle branch block. ? ? ?Recent Labs: ?01/07/2021: ALT 12; BUN 16; Creat 0.88; Hemoglobin 12.5; Platelets 202; Potassium 4.3; Sodium 141; TSH 3.55  ? ? ?Lipid Panel ?   ?Component Value Date/Time  ? CHOL 141 01/07/2021 0000  ? TRIG 101 01/07/2021 0000  ? HDL 48 (L) 01/07/2021 0000  ? CHOLHDL 2.9 01/07/2021 0000  ? VLDL 22 03/02/2017 1242  ? [place] 75 01/07/2021 0000  ? ?  ? ?Wt Readings from Last 3 Encounters:  ?12/03/21 279 lb 6 oz (126.7 kg)  ?07/09/21 265 lb 11.2 oz (120.5 kg)  ?06/04/21 264 lb 4 oz (119.9 kg)  ?  ? ? ? ?   ? View : No data to display.  ?  ?  ?  ? ? ? ? ?ASSESSMENT AND PLAN: ? ?1.  Chronic systolic heart failure: Due to nonischemic cardiomyopathy.  Chronically in [place] class II but she reports some increased shortness of breath lately.  Even though she is not taking Lasix on a regular basis, she does not appear to be significantly volume overloaded.  She did gain weight over the last 6 months but that seems to be due to poor diet choices.  I am going to check BNP.  Most recent echocardiogram showed improvement in ejection fraction to 50  to 55%.   ?Continue treatment with carvedilol, Entresto and spironolactone.  I requested basic metabolic profile today. ? ?2. Essential hypertension: Blood pressure is well controlled on current medicatio

## 2021-12-04 ENCOUNTER — Telehealth: Payer: Self-pay

## 2021-12-04 DIAGNOSIS — I5022 Chronic systolic (congestive) heart failure: Secondary | ICD-10-CM

## 2021-12-04 LAB — LIPID PANEL
Chol/HDL Ratio: 3 ratio (ref 0.0–4.4)
Cholesterol, Total: 139 mg/dL (ref 100–199)
HDL: 47 mg/dL (ref >39)
LDL Chol Calc (NIH): 77 mg/dL (ref 0–99)
Triglycerides: 79 mg/dL (ref 0–149)
VLDL Cholesterol Cal: 15 mg/dL (ref 5–40)

## 2021-12-04 LAB — BRAIN NATRIURETIC PEPTIDE: BNP: 187.8 pg/mL — ABNORMAL HIGH (ref 0.0–100.0)

## 2021-12-04 LAB — CBC WITH DIFFERENTIAL/PLATELET
Basophils Absolute: 0 10*3/uL (ref 0.0–0.2)
Basos: 1 %
EOS (ABSOLUTE): 0.1 10*3/uL (ref 0.0–0.4)
Eos: 2 %
Hematocrit: 35.9 % (ref 34.0–46.6)
Hemoglobin: 12.1 g/dL (ref 11.1–15.9)
Immature Grans (Abs): 0 10*3/uL (ref 0.0–0.1)
Immature Granulocytes: 0 %
Lymphocytes Absolute: 1.3 10*3/uL (ref 0.7–3.1)
Lymphs: 41 %
MCH: 31.3 pg (ref 26.6–33.0)
MCHC: 33.7 g/dL (ref 31.5–35.7)
MCV: 93 fL (ref 79–97)
Monocytes Absolute: 0.2 10*3/uL (ref 0.1–0.9)
Monocytes: 6 %
Neutrophils Absolute: 1.7 10*3/uL (ref 1.4–7.0)
Neutrophils: 50 %
Platelets: 260 10*3/uL (ref 150–450)
RBC: 3.86 x10E6/uL (ref 3.77–5.28)
RDW: 12.5 % (ref 11.7–15.4)
WBC: 3.3 10*3/uL — ABNORMAL LOW (ref 3.4–10.8)

## 2021-12-04 LAB — COMPREHENSIVE METABOLIC PANEL
ALT: 16 IU/L (ref 0–32)
AST: 47 IU/L — ABNORMAL HIGH (ref 0–40)
Albumin/Globulin Ratio: 1.5 (ref 1.2–2.2)
Albumin: 4.2 g/dL (ref 3.6–4.6)
Alkaline Phosphatase: 114 IU/L (ref 44–121)
BUN/Creatinine Ratio: 12 (ref 12–28)
BUN: 11 mg/dL (ref 8–27)
Bilirubin Total: 0.5 mg/dL (ref 0.0–1.2)
CO2: 21 mmol/L (ref 20–29)
Calcium: 9.4 mg/dL (ref 8.7–10.3)
Chloride: 105 mmol/L (ref 96–106)
Creatinine, Ser: 0.89 mg/dL (ref 0.57–1.00)
Globulin, Total: 2.8 g/dL (ref 1.5–4.5)
Glucose: 134 mg/dL — ABNORMAL HIGH (ref 70–99)
Sodium: 142 mmol/L (ref 134–144)
Total Protein: 7 g/dL (ref 6.0–8.5)
eGFR: 65 mL/min/{1.73_m2} (ref >59)

## 2021-12-04 MED ORDER — FUROSEMIDE 20 MG PO TABS: 20.0000 mg | ORAL_TABLET | Freq: Every day | ORAL | Status: DC

## 2021-12-04 NOTE — Telephone Encounter (Signed)
-----   Message from Wellington Hampshire, MD sent at 12/04/2021  3:08 PM EDT ----- ?Inform patient that labs were stable but BNP was mildly elevated suggesting some heart failure.  She was on Lasix before but has not used the medication in a while.  Recommend resuming Lasix at 20 mg daily and recheck basic metabolic profile in 1 week. ? ?

## 2021-12-04 NOTE — Telephone Encounter (Signed)
Patient made aware of lab results and Dr. Tyrell Antonio recommendation with verbalized understanding.Marland Kitchen ?Patient will resume lasix 20 mg daily. Pt sts that she has some on hand and does not need a new prescription at this time. Lab appt for 1 wk bmp scheduled on 12/11/21 @ 9am ? ?

## 2021-12-08 NOTE — Telephone Encounter (Signed)
This encounter was created in error - please disregard.

## 2021-12-08 NOTE — Addendum Note (Signed)
Addended by: Eli Phillips on: 12/08/2021 02:33 PM   Modules accepted: Orders, Level of Service

## 2021-12-10 ENCOUNTER — Telehealth: Payer: Self-pay | Admitting: Cardiovascular Disease

## 2021-12-10 NOTE — Telephone Encounter (Signed)
Patient calling to discuss last lab results ?

## 2021-12-10 NOTE — Telephone Encounter (Signed)
Spoke with the patient. ?Patient would like clarification as to why she needs a repeat bmp. ?Adv the patient that since she has resumed her lasix Dr. Fletcher Anon wants to insure that her kidney function and electrolytes are stable with the addition of lasix. ? ?Patient verbalized understanding and will keep her lab appt as scheduled. ? ?

## 2021-12-11 ENCOUNTER — Other Ambulatory Visit: Payer: Medicare Other

## 2021-12-14 ENCOUNTER — Other Ambulatory Visit (INDEPENDENT_AMBULATORY_CARE_PROVIDER_SITE_OTHER): Payer: Medicare Other

## 2021-12-14 DIAGNOSIS — I5022 Chronic systolic (congestive) heart failure: Secondary | ICD-10-CM

## 2021-12-15 LAB — BASIC METABOLIC PANEL
BUN/Creatinine Ratio: 18 (ref 12–28)
BUN: 21 mg/dL (ref 8–27)
CO2: 19 mmol/L — ABNORMAL LOW (ref 20–29)
Calcium: 10 mg/dL (ref 8.7–10.3)
Chloride: 107 mmol/L — ABNORMAL HIGH (ref 96–106)
Creatinine, Ser: 1.14 mg/dL — ABNORMAL HIGH (ref 0.57–1.00)
Glucose: 110 mg/dL — ABNORMAL HIGH (ref 70–99)
Potassium: 4.9 mmol/L (ref 3.5–5.2)
Sodium: 143 mmol/L (ref 134–144)
eGFR: 48 mL/min/{1.73_m2} — ABNORMAL LOW (ref >59)

## 2021-12-17 ENCOUNTER — Telehealth: Payer: Self-pay

## 2021-12-17 NOTE — Telephone Encounter (Signed)
-----   Message from Wellington Hampshire, MD sent at 12/17/2021 12:59 PM EDT ----- ?Inform patient that labs showed mildly abnormal kidney function with slight dehydration.  Recommend stopping furosemide and watch for signs of fluid overload ?

## 2021-12-17 NOTE — Telephone Encounter (Signed)
Patient made aware of lab results and Dr. Tyrell Antonio recommendation with verbalized understanding. ?

## 2022-01-08 ENCOUNTER — Ambulatory Visit: Payer: Medicare Other | Admitting: Family Medicine

## 2022-01-22 ENCOUNTER — Other Ambulatory Visit: Payer: Self-pay | Admitting: Cardiovascular Disease

## 2022-01-22 DIAGNOSIS — I4891 Unspecified atrial fibrillation: Secondary | ICD-10-CM

## 2022-01-22 NOTE — Telephone Encounter (Signed)
Prescription refill request for Eliquis received. ?Indication:Afib ?Last office visit:3/23 ?Scr:1.1 ?Age: 81 ?Weight:126.7 kg ? ?Prescription refilled ? ?

## 2022-01-29 ENCOUNTER — Other Ambulatory Visit: Payer: Self-pay | Admitting: Cardiovascular Disease

## 2022-01-29 DIAGNOSIS — I700 Atherosclerosis of aorta: Secondary | ICD-10-CM

## 2022-01-29 DIAGNOSIS — E785 Hyperlipidemia, unspecified: Secondary | ICD-10-CM

## 2022-02-02 ENCOUNTER — Other Ambulatory Visit: Payer: Self-pay | Admitting: Family Medicine

## 2022-02-02 ENCOUNTER — Other Ambulatory Visit: Payer: Self-pay | Admitting: *Deleted

## 2022-02-02 DIAGNOSIS — J302 Other seasonal allergic rhinitis: Secondary | ICD-10-CM

## 2022-02-02 MED ORDER — POTASSIUM CHLORIDE CRYS ER 20 MEQ PO TBCR: 20.0000 meq | EXTENDED_RELEASE_TABLET | Freq: Every day | ORAL | 1 refills | Status: DC

## 2022-02-25 NOTE — Progress Notes (Signed)
Name: Kimberly Henry   MRN: [id number]    DOB: 08-24-1941   Date:02/26/2022       Progress Note  Subjective  Chief Complaint  Follow Up  HPI  CHF: diagnosed in 04/2017, secondary to non ischemic cardiomyopathy, no longer going to CHF clinic but saw Dr. Fletcher Anon 12/2021  She is on beta-blocker, spironolactone, Entresto but she has been off lasix xine April due to mild dehydration and decrease in GFR, she has been  off digoxin since 09/22 . She gained 3 lbs since her visit with Dr. Fletcher Anon in March but gained almost 20 lbs since October 2023. She denies any current swelling or orthopnea, but has sob with moderate activity. Denies PND.Cath done in 2018 showed severe pulmonary hypertension   Atrial Fibrilation: on Eliquis and is rate controlled with carvedilol, no side effects, denies blood in stools or gum. Denies easy bruising   Study Conclusions / ECHO 01/2020  1. Left ventricular ejection fraction, by estimation, is 50 to 55%. Left ventricular ejection fraction by PLAX is 50 %. The left ventricle has low normal function. The left ventricle has no regional wall motion abnormalities. Left ventricular diastolic parameters were normal. 2. Right ventricular systolic function is normal. The right ventricular size is normal. There is mildly elevated pulmonary artery systolic pressure. 3. Left atrial size was mildly dilated. 4. The mitral valve is grossly normal. Trivial mitral valve regurgitation. 5. The aortic valve is grossly normal. Aortic valve regurgitation is not visualized. No aortic stenosis is present.  Cardiac cath 04/21/2017  1. Mild nonobstructive coronary artery disease. 2. Moderately to severely reduced LV systolic function with an EF of 25-35%. 3. Right heart catheterization showed moderately elevated filling pressures, severe pulmonary hypertension and severely reduced cardiac output at 3.14 with a cardiac index of 1.38. Pulmonary Wedge pressure was 27 mmHg and PA pressure was  61/34 with a mean of 47 mmHg   HTN: she has been taking medication daily, no chest pain or palpitation, denies dizziness. She has SOB with activity but stable    OA: she has a long history of OA of both knees. She had total right  knee replacement done by Dr. Harlow Mares 06/2020 and had to go back for intra operative manipulation 10/2020, she states today her pain level is zero, but still has some stiffness, she had PT. She still using a cane but mostly to help with pain on left lower leg, usually on left ankle. She has been wearing a soft  brace for stability    Perennial allergic rhinitis: she states symptoms controlled with medication denies rhinorrhea, nasal congestion or sneezing.  Unchanged   Chronic bronchitis: she smoked about 1 pack a day for about 20 years but quit many years ago, she states she has been doing well, no cough, wheezing has SOB with moderate activity only.  Morbid Obesity: weight is going up, she up another 17 lbs since last Fall, this is the heaviest she has ever been. She states not as active and she states she likes to eat, but will try to lose weight by modifying her diet     Atherosclerosis of aorta: on statin therapy and bp is controlled, LDL goal is below 70  Dyslipidemia: she is now on Rosuvastatin 40 mg and last LDL was 77, discussed adding Zetia but she wants to try resuming a healthier diet   Patient Active Problem List   Diagnosis Date Noted   Stage 3a chronic kidney disease ([place]) 02/26/2022   Arthritis  of left knee 06/09/2021   S/P TKR (total knee replacement) using cement, right 07/02/2020   Simple chronic bronchitis (HCC)    CAD (coronary artery disease)    Chronic a-fib (HCC)    Lung nodule seen on imaging study 01/09/2020   NICM (nonischemic cardiomyopathy) ([place]) 01/09/2020   Morbid obesity with BMI of 40.0-44.9, adult ([place]) 07/62/2633   Chronic systolic heart failure ([place]) 04/28/2017   Aortic atherosclerosis ([place]) 03/10/2017   Perennial allergic  rhinitis with seasonal variation 05/30/2015   1st degree AV block 35/45/6256   Metabolic syndrome 38/93/7342   History of cervical cancer 03/03/2015   Hypertension, benign 03/03/2015   Osteoarthritis, knee 03/03/2015   Dyslipidemia 03/03/2015    Past Surgical History:  Procedure Laterality Date   [place]  2018   d/t CHF   CATARACT EXTRACTION Left 2008   CATARACT EXTRACTION W/PHACO Right 03/11/2021   Procedure: CATARACT EXTRACTION PHACO AND INTRAOCULAR LENS PLACEMENT ([place]) RIGHT;  Surgeon: Leandrew Koyanagi, MD;  Location: [facility];  Service: Ophthalmology;  Laterality: Right;  7.70 01:31.1   JOINT REPLACEMENT Right 06/2020   KNEE CLOSED REDUCTION Right 10/29/2020   Procedure: CLOSED MANIPULATION KNEE;  Surgeon: Lovell Sheehan, MD;  Location: [facility];  Service: Orthopedics;  Laterality: Right;   RIGHT/LEFT HEART CATH AND CORONARY ANGIOGRAPHY N/A 04/21/2017   Procedure: RIGHT/LEFT HEART CATH AND CORONARY ANGIOGRAPHY;  Surgeon: Wellington Hampshire, MD;  Location: [facility];  Service: Cardiovascular;  Laterality: N/A;   TOTAL KNEE ARTHROPLASTY Right 07/02/2020   Procedure: TOTAL KNEE ARTHROPLASTY;  Surgeon: Lovell Sheehan, MD;  Location: [facility];  Service: Orthopedics;  Laterality: Right;    Family History  Problem Relation Age of Onset   Hypertension Mother    Congestive Heart Failure Mother    Congestive Heart Failure Father    Prostate cancer Son     Social History   Tobacco Use   Smoking status: Former    Years: 10.00    Types: Cigarettes    Start date: 09/14/1975    Quit date: 06/22/1986    Years since quitting: 35.7   Smokeless tobacco: Never  Substance Use Topics   Alcohol use: No    Alcohol/week: 0.0 standard drinks of alcohol     Current Outpatient Medications:    acetaminophen (TYLENOL) 500 MG tablet, Take 1,000 mg by mouth every 6 (six) hours as needed for pain. , Disp: , Rfl:    carvedilol  (COREG) 6.25 MG tablet, TAKE 1 TABLET BY MOUTH TWICE  DAILY WITH MEALS, Disp: 180 tablet, Rfl: 3   dapagliflozin propanediol (FARXIGA) 10 MG TABS tablet, Take 1 tablet (10 mg total) by mouth daily before breakfast., Disp: 90 tablet, Rfl: 1   ELIQUIS 5 MG TABS tablet, TAKE 1 TABLET BY MOUTH  TWICE DAILY, Disp: 180 tablet, Rfl: 3   ENTRESTO 97-103 MG, TAKE 1 TABLET BY MOUTH TWICE  DAILY, Disp: 180 tablet, Rfl: 3   levocetirizine (XYZAL) 5 MG tablet, TAKE 1 TABLET BY MOUTH IN  THE EVENING, Disp: 90 tablet, Rfl: 1   Multiple Vitamin (MULTIVITAMIN WITH MINERALS) TABS tablet, Take 1 tablet by mouth daily., Disp: , Rfl:    potassium chloride SA (KLOR-CON M) 20 MEQ tablet, Take 1 tablet (20 mEq total) by mouth daily., Disp: 90 tablet, Rfl: 1   rosuvastatin (CRESTOR) 40 MG tablet, TAKE 1 TABLET BY MOUTH DAILY, Disp: 100 tablet, Rfl: 0   spironolactone (ALDACTONE) 25 MG tablet,  TAKE 1 TABLET BY MOUTH DAILY, Disp: 90 tablet, Rfl: 3  No Known Allergies  I personally reviewed active problem list, medication list, allergies, family history, social history, health maintenance with the patient/caregiver today.   ROS  Ten systems reviewed and is negative except as mentioned in HPI   Objective  Vitals:   02/26/22 0853  BP: 130/82  Pulse: 96  Resp: 16  SpO2: 100%  Weight: 282 lb (127.9 kg)  Height: '5\' 6"'  (1.676 m)    Body mass index is 45.52 kg/m.  Physical Exam  Constitutional: Patient appears well-developed and well-nourished. Obese  No distress.  HEENT: head atraumatic, normocephalic, pupils equal and reactive to light,, neck supple Cardiovascular: Normal rate, regular rhythm and normal heart sounds.  No murmur heard. Trace  BLE edema. Pulmonary/Chest: Effort normal and breath sounds normal. No respiratory distress. Abdominal: Soft.  There is no tenderness. Psychiatric: Patient has a normal mood and affect. behavior is normal. Judgment and thought content normal.  Muscular skeletal: using a  cane, slow gait   Recent Results (from the past 2160 hour(s))  Comp Met (CMET)     Status: Abnormal   Collection Time: 12/03/21  8:38 AM  Result Value Ref Range   Glucose 134 (H) 70 - 99 mg/dL   BUN 11 8 - 27 mg/dL   Creatinine, Ser 0.89 0.57 - 1.00 mg/dL   eGFR 65 >59 mL/min/1.73   BUN/Creatinine Ratio 12 12 - 28   Sodium 142 134 - 144 mmol/L   Potassium CANCELED mmol/L    Comment: Test not performed. Specimen is hemolyzed. Unable to obtain valid results. **Verified by repeat analysis**  Result canceled by the ancillary.    Chloride 105 96 - 106 mmol/L   CO2 21 20 - 29 mmol/L   Calcium 9.4 8.7 - 10.3 mg/dL   Total Protein 7.0 6.0 - 8.5 g/dL   Albumin 4.2 3.6 - 4.6 g/dL   Globulin, Total 2.8 1.5 - 4.5 g/dL   Albumin/Globulin Ratio 1.5 1.2 - 2.2   Bilirubin Total 0.5 0.0 - 1.2 mg/dL   Alkaline Phosphatase 114 44 - 121 IU/L   AST 47 (H) 0 - 40 IU/L    Comment: Specimen received hemolyzed. Value may be increased by hemolysis. Clinical correlation indicated.    ALT 16 0 - 32 IU/L  CBC w/Diff     Status: Abnormal   Collection Time: 12/03/21  8:38 AM  Result Value Ref Range   WBC 3.3 (L) 3.4 - 10.8 x10E3/uL   RBC 3.86 3.77 - 5.28 x10E6/uL   Hemoglobin 12.1 11.1 - 15.9 g/dL   Hematocrit 35.9 34.0 - 46.6 %   MCV 93 79 - 97 fL   MCH 31.3 26.6 - 33.0 pg   MCHC 33.7 31.5 - 35.7 g/dL   RDW 12.5 11.7 - 15.4 %   Platelets 260 150 - 450 x10E3/uL   Neutrophils 50 Not Estab. %   Lymphs 41 Not Estab. %   Monocytes 6 Not Estab. %   Eos 2 Not Estab. %   Basos 1 Not Estab. %   Neutrophils Absolute 1.7 1.4 - 7.0 x10E3/uL   Lymphocytes Absolute 1.3 0.7 - 3.1 x10E3/uL   Monocytes Absolute 0.2 0.1 - 0.9 x10E3/uL   EOS (ABSOLUTE) 0.1 0.0 - 0.4 x10E3/uL   Basophils Absolute 0.0 0.0 - 0.2 x10E3/uL   Immature Granulocytes 0 Not Estab. %   Immature Grans (Abs) 0.0 0.0 - 0.1 x10E3/uL  Lipid panel     Status:  None   Collection Time: 12/03/21  8:38 AM  Result Value Ref Range   Cholesterol,  Total 139 100 - 199 mg/dL   Triglycerides 79 0 - 149 mg/dL   HDL 47 >39 mg/dL   VLDL Cholesterol Cal 15 5 - 40 mg/dL   LDL Chol Calc (NIH) 77 0 - 99 mg/dL   Chol/HDL Ratio 3.0 0.0 - 4.4 ratio    Comment:                                   T. Chol/HDL Ratio                                             Men  Women                               1/2 Avg.Risk  3.4    3.3                                   Avg.Risk  5.0    4.4                                2X Avg.Risk  9.6    7.1                                3X Avg.Risk 23.4   11.0   B Nat Peptide     Status: Abnormal   Collection Time: 12/03/21  8:38 AM  Result Value Ref Range   BNP 187.8 (H) 0.0 - 100.0 pg/mL    Comment: Siemens ADVIA Centaur XP methodology  Basic metabolic panel     Status: Abnormal   Collection Time: 12/14/21  8:36 AM  Result Value Ref Range   Glucose 110 (H) 70 - 99 mg/dL   BUN 21 8 - 27 mg/dL   Creatinine, Ser 1.14 (H) 0.57 - 1.00 mg/dL   eGFR 48 (L) >59 mL/min/1.73   BUN/Creatinine Ratio 18 12 - 28   Sodium 143 134 - 144 mmol/L   Potassium 4.9 3.5 - 5.2 mmol/L   Chloride 107 (H) 96 - 106 mmol/L   CO2 19 (L) 20 - 29 mmol/L   Calcium 10.0 8.7 - 10.3 mg/dL    PHQ2/9:    02/26/2022    8:53 AM 07/09/2021    8:56 AM 06/29/2021    2:29 PM 05/28/2021   10:08 AM 01/07/2021    8:29 AM  Depression screen PHQ 2/9  Decreased Interest 0 0 0 0 0  Down, Depressed, Hopeless 0 0 0 0 0  PHQ - 2 Score 0 0 0 0 0  Altered sleeping 0 0 0    Tired, decreased energy 3 0 0    Change in appetite 0 0 0    Feeling bad or failure about yourself  0 0 0    Trouble concentrating 0 0 0    Moving slowly or fidgety/restless 0 0 0    Suicidal thoughts 0 0 0    PHQ-9 Score 3 0 0  Difficult doing work/chores   Not difficult at all      phq 9 is negative   Fall Risk:    02/26/2022    8:53 AM 07/09/2021    8:56 AM 06/29/2021    2:29 PM 05/28/2021   10:11 AM 01/07/2021    8:29 AM  Fall Risk   Falls in the past year? 0 0 0 0 0   Number falls in past yr: 0  0 0 0  Injury with Fall? 0  0 0 0  Risk for fall due to : Impaired balance/gait  No Fall Risks No Fall Risks   Follow up Falls prevention discussed Falls prevention discussed Falls prevention discussed Falls prevention discussed       Functional Status Survey: Is the patient deaf or have difficulty hearing?: No Does the patient have difficulty seeing, even when wearing glasses/contacts?: No Does the patient have difficulty concentrating, remembering, or making decisions?: No Does the patient have difficulty walking or climbing stairs?: Yes Does the patient have difficulty dressing or bathing?: No Does the patient have difficulty doing errands alone such as visiting a doctor's office or shopping?: No    Assessment & Plan  Problem List Items Addressed This Visit     Chronic systolic heart failure (HCC) (Chronic)    Currently seems to be doing well , continue medications . Adding Farxiga      Relevant Medications   dapagliflozin propanediol (FARXIGA) 10 MG TABS tablet   Other Relevant Orders   BASIC METABOLIC PANEL WITH GFR   Hypertension, benign    BP is at goal, continue medications       Relevant Orders   BASIC METABOLIC PANEL WITH GFR   Dyslipidemia    Discussed adding Zetia but she declined, continue statin therapy       Aortic atherosclerosis (HCC)    Continue statin therapy       Morbid obesity with BMI of 40.0-44.9, adult (HCC)    Discussed decrease in calorie intake and try to move more around her house       Relevant Medications   dapagliflozin propanediol (FARXIGA) 10 MG TABS tablet   NICM (nonischemic cardiomyopathy) ([place]) - Primary    Recently seen by Dr. Fletcher Anon, Lasix has been discontinued due to drop in kidney function but she is still taking potassium. I will recheck GFR and potassium level  Adding Farxiga for CHF since not on lasix      Simple chronic bronchitis (HCC)    Asymptomatic at this time      Chronic a-fib  (HCC)   Stage 3a chronic kidney disease ([place])    Adding Farxiga today , she is on ARB  Delene Loll )       Relevant Medications   dapagliflozin propanediol (FARXIGA) 10 MG TABS tablet   Other Relevant Orders   BASIC METABOLIC PANEL WITH GFR   Metabolic syndrome   Osteoarthritis, knee

## 2022-02-26 ENCOUNTER — Ambulatory Visit (INDEPENDENT_AMBULATORY_CARE_PROVIDER_SITE_OTHER): Payer: Medicare Other | Admitting: Family Medicine

## 2022-02-26 ENCOUNTER — Encounter: Payer: Self-pay | Admitting: Family Medicine

## 2022-02-26 ENCOUNTER — Telehealth: Payer: Self-pay | Admitting: Family Medicine

## 2022-02-26 VITALS — BP 130/82 | HR 96 | Resp 16 | Ht 66.0 in | Wt 282.0 lb

## 2022-02-26 DIAGNOSIS — J410 Simple chronic bronchitis: Secondary | ICD-10-CM

## 2022-02-26 DIAGNOSIS — N1831 Chronic kidney disease, stage 3a: Secondary | ICD-10-CM | POA: Diagnosis not present

## 2022-02-26 DIAGNOSIS — I10 Essential (primary) hypertension: Secondary | ICD-10-CM

## 2022-02-26 DIAGNOSIS — I428 Other cardiomyopathies: Secondary | ICD-10-CM | POA: Diagnosis not present

## 2022-02-26 DIAGNOSIS — Z6841 Body Mass Index (BMI) 40.0 and over, adult: Secondary | ICD-10-CM

## 2022-02-26 DIAGNOSIS — I5022 Chronic systolic (congestive) heart failure: Secondary | ICD-10-CM | POA: Diagnosis not present

## 2022-02-26 DIAGNOSIS — M170 Bilateral primary osteoarthritis of knee: Secondary | ICD-10-CM | POA: Diagnosis not present

## 2022-02-26 DIAGNOSIS — E8881 Metabolic syndrome: Secondary | ICD-10-CM

## 2022-02-26 DIAGNOSIS — I700 Atherosclerosis of aorta: Secondary | ICD-10-CM

## 2022-02-26 DIAGNOSIS — I482 Chronic atrial fibrillation, unspecified: Secondary | ICD-10-CM

## 2022-02-26 DIAGNOSIS — E785 Hyperlipidemia, unspecified: Secondary | ICD-10-CM

## 2022-02-26 LAB — BASIC METABOLIC PANEL WITH GFR
BUN: 20 mg/dL (ref 7–25)
CO2: 23 mmol/L (ref 20–32)
Calcium: 9.5 mg/dL (ref 8.6–10.4)
Chloride: 112 mmol/L — ABNORMAL HIGH (ref 98–110)
Creat: 0.86 mg/dL (ref 0.60–0.95)
Glucose, Bld: 87 mg/dL (ref 65–99)
Potassium: 4.5 mmol/L (ref 3.5–5.3)
Sodium: 143 mmol/L (ref 135–146)
eGFR: 68 mL/min/{1.73_m2} (ref >=60)

## 2022-02-26 MED ORDER — DAPAGLIFLOZIN PROPANEDIOL 10 MG PO TABS: 10.0000 mg | ORAL_TABLET | Freq: Every day | ORAL | 1 refills | Status: DC

## 2022-02-26 NOTE — Assessment & Plan Note (Signed)
Adding [place] today , she is on ARB  Delene Loll )

## 2022-02-26 NOTE — Assessment & Plan Note (Signed)
BP is at goal, continue medications

## 2022-02-26 NOTE — Assessment & Plan Note (Signed)
Currently seems to be doing well , continue medications . Adding Wilder Glade

## 2022-02-26 NOTE — Assessment & Plan Note (Signed)
Discussed decrease in calorie intake and try to move more around her house

## 2022-02-26 NOTE — Assessment & Plan Note (Signed)
Discussed adding Zetia but she declined, continue statin therapy

## 2022-02-26 NOTE — Patient Instructions (Signed)
Wilder Glade it is a diabetes medication but is used for CHF also

## 2022-02-26 NOTE — Telephone Encounter (Signed)
Pt called to report that the current Rx is too expensive for her. She needs a more affordable alternative  dapagliflozin propanediol (FARXIGA) 10 MG TABS tablet

## 2022-02-26 NOTE — Assessment & Plan Note (Signed)
Continue statin therapy.

## 2022-02-26 NOTE — Assessment & Plan Note (Signed)
Asymptomatic at this time 

## 2022-02-26 NOTE — Assessment & Plan Note (Signed)
Recently seen by Dr. Fletcher Anon, Lasix has been discontinued due to drop in kidney function but she is still taking potassium. I will recheck GFR and potassium level  Adding Farxiga for CHF since not on lasix

## 2022-03-01 ENCOUNTER — Other Ambulatory Visit: Payer: Self-pay | Admitting: Family Medicine

## 2022-03-01 DIAGNOSIS — I5022 Chronic systolic (congestive) heart failure: Secondary | ICD-10-CM

## 2022-03-01 DIAGNOSIS — N1831 Chronic kidney disease, stage 3a: Secondary | ICD-10-CM

## 2022-03-01 NOTE — Telephone Encounter (Signed)
I would be happy to speak with her and see if we can help, can you please place a referral for her?   Kimberly Henry, [place] Pharmacist Practitioner  [facility] [phone]

## 2022-03-08 ENCOUNTER — Telehealth: Payer: Self-pay | Admitting: *Deleted

## 2022-03-08 NOTE — Chronic Care Management (AMB) (Signed)
  Chronic Care Management   Outreach Note  03/08/2022 Name: Kimberly Henry MRN: [id number] DOB: 1940-12-26  Kimberly Henry is a 81 y.o. year old female who is a primary care patient of Alba Cory, MD. I reached out to [facility] by phone today in response to a referral sent by Ms. Kimberly Henry's primary care provider.  An unsuccessful telephone outreach was attempted today. The patient was referred to the case management team for assistance with care management and care coordination.   Follow Up Plan: A HIPAA compliant phone message was left for the patient providing contact information and requesting a return call.   Message also sent to PharmD on 03/01/2022 that would also have assistant reach out to start patient assistance process.   Burman Nieves, CCMA Care Guide, Embedded Care Coordination [facility]  Care Management  Direct Dial: [phone]

## 2022-04-19 ENCOUNTER — Telehealth: Payer: Self-pay | Admitting: Cardiovascular Disease

## 2022-04-19 DIAGNOSIS — M1712 Unilateral primary osteoarthritis, left knee: Secondary | ICD-10-CM | POA: Diagnosis not present

## 2022-04-19 NOTE — Telephone Encounter (Signed)
   Pre-operative Risk Assessment    Patient Name: Kimberly Henry  DOB: 1940/10/23 MRN: [id number]{     Request for Surgical Clearance    Procedure:   LEFT TKA TRADITIONAL KNEE  Date of Surgery:  Clearance 06/02/22                                Surgeon:  DR Kurtis Bushman Surgeon's Group or Practice Name:  [facility] Phone number:  [phone] Fax number:  [phone]  Type of Clearance Requested:   - Pharmacy:  Hold Apixaban (Eliquis)    Type of Anesthesia:  Spinal AND LOCAL  Additional requests/questions:    [name]   04/19/2022, 12:13 PM

## 2022-04-20 NOTE — Telephone Encounter (Signed)
I left a message for the patient to return my call to set up a tele visit for clearance.

## 2022-04-20 NOTE — Telephone Encounter (Signed)
Primary Cardiologist:Muhammad Fletcher Anon, MD   Preoperative team, please contact this patient and set up a phone call appointment for further preoperative risk assessment. Please obtain consent and complete medication review. Thank you for your help.   I confirm that guidance regarding antiplatelet and oral anticoagulation therapy has been completed and, if necessary, noted below.   Emmaline Life, NP-C    04/20/2022, 8:15 AM [place] [address] Office [phone] Fax [phone]

## 2022-04-20 NOTE — Telephone Encounter (Signed)
Patient with diagnosis of atrial fibrillation on Eliquis for anticoagulation.    Procedure: Left TKA traditional knee Date of procedure: 06/02/22   CHA2DS2-VASc Score = 6   This indicates a 9.7% annual risk of stroke. The patient's score is based upon: CHF History: 1 HTN History: 1 Diabetes History: 0 Stroke History: 0 Vascular Disease History: 1 Age Score: 2 Gender Score: 1  CrCl 104 Platelet count 260  Per office protocol, patient can hold Eliquis for 3 days prior to procedure.   Patient will not need bridging with Lovenox (enoxaparin) around procedure.  **This guidance is not considered finalized until pre-operative APP has relayed final recommendations.**

## 2022-04-21 NOTE — Telephone Encounter (Signed)
Left message to call back. We can either set up a tele appt and she can keep Dr. Fletcher Anon appt as planned, or move up in person appt for pre op, though this will not be with Dr. Fletcher Anon as I did not see any available appts. Call back and ask to s/w the pre op team so that we decide which appt she prefers

## 2022-04-26 ENCOUNTER — Other Ambulatory Visit: Payer: Self-pay | Admitting: Cardiovascular Disease

## 2022-04-26 DIAGNOSIS — I700 Atherosclerosis of aorta: Secondary | ICD-10-CM

## 2022-04-26 DIAGNOSIS — E785 Hyperlipidemia, unspecified: Secondary | ICD-10-CM

## 2022-04-27 NOTE — Telephone Encounter (Signed)
Today's call was our office's 3rd attempt to reach the pt. I will send letter to the pt to call our office in regard to sooner appt for pre op clearance. I will update the requesting office as well.  Will remove from the pre op call back pool at this time.

## 2022-04-27 NOTE — Telephone Encounter (Signed)
Left message x 2 for the pt to call back. See previous notes.

## 2022-05-09 ENCOUNTER — Other Ambulatory Visit: Payer: Self-pay | Admitting: Family

## 2022-05-21 NOTE — Telephone Encounter (Signed)
S/w the pt and she tells me that she scheduled an appt 05/24/22 @ 8:25 with Cadence Kathlen Mody, PAC for pre op clearance.

## 2022-05-23 NOTE — Progress Notes (Unsigned)
Cardiology Office Note:    Date:  05/24/2022   ID:  Kimberly Henry, DOB November 16, 1940, MRN [id number]  PCP:  Steele Sizer, MD  [facility] Cardiologist:  Kathlyn Sacramento, MD  [facility] Electrophysiologist:  None   Referring MD: Steele Sizer, MD   Chief Complaint: 6 month follow-up/pre-op clearance for knee surgery  History of Present Illness:    Kimberly Henry is a 81 y.o. female with a hx of chronic systolic heart failure, NICM, chronic afib, HTN, obesity, COPD, HLD, OA and previous smoker who presents for 6 month follow-up.   Dignosed with systolic heart failure in August 2018. She was noted to have a LBBB. Cardiac cath showed mild nonobstructive CAD with an EF 25-30%. RHC showed severely elevated flling pressures, severly reduced cardiac output and severe pulmonary hypertension. She was treated with medial therapy. She declined CRT given improvement of her symptoms.   Echo in May 2021 showed LVEF 50-55%, severely dilated LA. She was diagnosed with afib at that same time. She was continued on rate control.   Last seen 11/2021 and reported exertional dyspnea and 15lb weight gain. She was not taking lasix daily. BNP Was 180 and it was recommended she take lasix '20mg'$  daily.   Today, the patient presents for left knee replacement scheduled for 9/26. She has general wear and tear. She denies chest pain, LLE, orthopnea, pnd, lightheadedness, dizziness, palpitations. No bleeding issues. Per pharmacy recommendations the patient can hold Eliquis 3 days prior to the procedure. She uses a cane when she walks far distances. She can walk 1-2 blocks and a flight of stairs. She can perform all ADLs. She is not taking Farxgia due to cost. She takes spironolactone daily, not lasix.   Past Medical History:  Diagnosis Date   Acid phosphatase elevated    Aortic atherosclerosis (HCC)    Atrial fibrillation (HCC)    AV block, 1st degree    COPD (chronic obstructive pulmonary disease) (HCC)     HFrEF (heart failure with reduced ejection fraction) ([place])    a. 04/2017 Echo: EF 30-35%, antsept HK, mild MR, mod dil LA, mildly dil RA, mod TR, mildly to mod elev PASP; b. 08/2017 Echo: EF 30-35%, diff HK, antsept HK. Gr1 DD. Mild to mod MR. PASP nl.   Hx of cervical malignancy    Hyperlipidemia    Hypertension    LBBB (left bundle branch block)    Metabolic syndrome    NICM (nonischemic cardiomyopathy) ([place])    a. 04/2017 Echo: EF 30-35%; b. 04/2017 Cath: nonobs dzs; c. 08/2017 Echo: EF 30-35%, diff HK, antsept HK. Gr1 DD. Mild to mod MR. PASP nl.   Non-obstructive CAD (coronary artery disease)    a. 04/2017 Cath: mild nonobs CAD. EF 25-35%. CO 3.14, CI 1.38. Sev PAH [61/34(47].   Obesity, Class III, BMI 40-49.9 (morbid obesity) (HCC)    Osteoarthritis of both knees    PAH (pulmonary arterial hypertension) with portal hypertension ([place])    a. 04/2017 Right Heart Cath: Sev PAH 61/34(47); b. 08/2017 Echo: nl PASP.    Past Surgical History:  Procedure Laterality Date   ABDOMINAL HYSTERECTOMY  1996   CARDIAC CATHETERIZATION  2018   d/t CHF   CATARACT EXTRACTION Left 2008   CATARACT EXTRACTION W/PHACO Right 03/11/2021   Procedure: CATARACT EXTRACTION PHACO AND INTRAOCULAR LENS PLACEMENT ([place]) RIGHT;  Surgeon: Leandrew Koyanagi, MD;  Location: [place];  Service: Ophthalmology;  Laterality: Right;  7.70 01:31.1   JOINT REPLACEMENT Right 06/2020  KNEE CLOSED REDUCTION Right 10/29/2020   Procedure: CLOSED MANIPULATION KNEE;  Surgeon: Lovell Sheehan, MD;  Location: [facility];  Service: Orthopedics;  Laterality: Right;   RIGHT/LEFT HEART CATH AND CORONARY ANGIOGRAPHY N/A 04/21/2017   Procedure: RIGHT/LEFT HEART CATH AND CORONARY ANGIOGRAPHY;  Surgeon: Wellington Hampshire, MD;  Location: [facility];  Service: Cardiovascular;  Laterality: N/A;   TOTAL KNEE ARTHROPLASTY Right 07/02/2020   Procedure: TOTAL KNEE ARTHROPLASTY;  Surgeon: Lovell Sheehan, MD;  Location: [facility];   Service: Orthopedics;  Laterality: Right;    Current Medications: Current Meds  Medication Sig   acetaminophen (TYLENOL) 500 MG tablet Take 1,000 mg by mouth every 6 (six) hours as needed for pain.    carvedilol (COREG) 6.25 MG tablet TAKE 1 TABLET BY MOUTH TWICE  DAILY WITH MEALS   ELIQUIS 5 MG TABS tablet TAKE 1 TABLET BY MOUTH  TWICE DAILY   ENTRESTO 97-103 MG TAKE 1 TABLET BY MOUTH TWICE  DAILY   levocetirizine (XYZAL) 5 MG tablet TAKE 1 TABLET BY MOUTH IN  THE EVENING   Multiple Vitamin (MULTIVITAMIN WITH MINERALS) TABS tablet Take 1 tablet by mouth daily.   potassium chloride SA (KLOR-CON M) 20 MEQ tablet Take 1 tablet (20 mEq total) by mouth daily.   rosuvastatin (CRESTOR) 40 MG tablet TAKE 1 TABLET BY MOUTH DAILY   spironolactone (ALDACTONE) 25 MG tablet TAKE 1 TABLET BY MOUTH DAILY     Allergies:   Patient has no known allergies.   Social History   Socioeconomic History   Marital status: Widowed    Spouse name: Not on file   Number of children: 4   Years of education: 77   Highest education level: 12th grade  Occupational History   Occupation: helps disabled persons    Comment: part time  Tobacco Use   Smoking status: Former    Years: 10.00    Types: Cigarettes    Start date: 09/14/1975    Quit date: 06/22/1986    Years since quitting: 35.9   Smokeless tobacco: Never  Vaping Use   Vaping Use: Never used  Substance and Sexual Activity   Alcohol use: No    Alcohol/week: 0.0 standard drinks of alcohol   Drug use: No   Sexual activity: Not Currently  Other Topics Concern   Not on file  Social History Narrative   Patient has 4 grown children-1 of which is deceased, 34 grandchildren & 5 great-grandchildren      Patient lives with her grandson.    Social Determinants of Health   Financial Resource Strain: Low Risk  (05/28/2021)   Overall Financial Resource Strain (CARDIA)    Difficulty of Paying Living Expenses: Not hard at all  Food Insecurity: No Food  Insecurity (05/28/2021)   Hunger Vital Sign    Worried About Running Out of Food in the Last Year: Never true    Ran Out of Food in the Last Year: Never true  Transportation Needs: No Transportation Needs (05/28/2021)   PRAPARE - Hydrologist (Medical): No    Lack of Transportation (Non-Medical): No  Physical Activity: Inactive (05/28/2021)   Exercise Vital Sign    Days of Exercise per Week: 0 days    Minutes of Exercise per Session: 0 min  Stress: No Stress Concern Present (05/28/2021)   [place]    Feeling of Stress : Not at all  Social Connections: Moderately Isolated (  05/28/2021)   Social Connection and Isolation Panel [NHANES]    Frequency of Communication with Friends and Family: More than three times a week    Frequency of Social Gatherings with Friends and Family: Three times a week    Attends Religious Services: More than 4 times per year    Active Member of Clubs or Organizations: No    Attends Archivist Meetings: Never    Marital Status: Widowed     Family History: The patient's family history includes Congestive Heart Failure in her father and mother; Hypertension in her mother; Prostate cancer in her son.  ROS:   Please see the history of present illness.     All other systems reviewed and are negative.  EKGs/Labs/Other Studies Reviewed:    The following studies were reviewed today:  Echo 2021  1. Left ventricular ejection fraction, by estimation, is 50 to 55%. Left  ventricular ejection fraction by PLAX is 50 %. The left ventricle has low  normal function. The left ventricle has no regional wall motion  abnormalities. Left ventricular diastolic  parameters were normal.   2. Right ventricular systolic function is normal. The right ventricular  size is normal. There is mildly elevated pulmonary artery systolic  pressure.   3. Left atrial size was mildly  dilated.   4. The mitral valve is grossly normal. Trivial mitral valve  regurgitation.   5. The aortic valve is grossly normal. Aortic valve regurgitation is not  visualized. No aortic stenosis is present.   Cardiac cath 2018 There is moderate to severe left ventricular systolic dysfunction. The left ventricular ejection fraction is 25-35% by visual estimate. LV end diastolic pressure is moderately elevated. Ost Cx to Prox Cx lesion, 30 %stenosed.   1. Mild nonobstructive coronary artery disease. 2. Moderately to severely reduced LV systolic function with an EF of 25-35%. 3. Right heart catheterization showed moderately elevated filling pressures, severe pulmonary hypertension and severely reduced cardiac output at 3.14 with a cardiac index of 1.38. Pulmonary Wedge pressure was 27 mmHg and PA pressure was 61/34 with a mean of 47 mmHg.   Recommendations: The patient has nonischemic cardiomyopathy. In spite of multiple days of IV diuresis, she continues to be significantly volume overloaded. Low cardiac output is also concerning. I increased furosemide to 60 mg IV twice daily. I added digoxin and spironolactone. The patient needs at least few days of intravenous diuresis.    EKG:  EKG is ordered today.  The ekg ordered today demonstrates Afib 62bpm, LAD, LBBB, no changes  Recent Labs: 12/03/2021: ALT 16; BNP 187.8; Hemoglobin 12.1; Platelets 260 02/26/2022: BUN 20; Creat 0.86; Potassium 4.5; Sodium 143  Recent Lipid Panel    Component Value Date/Time   CHOL 139 12/03/2021 0838   TRIG 79 12/03/2021 0838   HDL 47 12/03/2021 0838   CHOLHDL 3.0 12/03/2021 0838   CHOLHDL 2.9 01/07/2021 0000   VLDL 22 03/02/2017 1242   LDLCALC 77 12/03/2021 0838   LDLCALC 75 01/07/2021 0000   Physical Exam:    VS:  BP 133/76 (BP Location: Left Arm, Patient Position: Sitting, Cuff Size: Large)   Pulse 62   Ht '5\' 3"'$  (1.6 m)   Wt 283 lb 6.4 oz (128.5 kg)   SpO2 98%   BMI 50.20 kg/m     Wt Readings  from Last 3 Encounters:  05/24/22 283 lb 6.4 oz (128.5 kg)  02/26/22 282 lb (127.9 kg)  12/03/21 279 lb 6 oz (126.7 kg)  GEN:  Well nourished, well developed in no acute distress HEENT: Normal NECK: No JVD; No carotid bruits LYMPHATICS: No lymphadenopathy CARDIAC: Irreg Irreg, no murmurs, rubs, gallops RESPIRATORY:  Clear to auscultation without rales, wheezing or rhonchi  ABDOMEN: Soft, non-tender, non-distended MUSCULOSKELETAL:  No edema; No deformity  SKIN: Warm and dry NEUROLOGIC:  Alert and oriented x 3 PSYCHIATRIC:  Normal affect   ASSESSMENT:    1. Chronic systolic heart failure ([place])   2. Pre-operative cardiovascular examination   3. Permanent atrial fibrillation ([place])   4. Essential hypertension    PLAN:    In order of problems listed above:  Pre-op evaluation for L knee replacement HTN Permanent Afib on Eliquis Chronic systolic heart failure The patient presents for cardiac pre-operative evaluation for L knee replacement scheduled 9/26. She has general wear and tear of her knee, no knee injuries. She uses a cane for walking far distances. In general,she can perform all ADLs, walk 1-2 blocks and up a flight of stairs. She denies chest pain, SOB, LLE, orthopnea or pnd. She is in permanent afib with controlled rates. She is on Eliquis According to pharmacy, it is OK to hold Eliquis 3 days prior to procedure. She is euvolemic on exam today and BP is good. Most recent echo showed LVEF 50-55% with mild to moderate valve disease. METS>4. According to RCRI she is Class 2 risk with 6% 30 day risk of death, MI or cardiac arrest. No plan for further cardiac testing prior to surgery, OK to proceed from a cardiac perspective.   Disposition: Follow up in 6 month(s) with MD/APP     Signed, Yehudit Fulginiti Ninfa Meeker, PA-C  05/24/2022 8:57 AM    [facility]

## 2022-05-24 ENCOUNTER — Encounter: Payer: Self-pay | Admitting: Medical

## 2022-05-24 ENCOUNTER — Ambulatory Visit: Payer: Medicare Other | Attending: Cardiovascular Disease | Admitting: Medical

## 2022-05-24 VITALS — BP 133/76 | HR 62 | Ht 63.0 in | Wt 283.4 lb

## 2022-05-24 DIAGNOSIS — I10 Essential (primary) hypertension: Secondary | ICD-10-CM | POA: Diagnosis not present

## 2022-05-24 DIAGNOSIS — I5022 Chronic systolic (congestive) heart failure: Secondary | ICD-10-CM | POA: Diagnosis not present

## 2022-05-24 DIAGNOSIS — Z01810 Encounter for preprocedural cardiovascular examination: Secondary | ICD-10-CM | POA: Diagnosis not present

## 2022-05-24 DIAGNOSIS — I4821 Permanent atrial fibrillation: Secondary | ICD-10-CM

## 2022-05-24 NOTE — Patient Instructions (Signed)
Medication Instructions:  Your physician recommends that you continue on your current medications as directed. Please refer to the Current Medication list given to you today.  *If you need a refill on your cardiac medications before your next appointment, please call your pharmacy*   Lab Work: None ordered  If you have labs (blood work) drawn today and your tests are completely normal, you will receive your results only by: MyChart Message (if you have MyChart) OR A paper copy in the mail If you have any lab test that is abnormal or we need to change your treatment, we will call you to review the results.   Testing/Procedures: None ordered   Follow-Up: At [facility], you and your health needs are our priority.  As part of our continuing mission to provide you with exceptional heart care, we have created designated Provider Care Teams.  These Care Teams include your primary Cardiologist (physician) and Advanced Practice Providers (APPs -  Physician Assistants and Nurse Practitioners) who all work together to provide you with the care you need, when you need it.  We recommend signing up for the patient portal called "MyChart".  Sign up information is provided on this After Visit Summary.  MyChart is used to connect with patients for Virtual Visits (Telemedicine).  Patients are able to view lab/test results, encounter notes, upcoming appointments, etc.  Non-urgent messages can be sent to your provider as well.   To learn more about what you can do with MyChart, go to [email or web address].    Your next appointment:   6 month(s)  The format for your next appointment:   In Person  Provider:   You may see Muhammad Arida, MD or one of the following Advanced Practice Providers on your designated Care Team:   Christopher Berge, NP Ryan Dunn, PA-C Cadence Furth, PA-C Sheri Hammock, NP   Other Instructions N/A  Important Information About Sugar       

## 2022-05-25 ENCOUNTER — Other Ambulatory Visit: Payer: Self-pay

## 2022-05-25 ENCOUNTER — Other Ambulatory Visit: Payer: Self-pay | Admitting: Orthopedic Surgery

## 2022-05-25 ENCOUNTER — Telehealth: Payer: Self-pay | Admitting: Family Medicine

## 2022-05-25 ENCOUNTER — Encounter: Payer: Self-pay | Admitting: Orthopedic Surgery

## 2022-05-25 DIAGNOSIS — Z01818 Encounter for other preprocedural examination: Secondary | ICD-10-CM

## 2022-05-25 NOTE — Telephone Encounter (Addendum)
Pt is requesting medication dapagliflozin propanediol (FARXIGA) 10 MG TABS tablet be removed from her active list. Pt stated she has never taken and will not take the medication. Pt stated she has left Dr.Sowles a message in the past telling her she will not be taking the medication.   Please advise.

## 2022-05-25 NOTE — H&P (Signed)
Kimberly Henry MRN:  [id number] DOB/SEX:  1940-12-24/female  CHIEF COMPLAINT:  Painful left Knee  HISTORY: Patient is a 81 y.o. female presented with a history of pain in the left knee. Onset of symptoms was gradual starting several years ago with gradually worsening course since that time. Prior procedures on the knee include none. Patient has been treated conservatively with over-the-counter NSAIDs and activity modification. Patient currently rates pain in the knee at 10 out of 10 with activity. There is pain at night.  PAST MEDICAL HISTORY: Patient Active Problem List   Diagnosis Date Noted   Stage 3a chronic kidney disease ([place]) 02/26/2022   Arthritis of left knee 06/09/2021   S/P TKR (total knee replacement) using cement, right 07/02/2020   Simple chronic bronchitis (HCC)    CAD (coronary artery disease)    Chronic a-fib (HCC)    Lung nodule seen on imaging study 01/09/2020   NICM (nonischemic cardiomyopathy) ([place]) 01/09/2020   Morbid obesity with BMI of 40.0-44.9, adult ([place]) 23/76/2831   Chronic systolic heart failure ([place]) 04/28/2017   Aortic atherosclerosis ([place]) 03/10/2017   Perennial allergic rhinitis with seasonal variation 05/30/2015   1st degree AV block 51/76/1607   Metabolic syndrome 37/06/6268   History of cervical cancer 03/03/2015   Hypertension, benign 03/03/2015   Osteoarthritis, knee 03/03/2015   Dyslipidemia 03/03/2015   Past Medical History:  Diagnosis Date   Acid phosphatase elevated    Aortic atherosclerosis (HCC)    Atrial fibrillation (HCC)    AV block, 1st degree    COPD (chronic obstructive pulmonary disease) ([place])    HFrEF (heart failure with reduced ejection fraction) ([place])    a. 04/2017 Echo: EF 30-35%, antsept HK, mild MR, mod dil LA, mildly dil RA, mod TR, mildly to mod elev PASP; b. 08/2017 Echo: EF 30-35%, diff HK, antsept HK. Gr1 DD. Mild to mod MR. PASP nl.   Hx of cervical malignancy    Hyperlipidemia    Hypertension    LBBB (left  bundle branch block)    Metabolic syndrome    NICM (nonischemic cardiomyopathy) ([place])    a. 04/2017 Echo: EF 30-35%; b. 04/2017 Cath: nonobs dzs; c. 08/2017 Echo: EF 30-35%, diff HK, antsept HK. Gr1 DD. Mild to mod MR. PASP nl.   Non-obstructive CAD (coronary artery disease)    a. 04/2017 Cath: mild nonobs CAD. EF 25-35%. CO 3.14, CI 1.38. Sev PAH [61/34(47].   Obesity, Class III, BMI 40-49.9 (morbid obesity) (HCC)    Osteoarthritis of both knees    PAH (pulmonary arterial hypertension) with portal hypertension ([place])    a. 04/2017 Right Heart Cath: Sev PAH 61/34(47); b. 08/2017 Echo: nl PASP.   Past Surgical History:  Procedure Laterality Date   ABDOMINAL HYSTERECTOMY  1996   CARDIAC CATHETERIZATION  2018   d/t CHF   CATARACT EXTRACTION Left 2008   CATARACT EXTRACTION W/PHACO Right 03/11/2021   Procedure: CATARACT EXTRACTION PHACO AND INTRAOCULAR LENS PLACEMENT ([place]) RIGHT;  Surgeon: Leandrew Koyanagi, MD;  Location: [facility];  Service: Ophthalmology;  Laterality: Right;  7.70 01:31.1   JOINT REPLACEMENT Right 06/2020   KNEE CLOSED REDUCTION Right 10/29/2020   Procedure: CLOSED MANIPULATION KNEE;  Surgeon: Lovell Sheehan, MD;  Location: [facility];  Service: Orthopedics;  Laterality: Right;   RIGHT/LEFT HEART CATH AND CORONARY ANGIOGRAPHY N/A 04/21/2017   Procedure: RIGHT/LEFT HEART CATH AND CORONARY ANGIOGRAPHY;  Surgeon: Wellington Hampshire, MD;  Location: [facility];  Service: Cardiovascular;  Laterality: N/A;  TOTAL KNEE ARTHROPLASTY Right 07/02/2020   Procedure: TOTAL KNEE ARTHROPLASTY;  Surgeon: Lovell Sheehan, MD;  Location: [facility];  Service: Orthopedics;  Laterality: Right;     MEDICATIONS:  (Not in a hospital admission)   ALLERGIES:  No Known Allergies  REVIEW OF SYSTEMS:  Pertinent items are noted in HPI.   FAMILY HISTORY:   Family History  Problem Relation Age of Onset   Hypertension Mother    Congestive Heart Failure Mother    Congestive Heart  Failure Father    Prostate cancer Son     SOCIAL HISTORY:   Social History   Tobacco Use   Smoking status: Former    Years: 10.00    Types: Cigarettes    Start date: 09/14/1975    Quit date: 06/22/1986    Years since quitting: 35.9   Smokeless tobacco: Never  Substance Use Topics   Alcohol use: No    Alcohol/week: 0.0 standard drinks of alcohol     EXAMINATION:  Vital signs in last 24 hours: '@VSRANGES'$ @  General appearance: alert, cooperative, and no distress Neck: no JVD and supple, symmetrical, trachea midline Lungs: clear to auscultation bilaterally Heart: regular rate and rhythm, S1, S2 normal, no murmur, click, rub or gallop Abdomen: soft, non-tender; bowel sounds normal; no masses,  no organomegaly Extremities: extremities normal, atraumatic, no cyanosis or edema and Homans sign is negative, no sign of DVT Pulses: 2+ and symmetric Skin: Skin color, texture, turgor normal. No rashes or lesions  Musculoskeletal:  ROM 0-100, Ligaments intact,  Imaging Review Plain radiographs demonstrate severe degenerative joint disease of the left knee. The overall alignment is significant varus. The bone quality appears to be good for age and reported activity level.  Assessment/Plan: Primary osteoarthritis, left knee   The patient history, physical examination and imaging studies are consistent with advanced degenerative joint disease of the left knee. The patient has failed conservative treatment.  The clearance notes were reviewed.  After discussion with the patient it was felt that Total Knee Replacement was indicated. The procedure,  risks, and benefits of total knee arthroplasty were presented and reviewed. The risks including but not limited to aseptic loosening, infection, blood clots, vascular injury, stiffness, patella tracking problems complications among others were discussed. The patient acknowledged the explanation, agreed to proceed with the plan.  Carlynn Spry 05/25/2022, 8:51 AM

## 2022-05-25 NOTE — H&P (Deleted)
  The note originally documented on this encounter has been moved the the encounter in which it belongs.  

## 2022-05-25 NOTE — Telephone Encounter (Signed)
Completed.

## 2022-05-27 ENCOUNTER — Other Ambulatory Visit: Payer: Self-pay

## 2022-05-27 ENCOUNTER — Encounter
Admission: RE | Admit: 2022-05-27 | Discharge: 2022-05-27 | Disposition: A | Discharge status: Home / Self Care | Payer: Medicare Other | Source: Ambulatory Visit | Attending: Orthopedic Surgery | Admitting: Orthopedic Surgery

## 2022-05-27 VITALS — BP 134/68 | HR 58 | Resp 16 | Ht 63.0 in | Wt 283.3 lb

## 2022-05-27 DIAGNOSIS — Z01812 Encounter for preprocedural laboratory examination: Secondary | ICD-10-CM

## 2022-05-27 DIAGNOSIS — Z01818 Encounter for other preprocedural examination: Secondary | ICD-10-CM | POA: Diagnosis not present

## 2022-05-27 HISTORY — DX: Long term (current) use of anticoagulants: Z79.01

## 2022-05-27 LAB — BASIC METABOLIC PANEL
Anion gap: 7 (ref 5–15)
BUN: 14 mg/dL (ref 8–23)
CO2: 25 mmol/L (ref 22–32)
Calcium: 9.3 mg/dL (ref 8.9–10.3)
Chloride: 110 mmol/L (ref 98–111)
Creatinine, Ser: 0.8 mg/dL (ref 0.44–1.00)
GFR, Estimated: >60 mL/min (ref >60)
Glucose, Bld: 90 mg/dL (ref 70–99)
Potassium: 4 mmol/L (ref 3.5–5.1)
Sodium: 142 mmol/L (ref 135–145)

## 2022-05-27 LAB — URINALYSIS, ROUTINE W REFLEX MICROSCOPIC
Bacteria, UA: NONE SEEN
Bilirubin Urine: NEGATIVE
Glucose, UA: NEGATIVE mg/dL
Ketones, ur: NEGATIVE mg/dL
Nitrite: NEGATIVE
Protein, ur: 100 mg/dL — AB
Specific Gravity, Urine: 1.028 (ref 1.005–1.030)
pH: 5 (ref 5.0–8.0)

## 2022-05-27 LAB — SURGICAL PCR SCREEN
MRSA, PCR: NEGATIVE
Staphylococcus aureus: NEGATIVE

## 2022-05-27 LAB — CBC
HCT: 35.6 % — ABNORMAL LOW (ref 36.0–46.0)
Hemoglobin: 11.6 g/dL — ABNORMAL LOW (ref 12.0–15.0)
MCH: 30.9 pg (ref 26.0–34.0)
MCHC: 32.6 g/dL (ref 30.0–36.0)
MCV: 94.9 fL (ref 80.0–100.0)
Platelets: 251 10*3/uL (ref 150–400)
RBC: 3.75 MIL/uL — ABNORMAL LOW (ref 3.87–5.11)
RDW: 11.9 % (ref 11.5–15.5)
WBC: 3.6 10*3/uL — ABNORMAL LOW (ref 4.0–10.5)
nRBC: 0 % (ref 0.0–0.2)

## 2022-05-27 LAB — TYPE AND SCREEN
ABO/RH(D): B POS
Antibody Screen: NEGATIVE

## 2022-05-27 NOTE — Patient Instructions (Addendum)
Your procedure is scheduled on: 06/08/22 - Tuesday Report to the Registration Desk on the 1st floor of the [facility]. To find out your arrival time, please call [phone] between 1PM - 3PM on: 06/07/22 - Monday If your arrival time is 6:00 am, do not arrive prior to that time as the [facility] entrance doors do not open until 6:00 am.  REMEMBER: Instructions that are not followed completely may result in serious medical risk, up to and including death; or upon the discretion of your surgeon and anesthesiologist your surgery may need to be rescheduled.  Do not eat food or drink any fluids after midnight the night before surgery.  No gum chewing, lozengers or hard candies.   TAKE THESE MEDICATIONS THE MORNING OF SURGERY WITH A SIP OF WATER: - carvedilol (COREG)    Hold Eliquis beginning 06/05/22, may resume taking with doctors order.  One week prior to surgery: Stop Anti-inflammatories (NSAIDS) such as Advil, Aleve, Ibuprofen, Motrin, Naproxen, Naprosyn and Aspirin based products such as Excedrin, Goodys Powder, BC Powder.  Stop ANY OVER THE COUNTER supplements until after surgery.  You may however, continue to take Tylenol if needed for pain up until the day of surgery.  No Alcohol for 24 hours before or after surgery.  No Smoking including e-cigarettes for 24 hours prior to surgery.  No chewable tobacco products for at least 6 hours prior to surgery.  No nicotine patches on the day of surgery.  Do not use any "recreational" drugs for at least a week prior to your surgery.  Please be advised that the combination of cocaine and anesthesia may have negative outcomes, up to and including death. If you test positive for cocaine, your surgery will be cancelled.  On the morning of surgery brush your teeth with toothpaste and water, you may rinse your mouth with mouthwash if you wish. Do not swallow any toothpaste or mouthwash.  Use CHG Soap or wipes as directed on instruction  sheet.  Do not wear jewelry, make-up, hairpins, clips or nail polish.  Do not wear lotions, powders, or perfumes.   Do not shave body from the neck down 48 hours prior to surgery just in case you cut yourself which could leave a site for infection.  Also, freshly shaved skin may become irritated if using the CHG soap.  Contact lenses, hearing aids and dentures may not be worn into surgery.  Do not bring valuables to the hospital. [facility] is not responsible for any missing/lost belongings or valuables.   Notify your doctor if there is any change in your medical condition (cold, fever, infection).  Wear comfortable clothing (specific to your surgery type) to the hospital.  After surgery, you can help prevent lung complications by doing breathing exercises.  Take deep breaths and cough every 1-2 hours. Your doctor may order a device called an Incentive Spirometer to help you take deep breaths. When coughing or sneezing, hold a pillow firmly against your incision with both hands. This is called "splinting." Doing this helps protect your incision. It also decreases belly discomfort.  If you are being admitted to the hospital overnight, leave your suitcase in the car. After surgery it may be brought to your room.  If you are being discharged the day of surgery, you will not be allowed to drive home. You will need a responsible adult (18 years or older) to drive you home and stay with you that night.   If you are taking public transportation,  you will need to have a responsible adult (18 years or older) with you. Please confirm with your physician that it is acceptable to use public transportation.   Please call the [facility] Dept. at [phone] if you have any questions about these instructions.  Surgery Visitation Policy:  Patients undergoing a surgery or procedure may have two family members or support persons with them as long as the person is not COVID-19 positive  or experiencing its symptoms.   Inpatient Visitation:    Visiting hours are 7 a.m. to 8 p.m. Up to four visitors are allowed at one time in a patient room, including children. The visitors may rotate out with other people during the day. One designated support person (adult) may remain overnight.

## 2022-06-07 MED ORDER — CHLORHEXIDINE GLUCONATE 0.12 % MT SOLN
15.0000 mL | Freq: Once | OROMUCOSAL | Status: AC
  Administered 2022-06-08: 15 mL via OROMUCOSAL

## 2022-06-07 MED ORDER — FAMOTIDINE 20 MG PO TABS
20.0000 mg | ORAL_TABLET | Freq: Once | ORAL | Status: AC
  Administered 2022-06-08: 20 mg via ORAL

## 2022-06-07 MED ORDER — CEFAZOLIN SODIUM-DEXTROSE 2-4 GM/100ML-% IV SOLN
2.0000 g | INTRAVENOUS | Status: AC
  Administered 2022-06-08: 2 g via INTRAVENOUS

## 2022-06-07 MED ORDER — TRANEXAMIC ACID-NACL 1000-0.7 MG/100ML-% IV SOLN
1000.0000 mg | INTRAVENOUS | Status: AC
  Administered 2022-06-08: 1000 mg via INTRAVENOUS

## 2022-06-07 MED ORDER — LACTATED RINGERS IV SOLN: INTRAVENOUS | Status: DC

## 2022-06-07 MED ORDER — ORAL CARE MOUTH RINSE: 15.0000 mL | Freq: Once | OROMUCOSAL | Status: AC

## 2022-06-08 ENCOUNTER — Other Ambulatory Visit: Payer: Self-pay

## 2022-06-08 ENCOUNTER — Ambulatory Visit: Payer: Medicare Other | Admitting: Urgent Care

## 2022-06-08 ENCOUNTER — Observation Stay
Admission: RE | Admit: 2022-06-08 | Discharge: 2022-06-09 | Disposition: A | Discharge status: Home Health | Payer: Medicare Other | Attending: Orthopedic Surgery | Admitting: Orthopedic Surgery

## 2022-06-08 ENCOUNTER — Observation Stay: Payer: Medicare Other

## 2022-06-08 ENCOUNTER — Ambulatory Visit: Payer: Medicare Other

## 2022-06-08 ENCOUNTER — Encounter: Payer: Self-pay | Admitting: Orthopedic Surgery

## 2022-06-08 ENCOUNTER — Encounter
Admission: RE | Disposition: A | Discharge status: Home Health | Payer: Self-pay | Source: Home / Self Care | Attending: Orthopedic Surgery

## 2022-06-08 ENCOUNTER — Ambulatory Visit: Payer: Medicare Other | Admitting: Certified Registered"

## 2022-06-08 DIAGNOSIS — R001 Bradycardia, unspecified: Secondary | ICD-10-CM | POA: Insufficient documentation

## 2022-06-08 DIAGNOSIS — I482 Chronic atrial fibrillation, unspecified: Secondary | ICD-10-CM | POA: Insufficient documentation

## 2022-06-08 DIAGNOSIS — I428 Other cardiomyopathies: Secondary | ICD-10-CM

## 2022-06-08 DIAGNOSIS — J449 Chronic obstructive pulmonary disease, unspecified: Secondary | ICD-10-CM | POA: Diagnosis not present

## 2022-06-08 DIAGNOSIS — Z79899 Other long term (current) drug therapy: Secondary | ICD-10-CM | POA: Insufficient documentation

## 2022-06-08 DIAGNOSIS — Z87891 Personal history of nicotine dependence: Secondary | ICD-10-CM | POA: Insufficient documentation

## 2022-06-08 DIAGNOSIS — I110 Hypertensive heart disease with heart failure: Secondary | ICD-10-CM | POA: Diagnosis not present

## 2022-06-08 DIAGNOSIS — Z8541 Personal history of malignant neoplasm of cervix uteri: Secondary | ICD-10-CM | POA: Diagnosis not present

## 2022-06-08 DIAGNOSIS — I2510 Atherosclerotic heart disease of native coronary artery without angina pectoris: Secondary | ICD-10-CM | POA: Insufficient documentation

## 2022-06-08 DIAGNOSIS — Z7901 Long term (current) use of anticoagulants: Secondary | ICD-10-CM | POA: Diagnosis not present

## 2022-06-08 DIAGNOSIS — Z96652 Presence of left artificial knee joint: Secondary | ICD-10-CM

## 2022-06-08 DIAGNOSIS — Z96651 Presence of right artificial knee joint: Secondary | ICD-10-CM | POA: Diagnosis not present

## 2022-06-08 DIAGNOSIS — M1712 Unilateral primary osteoarthritis, left knee: Secondary | ICD-10-CM | POA: Diagnosis not present

## 2022-06-08 DIAGNOSIS — I440 Atrioventricular block, first degree: Secondary | ICD-10-CM | POA: Diagnosis not present

## 2022-06-08 DIAGNOSIS — R0600 Dyspnea, unspecified: Secondary | ICD-10-CM | POA: Diagnosis not present

## 2022-06-08 DIAGNOSIS — I5022 Chronic systolic (congestive) heart failure: Secondary | ICD-10-CM | POA: Insufficient documentation

## 2022-06-08 DIAGNOSIS — G8918 Other acute postprocedural pain: Secondary | ICD-10-CM | POA: Diagnosis not present

## 2022-06-08 DIAGNOSIS — Z96659 Presence of unspecified artificial knee joint: Secondary | ICD-10-CM

## 2022-06-08 DIAGNOSIS — E785 Hyperlipidemia, unspecified: Secondary | ICD-10-CM | POA: Diagnosis present

## 2022-06-08 HISTORY — PX: TOTAL KNEE ARTHROPLASTY: SHX125

## 2022-06-08 LAB — CBC WITH DIFFERENTIAL/PLATELET
Abs Immature Granulocytes: 0.01 10*3/uL (ref 0.00–0.07)
Basophils Absolute: 0 10*3/uL (ref 0.0–0.1)
Basophils Relative: 0 %
Eosinophils Absolute: 0 10*3/uL (ref 0.0–0.5)
Eosinophils Relative: 1 %
HCT: 37.4 % (ref 36.0–46.0)
Hemoglobin: 12.1 g/dL (ref 12.0–15.0)
Immature Granulocytes: 0 %
Lymphocytes Relative: 16 %
Lymphs Abs: 1.1 10*3/uL (ref 0.7–4.0)
MCH: 31 pg (ref 26.0–34.0)
MCHC: 32.4 g/dL (ref 30.0–36.0)
MCV: 95.9 fL (ref 80.0–100.0)
Monocytes Absolute: 0.1 10*3/uL (ref 0.1–1.0)
Monocytes Relative: 1 %
Neutro Abs: 5.6 10*3/uL (ref 1.7–7.7)
Neutrophils Relative %: 82 %
Platelets: 231 10*3/uL (ref 150–400)
RBC: 3.9 MIL/uL (ref 3.87–5.11)
RDW: 11.9 % (ref 11.5–15.5)
WBC: 6.8 10*3/uL (ref 4.0–10.5)
nRBC: 0 % (ref 0.0–0.2)

## 2022-06-08 LAB — COMPREHENSIVE METABOLIC PANEL
ALT: 21 U/L (ref 0–44)
AST: 25 U/L (ref 15–41)
Albumin: 4 g/dL (ref 3.5–5.0)
Alkaline Phosphatase: 94 U/L (ref 38–126)
Anion gap: 7 (ref 5–15)
BUN: 12 mg/dL (ref 8–23)
CO2: 26 mmol/L (ref 22–32)
Calcium: 9.2 mg/dL (ref 8.9–10.3)
Chloride: 108 mmol/L (ref 98–111)
Creatinine, Ser: 0.95 mg/dL (ref 0.44–1.00)
GFR, Estimated: >60 mL/min (ref >60)
Glucose, Bld: 121 mg/dL — ABNORMAL HIGH (ref 70–99)
Potassium: 4.1 mmol/L (ref 3.5–5.1)
Sodium: 141 mmol/L (ref 135–145)
Total Bilirubin: 0.9 mg/dL (ref 0.3–1.2)
Total Protein: 7.4 g/dL (ref 6.5–8.1)

## 2022-06-08 LAB — BRAIN NATRIURETIC PEPTIDE: B Natriuretic Peptide: 290.7 pg/mL — ABNORMAL HIGH (ref 0.0–100.0)

## 2022-06-08 LAB — TROPONIN I (HIGH SENSITIVITY)
Troponin I (High Sensitivity): 11 ng/L (ref <18)
Troponin I (High Sensitivity): 11 ng/L (ref <18)

## 2022-06-08 SURGERY — ARTHROPLASTY, KNEE, TOTAL
Anesthesia: Regional | Site: Knee | Laterality: Left

## 2022-06-08 MED ORDER — ONDANSETRON HCL 4 MG/2ML IJ SOLN
INTRAMUSCULAR | Status: AC
  Filled 2022-06-08: qty 2

## 2022-06-08 MED ORDER — FENTANYL CITRATE (PF) 100 MCG/2ML IJ SOLN: 25.0000 ug | INTRAMUSCULAR | Status: DC | PRN

## 2022-06-08 MED ORDER — MAGNESIUM CITRATE PO SOLN: 1.0000 | Freq: Once | ORAL | Status: DC | PRN

## 2022-06-08 MED ORDER — SUGAMMADEX SODIUM 500 MG/5ML IV SOLN
INTRAVENOUS | Status: AC
  Filled 2022-06-08: qty 5

## 2022-06-08 MED ORDER — BUPIVACAINE HCL (PF) 0.5 % IJ SOLN
INTRAMUSCULAR | Status: AC
  Filled 2022-06-08: qty 20

## 2022-06-08 MED ORDER — METOCLOPRAMIDE HCL 10 MG PO TABS: 5.0000 mg | ORAL_TABLET | Freq: Three times a day (TID) | ORAL | Status: DC | PRN

## 2022-06-08 MED ORDER — GLYCOPYRROLATE 0.2 MG/ML IJ SOLN
INTRAMUSCULAR | Status: DC | PRN
  Administered 2022-06-08: .2 mg via INTRAVENOUS

## 2022-06-08 MED ORDER — PHENOL 1.4 % MT LIQD: 1.0000 | OROMUCOSAL | Status: DC | PRN

## 2022-06-08 MED ORDER — ATROPINE SULFATE 1 MG/10ML IJ SOSY
PREFILLED_SYRINGE | INTRAMUSCULAR | Status: AC
  Filled 2022-06-08: qty 10

## 2022-06-08 MED ORDER — POVIDONE-IODINE 10 % EX SWAB
2.0000 | Freq: Once | CUTANEOUS | Status: AC
  Administered 2022-06-08: 2 via TOPICAL

## 2022-06-08 MED ORDER — EPHEDRINE SULFATE (PRESSORS) 50 MG/ML IJ SOLN
5.0000 mg | Freq: Once | INTRAMUSCULAR | Status: AC
  Administered 2022-06-08: 5 mg via INTRAVENOUS

## 2022-06-08 MED ORDER — MORPHINE SULFATE (PF) 2 MG/ML IV SOLN: 0.5000 mg | INTRAVENOUS | Status: DC | PRN

## 2022-06-08 MED ORDER — KETAMINE HCL 50 MG/5ML IJ SOSY
PREFILLED_SYRINGE | INTRAMUSCULAR | Status: AC
  Filled 2022-06-08: qty 5

## 2022-06-08 MED ORDER — ACETAMINOPHEN 10 MG/ML IV SOLN: 1000.0000 mg | Freq: Once | INTRAVENOUS | Status: DC | PRN

## 2022-06-08 MED ORDER — DEXAMETHASONE SODIUM PHOSPHATE 10 MG/ML IJ SOLN
INTRAMUSCULAR | Status: AC
  Filled 2022-06-08: qty 1

## 2022-06-08 MED ORDER — ROSUVASTATIN CALCIUM 10 MG PO TABS
40.0000 mg | ORAL_TABLET | Freq: Every day | ORAL | Status: DC
  Administered 2022-06-08: 40 mg via ORAL
  Filled 2022-06-08: qty 4

## 2022-06-08 MED ORDER — CARVEDILOL 6.25 MG PO TABS
6.2500 mg | ORAL_TABLET | Freq: Two times a day (BID) | ORAL | Status: DC
  Administered 2022-06-09: 6.25 mg via ORAL
  Filled 2022-06-08: qty 1

## 2022-06-08 MED ORDER — BISACODYL 10 MG RE SUPP: 10.0000 mg | Freq: Every day | RECTAL | Status: DC | PRN

## 2022-06-08 MED ORDER — APIXABAN 5 MG PO TABS
5.0000 mg | ORAL_TABLET | Freq: Two times a day (BID) | ORAL | Status: DC
  Administered 2022-06-09: 5 mg via ORAL
  Filled 2022-06-08: qty 1

## 2022-06-08 MED ORDER — LORATADINE 10 MG PO TABS
10.0000 mg | ORAL_TABLET | Freq: Every day | ORAL | Status: DC
  Administered 2022-06-08: 10 mg via ORAL
  Filled 2022-06-08: qty 1

## 2022-06-08 MED ORDER — NOREPINEPHRINE 4 MG/250ML-% IV SOLN
INTRAVENOUS | Status: AC
  Filled 2022-06-08: qty 250

## 2022-06-08 MED ORDER — ACETAMINOPHEN 325 MG PO TABS: 325.0000 mg | ORAL_TABLET | Freq: Four times a day (QID) | ORAL | Status: DC | PRN

## 2022-06-08 MED ORDER — FENTANYL CITRATE (PF) 100 MCG/2ML IJ SOLN
INTRAMUSCULAR | Status: DC | PRN
  Administered 2022-06-08: 100 ug via INTRAVENOUS
  Administered 2022-06-08: 50 ug via INTRAVENOUS

## 2022-06-08 MED ORDER — CEFAZOLIN SODIUM-DEXTROSE 2-4 GM/100ML-% IV SOLN
2.0000 g | Freq: Four times a day (QID) | INTRAVENOUS | Status: AC
  Administered 2022-06-08 – 2022-06-09 (×2): 2 g via INTRAVENOUS
  Filled 2022-06-08 (×2): qty 100

## 2022-06-08 MED ORDER — GLYCOPYRROLATE 0.2 MG/ML IJ SOLN
INTRAMUSCULAR | Status: AC
  Filled 2022-06-08: qty 3

## 2022-06-08 MED ORDER — OXYCODONE HCL 5 MG PO TABS
5.0000 mg | ORAL_TABLET | Freq: Once | ORAL | Status: AC | PRN
  Administered 2022-06-08: 5 mg via ORAL

## 2022-06-08 MED ORDER — ACETAMINOPHEN 10 MG/ML IV SOLN
INTRAVENOUS | Status: DC | PRN
  Administered 2022-06-08: 1000 mg via INTRAVENOUS

## 2022-06-08 MED ORDER — HYDROCODONE-ACETAMINOPHEN 5-325 MG PO TABS: 1.0000 | ORAL_TABLET | ORAL | Status: DC | PRN

## 2022-06-08 MED ORDER — SURGIPHOR WOUND IRRIGATION SYSTEM - OPTIME
TOPICAL | Status: DC | PRN
  Administered 2022-06-08: 450 mL via TOPICAL

## 2022-06-08 MED ORDER — FENTANYL CITRATE PF 50 MCG/ML IJ SOSY: 50.0000 ug | PREFILLED_SYRINGE | INTRAMUSCULAR | Status: DC | PRN

## 2022-06-08 MED ORDER — DOCUSATE SODIUM 100 MG PO CAPS
100.0000 mg | ORAL_CAPSULE | Freq: Two times a day (BID) | ORAL | Status: DC
  Administered 2022-06-08 – 2022-06-09 (×2): 100 mg via ORAL
  Filled 2022-06-08 (×2): qty 1

## 2022-06-08 MED ORDER — BUPIVACAINE HCL (PF) 0.5 % IJ SOLN
INTRAMUSCULAR | Status: DC | PRN
  Administered 2022-06-08: 20 mL

## 2022-06-08 MED ORDER — SPIRONOLACTONE 25 MG PO TABS
25.0000 mg | ORAL_TABLET | Freq: Every day | ORAL | Status: DC
  Administered 2022-06-08: 25 mg via ORAL
  Filled 2022-06-08 (×2): qty 1

## 2022-06-08 MED ORDER — METOPROLOL TARTRATE 5 MG/5ML IV SOLN: 5.0000 mg | INTRAVENOUS | Status: DC | PRN

## 2022-06-08 MED ORDER — OXYCODONE HCL 5 MG PO TABS
ORAL_TABLET | ORAL | Status: AC
  Filled 2022-06-08: qty 1

## 2022-06-08 MED ORDER — KETOROLAC TROMETHAMINE 15 MG/ML IJ SOLN
7.5000 mg | Freq: Four times a day (QID) | INTRAMUSCULAR | Status: AC
  Administered 2022-06-08 – 2022-06-09 (×4): 7.5 mg via INTRAVENOUS
  Filled 2022-06-08 (×3): qty 1

## 2022-06-08 MED ORDER — ROCURONIUM BROMIDE 100 MG/10ML IV SOLN
INTRAVENOUS | Status: DC | PRN
  Administered 2022-06-08: 70 mg via INTRAVENOUS

## 2022-06-08 MED ORDER — PROPOFOL 10 MG/ML IV BOLUS
INTRAVENOUS | Status: DC | PRN
  Administered 2022-06-08: 100 mg via INTRAVENOUS

## 2022-06-08 MED ORDER — ONDANSETRON HCL 4 MG/2ML IJ SOLN: 4.0000 mg | Freq: Four times a day (QID) | INTRAMUSCULAR | Status: DC | PRN

## 2022-06-08 MED ORDER — SACUBITRIL-VALSARTAN 97-103 MG PO TABS
1.0000 | ORAL_TABLET | Freq: Two times a day (BID) | ORAL | Status: DC
  Administered 2022-06-08 – 2022-06-09 (×2): 1 via ORAL
  Filled 2022-06-08 (×2): qty 1

## 2022-06-08 MED ORDER — METOCLOPRAMIDE HCL 5 MG/ML IJ SOLN: 5.0000 mg | Freq: Three times a day (TID) | INTRAMUSCULAR | Status: DC | PRN

## 2022-06-08 MED ORDER — DIPHENHYDRAMINE HCL 12.5 MG/5ML PO ELIX: 12.5000 mg | ORAL_SOLUTION | ORAL | Status: DC | PRN

## 2022-06-08 MED ORDER — POTASSIUM CHLORIDE CRYS ER 20 MEQ PO TBCR
20.0000 meq | EXTENDED_RELEASE_TABLET | Freq: Every day | ORAL | Status: DC
  Administered 2022-06-08: 20 meq via ORAL
  Filled 2022-06-08 (×2): qty 1

## 2022-06-08 MED ORDER — ALUM & MAG HYDROXIDE-SIMETH 200-200-20 MG/5ML PO SUSP: 30.0000 mL | ORAL | Status: DC | PRN

## 2022-06-08 MED ORDER — SODIUM CHLORIDE FLUSH 0.9 % IV SOLN
INTRAVENOUS | Status: AC
  Filled 2022-06-08: qty 40

## 2022-06-08 MED ORDER — LEVOCETIRIZINE DIHYDROCHLORIDE 5 MG PO TABS: 5.0000 mg | ORAL_TABLET | Freq: Every evening | ORAL | Status: DC

## 2022-06-08 MED ORDER — DEXAMETHASONE SODIUM PHOSPHATE 10 MG/ML IJ SOLN
INTRAMUSCULAR | Status: DC | PRN
  Administered 2022-06-08: 5 mg via INTRAVENOUS

## 2022-06-08 MED ORDER — LIDOCAINE HCL (CARDIAC) PF 100 MG/5ML IV SOSY
PREFILLED_SYRINGE | INTRAVENOUS | Status: DC | PRN
  Administered 2022-06-08: 60 mg via INTRAVENOUS

## 2022-06-08 MED ORDER — SODIUM CHLORIDE 0.9 % IR SOLN
Status: DC | PRN
  Administered 2022-06-08: 3000 mL

## 2022-06-08 MED ORDER — CARVEDILOL 6.25 MG PO TABS
6.2500 mg | ORAL_TABLET | Freq: Two times a day (BID) | ORAL | Status: DC
  Administered 2022-06-08: 6.25 mg via ORAL
  Filled 2022-06-08: qty 1

## 2022-06-08 MED ORDER — PHENYLEPHRINE HCL-NACL 20-0.9 MG/250ML-% IV SOLN
INTRAVENOUS | Status: DC | PRN
  Administered 2022-06-08: 30 ug/min via INTRAVENOUS

## 2022-06-08 MED ORDER — BUPIVACAINE-EPINEPHRINE (PF) 0.5% -1:200000 IJ SOLN
INTRAMUSCULAR | Status: AC
  Filled 2022-06-08: qty 30

## 2022-06-08 MED ORDER — ACETAMINOPHEN 10 MG/ML IV SOLN
INTRAVENOUS | Status: AC
  Filled 2022-06-08: qty 100

## 2022-06-08 MED ORDER — ONDANSETRON HCL 4 MG/2ML IJ SOLN: 4.0000 mg | Freq: Once | INTRAMUSCULAR | Status: DC | PRN

## 2022-06-08 MED ORDER — ALBUMIN HUMAN 5 % IV SOLN
INTRAVENOUS | Status: AC
  Filled 2022-06-08: qty 250

## 2022-06-08 MED ORDER — OXYCODONE HCL 5 MG/5ML PO SOLN: 5.0000 mg | Freq: Once | ORAL | Status: AC | PRN

## 2022-06-08 MED ORDER — BUPIVACAINE LIPOSOME 1.3 % IJ SUSP
INTRAMUSCULAR | Status: AC
  Filled 2022-06-08: qty 20

## 2022-06-08 MED ORDER — CEFAZOLIN SODIUM-DEXTROSE 2-4 GM/100ML-% IV SOLN
INTRAVENOUS | Status: AC
  Filled 2022-06-08: qty 100

## 2022-06-08 MED ORDER — FENTANYL CITRATE PF 50 MCG/ML IJ SOSY
PREFILLED_SYRINGE | INTRAMUSCULAR | Status: AC
  Filled 2022-06-08: qty 1

## 2022-06-08 MED ORDER — KETAMINE HCL 10 MG/ML IJ SOLN
INTRAMUSCULAR | Status: DC | PRN
  Administered 2022-06-08: 20 mg via INTRAVENOUS

## 2022-06-08 MED ORDER — LACTATED RINGERS IV SOLN: INTRAVENOUS | Status: DC

## 2022-06-08 MED ORDER — FENTANYL CITRATE (PF) 250 MCG/5ML IJ SOLN
INTRAMUSCULAR | Status: AC
  Filled 2022-06-08: qty 5

## 2022-06-08 MED ORDER — 0.9 % SODIUM CHLORIDE (POUR BTL) OPTIME
TOPICAL | Status: DC | PRN
  Administered 2022-06-08: 1000 mL

## 2022-06-08 MED ORDER — EPHEDRINE SULFATE (PRESSORS) 50 MG/ML IJ SOLN
INTRAMUSCULAR | Status: AC
  Administered 2022-06-08: 50 mg
  Filled 2022-06-08: qty 1

## 2022-06-08 MED ORDER — HYDROCODONE-ACETAMINOPHEN 7.5-325 MG PO TABS
1.0000 | ORAL_TABLET | ORAL | Status: DC | PRN
  Administered 2022-06-08 – 2022-06-09 (×4): 1 via ORAL
  Filled 2022-06-08 (×4): qty 1

## 2022-06-08 MED ORDER — KETOROLAC TROMETHAMINE 15 MG/ML IJ SOLN
INTRAMUSCULAR | Status: AC
  Filled 2022-06-08: qty 1

## 2022-06-08 MED ORDER — ONDANSETRON HCL 4 MG PO TABS: 4.0000 mg | ORAL_TABLET | Freq: Four times a day (QID) | ORAL | Status: DC | PRN

## 2022-06-08 MED ORDER — MENTHOL 3 MG MT LOZG: 1.0000 | LOZENGE | OROMUCOSAL | Status: DC | PRN

## 2022-06-08 MED ORDER — CHLORHEXIDINE GLUCONATE 0.12 % MT SOLN
OROMUCOSAL | Status: AC
  Filled 2022-06-08: qty 15

## 2022-06-08 MED ORDER — SODIUM CHLORIDE (PF) 0.9 % IJ SOLN
INTRAMUSCULAR | Status: DC | PRN
  Administered 2022-06-08: 90 mL

## 2022-06-08 MED ORDER — SUGAMMADEX SODIUM 500 MG/5ML IV SOLN
INTRAVENOUS | Status: DC | PRN
  Administered 2022-06-08: 250 mg via INTRAVENOUS

## 2022-06-08 MED ORDER — TRANEXAMIC ACID-NACL 1000-0.7 MG/100ML-% IV SOLN
INTRAVENOUS | Status: AC
  Filled 2022-06-08: qty 100

## 2022-06-08 MED ORDER — ONDANSETRON HCL 4 MG/2ML IJ SOLN
INTRAMUSCULAR | Status: DC | PRN
  Administered 2022-06-08: 4 mg via INTRAVENOUS

## 2022-06-08 SURGICAL SUPPLY — 58 items
BASEPLATE TIBIAL SZ 4 LT (Knees) IMPLANT
BLADE SAGITTAL AGGR TOOTH XLG (BLADE) ×1 IMPLANT
BLADE SAW SAG 25X90X1.19 (BLADE) ×1 IMPLANT
BOWL CEMENT MIX W/ADAPTER (MISCELLANEOUS) ×1 IMPLANT
BRUSH SCRUB EZ  4% CHG (MISCELLANEOUS) ×1
BRUSH SCRUB EZ 4% CHG (MISCELLANEOUS) ×1 IMPLANT
CEMENT BONE 40GM (Cement) ×2 IMPLANT
CHLORAPREP W/TINT 26 (MISCELLANEOUS) ×2 IMPLANT
COMP PATELLA FENESIS 32 OVAL (Stem) ×1 IMPLANT
COMPONENT FEM OXIN SZ 5N LT (Knees) IMPLANT
COMPONENT PTLLA GENS 32 OVAL (Stem) IMPLANT
COOLER POLAR GLACIER W/PUMP (MISCELLANEOUS) ×1 IMPLANT
CUFF TOURN SGL QUICK 34 (TOURNIQUET CUFF)
CUFF TRNQT CYL 34X4.125X (TOURNIQUET CUFF) ×1 IMPLANT
DRAPE 3/4 80X56 (DRAPES) ×2 IMPLANT
DRAPE INCISE IOBAN 66X60 STRL (DRAPES) ×1 IMPLANT
ELECT REM PT RETURN 9FT ADLT (ELECTROSURGICAL) ×1
ELECTRODE REM PT RTRN 9FT ADLT (ELECTROSURGICAL) ×1 IMPLANT
GAUZE SPONGE 4X4 12PLY STRL (GAUZE/BANDAGES/DRESSINGS) ×1 IMPLANT
GAUZE XEROFORM 1X8 LF (GAUZE/BANDAGES/DRESSINGS) ×1 IMPLANT
GLOVE BIO SURGEON STRL SZ8 (GLOVE) ×1 IMPLANT
GLOVE BIOGEL PI IND STRL 8.5 (GLOVE) ×2 IMPLANT
GLOVE SURG ORTHO 8.5 STRL (GLOVE) ×1 IMPLANT
GOWN STRL REUS W/ TWL LRG LVL3 (GOWN DISPOSABLE) ×1 IMPLANT
GOWN STRL REUS W/ TWL XL LVL3 (GOWN DISPOSABLE) ×1 IMPLANT
GOWN STRL REUS W/TWL LRG LVL3 (GOWN DISPOSABLE) ×1
GOWN STRL REUS W/TWL XL LVL3 (GOWN DISPOSABLE) ×1
HOOD PEEL AWAY FLYTE STAYCOOL (MISCELLANEOUS) ×2 IMPLANT
INSERT TIB XLPE 9 SZ 3-4 (Knees) IMPLANT
IRRIGATION SURGIPHOR STRL (IV SOLUTION) ×1 IMPLANT
IV NS IRRIG 3000ML ARTHROMATIC (IV SOLUTION) ×1 IMPLANT
KIT TURNOVER KIT A (KITS) ×1 IMPLANT
MANIFOLD NEPTUNE II (INSTRUMENTS) ×1 IMPLANT
MAT ABSORB  FLUID 56X50 GRAY (MISCELLANEOUS) ×1
MAT ABSORB FLUID 56X50 GRAY (MISCELLANEOUS) ×1 IMPLANT
NDL SAFETY ECLIP 18X1.5 (MISCELLANEOUS) ×1 IMPLANT
NDL SPNL 20GX3.5 QUINCKE YW (NEEDLE) ×1 IMPLANT
NEEDLE SPNL 20GX3.5 QUINCKE YW (NEEDLE) ×1 IMPLANT
NS IRRIG 1000ML POUR BTL (IV SOLUTION) ×1 IMPLANT
PACK TOTAL KNEE (MISCELLANEOUS) ×1 IMPLANT
PAD DE MAYO PRESSURE PROTECT (MISCELLANEOUS) ×1 IMPLANT
PAD WRAPON POLAR KNEE (MISCELLANEOUS) ×1 IMPLANT
PULSAVAC PLUS IRRIG FAN TIP (DISPOSABLE) ×1
SOLUTION IRRIG SURGIPHOR (IV SOLUTION) ×1 IMPLANT
STAPLER SKIN PROX 35W (STAPLE) ×1 IMPLANT
SUCTION FRAZIER HANDLE 10FR (MISCELLANEOUS) ×1
SUCTION TUBE FRAZIER 10FR DISP (MISCELLANEOUS) ×1 IMPLANT
SUT DVC 2 QUILL PDO  T11 36X36 (SUTURE) ×1
SUT DVC 2 QUILL PDO T11 36X36 (SUTURE) ×1 IMPLANT
SUT VIC AB 2-0 CT1 18 (SUTURE) ×1 IMPLANT
SUT VIC AB 2-0 CT1 27 (SUTURE) ×1
SUT VIC AB 2-0 CT1 TAPERPNT 27 (SUTURE) IMPLANT
SUT VIC AB PLUS 45CM 1-MO-4 (SUTURE) ×1 IMPLANT
SYR 30ML LL (SYRINGE) ×3 IMPLANT
TIP FAN IRRIG PULSAVAC PLUS (DISPOSABLE) ×1 IMPLANT
TRAP FLUID SMOKE EVACUATOR (MISCELLANEOUS) ×1 IMPLANT
WATER STERILE IRR 500ML POUR (IV SOLUTION) ×1 IMPLANT
WRAPON POLAR PAD KNEE (MISCELLANEOUS) ×1

## 2022-06-08 NOTE — Consult Note (Addendum)
Initial Consultation Note   Patient: Kimberly Henry XIP:[id number] DOB: 1941/02/01 PCP: Steele Sizer, MD DOA: 06/08/2022 DOS: the patient was seen and examined on 06/08/2022 Primary service: Lovell Sheehan, MD  Referring physician: Dr. Danne Baxter  Reason for consult: Bradycardia, heart rate in the 20s with AV nodal block  At bedside, she is able to tell me her name, age, current year.  She does not appear to be in acute distress.  She had just finished dinner and looks calm and comfortable.  She reports that she was recovering in PACU and did not have concerns or complaints.  She denies chest pain, shortness of breath, abdominal pain, dysuria, hematuria, diarrhea, fever, chills, cough, nausea, vomiting, loss of consciousness.  She denies changes to swelling of her lower extremities.  Reports she was her normal self.  Assessment and Plan:  * Bradycardia - Per discussion with anesthesiologist, post total knee replacements, patient was recovering in PACU and did not appear to be in acute distress when she developed bradycardia with first AV nodal block - Patient was given ephedrine 50 mg IV and glycopyrrolate - At bedside patient is currently asymptomatic -  EKG with notes from nursing to upload into epic - Agree with admission to progressive, observation, telemetry - Would recommend holding carvedilol p.m. dosing at this time - Portable chest x-ray stat - CMP, CBC, high sensitive troponin, BNP - If the above work-up is unremarkable would recommend team to consider sending patient home with Holter monitor appropriate follow-up with her cardiologist, [facility]  Chronic a-fib ([place]) - Holding home carvedilol 6.25 mg p.o. evening dose, a.m. team to resume if patient does not experience symptomatic bradycardia overnight - Metoprolol 5 mg IV every 3 hours as needed for heart rate greater than 120, 4 doses ordered - Agree with resuming apixaban 5 mg p.o. twice daily  Morbid obesity with BMI of  40.0-44.9, adult ([place]) - Patient has BMI of 50. This complicates overall care and prognosis.   Dyslipidemia - Rosuvastatin 40 mg  EKG reviewed which showed atrial fibrillation with rate of 60, QTc 472, left bundle branch block which is unchanged from EKG on 05/24/2022 and 12/03/2021.  [facility] will continue to follow the patient.  HPI: Kimberly Henry is a 81 y.o. adult with past medical history of morbid obesity, hypertension, first AV nodal block, bilateral knee osteoarthritis, chronic atrial fibrillation, nonischemic cardiomyopathy, heart failure reduced ejection fraction, COPD, who presents as an outpatient total knee replacement.  Patient is now admitted to progressive cardiac due to bradycardia in postop.  Review of Systems: As mentioned in the history of present illness. All other systems reviewed and are negative. Past Medical History:  Diagnosis Date   Acid phosphatase elevated    Aortic atherosclerosis (HCC)    Atrial fibrillation (HCC)    a.) CHA2DS2VASc = 6 (age x 2, sex, HFrEF, HTN, vascular disease history);  b.) rate/rhythm maintained on oral carvedilol; chronically anticoagulated with apixaban   AV block, 1st degree    Cervical cancer ([place]) 1999   COPD (chronic obstructive pulmonary disease) ([place])    HFrEF (heart failure with reduced ejection fraction) ([place])    a.) 04/2017 Echo: EF 30-35%, antsept HK, mild MR, mod dil LA, mildly dil RA, mod TR, mildly to mod elev PASP; b.) 08/2017 Echo: EF 30-35%, diff HK, antsept HK. Gr1 DD. Mild to mod MR. PASP nl; c.) TTE 01/22/2020: EF 50-55%, mild LAE, triv MR; RVSP 38.3   Hx of cervical malignancy    Hyperlipidemia  Hypertension    LBBB (left bundle branch block)    Long term current use of anticoagulant    a.) apixaban   Metabolic syndrome    NICM (nonischemic cardiomyopathy) ([place])    a.) 04/2017 Echo: EF 30-35%; b.) 04/2017 Cath: nonobs dzs; c.) 08/2017 Echo: EF 30-35%, diff HK, antsept HK. Gr1 DD. Mild to mod MR. PASP nl; d.) TTE  01/22/2020: EF 50-55%, mild LAE, triv MR, RVSP 38.3   Non-obstructive CAD (coronary artery disease)    a. 04/2017 Cath: mild nonobs CAD. EF 25-35%. CO 3.14, CI 1.38. Sev PAH [61/34(47].   Obesity, Class III, BMI 40-49.9 (morbid obesity) (HCC)    Osteoarthritis of both knees    PAH (pulmonary arterial hypertension) with portal hypertension ([place])    a.) 04/2017 Right Heart Cath: Sev PAH 61/34(47); b.) 08/2017 Echo: nl PASP; c.) TTE 01/22/2020: RVSP 38.3   Past Surgical History:  Procedure Laterality Date   ABDOMINAL HYSTERECTOMY  1996   CARDIAC CATHETERIZATION  2018   d/t CHF   CATARACT EXTRACTION Left 2008   CATARACT EXTRACTION W/PHACO Right 03/11/2021   Procedure: CATARACT EXTRACTION PHACO AND INTRAOCULAR LENS PLACEMENT ([place]) RIGHT;  Surgeon: Leandrew Koyanagi, MD;  Location: [facility];  Service: Ophthalmology;  Laterality: Right;  7.70 01:31.1   JOINT REPLACEMENT Right 06/2020   KNEE CLOSED REDUCTION Right 10/29/2020   Procedure: CLOSED MANIPULATION KNEE;  Surgeon: Lovell Sheehan, MD;  Location: [facility];  Service: Orthopedics;  Laterality: Right;   RIGHT/LEFT HEART CATH AND CORONARY ANGIOGRAPHY N/A 04/21/2017   Procedure: RIGHT/LEFT HEART CATH AND CORONARY ANGIOGRAPHY;  Surgeon: Wellington Hampshire, MD;  Location: [facility];  Service: Cardiovascular;  Laterality: N/A;   TOTAL KNEE ARTHROPLASTY Right 07/02/2020   Procedure: TOTAL KNEE ARTHROPLASTY;  Surgeon: Lovell Sheehan, MD;  Location: [facility];  Service: Orthopedics;  Laterality: Right;   Social History:  reports that she quit smoking about 35 years ago. Her smoking use included cigarettes. She started smoking about 46 years ago. She has never used smokeless tobacco. She reports that she does not drink alcohol and does not use drugs.  No Known Allergies  Family History  Problem Relation Age of Onset   Hypertension Mother    Congestive Heart Failure Mother    Congestive Heart Failure Father    Prostate cancer  Son     Prior to Admission medications   Medication Sig Start Date End Date Taking? Authorizing Provider  acetaminophen (TYLENOL) 500 MG tablet Take 1,000 mg by mouth every 6 (six) hours as needed for pain.    Yes [provider]  carvedilol (COREG) 6.25 MG tablet TAKE 1 TABLET BY MOUTH TWICE  DAILY WITH MEALS 01/22/22  Yes Wellington Hampshire, MD  ENTRESTO 97-103 MG TAKE 1 TABLET BY MOUTH TWICE  DAILY 01/22/22  Yes Wellington Hampshire, MD  levocetirizine (XYZAL) 5 MG tablet TAKE 1 TABLET BY MOUTH IN  THE EVENING 02/02/22  Yes Sowles, Drue Stager, MD  Multiple Vitamin (MULTIVITAMIN WITH MINERALS) TABS tablet Take 1 tablet by mouth daily.   Yes [provider]  potassium chloride SA (KLOR-CON M) 20 MEQ tablet Take 1 tablet (20 mEq total) by mouth daily. 02/02/22  Yes Wellington Hampshire, MD  rosuvastatin (CRESTOR) 40 MG tablet TAKE 1 TABLET BY MOUTH DAILY 04/26/22  Yes Wellington Hampshire, MD  spironolactone (ALDACTONE) 25 MG tablet TAKE 1 TABLET BY MOUTH DAILY 01/22/22  Yes Wellington Hampshire, MD  ELIQUIS 5 MG TABS tablet  TAKE 1 TABLET BY MOUTH  TWICE DAILY 01/22/22   Wellington Hampshire, MD   Physical Exam: Vitals:   06/08/22 1630 06/08/22 1714 06/08/22 1815 06/08/22 1951  BP: 115/69 (!) 144/82 133/71 (!) 123/55  Pulse: 65 65 65 70  Resp: '16 16 20 20  '$ Temp:  (!) 97.4 F (36.3 C) 97.6 F (36.4 C) (!) 97.5 F (36.4 C)  TempSrc:  Oral Oral   SpO2: 100% 100% 100% 100%  Weight:      Height:       Data Reviewed:   CMP, CBC, high sensitive troponin, BNP were ordered and reviewed  Family Communication: no Primary team communication: yes  Thank you very much for involving [place] in the care of your patient.  Author: Dr. Tobie Poet 06/08/2022 8:58 PM  For on call review [email or web address].

## 2022-06-08 NOTE — Progress Notes (Signed)
PT Cancellation Note  Patient Details Name: Kimberly Henry MRN: [id number] DOB: 19-Apr-1941   Cancelled Treatment:    Reason Eval/Treat Not Completed: Patient not medically ready.  PT consult received.  Chart reviewed.  Pt noted with cardiac concerns s/p L TKA; PACU nurse reports pt going to cardiac unit (d/t cardiac concerns).  D/t medical concerns, will hold PT at this time and re-attempt PT evaluation tomorrow.  Leitha Bleak, PT 06/08/22, 4:47 PM

## 2022-06-08 NOTE — Op Note (Signed)
DATE OF SURGERY:  06/08/2022 TIME: 2:05 PM  PATIENT NAME:  Kimberly Henry   AGE: 81 y.o.    PRE-OPERATIVE DIAGNOSIS:  M17.12 Unilateral primary osteoarthritis, left knee  POST-OPERATIVE DIAGNOSIS:  Same  PROCEDURE:  Procedure(s): TOTAL KNEE ARTHROPLASTY, LEFT  SURGEON:  Lovell Sheehan, MD   ASSISTANT:  Carlynn Spry,  PA-C  OPERATIVE IMPLANTS: Tamala [employer]hew, Cruciate Retaining Oxinium Femoral component size 5 narrow, Fixed Bearing Tray size 4, Patella polyethylene 3-peg oval button size 32 mm, with a 9 mm DISH insert.   PREOPERATIVE INDICATIONS:  Kimberly Henry is an 81 y.o. adult who has a diagnosis of M17.12 Unilateral primary osteoarthritis, left knee and elected for a total knee arthroplasty after failing nonoperative treatment, including activity modification, pain medication, physical therapy and injections who has significant impairment of their activities of daily living.  Radiographs have demonstrated tricompartmental osteoarthritis joint space narrowing, osteophytes, subchondral sclerosis and cyst formation.  The risks, benefits, and alternatives were discussed at length including but not limited to the risks of infection, bleeding, nerve or blood vessel injury, knee stiffness, fracture, dislocation, loosening or failure of the hardware and the need for further surgery. Medical risks include but not limited to DVT and pulmonary embolism, myocardial infarction, stroke, pneumonia, respiratory failure and death. I discussed these risks with the patient in my office prior to the date of surgery. They understood these risks and were willing to proceed.  OPERATIVE FINDINGS AND UNIQUE ASPECTS OF THE CASE:  All three compartments with advanced and severe degenerative changes, large osteophytes and an abundance of synovial fluid. Significant deformity was also noted. A decision was made to proceed with total knee arthroplasty.   OPERATIVE DESCRIPTION:  The patient was brought to the  operative room and placed in a supine position after undergoing placement of a general anesthetic. IV antibiotics were given. Patient received tranexamic acid. The lower extremity was prepped and draped in the usual sterile fashion.  A time out was performed to verify the patient's name, date of birth, medical record number, correct site of surgery and correct procedure to be performed. The timeout was also used to confirm the patient received antibiotics and that appropriate instruments, implants and radiographs studies were available in the room.  The leg was elevated and exsanguinated with an Esmarch and the tourniquet was inflated to 275 mmHg.  A midline incision was made over the left knee.. A medial parapatellar arthrotomy was then made and the patella subluxed laterally and the knee was brought into 90 of flexion. Hoffa's fat pad along with the anterior cruciate ligament was resected and the medial joint line was exposed.  Attention was then turned to preparation of the patella. The thickness of the patella was measured with a caliper, the diameter measured with the patella templates.  The patella resection was then made with an oscillating saw using the patella cutting guide.  The 32 mm button fit appropriately.  3 peg holes for the patella component were then drilled.  The extramedullary tibial cutting guide was then placed using the anterior tibial crest and second ray of the foot as a reference.  The tibial cutting guide was adjusted to allow for appropriate posterior slope.  The tibial cutting block was pinned into position. The slotted stylus was used to measure the proximal tibial resection of 9 mm off the high lateral side. Care was taken during the tibial resection to protect the medial and collateral ligaments.  The resected tibial bone was removed.  The distal femur was resected using the intramedullary cutting guide.  Care was taken to protect the collateral ligaments during distal  femoral resection.  The distal femoral resection was performed with an oscillating saw. The femoral cutting guide was then removed. Extension gap was measured with a 9 mm spacer block and alignment and extension was confirmed using a long alignment rod. The femur was sized to be a 5 narrow. Rotation of the referencing guide was checked with the epicondylar axis and Whitesides line. Then the 4-in-1 cutting jig was then applied to the distal femur. A stylus was used to confirm that the anterior femur would not be notched.   Then the anterior, posterior and chamfer femoral cuts were then made with an oscillating saw.  The knee was distracted and all posterior osteophytes were removed.  The flexion gap was then measured with a flexion spacer block and long alignment rod and was found to be symmetric with the extension gap and perpendicular to mechanical axis of the tibia.  The proximal tibia plateau was then sized with trial trays. The best coverage was achieved with a size 4. This tibial tray was then pinned into position. The proximal tibia was then prepared with the keel punch.  After tibial preparation was completed, all trial components were inserted with polyethylene trials. The knee achieved full extension and flexed to 120 degrees. Ligament were stable to varus and valgus at full extension as well as 30, 60 and 90 degrees of flexion.   The trials were then placed. Knee was taken through a full range of motion and deemed to be stable with the trial components. All trial components were then removed.  The joint was copiously irrigated with pulse lavage.  The final total knee arthroplasty components were then cemented into place. The knee was held in extension while cement was allowed to cure.The knee was taken through a range of motion and the patella tracked well and the knee was again irrigated copiously.  The knee capsule was then injected with Exparel.  The medial arthrotomy was closed with #1 Vicryl  and #2 Quill. The subcutaneous tissue closed with  2-0 vicryl, and skin approximated with staples.  A dry sterile and compressive dressing was applied.  A Polar Care was applied to the operative knee.  The patient was awakened and brought to the PACU in stable and satisfactory condition.  All sharp, lap and instrument counts were correct at the conclusion the case. I spoke with the patient's family in the postop consultation room to let them know the case had been performed without complication and the patient was stable in recovery room.   Total tourniquet time was 53 minutes.

## 2022-06-08 NOTE — Progress Notes (Signed)
Notified family that patient is doing well and awaiting for assigned room to be cleaned.

## 2022-06-08 NOTE — Anesthesia Preprocedure Evaluation (Addendum)
Anesthesia Evaluation  Patient identified by MRN, date of birth, ID band Patient awake    Reviewed: Allergy & Precautions, NPO status , Patient's Chart, lab work & pertinent test results  History of Anesthesia Complications Negative for: history of anesthetic complications  Airway Mallampati: II   Neck ROM: Full    Dental  (+) Upper Dentures, Lower Dentures   Pulmonary COPD, former smoker (quit 1987),    Pulmonary exam normal breath sounds clear to auscultation       Cardiovascular hypertension, + CAD and +CHF (NICM)  + dysrhythmias (a fib on Eliquis, last dose 06/04/22)  Rhythm:Irregular Rate:Normal  ECG 05/24/22: A fib 62bpm, LAD, LBBB, no changes from prior   Neuro/Psych negative neurological ROS     GI/Hepatic negative GI ROS,   Endo/Other  Class 3 obesity  Renal/GU Renal disease (stage III CKD)     Musculoskeletal  (+) Arthritis ,   Abdominal   Peds  Hematology negative hematology ROS (+)   Anesthesia Other Findings   Reproductive/Obstetrics Cervical CA                            Anesthesia Physical Anesthesia Plan  ASA: 3  Anesthesia Plan: General and Regional   Post-op Pain Management: Regional block*   Induction: Intravenous  PONV Risk Score and Plan: 3 and Treatment may vary due to age or medical condition and Ondansetron  Airway Management Planned: Oral ETT  Additional Equipment:   Intra-op Plan:   Post-operative Plan: Extubation in OR  Informed Consent: I have reviewed the patients History and Physical, chart, labs and discussed the procedure including the risks, benefits and alternatives for the proposed anesthesia with the patient or authorized representative who has indicated his/her understanding and acceptance.     Dental advisory given  Plan Discussed with: CRNA  Anesthesia Plan Comments: (Plan for preoperative adductor saphenous nerve block and GETA.   Patient consented for risks of anesthesia including but not limited to:  - adverse reactions to medications - damage to eyes, teeth, lips or other oral mucosa - nerve damage due to positioning  - sore throat or hoarseness - headache, bleeding, infection, nerve damage 2/2 spinal - damage to heart, brain, nerves, lungs, other parts of body or loss of life  Informed patient about role of CRNA in peri- and intra-operative care.  Patient voiced understanding.)       Anesthesia Quick Evaluation

## 2022-06-08 NOTE — Assessment & Plan Note (Signed)
-   Holding home carvedilol 6.25 mg p.o. evening dose, a.m. team to resume if patient does not experience symptomatic bradycardia overnight - Metoprolol 5 mg IV every 3 hours as needed for heart rate greater than 120, 4 doses ordered - Agree with resuming apixaban 5 mg p.o. twice daily

## 2022-06-08 NOTE — Progress Notes (Signed)
Notified Dr. Danne Baxter of patient BP 100/43 (56)

## 2022-06-08 NOTE — Anesthesia Procedure Notes (Signed)
Anesthesia Regional Block: Adductor canal block   Pre-Anesthetic Checklist: , timeout performed,  Correct Patient, Correct Site, Correct Laterality,  Correct Procedure,, site marked,  Risks and benefits discussed,  Surgical consent,  Pre-op evaluation,  At surgeon's request and post-op pain management  Laterality: Left  Prep: chloraprep       Needles:  Injection technique: Single-shot  Needle Type: Echogenic Needle          Additional Needles:   Procedures:,,,, ultrasound used (permanent image in chart),,   Motor weakness within 20 minutes.  Narrative:  Start time: 06/08/2022 12:03 PM End time: 06/08/2022 12:04 PM Injection made incrementally with aspirations every 5 mL.  Performed by: Personally  Anesthesiologist: Darrin Nipper, MD  Additional Notes: Functioning IV was confirmed and monitors applied.  Sterile prep and drape, hand hygiene and sterile gloves were used. Ultrasound guidance: relevant anatomy identified, needle position confirmed, local anesthetic spread visualized around nerve(s), vascular puncture avoided.  Image saved to electronic medical record.  Negative aspiration prior to incremental administration of local anesthetic for total 20 ml bupivacaine 0.5% given in adductor saphenous distribution. The patient tolerated the procedure well. Vital signs and moderate sedation medications recorded in RN notes.

## 2022-06-08 NOTE — Assessment & Plan Note (Addendum)
-   Per discussion with anesthesiologist, post total knee replacements, patient was recovering in PACU and did not appear to be in acute distress when she developed bradycardia with first AV nodal block - Patient was given ephedrine 50 mg IV and glycopyrrolate - At bedside patient is currently asymptomatic -  EKG with notes from nursing to upload into epic - Agree with admission to progressive, observation, telemetry - Would recommend holding carvedilol p.m. dosing at this time - Portable chest x-ray stat - CMP, CBC, high sensitive troponin, BNP - If the above work-up is unremarkable would recommend team to consider sending patient home with Holter monitor appropriate follow-up with her cardiologist, [facility]

## 2022-06-08 NOTE — Progress Notes (Signed)
Updated son Kimberly Henry, that patient is doing great, and that she is doing great. Explained rationale for going to Cardiac unit and how she did in recovery. VSS. Pain 0/10

## 2022-06-08 NOTE — Anesthesia Procedure Notes (Signed)
Procedure Name: Intubation Date/Time: 06/08/2022 12:20 PM  Performed by: Esaw Grandchild, CRNAPre-anesthesia Checklist: Patient identified, Emergency Drugs available, Suction available and Patient being monitored Patient Re-evaluated:Patient Re-evaluated prior to induction Oxygen Delivery Method: Circle system utilized Preoxygenation: Pre-oxygenation with 100% oxygen Induction Type: IV induction Ventilation: Mask ventilation without difficulty and Oral airway inserted - appropriate to patient size Laryngoscope Size: McGraph and 3 Grade View: Grade I Tube type: Oral Tube size: 7.0 mm Number of attempts: 1 Airway Equipment and Method: Stylet, Oral airway, LTA kit utilized and Bite block Placement Confirmation: ETT inserted through vocal cords under direct vision, positive ETCO2 and breath sounds checked- equal and bilateral Secured at: 21 cm Tube secured with: Tape Dental Injury: Teeth and Oropharynx as per pre-operative assessment

## 2022-06-08 NOTE — Progress Notes (Signed)
Patient came out to PACU at 1415 patient is in AFIB and AV block. Patient has a history of both. Heart rate dropped to 26. Called Dr. Erenest Rasher stat. Patient experienced no mental statis Changed vital sign cycle on monitor to q5 min and pads placed on patient to be safe. During event 15 ephedrine was administered and glycopyrrolate 0.2 mg IV. Will continue to monitor patient closely patient heart rate is remaining in the high 50's-60's at this time

## 2022-06-08 NOTE — Transfer of Care (Signed)
Immediate Anesthesia Transfer of Care Note  Patient: Kimberly Henry  Procedure(s) Performed: TOTAL KNEE ARTHROPLASTY (Left: Knee)  Patient Location: PACU  Anesthesia Type:General and Regional  Level of Consciousness: awake, alert  and oriented  Airway & Oxygen Therapy: Patient Spontanous Breathing and Patient connected to face mask oxygen  Post-op Assessment: Report given to RN, Post -op Vital signs reviewed and stable and Patient moving all extremities  Post vital signs: Reviewed and stable  Last Vitals:  Vitals Value Taken Time  BP 92/50 06/08/22 1415  Temp    Pulse 39 06/08/22 1418  Resp 11 06/08/22 1418  SpO2 99 % 06/08/22 1418  Vitals shown include unvalidated device data.  Last Pain:  Vitals:   06/08/22 1021  TempSrc: Temporal  PainSc: 0-No pain      Patients Stated Pain Goal: 0 (69/62/95 2841)  Complications: No notable events documented.

## 2022-06-08 NOTE — Assessment & Plan Note (Signed)
-   Patient has BMI of 50. This complicates overall care and prognosis.

## 2022-06-08 NOTE — Assessment & Plan Note (Signed)
-   Rosuvastatin 40 mg

## 2022-06-08 NOTE — H&P (Signed)
The patient has been re-examined, and the chart reviewed, and there have been no interval changes to the documented history and physical.  Plan a left total knee today.  Anesthesia is consulted regarding a peripheral nerve block for post-operative pain.  The risks, benefits, and alternatives have been discussed at length, and the patient is willing to proceed.     

## 2022-06-09 ENCOUNTER — Encounter: Payer: Self-pay | Admitting: Orthopedic Surgery

## 2022-06-09 DIAGNOSIS — I2510 Atherosclerotic heart disease of native coronary artery without angina pectoris: Secondary | ICD-10-CM | POA: Diagnosis not present

## 2022-06-09 DIAGNOSIS — R001 Bradycardia, unspecified: Secondary | ICD-10-CM | POA: Diagnosis not present

## 2022-06-09 DIAGNOSIS — Z87891 Personal history of nicotine dependence: Secondary | ICD-10-CM | POA: Diagnosis not present

## 2022-06-09 DIAGNOSIS — Z79899 Other long term (current) drug therapy: Secondary | ICD-10-CM | POA: Diagnosis not present

## 2022-06-09 DIAGNOSIS — J449 Chronic obstructive pulmonary disease, unspecified: Secondary | ICD-10-CM | POA: Diagnosis not present

## 2022-06-09 DIAGNOSIS — M1712 Unilateral primary osteoarthritis, left knee: Secondary | ICD-10-CM | POA: Diagnosis not present

## 2022-06-09 DIAGNOSIS — I482 Chronic atrial fibrillation, unspecified: Secondary | ICD-10-CM | POA: Diagnosis not present

## 2022-06-09 DIAGNOSIS — I5022 Chronic systolic (congestive) heart failure: Secondary | ICD-10-CM | POA: Diagnosis not present

## 2022-06-09 DIAGNOSIS — Z96651 Presence of right artificial knee joint: Secondary | ICD-10-CM | POA: Diagnosis not present

## 2022-06-09 DIAGNOSIS — I110 Hypertensive heart disease with heart failure: Secondary | ICD-10-CM | POA: Diagnosis not present

## 2022-06-09 DIAGNOSIS — Z7901 Long term (current) use of anticoagulants: Secondary | ICD-10-CM | POA: Diagnosis not present

## 2022-06-09 DIAGNOSIS — I440 Atrioventricular block, first degree: Secondary | ICD-10-CM | POA: Diagnosis not present

## 2022-06-09 MED ORDER — HYDROCODONE-ACETAMINOPHEN 5-325 MG PO TABS: 1.0000 | ORAL_TABLET | ORAL | 0 refills | Status: DC | PRN

## 2022-06-09 MED ORDER — BISACODYL 10 MG RE SUPP: 10.0000 mg | Freq: Every day | RECTAL | 0 refills | Status: DC | PRN

## 2022-06-09 NOTE — TOC Transition Note (Signed)
Transition of Care [facility]) - CM/SW Discharge Note   Patient Details  Name: Kimberly Henry MRN: [id number] Date of Birth: 06-Nov-1940  Transition of Care [facility]) CM/SW Contact:  Candie Chroman, LCSW Phone Number: 06/09/2022, 3:19 PM   Clinical Narrative: Patient has orders to discharge home today. [facility] has accepted referral for home health PT. Patient and son are aware and agreeable. No further concerns. CSW signing off.    Final next level of care: Home w Home Health Services Barriers to Discharge: Barriers Resolved   Patient Goals and CMS Choice   CMS Medicare.gov Compare Post Acute Care list provided to:: Patient Choice offered to / list presented to : Patient, Adult Children  Discharge Placement                Patient to be transferred to facility by: Son Name of family member notified: Thornell Sartorius Patient and family notified of of transfer: 06/09/22  Discharge Plan and Services     Post Acute Care Choice: [place] Arranged: PT [place]: [place] Date [place]: 06/09/22   Representative spoke with at [place]: [place]  Social Determinants of Health (SDOH) Interventions     Readmission Risk Interventions     No data to display

## 2022-06-09 NOTE — Progress Notes (Signed)
PROGRESS NOTE    Kimberly Henry  XVQ:[id number] DOB: 30-Dec-1940 DOA: 06/08/2022  PCP: Steele Sizer, MD   Brief Narrative:  This 81 yrs old female with past medical history significant of morbid obesity, hypertension, first AV nodal block, bilateral knee osteoarthritis, chronic atrial fibrillation, nonischemic cardiomyopathy, heart failure reduced ejection fraction, COPD, who presents for an outpatient total knee replacement.  Patient became bradycardic after the procedure.  Medical consult was requested.  Patient does not appear to be in any acute distress.  Coreg was held.  Work-up so far unremarkable.  Heart rate has improved.  Patient is being discharged as per Ortho.  Assessment & Plan:   Principal Problem:   Bradycardia Active Problems:   Dyslipidemia   1st degree AV block   Morbid obesity with BMI of 40.0-44.9, adult (HCC)   NICM (nonischemic cardiomyopathy) (HCC)   Chronic a-fib (HCC)   S/P TKR (total knee replacement) using cement, left   Status post knee replacement   Atrial fibrillation with bradycardia: Patient post procedure became bradycardic. EKG shows atrial fibrillation with rate 60, LBBB which is unchanged from prior EKG. Advised to hold Coreg evening dose. Patient remains asymptomatic throughout the night. Consider metoprolol 5 mg IV push every 3 hours for heart rate above 120. Heart rate remains stable. Continue Eliquis 5 mg twice daily. Recommended Holter monitoring and follow-up outpatient cardiologist.  Morbid obesity: Diet and exercise discussed in detail  Hyperlipidemia: Continue rosuvastatin 40 mg daily  S/p Left total knee arthroplasty: Follow-up Ortho recommendation  We are signing off.  Reconsult if needed  Procedures: Total knee arthroplasty Antimicrobials: None   Subjective: Patient was seen and examined at bedside.  Overnight events noted.   Patient reports feeling better denies any dizziness palpitation or shortness of breath.    Heart rate remains stable.  S/p total knee arthroplasty left knee. POD 1  Objective: Vitals:   06/09/22 0736 06/09/22 1133 06/09/22 1134 06/09/22 1201  BP: (!) 130/52 (!) 131/47 (!) 131/47 (!) 127/55  Pulse: 74 (!) 59 (!) 59 65  Resp: 18  (!) 22 18  Temp: 98.3 F (36.8 C) 98.6 F (37 C) 98.6 F (37 C) 98.1 F (36.7 C)  TempSrc: Oral Oral Oral Oral  SpO2: 99% 100% 100% 98%  Weight:      Height:        Intake/Output Summary (Last 24 hours) at 06/09/2022 1540 Last data filed at 06/09/2022 1100 Gross per 24 hour  Intake 1057.75 ml  Output --  Net 1057.75 ml   Filed Weights   06/08/22 1021  Weight: 128.5 kg    Examination:  General exam: Appears comfortable, not in any acute distress. Respiratory system: Clear to auscultation. Respiratory effort normal. Cardiovascular system: S1 & S2 heard, irregular rhythm, no murmur.   Gastrointestinal system: Abdomen is soft, nontender, nondistended, BS + Central nervous system: Alert and oriented x3. No focal neurological deficits. Extremities: Left knee in immobilizer, tenderness noted. Skin: No rashes, lesions or ulcers Psychiatry: Judgement and insight appear normal. Mood & affect appropriate.     Data Reviewed: I have personally reviewed following labs and imaging studies  CBC: Recent Labs  Lab 06/08/22 1618  WBC 6.8  NEUTROABS 5.6  HGB 12.1  HCT 37.4  MCV 95.9  PLT 093   Basic Metabolic Panel: Recent Labs  Lab 06/08/22 1618  NA 141  K 4.1  CL 108  CO2 26  GLUCOSE 121*  BUN 12  CREATININE 0.95  CALCIUM 9.2  GFR: Estimated Creatinine Clearance (by C-G formula based on SCr of 0.95 mg/dL) Female: 60.7 mL/min Female: 73.8 mL/min Liver Function Tests: Recent Labs  Lab 06/08/22 1618  AST 25  ALT 21  ALKPHOS 94  BILITOT 0.9  PROT 7.4  ALBUMIN 4.0   No results for input(s): "LIPASE", "AMYLASE" in the last 168 hours. No results for input(s): "AMMONIA" in the last 168 hours. Coagulation Profile: No  results for input(s): "INR", "PROTIME" in the last 168 hours. Cardiac Enzymes: No results for input(s): "CKTOTAL", "CKMB", "CKMBINDEX", "TROPONINI" in the last 168 hours. BNP (last 3 results) No results for input(s): "PROBNP" in the last 8760 hours. HbA1C: No results for input(s): "HGBA1C" in the last 72 hours. CBG: No results for input(s): "GLUCAP" in the last 168 hours. Lipid Profile: No results for input(s): "CHOL", "HDL", "LDLCALC", "TRIG", "CHOLHDL", "LDLDIRECT" in the last 72 hours. Thyroid Function Tests: No results for input(s): "TSH", "T4TOTAL", "FREET4", "T3FREE", "THYROIDAB" in the last 72 hours. Anemia Panel: No results for input(s): "VITAMINB12", "FOLATE", "FERRITIN", "TIBC", "IRON", "RETICCTPCT" in the last 72 hours. Sepsis Labs: No results for input(s): "PROCALCITON", "LATICACIDVEN" in the last 168 hours.  No results found for this or any previous visit (from the past 240 hour(s)).   Radiology Studies: DG Chest Port 1 View  Result Date: 06/08/2022 CLINICAL DATA:  Dyspnea.  Postop knee surgery EXAM: PORTABLE CHEST 1 VIEW COMPARISON:  Chest 04/19/2017 FINDINGS: Cardiac enlargement without heart failure. Lungs clear without infiltrate or effusion. Large well-circumscribed density overlying the lower heart. No hiatal hernia seen on the CT from 01/21/2020. Possible object outside of the patient such as defibrillator pad. IMPRESSION: No acute cardiopulmonary abnormality Soft tissue density in the lower mediastinum of uncertain etiology. Possibly outside of the patient. Electronically Signed   By: Franchot Gallo M.D.   On: 06/08/2022 16:23   [place] OR NERVE BLOCK-IMAGE ONLY [facility])  Result Date: 06/08/2022 There is no interpretation for this exam.  This order is for images obtained during a surgical procedure.  Please See "Surgeries" Tab for more information regarding the procedure.     Scheduled Meds:  apixaban  5 mg Oral BID   carvedilol  6.25 mg Oral BID WC   docusate sodium   100 mg Oral BID   loratadine  10 mg Oral Daily   potassium chloride SA  20 mEq Oral Daily   rosuvastatin  40 mg Oral Daily   sacubitril-valsartan  1 tablet Oral BID   spironolactone  25 mg Oral Daily   Continuous Infusions:  lactated ringers Stopped (06/09/22 0700)     LOS: 0 days    Time spent: 35 mins    James Senn, MD [facility]   If 7PM-7AM, please contact night-coverage

## 2022-06-09 NOTE — Plan of Care (Signed)
  Problem: Pain Management: Goal: Pain level will decrease with appropriate interventions Outcome: Progressing   

## 2022-06-09 NOTE — Discharge Instructions (Signed)
Continue weight bear as tolerated on the left lower extremity.    Elevate the left lower extremity whenever possible and continue the polar care while elevating the extremity. Patient may shower. No bath or submerging the wound.    Take eliquis as directed for blood clot prevention.  Continue to work on knee range of motion exercises at home as instructed by physical therapy. Continue to use a walker for assistance with ambulation until cleared by physical therapy.  Call [phone] with any questions, such as fever > 101.5 degrees, drainage from the wound or shortness of breath.

## 2022-06-09 NOTE — Discharge Summary (Signed)
Physician Discharge Summary  Patient ID: Kimberly Henry MRN: [id number] DOB/AGE: 82/09/1940 82 y.o.  Admit date: 06/08/2022 Discharge date: 06/09/2022  Admission Diagnoses:  M17.12 Unilateral primary osteoarthritis, left knee Bradycardia  Discharge Diagnoses:  M17.12 Unilateral primary osteoarthritis, left knee Principal Problem:   Bradycardia Active Problems:   Dyslipidemia   1st degree AV block   Morbid obesity with BMI of 40.0-44.9, adult (HCC)   NICM (nonischemic cardiomyopathy) (HCC)   Chronic a-fib (HCC)   S/P TKR (total knee replacement) using cement, left   Status post knee replacement   Past Medical History:  Diagnosis Date   Acid phosphatase elevated    Aortic atherosclerosis (HCC)    Atrial fibrillation (HCC)    a.) CHA2DS2VASc = 6 (age x 2, sex, HFrEF, HTN, vascular disease history);  b.) rate/rhythm maintained on oral carvedilol; chronically anticoagulated with apixaban   AV block, 1st degree    Cervical cancer ([place]) 1999   COPD (chronic obstructive pulmonary disease) ([place])    HFrEF (heart failure with reduced ejection fraction) ([place])    a.) 04/2017 Echo: EF 30-35%, antsept HK, mild MR, mod dil LA, mildly dil RA, mod TR, mildly to mod elev PASP; b.) 08/2017 Echo: EF 30-35%, diff HK, antsept HK. Gr1 DD. Mild to mod MR. PASP nl; c.) TTE 01/22/2020: EF 50-55%, mild LAE, triv MR; RVSP 38.3   Hx of cervical malignancy    Hyperlipidemia    Hypertension    LBBB (left bundle branch block)    Long term current use of anticoagulant    a.) apixaban   Metabolic syndrome    NICM (nonischemic cardiomyopathy) ([place])    a.) 04/2017 Echo: EF 30-35%; b.) 04/2017 Cath: nonobs dzs; c.) 08/2017 Echo: EF 30-35%, diff HK, antsept HK. Gr1 DD. Mild to mod MR. PASP nl; d.) TTE 01/22/2020: EF 50-55%, mild LAE, triv MR, RVSP 38.3   Non-obstructive CAD (coronary artery disease)    a. 04/2017 Cath: mild nonobs CAD. EF 25-35%. CO 3.14, CI 1.38. Sev PAH [61/34(47].   Obesity, Class III, BMI  40-49.9 (morbid obesity) (HCC)    Osteoarthritis of both knees    PAH (pulmonary arterial hypertension) with portal hypertension ([place])    a.) 04/2017 Right Heart Cath: Sev PAH 61/34(47); b.) 08/2017 Echo: nl PASP; c.) TTE 01/22/2020: RVSP 38.3    Surgeries: Procedure(s): TOTAL KNEE ARTHROPLASTY on 06/08/2022   Consultants (if any): Treatment Team:  Shawna Clamp, MD  Discharged Condition: Improved  Hospital Course: Kimberly Henry is an 81 y.o. adult who was admitted 06/08/2022 with a diagnosis of  M17.12 Unilateral primary osteoarthritis, left knee Bradycardia and went to the operating room on 06/08/2022 and underwent the above named procedures.    She was given perioperative antibiotics:  Anti-infectives (From admission, onward)    Start     Dose/Rate Route Frequency Ordered Stop   06/08/22 1800  ceFAZolin (ANCEF) IVPB 2g/100 mL premix        2 g 200 mL/hr over 30 Minutes Intravenous Every 6 hours 06/08/22 1653 06/09/22 0055   06/08/22 1025  ceFAZolin (ANCEF) 2-4 GM/100ML-% IVPB       Note to Pharmacy: [place] [facility], [place]: cabinet override      06/08/22 1025 06/08/22 1237   06/08/22 0600  ceFAZolin (ANCEF) IVPB 2g/100 mL premix        2 g 200 mL/hr over 30 Minutes Intravenous On call to O.R. 06/07/22 2221 06/08/22 1235     .  She was given sequential compression devices,  early ambulation, and Eliquis for DVT prophylaxis.  She benefited maximally from the hospital stay and there were no complications.    Recent vital signs:  Vitals:   06/09/22 1134 06/09/22 1201  BP: (!) 131/47 (!) 127/55  Pulse: (!) 59 65  Resp: (!) 22   Temp: 98.6 F (37 C) 98.1 F (36.7 C)  SpO2: 100% 98%    Recent laboratory studies:  Lab Results  Component Value Date   HGB 12.1 06/08/2022   HGB 11.6 (L) 05/27/2022   HGB 12.1 12/03/2021   Lab Results  Component Value Date   WBC 6.8 06/08/2022   PLT 231 06/08/2022   Lab Results  Component Value Date   INR 1.1 07/02/2020   Lab Results   Component Value Date   NA 141 06/08/2022   K 4.1 06/08/2022   CL 108 06/08/2022   CO2 26 06/08/2022   BUN 12 06/08/2022   CREATININE 0.95 06/08/2022   GLUCOSE 121 (H) 06/08/2022    Discharge Medications:   Allergies as of 06/09/2022   No Known Allergies      Medication List     STOP taking these medications    acetaminophen 500 MG tablet Commonly known as: TYLENOL       TAKE these medications    bisacodyl 10 MG suppository Commonly known as: DULCOLAX Place 1 suppository (10 mg total) rectally daily as needed for moderate constipation.   carvedilol 6.25 MG tablet Commonly known as: COREG TAKE 1 TABLET BY MOUTH TWICE  DAILY WITH MEALS   Eliquis 5 MG Tabs tablet Generic drug: apixaban TAKE 1 TABLET BY MOUTH  TWICE DAILY   Entresto 97-103 MG Generic drug: sacubitril-valsartan TAKE 1 TABLET BY MOUTH TWICE  DAILY   HYDROcodone-acetaminophen 5-325 MG tablet Commonly known as: NORCO/VICODIN Take 1 tablet by mouth every 4 (four) hours as needed for moderate pain (pain score 4-6).   levocetirizine 5 MG tablet Commonly known as: XYZAL TAKE 1 TABLET BY MOUTH IN  THE EVENING   multivitamin with minerals Tabs tablet Take 1 tablet by mouth daily.   potassium chloride SA 20 MEQ tablet Commonly known as: KLOR-CON M Take 1 tablet (20 mEq total) by mouth daily.   rosuvastatin 40 MG tablet Commonly known as: CRESTOR TAKE 1 TABLET BY MOUTH DAILY   spironolactone 25 MG tablet Commonly known as: ALDACTONE TAKE 1 TABLET BY MOUTH DAILY        Diagnostic Studies: DG Chest Port 1 View  Result Date: 06/08/2022 CLINICAL DATA:  Dyspnea.  Postop knee surgery EXAM: PORTABLE CHEST 1 VIEW COMPARISON:  Chest 04/19/2017 FINDINGS: Cardiac enlargement without heart failure. Lungs clear without infiltrate or effusion. Large well-circumscribed density overlying the lower heart. No hiatal hernia seen on the CT from 01/21/2020. Possible object outside of the patient such as  defibrillator pad. IMPRESSION: No acute cardiopulmonary abnormality Soft tissue density in the lower mediastinum of uncertain etiology. Possibly outside of the patient. Electronically Signed   By: Franchot Gallo M.D.   On: 06/08/2022 16:23   [place] OR NERVE BLOCK-IMAGE ONLY [facility])  Result Date: 06/08/2022 There is no interpretation for this exam.  This order is for images obtained during a surgical procedure.  Please See "Surgeries" Tab for more information regarding the procedure.    Disposition: Discharge disposition: 01-Home or Self Care            Signed: Carlynn Spry ,PA-C 06/09/2022, 1:32 PM

## 2022-06-09 NOTE — Progress Notes (Signed)
Subjective:  Patient reports pain as mild.  Patient's HR dropped after surgery and was placed in observation.  Did well overnight as well as this morning with no complaints.  Patient did not participate in PT yesterday.  Objective:   VITALS:   Vitals:   06/08/22 1951 06/09/22 0108 06/09/22 0412 06/09/22 0736  BP: (!) 123/55 (!) 113/51 (!) 131/44 (!) 130/52  Pulse: 70 (!) 50 60 74  Resp: '20 18 19 18  ' Temp: (!) 97.5 F (36.4 C) (!) 97.5 F (36.4 C) (!) 97.5 F (36.4 C) 98.3 F (36.8 C)  TempSrc:   Oral   SpO2: 100% 98% 99% 99%  Weight:      Height:        PHYSICAL EXAM:  Neurologically intact ABD soft Neurovascular intact Sensation intact distally Intact pulses distally Dorsiflexion/Plantar flexion intact Incision: dressing C/D/I and drsg changed No cellulitis present Compartment soft  LABS  Results for orders placed or performed during the hospital encounter of 06/08/22 (from the past 24 hour(s))  Comprehensive metabolic panel     Status: Abnormal   Collection Time: 06/08/22  4:18 PM  Result Value Ref Range   Sodium 141 135 - 145 mmol/L   Potassium 4.1 3.5 - 5.1 mmol/L   Chloride 108 98 - 111 mmol/L   CO2 26 22 - 32 mmol/L   Glucose, Bld 121 (H) 70 - 99 mg/dL   BUN 12 8 - 23 mg/dL   Creatinine, Ser 0.95 0.44 - 1.00 mg/dL   Calcium 9.2 8.9 - 10.3 mg/dL   Total Protein 7.4 6.5 - 8.1 g/dL   Albumin 4.0 3.5 - 5.0 g/dL   AST 25 15 - 41 U/L   ALT 21 0 - 44 U/L   Alkaline Phosphatase 94 38 - 126 U/L   Total Bilirubin 0.9 0.3 - 1.2 mg/dL   GFR, Estimated >60 >60 mL/min   Anion gap 7 5 - 15  CBC with Differential/Platelet     Status: None   Collection Time: 06/08/22  4:18 PM  Result Value Ref Range   WBC 6.8 4.0 - 10.5 K/uL   RBC 3.90 3.87 - 5.11 MIL/uL   Hemoglobin 12.1 12.0 - 15.0 g/dL   HCT 37.4 36.0 - 46.0 %   MCV 95.9 80.0 - 100.0 fL   MCH 31.0 26.0 - 34.0 pg   MCHC 32.4 30.0 - 36.0 g/dL   RDW 11.9 11.5 - 15.5 %   Platelets 231 150 - 400 K/uL   nRBC  0.0 0.0 - 0.2 %   Neutrophils Relative % 82 %   Neutro Abs 5.6 1.7 - 7.7 K/uL   Lymphocytes Relative 16 %   Lymphs Abs 1.1 0.7 - 4.0 K/uL   Monocytes Relative 1 %   Monocytes Absolute 0.1 0.1 - 1.0 K/uL   Eosinophils Relative 1 %   Eosinophils Absolute 0.0 0.0 - 0.5 K/uL   Basophils Relative 0 %   Basophils Absolute 0.0 0.0 - 0.1 K/uL   Immature Granulocytes 0 %   Abs Immature Granulocytes 0.01 0.00 - 0.07 K/uL  Troponin I (High Sensitivity)     Status: None   Collection Time: 06/08/22  4:18 PM  Result Value Ref Range   Troponin I (High Sensitivity) 11 <18 ng/L  Brain natriuretic peptide     Status: Abnormal   Collection Time: 06/08/22  4:18 PM  Result Value Ref Range   B Natriuretic Peptide 290.7 (H) 0.0 - 100.0 pg/mL  Troponin I (High Sensitivity)  Status: None   Collection Time: 06/08/22  5:57 PM  Result Value Ref Range   Troponin I (High Sensitivity) 11 <18 ng/L    DG Chest Port 1 View  Result Date: 06/08/2022 CLINICAL DATA:  Dyspnea.  Postop knee surgery EXAM: PORTABLE CHEST 1 VIEW COMPARISON:  Chest 04/19/2017 FINDINGS: Cardiac enlargement without heart failure. Lungs clear without infiltrate or effusion. Large well-circumscribed density overlying the lower heart. No hiatal hernia seen on the CT from 01/21/2020. Possible object outside of the patient such as defibrillator pad. IMPRESSION: No acute cardiopulmonary abnormality Soft tissue density in the lower mediastinum of uncertain etiology. Possibly outside of the patient. Electronically Signed   By: Franchot Gallo M.D.   On: 06/08/2022 16:23   [place] OR NERVE BLOCK-IMAGE ONLY [facility])  Result Date: 06/08/2022 There is no interpretation for this exam.  This order is for images obtained during a surgical procedure.  Please See "Surgeries" Tab for more information regarding the procedure.    Assessment/Plan: 1 Day Post-Op   Principal Problem:   Bradycardia Active Problems:   Dyslipidemia   1st degree AV block   Morbid  obesity with BMI of 40.0-44.9, adult (HCC)   NICM (nonischemic cardiomyopathy) (HCC)   Chronic a-fib (HCC)   S/P TKR (total knee replacement) using cement, left   Status post knee replacement   Advance diet Up with therapy Continue to monitor Hopefully discharge home tomorrow after PT goals met.   Carlynn Spry , Pa-C 06/09/2022, 7:51 AM

## 2022-06-09 NOTE — Anesthesia Postprocedure Evaluation (Signed)
Anesthesia Post Note  Patient: Kimberly Henry  Procedure(s) Performed: TOTAL KNEE ARTHROPLASTY (Left: Knee)  Patient location during evaluation: PACU Anesthesia Type: General Level of consciousness: awake and alert, oriented and patient cooperative Pain management: pain level controlled Vital Signs Assessment: post-procedure vital signs reviewed and stable Respiratory status: spontaneous breathing, nonlabored ventilation and respiratory function stable Cardiovascular status: blood pressure returned to baseline and stable Postop Assessment: adequate PO intake Anesthetic complications: no Comments: Postop asymptomatic bradycardia in PACU, resolved with glycopyrrolate and ephedrine.  Pt to be admitted with telemetry monitoring, hospitalist aware.   No notable events documented.   Last Vitals:  Vitals:   06/09/22 0108 06/09/22 0412  BP: (!) 113/51 (!) 131/44  Pulse: (!) 50 60  Resp: 18 19  Temp: (!) 36.4 C (!) 36.4 C  SpO2: 98% 99%    Last Pain:  Vitals:   06/09/22 0412  TempSrc: Oral  PainSc:                  Darrin Nipper

## 2022-06-09 NOTE — Progress Notes (Signed)
PT Cancellation Note  Patient Details Name: NICKOLA LENIG MRN: [id number] DOB: Jun 07, 1941   Cancelled Treatment:    Reason Eval/Treat Not Completed: Patient declined, no reason specified. Patient states she is going home. Discharge order in. She declines further ambulation at this time, no concerns regarding going home. Spoke with TOC about getting HHPT set up prior to discharge.    Jeannine Pennisi 06/09/2022, 2:37 PM

## 2022-06-09 NOTE — TOC Initial Note (Signed)
Transition of Care [facility]) - Initial/Assessment Note    Patient Details  Name: Kimberly Henry MRN: [id number] Date of Birth: [birth year]  Transition of Care [facility]) CM/SW Contact:    Candie Chroman, LCSW Phone Number: 06/09/2022, 2:41 PM  Clinical Narrative:  CSW met with patient. No supports at bedside. CSW introduced role and explained that PT recommendations would be discussed. Patient is agreeable to home health services. No agency preference. Starting search. No DME recommendations.                Expected Discharge Plan: [place] Barriers to Discharge:  (Setting up home health)   Patient Goals and CMS Choice   CMS Medicare.gov Compare Post Acute Care list provided to:: Patient    Expected Discharge Plan and Services Expected Discharge Plan: [place] Choice: [place] arrangements for the past 2 months: Single Family Home Expected Discharge Date: 06/09/22                                    Prior Living Arrangements/Services Living arrangements for the past 2 months: Single Family Home Lives with:: Adult Children Patient language and need for interpreter reviewed:: Yes Do you feel safe going back to the place where you live?: Yes      Need for Family Participation in Patient Care: Yes (Comment) Care giver support system in place?: Yes (comment)   Criminal Activity/Legal Involvement Pertinent to Current Situation/Hospitalization: No - Comment as needed  Activities of Daily Living Home Assistive Devices/Equipment: Eyeglasses ADL Screening (condition at time of admission) Patient's cognitive ability adequate to safely complete daily activities?: Yes Is the patient deaf or have difficulty hearing?: No Does the patient have difficulty seeing, even when wearing glasses/contacts?: No Does the patient have difficulty concentrating, remembering, or making decisions?: No Patient able to express need for  assistance with ADLs?: Yes Does the patient have difficulty dressing or bathing?: No Independently performs ADLs?: Yes (appropriate for developmental age) Does the patient have difficulty walking or climbing stairs?: Yes Weakness of Legs: Left Weakness of Arms/Hands: None  Permission Sought/Granted Permission sought to share information with : Facility Art therapist granted to share information with : Yes, Verbal Permission Granted     Permission granted to share info w AGENCY: Home Health Agencies        Emotional Assessment Appearance:: Appears stated age Attitude/Demeanor/Rapport: Engaged, Gracious Affect (typically observed): Accepting, Appropriate, Calm, Pleasant Orientation: : Oriented to Self, Oriented to Place, Oriented to  Time, Oriented to Situation Alcohol / Substance Use: Not Applicable Psych Involvement: No (comment)  Admission diagnosis:  S/P TKR (total knee replacement) using cement, left [Z96.652] Status post knee replacement [Z96.659] Patient Active Problem List   Diagnosis Date Noted   S/P TKR (total knee replacement) using cement, left 06/08/2022   Status post knee replacement 06/08/2022   Stage 3a chronic kidney disease ([place]) 02/26/2022   Arthritis of left knee 06/09/2021   S/P TKR (total knee replacement) using cement, right 07/02/2020   Simple chronic bronchitis (HCC)    CAD (coronary artery disease)    Chronic a-fib (HCC)    Bradycardia    Lung nodule seen on imaging study 01/09/2020   NICM (nonischemic cardiomyopathy) ([place]) 01/09/2020   Morbid obesity with BMI of 40.0-44.9, adult ([place]) 31/51/7616   Chronic systolic heart failure ([place]) 04/28/2017  Aortic atherosclerosis (HCC) 03/10/2017   Perennial allergic rhinitis with seasonal variation 05/30/2015   1st degree AV block 43/73/5789   Metabolic syndrome 78/47/8412   History of cervical cancer 03/03/2015   Hypertension, benign 03/03/2015   Osteoarthritis, knee 03/03/2015    Dyslipidemia 03/03/2015   PCP:  Steele Sizer, MD Pharmacy:   [facility] ([place], [place] [facility] [place] [address] [place] [address] Phone: [phone] Fax: [phone]  [facility] [place], [place] - [address] [place] [address] Phone: [phone] Fax: [place], [place] - [place] [facility] [address] [place] [address] Phone: [phone] Fax: [phone]  [facility] ([facility]) - [place], [place] [address] Phone: [phone] Fax: [phone]     Social Determinants of Health (SDOH) Interventions    Readmission Risk Interventions     No data to display

## 2022-06-09 NOTE — Evaluation (Signed)
Physical Therapy Evaluation Patient Details Name: Kimberly Henry MRN: [id number] DOB: 1941-01-31 Today's Date: 06/09/2022  History of Present Illness  Patient s/p L TKA 06/08/22. History of R TKA 2 years ago  Clinical Impression  Patient received in bed, nursing student present in room. Patient is ready for PT and reports no pain. She is mod independent with bed mobility. Transfers with min guard and ambulated 120 feet with RW and min guard. Patient doing well and should progress nicely. She will continue to benefit from skilled PT to improve functional independence, strength and endurance.        Recommendations for follow up therapy are one component of a multi-disciplinary discharge planning process, led by the attending physician.  Recommendations may be updated based on patient status, additional functional criteria and insurance authorization.  Follow Up Recommendations Follow physician's recommendations for discharge plan and follow up therapies      Assistance Recommended at Discharge    Patient can return home with the following  A little help with walking and/or transfers;A little help with bathing/dressing/bathroom;Help with stairs or ramp for entrance;Assist for transportation;Assistance with cooking/housework    Equipment Recommendations None recommended by PT  Recommendations for Other Services       Functional Status Assessment Patient has had a recent decline in their functional status and demonstrates the ability to make significant improvements in function in a reasonable and predictable amount of time.     Precautions / Restrictions Precautions Precautions: Fall Restrictions Weight Bearing Restrictions: Yes LLE Weight Bearing: Weight bearing as tolerated      Mobility  Bed Mobility Overal bed mobility: Modified Independent                  Transfers Overall transfer level: Needs assistance Equipment used: Rolling walker (2 wheels) Transfers: Sit  to/from Stand Sit to Stand: Supervision, From elevated surface           General transfer comment: slightly elevated bed for ease    Ambulation/Gait Ambulation/Gait assistance: Min guard Gait Distance (Feet): 120 Feet Assistive device: Rolling walker (2 wheels) Gait Pattern/deviations: Step-to pattern, Decreased step length - right, Decreased step length - left, Trunk flexed, Decreased stride length Gait velocity: decr     General Gait Details: cues for sequencing and proximity to AD.  Stairs            Wheelchair Mobility    Modified Rankin (Stroke Patients Only)       Balance Overall balance assessment: Modified Independent                                           Pertinent Vitals/Pain Pain Assessment Pain Assessment: Faces Faces Pain Scale: Hurts a little bit Pain Location: L knee Pain Descriptors / Indicators: Sore Pain Intervention(s): Monitored during session, Repositioned, Ice applied    Home Living Family/patient expects to be discharged to:: Private residence Living Arrangements: Children Available Help at Discharge: Family;Available 24 hours/day Type of Home: House Home Access: Stairs to enter Entrance Stairs-Rails: Can reach both;Left;Right Entrance Stairs-Number of Steps: 3   Home Layout: One level Home Equipment: Conservation officer, nature (2 wheels)      Prior Function Prior Level of Function : Independent/Modified Independent;Driving             Mobility Comments: used cane occasionally due to knee pain prior to sx. ADLs Comments: independent  Hand Dominance   Dominant Hand: Right    Extremity/Trunk Assessment   Upper Extremity Assessment Upper Extremity Assessment: Overall WFL for tasks assessed    Lower Extremity Assessment Lower Extremity Assessment: LLE deficits/detail LLE Coordination: decreased gross motor    Cervical / Trunk Assessment Cervical / Trunk Assessment: Normal  Communication    Communication: No difficulties  Cognition Arousal/Alertness: Awake/alert Behavior During Therapy: WFL for tasks assessed/performed Overall Cognitive Status: Within Functional Limits for tasks assessed                                          General Comments      Exercises Total Joint Exercises Ankle Circles/Pumps: AROM, Both, 15 reps Quad Sets: AROM, Left, 10 reps Long Arc Quad: AROM, Left, 10 reps Goniometric ROM: 0-85   Assessment/Plan    PT Assessment Patient needs continued PT services  PT Problem List Decreased strength;Decreased mobility;Decreased activity tolerance;Pain;Decreased skin integrity       PT Treatment Interventions DME instruction;Therapeutic exercise;Gait training;Stair training;Functional mobility training;Therapeutic activities;Patient/family education    PT Goals (Current goals can be found in the Care Plan section)  Acute Rehab PT Goals Patient Stated Goal: to return home and get back to independence PT Goal Formulation: With patient Time For Goal Achievement: 06/12/22 Potential to Achieve Goals: Good    Frequency BID     Co-evaluation               AM-PAC PT "6 Clicks" Mobility  Outcome Measure Help needed turning from your back to your side while in a flat bed without using bedrails?: None Help needed moving from lying on your back to sitting on the side of a flat bed without using bedrails?: None Help needed moving to and from a bed to a chair (including a wheelchair)?: A Little Help needed standing up from a chair using your arms (e.g., wheelchair or bedside chair)?: A Little Help needed to walk in hospital room?: A Little Help needed climbing 3-5 steps with a railing? : A Little 6 Click Score: 20    End of Session Equipment Utilized During Treatment: Gait belt Activity Tolerance: Patient tolerated treatment well Patient left: in chair;with call bell/phone within reach;with chair alarm set Nurse Communication:  Mobility status PT Visit Diagnosis: Other abnormalities of gait and mobility (R26.89);Pain;Muscle weakness (generalized) (M62.81);Difficulty in walking, not elsewhere classified (R26.2) Pain - Right/Left: Left Pain - part of body: Knee    Time: 8242-3536 PT Time Calculation (min) (ACUTE ONLY): 25 min   Charges:   PT Evaluation $PT Eval Moderate Complexity: 1 Mod PT Treatments $Gait Training: 8-22 mins        Emalea Mix, PT, GCS 06/09/22,10:17 AM

## 2022-06-10 ENCOUNTER — Ambulatory Visit: Payer: Medicare Other | Admitting: Cardiovascular Disease

## 2022-06-11 DIAGNOSIS — I482 Chronic atrial fibrillation, unspecified: Secondary | ICD-10-CM | POA: Diagnosis not present

## 2022-06-11 DIAGNOSIS — I5022 Chronic systolic (congestive) heart failure: Secondary | ICD-10-CM | POA: Diagnosis not present

## 2022-06-11 DIAGNOSIS — E785 Hyperlipidemia, unspecified: Secondary | ICD-10-CM | POA: Diagnosis not present

## 2022-06-11 DIAGNOSIS — Z87891 Personal history of nicotine dependence: Secondary | ICD-10-CM | POA: Diagnosis not present

## 2022-06-11 DIAGNOSIS — I130 Hypertensive heart and chronic kidney disease with heart failure and stage 1 through stage 4 chronic kidney disease, or unspecified chronic kidney disease: Secondary | ICD-10-CM | POA: Diagnosis not present

## 2022-06-11 DIAGNOSIS — Z7901 Long term (current) use of anticoagulants: Secondary | ICD-10-CM | POA: Diagnosis not present

## 2022-06-11 DIAGNOSIS — R911 Solitary pulmonary nodule: Secondary | ICD-10-CM | POA: Diagnosis not present

## 2022-06-11 DIAGNOSIS — Z471 Aftercare following joint replacement surgery: Secondary | ICD-10-CM | POA: Diagnosis not present

## 2022-06-11 DIAGNOSIS — I2510 Atherosclerotic heart disease of native coronary artery without angina pectoris: Secondary | ICD-10-CM | POA: Diagnosis not present

## 2022-06-11 DIAGNOSIS — I447 Left bundle-branch block, unspecified: Secondary | ICD-10-CM | POA: Diagnosis not present

## 2022-06-11 DIAGNOSIS — N1831 Chronic kidney disease, stage 3a: Secondary | ICD-10-CM | POA: Diagnosis not present

## 2022-06-11 DIAGNOSIS — I272 Pulmonary hypertension, unspecified: Secondary | ICD-10-CM | POA: Diagnosis not present

## 2022-06-11 DIAGNOSIS — I440 Atrioventricular block, first degree: Secondary | ICD-10-CM | POA: Diagnosis not present

## 2022-06-11 DIAGNOSIS — I700 Atherosclerosis of aorta: Secondary | ICD-10-CM | POA: Diagnosis not present

## 2022-06-11 DIAGNOSIS — J302 Other seasonal allergic rhinitis: Secondary | ICD-10-CM | POA: Diagnosis not present

## 2022-06-11 DIAGNOSIS — Z9181 History of falling: Secondary | ICD-10-CM | POA: Diagnosis not present

## 2022-06-11 DIAGNOSIS — Z96653 Presence of artificial knee joint, bilateral: Secondary | ICD-10-CM | POA: Diagnosis not present

## 2022-06-11 DIAGNOSIS — J449 Chronic obstructive pulmonary disease, unspecified: Secondary | ICD-10-CM | POA: Diagnosis not present

## 2022-06-11 DIAGNOSIS — I428 Other cardiomyopathies: Secondary | ICD-10-CM | POA: Diagnosis not present

## 2022-06-13 DIAGNOSIS — R911 Solitary pulmonary nodule: Secondary | ICD-10-CM | POA: Diagnosis not present

## 2022-06-13 DIAGNOSIS — I130 Hypertensive heart and chronic kidney disease with heart failure and stage 1 through stage 4 chronic kidney disease, or unspecified chronic kidney disease: Secondary | ICD-10-CM | POA: Diagnosis not present

## 2022-06-13 DIAGNOSIS — Z471 Aftercare following joint replacement surgery: Secondary | ICD-10-CM | POA: Diagnosis not present

## 2022-06-13 DIAGNOSIS — I2510 Atherosclerotic heart disease of native coronary artery without angina pectoris: Secondary | ICD-10-CM | POA: Diagnosis not present

## 2022-06-13 DIAGNOSIS — I272 Pulmonary hypertension, unspecified: Secondary | ICD-10-CM | POA: Diagnosis not present

## 2022-06-13 DIAGNOSIS — E785 Hyperlipidemia, unspecified: Secondary | ICD-10-CM | POA: Diagnosis not present

## 2022-06-13 DIAGNOSIS — Z9181 History of falling: Secondary | ICD-10-CM | POA: Diagnosis not present

## 2022-06-13 DIAGNOSIS — Z7901 Long term (current) use of anticoagulants: Secondary | ICD-10-CM | POA: Diagnosis not present

## 2022-06-13 DIAGNOSIS — I700 Atherosclerosis of aorta: Secondary | ICD-10-CM | POA: Diagnosis not present

## 2022-06-13 DIAGNOSIS — I440 Atrioventricular block, first degree: Secondary | ICD-10-CM | POA: Diagnosis not present

## 2022-06-13 DIAGNOSIS — Z87891 Personal history of nicotine dependence: Secondary | ICD-10-CM | POA: Diagnosis not present

## 2022-06-13 DIAGNOSIS — I447 Left bundle-branch block, unspecified: Secondary | ICD-10-CM | POA: Diagnosis not present

## 2022-06-13 DIAGNOSIS — J449 Chronic obstructive pulmonary disease, unspecified: Secondary | ICD-10-CM | POA: Diagnosis not present

## 2022-06-13 DIAGNOSIS — J302 Other seasonal allergic rhinitis: Secondary | ICD-10-CM | POA: Diagnosis not present

## 2022-06-13 DIAGNOSIS — E8881 Metabolic syndrome: Secondary | ICD-10-CM | POA: Diagnosis not present

## 2022-06-13 DIAGNOSIS — Z96653 Presence of artificial knee joint, bilateral: Secondary | ICD-10-CM | POA: Diagnosis not present

## 2022-06-13 DIAGNOSIS — I482 Chronic atrial fibrillation, unspecified: Secondary | ICD-10-CM | POA: Diagnosis not present

## 2022-06-13 DIAGNOSIS — I428 Other cardiomyopathies: Secondary | ICD-10-CM | POA: Diagnosis not present

## 2022-06-13 DIAGNOSIS — I5022 Chronic systolic (congestive) heart failure: Secondary | ICD-10-CM | POA: Diagnosis not present

## 2022-06-13 DIAGNOSIS — N1831 Chronic kidney disease, stage 3a: Secondary | ICD-10-CM | POA: Diagnosis not present

## 2022-06-14 DIAGNOSIS — I482 Chronic atrial fibrillation, unspecified: Secondary | ICD-10-CM | POA: Diagnosis not present

## 2022-06-14 DIAGNOSIS — N1831 Chronic kidney disease, stage 3a: Secondary | ICD-10-CM | POA: Diagnosis not present

## 2022-06-14 DIAGNOSIS — I5022 Chronic systolic (congestive) heart failure: Secondary | ICD-10-CM | POA: Diagnosis not present

## 2022-06-14 DIAGNOSIS — E785 Hyperlipidemia, unspecified: Secondary | ICD-10-CM | POA: Diagnosis not present

## 2022-06-14 DIAGNOSIS — Z7901 Long term (current) use of anticoagulants: Secondary | ICD-10-CM | POA: Diagnosis not present

## 2022-06-14 DIAGNOSIS — Z96653 Presence of artificial knee joint, bilateral: Secondary | ICD-10-CM | POA: Diagnosis not present

## 2022-06-14 DIAGNOSIS — I2510 Atherosclerotic heart disease of native coronary artery without angina pectoris: Secondary | ICD-10-CM | POA: Diagnosis not present

## 2022-06-14 DIAGNOSIS — I428 Other cardiomyopathies: Secondary | ICD-10-CM | POA: Diagnosis not present

## 2022-06-14 DIAGNOSIS — E8881 Metabolic syndrome: Secondary | ICD-10-CM | POA: Diagnosis not present

## 2022-06-14 DIAGNOSIS — I700 Atherosclerosis of aorta: Secondary | ICD-10-CM | POA: Diagnosis not present

## 2022-06-14 DIAGNOSIS — J302 Other seasonal allergic rhinitis: Secondary | ICD-10-CM | POA: Diagnosis not present

## 2022-06-14 DIAGNOSIS — R911 Solitary pulmonary nodule: Secondary | ICD-10-CM | POA: Diagnosis not present

## 2022-06-14 DIAGNOSIS — J449 Chronic obstructive pulmonary disease, unspecified: Secondary | ICD-10-CM | POA: Diagnosis not present

## 2022-06-14 DIAGNOSIS — Z471 Aftercare following joint replacement surgery: Secondary | ICD-10-CM | POA: Diagnosis not present

## 2022-06-14 DIAGNOSIS — I440 Atrioventricular block, first degree: Secondary | ICD-10-CM | POA: Diagnosis not present

## 2022-06-14 DIAGNOSIS — I447 Left bundle-branch block, unspecified: Secondary | ICD-10-CM | POA: Diagnosis not present

## 2022-06-14 DIAGNOSIS — Z9181 History of falling: Secondary | ICD-10-CM | POA: Diagnosis not present

## 2022-06-14 DIAGNOSIS — I272 Pulmonary hypertension, unspecified: Secondary | ICD-10-CM | POA: Diagnosis not present

## 2022-06-14 DIAGNOSIS — Z87891 Personal history of nicotine dependence: Secondary | ICD-10-CM | POA: Diagnosis not present

## 2022-06-14 DIAGNOSIS — I130 Hypertensive heart and chronic kidney disease with heart failure and stage 1 through stage 4 chronic kidney disease, or unspecified chronic kidney disease: Secondary | ICD-10-CM | POA: Diagnosis not present

## 2022-06-16 DIAGNOSIS — R911 Solitary pulmonary nodule: Secondary | ICD-10-CM | POA: Diagnosis not present

## 2022-06-16 DIAGNOSIS — N1831 Chronic kidney disease, stage 3a: Secondary | ICD-10-CM | POA: Diagnosis not present

## 2022-06-16 DIAGNOSIS — I428 Other cardiomyopathies: Secondary | ICD-10-CM | POA: Diagnosis not present

## 2022-06-16 DIAGNOSIS — I447 Left bundle-branch block, unspecified: Secondary | ICD-10-CM | POA: Diagnosis not present

## 2022-06-16 DIAGNOSIS — I130 Hypertensive heart and chronic kidney disease with heart failure and stage 1 through stage 4 chronic kidney disease, or unspecified chronic kidney disease: Secondary | ICD-10-CM | POA: Diagnosis not present

## 2022-06-16 DIAGNOSIS — J449 Chronic obstructive pulmonary disease, unspecified: Secondary | ICD-10-CM | POA: Diagnosis not present

## 2022-06-16 DIAGNOSIS — Z96653 Presence of artificial knee joint, bilateral: Secondary | ICD-10-CM | POA: Diagnosis not present

## 2022-06-16 DIAGNOSIS — E785 Hyperlipidemia, unspecified: Secondary | ICD-10-CM | POA: Diagnosis not present

## 2022-06-16 DIAGNOSIS — E8881 Metabolic syndrome: Secondary | ICD-10-CM | POA: Diagnosis not present

## 2022-06-16 DIAGNOSIS — I5022 Chronic systolic (congestive) heart failure: Secondary | ICD-10-CM | POA: Diagnosis not present

## 2022-06-16 DIAGNOSIS — Z87891 Personal history of nicotine dependence: Secondary | ICD-10-CM | POA: Diagnosis not present

## 2022-06-16 DIAGNOSIS — J302 Other seasonal allergic rhinitis: Secondary | ICD-10-CM | POA: Diagnosis not present

## 2022-06-16 DIAGNOSIS — Z7901 Long term (current) use of anticoagulants: Secondary | ICD-10-CM | POA: Diagnosis not present

## 2022-06-16 DIAGNOSIS — Z471 Aftercare following joint replacement surgery: Secondary | ICD-10-CM | POA: Diagnosis not present

## 2022-06-16 DIAGNOSIS — I700 Atherosclerosis of aorta: Secondary | ICD-10-CM | POA: Diagnosis not present

## 2022-06-16 DIAGNOSIS — Z9181 History of falling: Secondary | ICD-10-CM | POA: Diagnosis not present

## 2022-06-16 DIAGNOSIS — I440 Atrioventricular block, first degree: Secondary | ICD-10-CM | POA: Diagnosis not present

## 2022-06-16 DIAGNOSIS — I482 Chronic atrial fibrillation, unspecified: Secondary | ICD-10-CM | POA: Diagnosis not present

## 2022-06-16 DIAGNOSIS — I2510 Atherosclerotic heart disease of native coronary artery without angina pectoris: Secondary | ICD-10-CM | POA: Diagnosis not present

## 2022-06-16 DIAGNOSIS — I272 Pulmonary hypertension, unspecified: Secondary | ICD-10-CM | POA: Diagnosis not present

## 2022-06-18 DIAGNOSIS — I428 Other cardiomyopathies: Secondary | ICD-10-CM | POA: Diagnosis not present

## 2022-06-18 DIAGNOSIS — R911 Solitary pulmonary nodule: Secondary | ICD-10-CM | POA: Diagnosis not present

## 2022-06-18 DIAGNOSIS — J302 Other seasonal allergic rhinitis: Secondary | ICD-10-CM | POA: Diagnosis not present

## 2022-06-18 DIAGNOSIS — I447 Left bundle-branch block, unspecified: Secondary | ICD-10-CM | POA: Diagnosis not present

## 2022-06-18 DIAGNOSIS — I5022 Chronic systolic (congestive) heart failure: Secondary | ICD-10-CM | POA: Diagnosis not present

## 2022-06-18 DIAGNOSIS — I440 Atrioventricular block, first degree: Secondary | ICD-10-CM | POA: Diagnosis not present

## 2022-06-18 DIAGNOSIS — Z96653 Presence of artificial knee joint, bilateral: Secondary | ICD-10-CM | POA: Diagnosis not present

## 2022-06-18 DIAGNOSIS — N1831 Chronic kidney disease, stage 3a: Secondary | ICD-10-CM | POA: Diagnosis not present

## 2022-06-18 DIAGNOSIS — I482 Chronic atrial fibrillation, unspecified: Secondary | ICD-10-CM | POA: Diagnosis not present

## 2022-06-18 DIAGNOSIS — J449 Chronic obstructive pulmonary disease, unspecified: Secondary | ICD-10-CM | POA: Diagnosis not present

## 2022-06-18 DIAGNOSIS — E785 Hyperlipidemia, unspecified: Secondary | ICD-10-CM | POA: Diagnosis not present

## 2022-06-18 DIAGNOSIS — I700 Atherosclerosis of aorta: Secondary | ICD-10-CM | POA: Diagnosis not present

## 2022-06-18 DIAGNOSIS — I272 Pulmonary hypertension, unspecified: Secondary | ICD-10-CM | POA: Diagnosis not present

## 2022-06-18 DIAGNOSIS — Z9181 History of falling: Secondary | ICD-10-CM | POA: Diagnosis not present

## 2022-06-18 DIAGNOSIS — Z471 Aftercare following joint replacement surgery: Secondary | ICD-10-CM | POA: Diagnosis not present

## 2022-06-18 DIAGNOSIS — I130 Hypertensive heart and chronic kidney disease with heart failure and stage 1 through stage 4 chronic kidney disease, or unspecified chronic kidney disease: Secondary | ICD-10-CM | POA: Diagnosis not present

## 2022-06-18 DIAGNOSIS — Z7901 Long term (current) use of anticoagulants: Secondary | ICD-10-CM | POA: Diagnosis not present

## 2022-06-18 DIAGNOSIS — Z87891 Personal history of nicotine dependence: Secondary | ICD-10-CM | POA: Diagnosis not present

## 2022-06-18 DIAGNOSIS — I2510 Atherosclerotic heart disease of native coronary artery without angina pectoris: Secondary | ICD-10-CM | POA: Diagnosis not present

## 2022-06-18 DIAGNOSIS — E8881 Metabolic syndrome: Secondary | ICD-10-CM | POA: Diagnosis not present

## 2022-06-20 ENCOUNTER — Other Ambulatory Visit: Payer: Self-pay | Admitting: Cardiovascular Disease

## 2022-06-22 DIAGNOSIS — I130 Hypertensive heart and chronic kidney disease with heart failure and stage 1 through stage 4 chronic kidney disease, or unspecified chronic kidney disease: Secondary | ICD-10-CM | POA: Diagnosis not present

## 2022-06-22 DIAGNOSIS — N1831 Chronic kidney disease, stage 3a: Secondary | ICD-10-CM | POA: Diagnosis not present

## 2022-06-22 DIAGNOSIS — Z7901 Long term (current) use of anticoagulants: Secondary | ICD-10-CM | POA: Diagnosis not present

## 2022-06-22 DIAGNOSIS — J449 Chronic obstructive pulmonary disease, unspecified: Secondary | ICD-10-CM | POA: Diagnosis not present

## 2022-06-22 DIAGNOSIS — J302 Other seasonal allergic rhinitis: Secondary | ICD-10-CM | POA: Diagnosis not present

## 2022-06-22 DIAGNOSIS — I428 Other cardiomyopathies: Secondary | ICD-10-CM | POA: Diagnosis not present

## 2022-06-22 DIAGNOSIS — I447 Left bundle-branch block, unspecified: Secondary | ICD-10-CM | POA: Diagnosis not present

## 2022-06-22 DIAGNOSIS — Z471 Aftercare following joint replacement surgery: Secondary | ICD-10-CM | POA: Diagnosis not present

## 2022-06-22 DIAGNOSIS — Z87891 Personal history of nicotine dependence: Secondary | ICD-10-CM | POA: Diagnosis not present

## 2022-06-22 DIAGNOSIS — I700 Atherosclerosis of aorta: Secondary | ICD-10-CM | POA: Diagnosis not present

## 2022-06-22 DIAGNOSIS — I5022 Chronic systolic (congestive) heart failure: Secondary | ICD-10-CM | POA: Diagnosis not present

## 2022-06-22 DIAGNOSIS — I482 Chronic atrial fibrillation, unspecified: Secondary | ICD-10-CM | POA: Diagnosis not present

## 2022-06-22 DIAGNOSIS — I272 Pulmonary hypertension, unspecified: Secondary | ICD-10-CM | POA: Diagnosis not present

## 2022-06-22 DIAGNOSIS — Z9181 History of falling: Secondary | ICD-10-CM | POA: Diagnosis not present

## 2022-06-22 DIAGNOSIS — I2510 Atherosclerotic heart disease of native coronary artery without angina pectoris: Secondary | ICD-10-CM | POA: Diagnosis not present

## 2022-06-22 DIAGNOSIS — E8881 Metabolic syndrome: Secondary | ICD-10-CM | POA: Diagnosis not present

## 2022-06-22 DIAGNOSIS — I440 Atrioventricular block, first degree: Secondary | ICD-10-CM | POA: Diagnosis not present

## 2022-06-22 DIAGNOSIS — Z96653 Presence of artificial knee joint, bilateral: Secondary | ICD-10-CM | POA: Diagnosis not present

## 2022-06-22 DIAGNOSIS — R911 Solitary pulmonary nodule: Secondary | ICD-10-CM | POA: Diagnosis not present

## 2022-06-22 DIAGNOSIS — E785 Hyperlipidemia, unspecified: Secondary | ICD-10-CM | POA: Diagnosis not present

## 2022-06-23 DIAGNOSIS — E785 Hyperlipidemia, unspecified: Secondary | ICD-10-CM | POA: Diagnosis not present

## 2022-06-23 DIAGNOSIS — I440 Atrioventricular block, first degree: Secondary | ICD-10-CM | POA: Diagnosis not present

## 2022-06-23 DIAGNOSIS — I482 Chronic atrial fibrillation, unspecified: Secondary | ICD-10-CM | POA: Diagnosis not present

## 2022-06-23 DIAGNOSIS — J449 Chronic obstructive pulmonary disease, unspecified: Secondary | ICD-10-CM | POA: Diagnosis not present

## 2022-06-23 DIAGNOSIS — J302 Other seasonal allergic rhinitis: Secondary | ICD-10-CM | POA: Diagnosis not present

## 2022-06-23 DIAGNOSIS — I130 Hypertensive heart and chronic kidney disease with heart failure and stage 1 through stage 4 chronic kidney disease, or unspecified chronic kidney disease: Secondary | ICD-10-CM | POA: Diagnosis not present

## 2022-06-23 DIAGNOSIS — I5022 Chronic systolic (congestive) heart failure: Secondary | ICD-10-CM | POA: Diagnosis not present

## 2022-06-23 DIAGNOSIS — I447 Left bundle-branch block, unspecified: Secondary | ICD-10-CM | POA: Diagnosis not present

## 2022-06-23 DIAGNOSIS — I700 Atherosclerosis of aorta: Secondary | ICD-10-CM | POA: Diagnosis not present

## 2022-06-23 DIAGNOSIS — Z9181 History of falling: Secondary | ICD-10-CM | POA: Diagnosis not present

## 2022-06-23 DIAGNOSIS — Z7901 Long term (current) use of anticoagulants: Secondary | ICD-10-CM | POA: Diagnosis not present

## 2022-06-23 DIAGNOSIS — E8881 Metabolic syndrome: Secondary | ICD-10-CM | POA: Diagnosis not present

## 2022-06-23 DIAGNOSIS — Z96653 Presence of artificial knee joint, bilateral: Secondary | ICD-10-CM | POA: Diagnosis not present

## 2022-06-23 DIAGNOSIS — Z471 Aftercare following joint replacement surgery: Secondary | ICD-10-CM | POA: Diagnosis not present

## 2022-06-23 DIAGNOSIS — I428 Other cardiomyopathies: Secondary | ICD-10-CM | POA: Diagnosis not present

## 2022-06-23 DIAGNOSIS — R911 Solitary pulmonary nodule: Secondary | ICD-10-CM | POA: Diagnosis not present

## 2022-06-23 DIAGNOSIS — Z87891 Personal history of nicotine dependence: Secondary | ICD-10-CM | POA: Diagnosis not present

## 2022-06-23 DIAGNOSIS — N1831 Chronic kidney disease, stage 3a: Secondary | ICD-10-CM | POA: Diagnosis not present

## 2022-06-23 DIAGNOSIS — I2510 Atherosclerotic heart disease of native coronary artery without angina pectoris: Secondary | ICD-10-CM | POA: Diagnosis not present

## 2022-06-23 DIAGNOSIS — I272 Pulmonary hypertension, unspecified: Secondary | ICD-10-CM | POA: Diagnosis not present

## 2022-06-25 ENCOUNTER — Other Ambulatory Visit: Payer: Self-pay | Admitting: Family

## 2022-06-25 ENCOUNTER — Other Ambulatory Visit: Payer: Self-pay | Admitting: Family Medicine

## 2022-06-25 DIAGNOSIS — I5022 Chronic systolic (congestive) heart failure: Secondary | ICD-10-CM | POA: Diagnosis not present

## 2022-06-25 DIAGNOSIS — J302 Other seasonal allergic rhinitis: Secondary | ICD-10-CM

## 2022-06-25 DIAGNOSIS — I130 Hypertensive heart and chronic kidney disease with heart failure and stage 1 through stage 4 chronic kidney disease, or unspecified chronic kidney disease: Secondary | ICD-10-CM | POA: Diagnosis not present

## 2022-06-25 DIAGNOSIS — I700 Atherosclerosis of aorta: Secondary | ICD-10-CM | POA: Diagnosis not present

## 2022-06-25 DIAGNOSIS — Z7901 Long term (current) use of anticoagulants: Secondary | ICD-10-CM | POA: Diagnosis not present

## 2022-06-25 DIAGNOSIS — I428 Other cardiomyopathies: Secondary | ICD-10-CM | POA: Diagnosis not present

## 2022-06-25 DIAGNOSIS — I272 Pulmonary hypertension, unspecified: Secondary | ICD-10-CM | POA: Diagnosis not present

## 2022-06-25 DIAGNOSIS — I447 Left bundle-branch block, unspecified: Secondary | ICD-10-CM | POA: Diagnosis not present

## 2022-06-25 DIAGNOSIS — J449 Chronic obstructive pulmonary disease, unspecified: Secondary | ICD-10-CM | POA: Diagnosis not present

## 2022-06-25 DIAGNOSIS — I440 Atrioventricular block, first degree: Secondary | ICD-10-CM | POA: Diagnosis not present

## 2022-06-25 DIAGNOSIS — Z96653 Presence of artificial knee joint, bilateral: Secondary | ICD-10-CM | POA: Diagnosis not present

## 2022-06-25 DIAGNOSIS — I482 Chronic atrial fibrillation, unspecified: Secondary | ICD-10-CM | POA: Diagnosis not present

## 2022-06-25 DIAGNOSIS — E785 Hyperlipidemia, unspecified: Secondary | ICD-10-CM | POA: Diagnosis not present

## 2022-06-25 DIAGNOSIS — R911 Solitary pulmonary nodule: Secondary | ICD-10-CM | POA: Diagnosis not present

## 2022-06-25 DIAGNOSIS — N1831 Chronic kidney disease, stage 3a: Secondary | ICD-10-CM | POA: Diagnosis not present

## 2022-06-25 DIAGNOSIS — E8881 Metabolic syndrome: Secondary | ICD-10-CM | POA: Diagnosis not present

## 2022-06-25 DIAGNOSIS — I2510 Atherosclerotic heart disease of native coronary artery without angina pectoris: Secondary | ICD-10-CM | POA: Diagnosis not present

## 2022-06-25 DIAGNOSIS — Z87891 Personal history of nicotine dependence: Secondary | ICD-10-CM | POA: Diagnosis not present

## 2022-06-25 DIAGNOSIS — Z471 Aftercare following joint replacement surgery: Secondary | ICD-10-CM | POA: Diagnosis not present

## 2022-06-25 DIAGNOSIS — Z9181 History of falling: Secondary | ICD-10-CM | POA: Diagnosis not present

## 2022-06-30 DIAGNOSIS — I130 Hypertensive heart and chronic kidney disease with heart failure and stage 1 through stage 4 chronic kidney disease, or unspecified chronic kidney disease: Secondary | ICD-10-CM | POA: Diagnosis not present

## 2022-06-30 DIAGNOSIS — E785 Hyperlipidemia, unspecified: Secondary | ICD-10-CM | POA: Diagnosis not present

## 2022-06-30 DIAGNOSIS — I700 Atherosclerosis of aorta: Secondary | ICD-10-CM | POA: Diagnosis not present

## 2022-06-30 DIAGNOSIS — Z87891 Personal history of nicotine dependence: Secondary | ICD-10-CM | POA: Diagnosis not present

## 2022-06-30 DIAGNOSIS — I2510 Atherosclerotic heart disease of native coronary artery without angina pectoris: Secondary | ICD-10-CM | POA: Diagnosis not present

## 2022-06-30 DIAGNOSIS — I482 Chronic atrial fibrillation, unspecified: Secondary | ICD-10-CM | POA: Diagnosis not present

## 2022-06-30 DIAGNOSIS — E8881 Metabolic syndrome: Secondary | ICD-10-CM | POA: Diagnosis not present

## 2022-06-30 DIAGNOSIS — R911 Solitary pulmonary nodule: Secondary | ICD-10-CM | POA: Diagnosis not present

## 2022-06-30 DIAGNOSIS — I5022 Chronic systolic (congestive) heart failure: Secondary | ICD-10-CM | POA: Diagnosis not present

## 2022-06-30 DIAGNOSIS — Z7901 Long term (current) use of anticoagulants: Secondary | ICD-10-CM | POA: Diagnosis not present

## 2022-06-30 DIAGNOSIS — N1831 Chronic kidney disease, stage 3a: Secondary | ICD-10-CM | POA: Diagnosis not present

## 2022-06-30 DIAGNOSIS — J449 Chronic obstructive pulmonary disease, unspecified: Secondary | ICD-10-CM | POA: Diagnosis not present

## 2022-06-30 DIAGNOSIS — J302 Other seasonal allergic rhinitis: Secondary | ICD-10-CM | POA: Diagnosis not present

## 2022-06-30 DIAGNOSIS — Z96653 Presence of artificial knee joint, bilateral: Secondary | ICD-10-CM | POA: Diagnosis not present

## 2022-06-30 DIAGNOSIS — I272 Pulmonary hypertension, unspecified: Secondary | ICD-10-CM | POA: Diagnosis not present

## 2022-06-30 DIAGNOSIS — Z471 Aftercare following joint replacement surgery: Secondary | ICD-10-CM | POA: Diagnosis not present

## 2022-06-30 DIAGNOSIS — Z9181 History of falling: Secondary | ICD-10-CM | POA: Diagnosis not present

## 2022-06-30 DIAGNOSIS — I440 Atrioventricular block, first degree: Secondary | ICD-10-CM | POA: Diagnosis not present

## 2022-06-30 DIAGNOSIS — I447 Left bundle-branch block, unspecified: Secondary | ICD-10-CM | POA: Diagnosis not present

## 2022-06-30 DIAGNOSIS — I428 Other cardiomyopathies: Secondary | ICD-10-CM | POA: Diagnosis not present

## 2022-07-02 DIAGNOSIS — Z7901 Long term (current) use of anticoagulants: Secondary | ICD-10-CM | POA: Diagnosis not present

## 2022-07-02 DIAGNOSIS — I5022 Chronic systolic (congestive) heart failure: Secondary | ICD-10-CM | POA: Diagnosis not present

## 2022-07-02 DIAGNOSIS — Z87891 Personal history of nicotine dependence: Secondary | ICD-10-CM | POA: Diagnosis not present

## 2022-07-02 DIAGNOSIS — I428 Other cardiomyopathies: Secondary | ICD-10-CM | POA: Diagnosis not present

## 2022-07-02 DIAGNOSIS — E785 Hyperlipidemia, unspecified: Secondary | ICD-10-CM | POA: Diagnosis not present

## 2022-07-02 DIAGNOSIS — I700 Atherosclerosis of aorta: Secondary | ICD-10-CM | POA: Diagnosis not present

## 2022-07-02 DIAGNOSIS — Z9181 History of falling: Secondary | ICD-10-CM | POA: Diagnosis not present

## 2022-07-02 DIAGNOSIS — Z96653 Presence of artificial knee joint, bilateral: Secondary | ICD-10-CM | POA: Diagnosis not present

## 2022-07-02 DIAGNOSIS — I272 Pulmonary hypertension, unspecified: Secondary | ICD-10-CM | POA: Diagnosis not present

## 2022-07-02 DIAGNOSIS — I440 Atrioventricular block, first degree: Secondary | ICD-10-CM | POA: Diagnosis not present

## 2022-07-02 DIAGNOSIS — J449 Chronic obstructive pulmonary disease, unspecified: Secondary | ICD-10-CM | POA: Diagnosis not present

## 2022-07-02 DIAGNOSIS — I130 Hypertensive heart and chronic kidney disease with heart failure and stage 1 through stage 4 chronic kidney disease, or unspecified chronic kidney disease: Secondary | ICD-10-CM | POA: Diagnosis not present

## 2022-07-02 DIAGNOSIS — I447 Left bundle-branch block, unspecified: Secondary | ICD-10-CM | POA: Diagnosis not present

## 2022-07-02 DIAGNOSIS — I482 Chronic atrial fibrillation, unspecified: Secondary | ICD-10-CM | POA: Diagnosis not present

## 2022-07-02 DIAGNOSIS — Z471 Aftercare following joint replacement surgery: Secondary | ICD-10-CM | POA: Diagnosis not present

## 2022-07-02 DIAGNOSIS — N1831 Chronic kidney disease, stage 3a: Secondary | ICD-10-CM | POA: Diagnosis not present

## 2022-07-02 DIAGNOSIS — J302 Other seasonal allergic rhinitis: Secondary | ICD-10-CM | POA: Diagnosis not present

## 2022-07-02 DIAGNOSIS — I2510 Atherosclerotic heart disease of native coronary artery without angina pectoris: Secondary | ICD-10-CM | POA: Diagnosis not present

## 2022-07-02 DIAGNOSIS — E8881 Metabolic syndrome: Secondary | ICD-10-CM | POA: Diagnosis not present

## 2022-07-02 DIAGNOSIS — R911 Solitary pulmonary nodule: Secondary | ICD-10-CM | POA: Diagnosis not present

## 2022-07-09 DIAGNOSIS — M25662 Stiffness of left knee, not elsewhere classified: Secondary | ICD-10-CM | POA: Diagnosis not present

## 2022-07-09 DIAGNOSIS — M25562 Pain in left knee: Secondary | ICD-10-CM | POA: Diagnosis not present

## 2022-07-12 DIAGNOSIS — M25562 Pain in left knee: Secondary | ICD-10-CM | POA: Diagnosis not present

## 2022-07-12 DIAGNOSIS — M25662 Stiffness of left knee, not elsewhere classified: Secondary | ICD-10-CM | POA: Diagnosis not present

## 2022-07-23 DIAGNOSIS — M25662 Stiffness of left knee, not elsewhere classified: Secondary | ICD-10-CM | POA: Diagnosis not present

## 2022-07-23 DIAGNOSIS — Z96652 Presence of left artificial knee joint: Secondary | ICD-10-CM | POA: Diagnosis not present

## 2022-07-23 DIAGNOSIS — M25562 Pain in left knee: Secondary | ICD-10-CM | POA: Diagnosis not present

## 2022-07-26 DIAGNOSIS — M25562 Pain in left knee: Secondary | ICD-10-CM | POA: Diagnosis not present

## 2022-07-26 DIAGNOSIS — M25662 Stiffness of left knee, not elsewhere classified: Secondary | ICD-10-CM | POA: Diagnosis not present

## 2022-07-30 DIAGNOSIS — M25562 Pain in left knee: Secondary | ICD-10-CM | POA: Diagnosis not present

## 2022-07-30 DIAGNOSIS — M25662 Stiffness of left knee, not elsewhere classified: Secondary | ICD-10-CM | POA: Diagnosis not present

## 2022-08-02 ENCOUNTER — Observation Stay
Admission: EM | Admit: 2022-08-02 | Discharge: 2022-08-04 | Disposition: A | Discharge status: Home / Self Care | Payer: Medicare Other | Attending: Internal Medicine | Admitting: Internal Medicine

## 2022-08-02 ENCOUNTER — Emergency Department: Payer: Medicare Other

## 2022-08-02 ENCOUNTER — Ambulatory Visit: Payer: Self-pay

## 2022-08-02 ENCOUNTER — Ambulatory Visit (INDEPENDENT_AMBULATORY_CARE_PROVIDER_SITE_OTHER): Payer: Medicare Other | Admitting: Family Medicine

## 2022-08-02 ENCOUNTER — Other Ambulatory Visit: Payer: Self-pay

## 2022-08-02 ENCOUNTER — Encounter: Payer: Self-pay | Admitting: Internal Medicine

## 2022-08-02 ENCOUNTER — Encounter: Payer: Self-pay | Admitting: Family Medicine

## 2022-08-02 VITALS — BP 162/96 | HR 87 | Temp 98.1°F | Resp 16 | Ht 63.0 in | Wt 287.6 lb

## 2022-08-02 DIAGNOSIS — K254 Chronic or unspecified gastric ulcer with hemorrhage: Secondary | ICD-10-CM | POA: Diagnosis not present

## 2022-08-02 DIAGNOSIS — N179 Acute kidney failure, unspecified: Secondary | ICD-10-CM | POA: Diagnosis not present

## 2022-08-02 DIAGNOSIS — I081 Rheumatic disorders of both mitral and tricuspid valves: Secondary | ICD-10-CM | POA: Insufficient documentation

## 2022-08-02 DIAGNOSIS — Z96652 Presence of left artificial knee joint: Secondary | ICD-10-CM | POA: Insufficient documentation

## 2022-08-02 DIAGNOSIS — R0602 Shortness of breath: Secondary | ICD-10-CM | POA: Diagnosis not present

## 2022-08-02 DIAGNOSIS — E785 Hyperlipidemia, unspecified: Secondary | ICD-10-CM | POA: Insufficient documentation

## 2022-08-02 DIAGNOSIS — I480 Paroxysmal atrial fibrillation: Secondary | ICD-10-CM | POA: Insufficient documentation

## 2022-08-02 DIAGNOSIS — I428 Other cardiomyopathies: Secondary | ICD-10-CM | POA: Insufficient documentation

## 2022-08-02 DIAGNOSIS — Z6841 Body Mass Index (BMI) 40.0 and over, adult: Secondary | ICD-10-CM | POA: Insufficient documentation

## 2022-08-02 DIAGNOSIS — Z87891 Personal history of nicotine dependence: Secondary | ICD-10-CM | POA: Insufficient documentation

## 2022-08-02 DIAGNOSIS — I700 Atherosclerosis of aorta: Secondary | ICD-10-CM | POA: Diagnosis not present

## 2022-08-02 DIAGNOSIS — D649 Anemia, unspecified: Secondary | ICD-10-CM | POA: Diagnosis not present

## 2022-08-02 DIAGNOSIS — Z5181 Encounter for therapeutic drug level monitoring: Secondary | ICD-10-CM

## 2022-08-02 DIAGNOSIS — Z8541 Personal history of malignant neoplasm of cervix uteri: Secondary | ICD-10-CM | POA: Insufficient documentation

## 2022-08-02 DIAGNOSIS — R0609 Other forms of dyspnea: Secondary | ICD-10-CM

## 2022-08-02 DIAGNOSIS — Z23 Encounter for immunization: Secondary | ICD-10-CM | POA: Insufficient documentation

## 2022-08-02 DIAGNOSIS — J449 Chronic obstructive pulmonary disease, unspecified: Secondary | ICD-10-CM | POA: Insufficient documentation

## 2022-08-02 DIAGNOSIS — I4891 Unspecified atrial fibrillation: Secondary | ICD-10-CM

## 2022-08-02 DIAGNOSIS — I509 Heart failure, unspecified: Secondary | ICD-10-CM | POA: Diagnosis not present

## 2022-08-02 DIAGNOSIS — K922 Gastrointestinal hemorrhage, unspecified: Secondary | ICD-10-CM

## 2022-08-02 DIAGNOSIS — J90 Pleural effusion, not elsewhere classified: Secondary | ICD-10-CM | POA: Diagnosis not present

## 2022-08-02 DIAGNOSIS — K319 Disease of stomach and duodenum, unspecified: Secondary | ICD-10-CM | POA: Insufficient documentation

## 2022-08-02 DIAGNOSIS — K921 Melena: Secondary | ICD-10-CM | POA: Diagnosis present

## 2022-08-02 DIAGNOSIS — I5023 Acute on chronic systolic (congestive) heart failure: Secondary | ICD-10-CM | POA: Diagnosis not present

## 2022-08-02 DIAGNOSIS — I5022 Chronic systolic (congestive) heart failure: Secondary | ICD-10-CM | POA: Diagnosis not present

## 2022-08-02 DIAGNOSIS — I5043 Acute on chronic combined systolic (congestive) and diastolic (congestive) heart failure: Secondary | ICD-10-CM | POA: Insufficient documentation

## 2022-08-02 DIAGNOSIS — K264 Chronic or unspecified duodenal ulcer with hemorrhage: Secondary | ICD-10-CM | POA: Insufficient documentation

## 2022-08-02 DIAGNOSIS — Z7901 Long term (current) use of anticoagulants: Secondary | ICD-10-CM | POA: Diagnosis not present

## 2022-08-02 DIAGNOSIS — I110 Hypertensive heart disease with heart failure: Secondary | ICD-10-CM | POA: Diagnosis not present

## 2022-08-02 DIAGNOSIS — R197 Diarrhea, unspecified: Secondary | ICD-10-CM

## 2022-08-02 DIAGNOSIS — I2510 Atherosclerotic heart disease of native coronary artery without angina pectoris: Secondary | ICD-10-CM | POA: Insufficient documentation

## 2022-08-02 DIAGNOSIS — I5033 Acute on chronic diastolic (congestive) heart failure: Secondary | ICD-10-CM | POA: Diagnosis present

## 2022-08-02 DIAGNOSIS — E875 Hyperkalemia: Secondary | ICD-10-CM | POA: Diagnosis not present

## 2022-08-02 LAB — CBC
HCT: 29.5 % — ABNORMAL LOW (ref 36.0–46.0)
Hemoglobin: 9.7 g/dL — ABNORMAL LOW (ref 12.0–15.0)
MCH: 31.2 pg (ref 26.0–34.0)
MCHC: 32.9 g/dL (ref 30.0–36.0)
MCV: 94.9 fL (ref 80.0–100.0)
Platelets: 210 10*3/uL (ref 150–400)
RBC: 3.11 MIL/uL — ABNORMAL LOW (ref 3.87–5.11)
RDW: 13.5 % (ref 11.5–15.5)
WBC: 5.4 10*3/uL (ref 4.0–10.5)
nRBC: 0 % (ref 0.0–0.2)

## 2022-08-02 LAB — COMPREHENSIVE METABOLIC PANEL
ALT: 13 U/L (ref 0–44)
AST: 20 U/L (ref 15–41)
Albumin: 3.7 g/dL (ref 3.5–5.0)
Alkaline Phosphatase: 93 U/L (ref 38–126)
Anion gap: 7 (ref 5–15)
BUN: 33 mg/dL — ABNORMAL HIGH (ref 8–23)
CO2: 18 mmol/L — ABNORMAL LOW (ref 22–32)
Calcium: 9 mg/dL (ref 8.9–10.3)
Chloride: 116 mmol/L — ABNORMAL HIGH (ref 98–111)
Creatinine, Ser: 1.62 mg/dL — ABNORMAL HIGH (ref 0.44–1.00)
GFR, Estimated: 32 mL/min — ABNORMAL LOW (ref >60)
Glucose, Bld: 113 mg/dL — ABNORMAL HIGH (ref 70–99)
Potassium: 5.2 mmol/L — ABNORMAL HIGH (ref 3.5–5.1)
Sodium: 141 mmol/L (ref 135–145)
Total Bilirubin: 1.2 mg/dL (ref 0.3–1.2)
Total Protein: 6.9 g/dL (ref 6.5–8.1)

## 2022-08-02 LAB — BRAIN NATRIURETIC PEPTIDE: B Natriuretic Peptide: 740.4 pg/mL — ABNORMAL HIGH (ref 0.0–100.0)

## 2022-08-02 MED ORDER — PANTOPRAZOLE SODIUM 40 MG IV SOLR
40.0000 mg | Freq: Two times a day (BID) | INTRAVENOUS | Status: DC
  Administered 2022-08-02 – 2022-08-03 (×3): 40 mg via INTRAVENOUS
  Filled 2022-08-02 (×3): qty 10

## 2022-08-02 MED ORDER — SODIUM CHLORIDE 0.9% FLUSH: 3.0000 mL | INTRAVENOUS | Status: DC | PRN

## 2022-08-02 MED ORDER — ONDANSETRON HCL 4 MG/2ML IJ SOLN: 4.0000 mg | Freq: Four times a day (QID) | INTRAMUSCULAR | Status: DC | PRN

## 2022-08-02 MED ORDER — ACETAMINOPHEN 325 MG PO TABS: 650.0000 mg | ORAL_TABLET | ORAL | Status: DC | PRN

## 2022-08-02 MED ORDER — IOHEXOL 350 MG/ML SOLN
75.0000 mL | Freq: Once | INTRAVENOUS | Status: AC | PRN
  Administered 2022-08-02: 75 mL via INTRAVENOUS

## 2022-08-02 MED ORDER — CARVEDILOL 6.25 MG PO TABS
6.2500 mg | ORAL_TABLET | Freq: Two times a day (BID) | ORAL | Status: DC
  Administered 2022-08-03 (×2): 6.25 mg via ORAL
  Filled 2022-08-02 (×2): qty 1

## 2022-08-02 MED ORDER — ADULT MULTIVITAMIN W/MINERALS CH
1.0000 | ORAL_TABLET | Freq: Every day | ORAL | Status: DC
  Administered 2022-08-03 – 2022-08-04 (×2): 1 via ORAL
  Filled 2022-08-02 (×2): qty 1

## 2022-08-02 MED ORDER — SODIUM CHLORIDE 0.9 % IV SOLN: 250.0000 mL | INTRAVENOUS | Status: DC | PRN

## 2022-08-02 MED ORDER — FUROSEMIDE 10 MG/ML IJ SOLN
40.0000 mg | Freq: Two times a day (BID) | INTRAMUSCULAR | Status: DC
  Administered 2022-08-03 – 2022-08-04 (×3): 40 mg via INTRAVENOUS
  Filled 2022-08-02 (×3): qty 4

## 2022-08-02 MED ORDER — CETIRIZINE HCL 10 MG PO TABS
10.0000 mg | ORAL_TABLET | Freq: Every evening | ORAL | Status: DC
  Administered 2022-08-03 (×2): 10 mg via ORAL
  Filled 2022-08-02 (×3): qty 1

## 2022-08-02 MED ORDER — ROSUVASTATIN CALCIUM 10 MG PO TABS
40.0000 mg | ORAL_TABLET | Freq: Every day | ORAL | Status: DC
  Administered 2022-08-03 – 2022-08-04 (×2): 40 mg via ORAL
  Filled 2022-08-02: qty 4
  Filled 2022-08-02: qty 2

## 2022-08-02 MED ORDER — FUROSEMIDE 10 MG/ML IJ SOLN
40.0000 mg | Freq: Once | INTRAMUSCULAR | Status: AC
  Administered 2022-08-02: 40 mg via INTRAVENOUS
  Filled 2022-08-02: qty 4

## 2022-08-02 MED ORDER — HYDROCODONE-ACETAMINOPHEN 5-325 MG PO TABS: 1.0000 | ORAL_TABLET | ORAL | Status: DC | PRN

## 2022-08-02 MED ORDER — SODIUM CHLORIDE 0.9% FLUSH
3.0000 mL | Freq: Two times a day (BID) | INTRAVENOUS | Status: DC
  Administered 2022-08-03 (×3): 3 mL via INTRAVENOUS

## 2022-08-02 MED ORDER — SODIUM CHLORIDE 0.9 % IV SOLN
Freq: Once | INTRAVENOUS | Status: AC
  Administered 2022-08-02: 1000 mL via INTRAVENOUS

## 2022-08-02 NOTE — ED Provider Triage Note (Signed)
Emergency Medicine Provider Triage Evaluation Note  SERINA NICHTER, a 81 y.o. adult  was evaluated in triage.  Pt history of CHF, complains of shortness of breath.  Reports onset 2 days prior patient also reports some associated chest pain as well as some diarrhea.  Review of Systems  Positive: SOB, CP Negative: FCS  Physical Exam  BP (!) 167/89 (BP Location: Left Arm)   Pulse 60   Temp 98.2 F (36.8 C) (Oral)   Resp 18   SpO2 100%  Gen:   Awake, no distress  NAD Resp:  Normal effort CTA MSK:   Moves extremities without difficulty  Other:    Medical Decision Making  Medically screening exam initiated at 4:40 PM.  Appropriate orders placed.  [name] was informed that the remainder of the evaluation will be completed by another provider, this initial triage assessment does not replace that evaluation, and the importance of remaining in the ED until their evaluation is complete.  Geriatric patient to the ED for evaluation of chest pain and shortness of breath the last 2 days.   Melvenia Needles, PA-C 08/02/22 1641

## 2022-08-02 NOTE — H&P (Addendum)
History and Physical    Kimberly Henry YQI:[id number] DOB: 1940/11/21 DOA: 08/02/2022  I have briefly reviewed the patient's prior medical records in [place]  PCP: Steele Sizer, MD  Patient coming from: home  Chief Complaint: shortness of breath   HPI: Kimberly Henry is a 81 y.o. adult with medical history significant of chronic systolic CHF, PAF on Eliquis, HTN, HLD, obesity, PAH, who comes to the hospital with complaints of shortness of breath.  Patient noticed that over the last 3 to 4 days she has been getting progressively dyspneic, now barely able to ambulate a few steps without getting short of breath.  She also reports that she has noticed some fluid retention in her legs and weight gain, however unable to quantify how much.  Over the last 2 days, she has also noticed black stools, which are new for her.  She denies any nausea, vomiting, denies any abdominal pain.  She denies any fever or chills.  She denies ever having black stools in the past.  She does not smoke nor drinks.  She reports that she had a colonoscopy more than 10 years ago, does not recall who did it.  She denies any chest pain.  She denies any fever or chills, sore throat, runny nose.  She reports dietary nonadherence to low-sodium diet and often eats restaurant food  ED Course: In the emergency room she is afebrile, blood pressure in the 160s-170s, satting well on room air.  Blood work shows a potassium of 5.2, BUN 33 and creatinine 1.6.  BNP is elevated 740.  Hemoglobin is 9.7.  She underwent a CT angiogram of the chest which was negative for PE but did show pulmonary edema.  She was given a dose of Lasix and we are asked to admit.  Review of Systems: All systems reviewed, and apart from HPI, all negative  Past Medical History:  Diagnosis Date   Acid phosphatase elevated    Aortic atherosclerosis ([place])    Atrial fibrillation ([place])    a.) CHA2DS2VASc = 6 (age x 2, sex, HFrEF, HTN, vascular disease  history);  b.) rate/rhythm maintained on oral carvedilol; chronically anticoagulated with apixaban   AV block, 1st degree    Cervical cancer ([place]) 1999   COPD (chronic obstructive pulmonary disease) ([place])    HFrEF (heart failure with reduced ejection fraction) ([place])    a.) 04/2017 Echo: EF 30-35%, antsept HK, mild MR, mod dil LA, mildly dil RA, mod TR, mildly to mod elev PASP; b.) 08/2017 Echo: EF 30-35%, diff HK, antsept HK. Gr1 DD. Mild to mod MR. PASP nl; c.) TTE 01/22/2020: EF 50-55%, mild LAE, triv MR; RVSP 38.3   Hx of cervical malignancy    Hyperlipidemia    Hypertension    LBBB (left bundle branch block)    Long term current use of anticoagulant    a.) apixaban   Metabolic syndrome    NICM (nonischemic cardiomyopathy) ([place])    a.) 04/2017 Echo: EF 30-35%; b.) 04/2017 Cath: nonobs dzs; c.) 08/2017 Echo: EF 30-35%, diff HK, antsept HK. Gr1 DD. Mild to mod MR. PASP nl; d.) TTE 01/22/2020: EF 50-55%, mild LAE, triv MR, RVSP 38.3   Non-obstructive CAD (coronary artery disease)    a. 04/2017 Cath: mild nonobs CAD. EF 25-35%. CO 3.14, CI 1.38. Sev PAH [61/34(47].   Obesity, Class III, BMI 40-49.9 (morbid obesity) ([facility])    Osteoarthritis of both knees    PAH (pulmonary arterial hypertension) with portal hypertension ([place])  a.) 04/2017 Right Heart Cath: Sev PAH 61/34(47); b.) 08/2017 Echo: nl PASP; c.) TTE 01/22/2020: RVSP 38.3    Past Surgical History:  Procedure Laterality Date   ABDOMINAL HYSTERECTOMY  1996   CARDIAC CATHETERIZATION  2018   d/t CHF   CATARACT EXTRACTION Left 2008   CATARACT EXTRACTION W/PHACO Right 03/11/2021   Procedure: CATARACT EXTRACTION PHACO AND INTRAOCULAR LENS PLACEMENT ([place]) RIGHT;  Surgeon: Leandrew Koyanagi, MD;  Location: [facility];  Service: Ophthalmology;  Laterality: Right;  7.70 01:31.1   JOINT REPLACEMENT Right 06/2020   KNEE CLOSED REDUCTION Right 10/29/2020   Procedure: CLOSED MANIPULATION KNEE;  Surgeon: Lovell Sheehan, MD;  Location:  [facility];  Service: Orthopedics;  Laterality: Right;   RIGHT/LEFT HEART CATH AND CORONARY ANGIOGRAPHY N/A 04/21/2017   Procedure: RIGHT/LEFT HEART CATH AND CORONARY ANGIOGRAPHY;  Surgeon: Wellington Hampshire, MD;  Location: [facility];  Service: Cardiovascular;  Laterality: N/A;   TOTAL KNEE ARTHROPLASTY Right 07/02/2020   Procedure: TOTAL KNEE ARTHROPLASTY;  Surgeon: Lovell Sheehan, MD;  Location: [facility];  Service: Orthopedics;  Laterality: Right;   TOTAL KNEE ARTHROPLASTY Left 06/08/2022   Procedure: TOTAL KNEE ARTHROPLASTY;  Surgeon: Lovell Sheehan, MD;  Location: [facility];  Service: Orthopedics;  Laterality: Left;     reports that she quit smoking about 36 years ago. Her smoking use included cigarettes. She started smoking about 46 years ago. She has never used smokeless tobacco. She reports that she does not drink alcohol and does not use drugs.  No Known Allergies  Family History  Problem Relation Age of Onset   Hypertension Mother    Congestive Heart Failure Mother    Congestive Heart Failure Father    Prostate cancer Son     Prior to Admission medications   Medication Sig Start Date End Date Taking? Authorizing Provider  carvedilol (COREG) 6.25 MG tablet TAKE 1 TABLET BY MOUTH TWICE  DAILY WITH MEALS 01/22/22  Yes Wellington Hampshire, MD  ELIQUIS 5 MG TABS tablet TAKE 1 TABLET BY MOUTH  TWICE DAILY 01/22/22  Yes Wellington Hampshire, MD  ENTRESTO 97-103 MG TAKE 1 TABLET BY MOUTH TWICE  DAILY 01/22/22  Yes Wellington Hampshire, MD  levocetirizine (XYZAL) 5 MG tablet TAKE 1 TABLET BY MOUTH IN THE  EVENING 06/25/22  Yes Sowles, Drue Stager, MD  Multiple Vitamin (MULTIVITAMIN WITH MINERALS) TABS tablet Take 1 tablet by mouth daily.   Yes [provider]  potassium chloride SA (KLOR-CON M) 20 MEQ tablet TAKE 1 TABLET BY MOUTH DAILY 06/21/22  Yes Wellington Hampshire, MD  rosuvastatin (CRESTOR) 40 MG tablet TAKE 1 TABLET BY MOUTH DAILY 04/26/22  Yes Wellington Hampshire, MD  spironolactone  (ALDACTONE) 25 MG tablet TAKE 1 TABLET BY MOUTH DAILY 01/22/22  Yes Wellington Hampshire, MD  bisacodyl (DULCOLAX) 10 MG suppository Place 1 suppository (10 mg total) rectally daily as needed for moderate constipation. 06/09/22   Carlynn Spry, PA-C  HYDROcodone-acetaminophen (NORCO/VICODIN) 5-325 MG tablet Take 1 tablet by mouth every 4 (four) hours as needed for moderate pain (pain score 4-6). 06/09/22   Carlynn Spry, PA-C    Physical Exam: Vitals:   08/02/22 1554 08/02/22 1910 08/02/22 2130  BP: (!) 167/89 (!) 178/90 (!) 162/65  Pulse: 60 86 73  Resp: '18 18 18  '$ Temp: 98.2 F (36.8 C) 98.3 F (36.8 C)   TempSrc: Oral Oral   SpO2: 100% 99% 97%    Constitutional: NAD, calm, comfortable Eyes: PERRL,  lids and conjunctivae normal ENMT: Mucous membranes are moist.  Neck: normal, supple Respiratory: Bibasilar crackles present, no wheezing.  Normal respiratory effort Cardiovascular: Regular rate and rhythm, no murmurs / rubs / gallops.  2+ pitting lower extremity edema Abdomen: no tenderness, no masses palpated. Bowel sounds positive.  Musculoskeletal: no clubbing / cyanosis. Normal muscle tone.  Skin: no rashes, lesions, ulcers. No induration Neurologic: CN 2-12 grossly intact. Strength 5/5 in all 4.  Psychiatric: Normal judgment and insight. Alert and oriented x 3. Normal mood.   Labs on Admission: I have personally reviewed following labs and imaging studies  CBC: Recent Labs  Lab 08/02/22 1552  WBC 5.4  HGB 9.7*  HCT 29.5*  MCV 94.9  PLT 409   Basic Metabolic Panel: Recent Labs  Lab 08/02/22 1552  NA 141  K 5.2*  CL 116*  CO2 18*  GLUCOSE 113*  BUN 33*  CREATININE 1.62*  CALCIUM 9.0   Liver Function Tests: Recent Labs  Lab 08/02/22 1552  AST 20  ALT 13  ALKPHOS 93  BILITOT 1.2  PROT 6.9  ALBUMIN 3.7   Coagulation Profile: No results for input(s): "INR", "PROTIME" in the last 168 hours. BNP (last 3 results) No results for input(s): "PROBNP" in the last  8760 hours. CBG: No results for input(s): "GLUCAP" in the last 168 hours. Thyroid Function Tests: No results for input(s): "TSH", "T4TOTAL", "FREET4", "T3FREE", "THYROIDAB" in the last 72 hours. Urine analysis:    Component Value Date/Time   COLORURINE YELLOW (A) 05/27/2022 1028   APPEARANCEUR CLOUDY (A) 05/27/2022 1028   LABSPEC 1.028 05/27/2022 1028   PHURINE 5.0 05/27/2022 1028   GLUCOSEU NEGATIVE 05/27/2022 1028   HGBUR SMALL (A) 05/27/2022 1028   BILIRUBINUR NEGATIVE 05/27/2022 1028   BILIRUBINUR neg 06/18/2020 0934   KETONESUR NEGATIVE 05/27/2022 1028   PROTEINUR 100 (A) 05/27/2022 1028   UROBILINOGEN 0.2 06/18/2020 0934   NITRITE NEGATIVE 05/27/2022 1028   LEUKOCYTESUR LARGE (A) 05/27/2022 1028     Radiological Exams on Admission: CT Angio Chest PE W and/or Wo Contrast  Result Date: 08/02/2022 CLINICAL DATA:  PE suspected, shortness of breath for 2 days, chest pain EXAM: CT ANGIOGRAPHY CHEST WITH CONTRAST TECHNIQUE: Multidetector CT imaging of the chest was performed using the standard protocol during bolus administration of intravenous contrast. Multiplanar CT image reconstructions and MIPs were obtained to evaluate the vascular anatomy. RADIATION DOSE REDUCTION: This exam was performed according to the departmental dose-optimization program which includes automated exposure control, adjustment of the mA and/or kV according to patient size and/or use of iterative reconstruction technique. CONTRAST:  85m OMNIPAQUE IOHEXOL 350 MG/ML SOLN COMPARISON:  01/21/2020 FINDINGS: Cardiovascular: Satisfactory opacification of the pulmonary arteries to the segmental level. No evidence of pulmonary embolism. Gross cardiomegaly. Left and right coronary artery calcifications. Enlargement of the main pulmonary artery measuring up to 3.6 cm in caliber. No pericardial effusion. Aortic atherosclerosis. Mediastinum/Nodes: No enlarged mediastinal, hilar, or axillary lymph nodes. Thyroid gland,  trachea, and esophagus demonstrate no significant findings. Lungs/Pleura: Diffuse bilateral bronchial wall thickening, interlobular septal thickening trace bilateral pleural effusions. Upper Abdomen: No acute abnormality. Musculoskeletal: No chest wall abnormality. No acute osseous findings. Disc degenerative disease of the thoracic spine with exaggerated thoracic kyphosis. Review of the MIP images confirms the above findings. IMPRESSION: 1. Negative examination for pulmonary embolism. 2. Diffuse bilateral bronchial wall thickening, interlobular septal thickening, and trace bilateral pleural effusions, consistent with pulmonary edema. 3. Gross cardiomegaly and coronary artery disease. 4. Enlargement of the  main pulmonary artery, as can be seen in pulmonary hypertension. Aortic Atherosclerosis (ICD10-I70.0). Electronically Signed   By: Delanna Ahmadi M.D.   On: 08/02/2022 20:28   DG Chest Port 1 View  Result Date: 08/02/2022 CLINICAL DATA:  Shortness of breath EXAM: PORTABLE CHEST 1 VIEW COMPARISON:  Chest x-ray dated June 08, 2022 FINDINGS: Cardiac and mediastinal contours are unchanged. Both lungs are clear. The visualized skeletal structures are unremarkable. IMPRESSION: No active disease. Electronically Signed   By: Yetta Glassman M.D.   On: 08/02/2022 16:53    EKG: Independently reviewed.  Rate controlled A-fib  Assessment/Plan Principal problem Acute on chronic systolic CHF, acute pulmonary edema-most recent 2D echo was done in May 2021, showing EF 50-55%, improved from prior reading of 30-35%. -Admit to telemetry, placed on diuresis, strict ins and outs -Update 2D echo -Continue home Coreg  Active problems Melena, acute blood loss anemia-no prior history of GI bleed, hemoglobin in September 2023 was 12.1, now down to 9.7. GI consulted, sent message to Dr. Haig Prophet for a.m. consult, no need for urgent GI consultation overnight  Acute kidney injury-creatinine up to 1.6, most recent value  was 0.9 just 2 months ago.  Possibly in the setting of #1, closely monitor while on Lasix.  She received IV contrast with a CT angiogram on admission.  Mild hyperkalemia-she will be given Lasix.  Hold home potassium  PAF-hold home Eliquis, continue Coreg for rate control  Hyperlipidemia-resume statin once home medications are reconciled by pharmacy   DVT prophylaxis: SCDs Code Status: Full code Family Communication: Husband at bedside Disposition Plan: Home when ready Bed Type: Cardiac telemetry Consults called: GI Obs/Inp: Observation  Marzetta Board, MD, PhD [facility]  Contact via [email or web address]  08/02/2022, 9:48 PM

## 2022-08-02 NOTE — Progress Notes (Addendum)
Patient ID: [id number] TIRPAK, adult    DOB: 01/06/1941, 81 y.o.   MRN: [id number]  PCP: Steele Sizer, MD  Chief Complaint  Patient presents with   Diarrhea    Onset for 3 days   Shortness of Breath    Onset for 3 days, pt states has chest congestion.    Subjective:   Kimberly Henry is a 81 y.o. adult, presents to clinic with CC of the following:  Diarrhea  This is a new problem. Episode onset: 3d. The problem has been unchanged. Diarrhea characteristics: loose, no blood. Associated symptoms include chills and coughing. Pertinent negatives include no abdominal pain, arthralgias, bloating, fever, headaches, increased  flatus, myalgias, sweats, URI, vomiting or weight loss. Nothing aggravates the symptoms. She has tried nothing for the symptoms.  Shortness of Breath Associated symptoms include leg swelling, orthopnea, PND and wheezing. Pertinent negatives include no abdominal pain, chest pain, claudication, coryza, ear pain, fever, headaches, leg pain, neck pain, rash, sore throat, sputum production, swollen glands, syncope or vomiting. The symptoms are aggravated by any activity. Her past medical history is significant for a heart failure and a recent surgery.    Pt presents with 3 days of diarrhea, fatigue that stated to make her feel SOB She has decreased appetite, generally weak/fatigued/malaise  Just started coughing and wheezing today    Cardiac hx: Kimberly Henry is a 81 y.o. female who is here today for follow-up visit regarding chronic systolic heart failure due to nonischemic cardiomyopathy and chronic atrial fibrillation. She has known history of essential hypertension, obesity, COPD, hyperlipidemia and osteoarthritis.She is a previous smoker but quit many years ago. She was diagnosed with systolic heart failure in August of 2018.   She was noted to have left bundle branch block.  Cardiac catheterization at that time showed mild nonobstructive coronary artery disease with  an EF of 25-30%. Right heart catheterization showed severely elevated filling pressures, severely reduced cardiac output and severe pulmonary hypertension. She was treated with medical therapy and has done well with medications.  She declined CRT at some point given improvement in her symptoms.  Most recent echocardiogram in May 2021 showed an EF of 50 to 55%.  Left atrium was severely dilated.  She was diagnosed with atrial fibrillation around the same time and I felt that the chance of maintaining sinus rhythm was going to be low without an antiarrhythmic medication.  Given lack of symptoms related to atrial fibrillation, I continued rate control and she has been doing well.     I discontinued digoxin during last visit.  She has been doing well with no recent chest pain.  She does complain of exertional dyspnea.  She has not used Lasix in a while.  She gains 15 pounds over the last 6 months but she has no significant lower extremity edema.  She does admit to poor eating habit and she does not do much physical activities especially in the setting of severe left knee arthritis.   Weight up even with diarrhea and not eating much, BP high here today BP Readings from Last 3 Encounters:  08/02/22 (!) 162/96  06/09/22 (!) 127/55  05/27/22 134/68   Wt Readings from Last 5 Encounters:  08/02/22 287 lb 9.6 oz (130.5 kg)  06/08/22 283 lb 4.7 oz (128.5 kg)  05/27/22 283 lb 4.7 oz (128.5 kg)  05/24/22 283 lb 6.4 oz (128.5 kg)  02/26/22 282 lb (127.9 kg)   BMI Readings from Last 5 Encounters:  08/02/22 50.95 kg/m  06/08/22 50.18 kg/m  05/27/22 50.18 kg/m  05/24/22 50.20 kg/m  02/26/22 45.52 kg/m      Patient Active Problem List   Diagnosis Date Noted   S/P TKR (total knee replacement) using cement, left 06/08/2022   Status post knee replacement 06/08/2022   Stage 3a chronic kidney disease ([place]) 02/26/2022   Arthritis of left knee 06/09/2021   S/P TKR (total knee replacement) using cement,  right 07/02/2020   Simple chronic bronchitis (HCC)    CAD (coronary artery disease)    Chronic a-fib (HCC)    Bradycardia    Lung nodule seen on imaging study 01/09/2020   NICM (nonischemic cardiomyopathy) ([place]) 01/09/2020   Morbid obesity with BMI of 40.0-44.9, adult ([place]) 57/32/2025   Chronic systolic heart failure ([place]) 04/28/2017   Aortic atherosclerosis ([place]) 03/10/2017   Perennial allergic rhinitis with seasonal variation 05/30/2015   1st degree AV block 42/70/6237   Metabolic syndrome 62/83/1517   History of cervical cancer 03/03/2015   Hypertension, benign 03/03/2015   Osteoarthritis, knee 03/03/2015   Dyslipidemia 03/03/2015      Current Outpatient Medications:    bisacodyl (DULCOLAX) 10 MG suppository, Place 1 suppository (10 mg total) rectally daily as needed for moderate constipation., Disp: 30 suppository, Rfl: 0   carvedilol (COREG) 6.25 MG tablet, TAKE 1 TABLET BY MOUTH TWICE  DAILY WITH MEALS, Disp: 180 tablet, Rfl: 3   ELIQUIS 5 MG TABS tablet, TAKE 1 TABLET BY MOUTH  TWICE DAILY, Disp: 180 tablet, Rfl: 3   ENTRESTO 97-103 MG, TAKE 1 TABLET BY MOUTH TWICE  DAILY, Disp: 180 tablet, Rfl: 3   HYDROcodone-acetaminophen (NORCO/VICODIN) 5-325 MG tablet, Take 1 tablet by mouth every 4 (four) hours as needed for moderate pain (pain score 4-6)., Disp: 30 tablet, Rfl: 0   levocetirizine (XYZAL) 5 MG tablet, TAKE 1 TABLET BY MOUTH IN THE  EVENING, Disp: 80 tablet, Rfl: 0   Multiple Vitamin (MULTIVITAMIN WITH MINERALS) TABS tablet, Take 1 tablet by mouth daily., Disp: , Rfl:    potassium chloride SA (KLOR-CON M) 20 MEQ tablet, TAKE 1 TABLET BY MOUTH DAILY, Disp: 80 tablet, Rfl: 3   rosuvastatin (CRESTOR) 40 MG tablet, TAKE 1 TABLET BY MOUTH DAILY, Disp: 90 tablet, Rfl: 0   spironolactone (ALDACTONE) 25 MG tablet, TAKE 1 TABLET BY MOUTH DAILY, Disp: 90 tablet, Rfl: 3   No Known Allergies   Social History   Tobacco Use   Smoking status: Former    Years: 10.00    Types:  Cigarettes    Start date: 09/14/1975    Quit date: 06/22/1986    Years since quitting: 36.1   Smokeless tobacco: Never  Vaping Use   Vaping Use: Never used  Substance Use Topics   Alcohol use: No    Alcohol/week: 0.0 standard drinks of alcohol   Drug use: No      Chart Review Today: I personally reviewed active problem list, medication list, allergies, family history, social history, health maintenance, notes from last encounter, lab results, imaging with the patient/caregiver today.   Review of Systems  Constitutional:  Positive for chills. Negative for fever and weight loss.  HENT:  Negative for ear pain and sore throat.   Respiratory:  Positive for cough, shortness of breath and wheezing. Negative for sputum production.   Cardiovascular:  Positive for orthopnea, leg swelling and PND. Negative for chest pain, claudication and syncope.  Gastrointestinal:  Positive for diarrhea. Negative for abdominal pain, bloating, flatus and vomiting.  Musculoskeletal:  Negative for arthralgias, myalgias and neck pain.  Skin:  Negative for rash.  Neurological:  Negative for headaches.       Objective:   Vitals:   08/02/22 1412 08/02/22 1419  BP: (!) 168/98 (!) 162/96  Pulse: (!) 113 87  Resp: 16   Temp: 98.1 F (36.7 C)   TempSrc: Oral   SpO2: 98%   Weight: 287 lb 9.6 oz (130.5 kg)   Height: '5\' 3"'$  (1.6 m)     Body mass index is 50.95 kg/m.  Physical Exam Vitals and nursing note reviewed.  Constitutional:      General: She is not in acute distress.    Appearance: She is well-developed. She is obese. She is not ill-appearing or toxic-appearing.  HENT:     Head: Normocephalic and atraumatic.     Right Ear: External ear normal.     Left Ear: External ear normal.     Nose: Congestion present. No rhinorrhea.     Mouth/Throat:     Mouth: Mucous membranes are moist.     Pharynx: Oropharynx is clear. No pharyngeal swelling, oropharyngeal exudate or posterior oropharyngeal erythema.   Eyes:     General: No scleral icterus.       Right eye: No discharge.        Left eye: No discharge.     Conjunctiva/sclera: Conjunctivae normal.     Pupils: Pupils are equal, round, and reactive to light.  Neck:     Trachea: No tracheal deviation.  Cardiovascular:     Rate and Rhythm: Normal rate and regular rhythm.     Pulses: Normal pulses.     Heart sounds: Normal heart sounds. No murmur heard.    No friction rub. No gallop.  Pulmonary:     Effort: Pulmonary effort is normal. No respiratory distress.     Breath sounds: No stridor. No wheezing, rhonchi or rales.  Musculoskeletal:     Right lower leg: Edema present.     Left lower leg: Edema present.  Skin:    General: Skin is warm and dry.     Findings: No rash.  Neurological:     Mental Status: She is alert.     Motor: No abnormal muscle tone.     Coordination: Coordination normal.  Psychiatric:        Mood and Affect: Mood normal.        Behavior: Behavior normal.      Results for orders placed or performed during the hospital encounter of 06/08/22  Comprehensive metabolic panel  Result Value Ref Range   Sodium 141 135 - 145 mmol/L   Potassium 4.1 3.5 - 5.1 mmol/L   Chloride 108 98 - 111 mmol/L   CO2 26 22 - 32 mmol/L   Glucose, Bld 121 (H) 70 - 99 mg/dL   BUN 12 8 - 23 mg/dL   Creatinine, Ser 0.95 0.44 - 1.00 mg/dL   Calcium 9.2 8.9 - 10.3 mg/dL   Total Protein 7.4 6.5 - 8.1 g/dL   Albumin 4.0 3.5 - 5.0 g/dL   AST 25 15 - 41 U/L   ALT 21 0 - 44 U/L   Alkaline Phosphatase 94 38 - 126 U/L   Total Bilirubin 0.9 0.3 - 1.2 mg/dL   GFR, Estimated >60 >60 mL/min   Anion gap 7 5 - 15  CBC with Differential/Platelet  Result Value Ref Range   WBC 6.8 4.0 - 10.5 K/uL   RBC 3.90 3.87 - 5.11  MIL/uL   Hemoglobin 12.1 12.0 - 15.0 g/dL   HCT 37.4 36.0 - 46.0 %   MCV 95.9 80.0 - 100.0 fL   MCH 31.0 26.0 - 34.0 pg   MCHC 32.4 30.0 - 36.0 g/dL   RDW 11.9 11.5 - 15.5 %   Platelets 231 150 - 400 K/uL   nRBC 0.0 0.0 -  0.2 %   Neutrophils Relative % 82 %   Neutro Abs 5.6 1.7 - 7.7 K/uL   Lymphocytes Relative 16 %   Lymphs Abs 1.1 0.7 - 4.0 K/uL   Monocytes Relative 1 %   Monocytes Absolute 0.1 0.1 - 1.0 K/uL   Eosinophils Relative 1 %   Eosinophils Absolute 0.0 0.0 - 0.5 K/uL   Basophils Relative 0 %   Basophils Absolute 0.0 0.0 - 0.1 K/uL   Immature Granulocytes 0 %   Abs Immature Granulocytes 0.01 0.00 - 0.07 K/uL  Brain natriuretic peptide  Result Value Ref Range   B Natriuretic Peptide 290.7 (H) 0.0 - 100.0 pg/mL  Troponin I (High Sensitivity)  Result Value Ref Range   Troponin I (High Sensitivity) 11 <18 ng/L  Troponin I (High Sensitivity)  Result Value Ref Range   Troponin I (High Sensitivity) 11 <18 ng/L       Assessment & Plan:   Pt is a 81 y/o female presents to primary care clinic for eval of 3 d of diarrhea and progressive DOE She has history of heart failure, cardiomyopathy and A-fib, she is on spironolactone, Entresto, carvedilol, Eliquis and additional potassium supplement she endorses 3 days of dyspnea on exertion, no shortness of breath at rest she denies palpitations, chest pain, URI symptoms but over the last day she has started to cough and have some trouble breathing when she lays down she is also gained some weight in the past few days and has pitting edema despite not eating or drinking very much and having diarrhea.  We were going to try and do outpatient work-up with some blood work I told her we would possibly get an EKG or chest x-ray although his or her lungs did sound great, we first started with ambulatory pulse ox attempting to walk her around the clinic however she could not get down 1 hallway without having to stop and rest and she nearly fell over was very short of breath and she required assistance to get back to the exam room.  She did not have any desaturation but her heart rate on pulse ox would jump from low 100s to 140s and I was concerned for A-fib with RVR  possibly some fluid overload Being on spironolactone, potassium and having diarrhea I did not feel I could safely diurese her outpatient without having lab work done and I did not feel she was stable for waiting for outpatient labs for over a day so the decision was made for her to present to the ER  Labs and ECG were not done. Pt was assisted to her car where she drove herself - she refused EMS, would drive herself to the ER for eval. When resting for 5-10 min, her RR and HR would improve to within normal limits    ICD-10-CM   1. Diarrhea, unspecified type  N17.0 COMPLETE METABOLIC PANEL WITH GFR   decreased appetite, loose stool, pushing fluids, no abd pain    2. Chronic systolic heart failure (HCC)  I50.22 CBC with Differential/Platelet    COMPLETE METABOLIC PANEL WITH GFR    Brain natriuretic  peptide    3. DOE (dyspnea on exertion)  I50.27 COMPLETE METABOLIC PANEL WITH GFR    Brain natriuretic peptide   weight increase 4 lbs despite diarrhea and not eating much, BP high, DOE, none when at rest, pitting 2+ LE edema    4. Encounter for medication monitoring  Z51.81 CBC with Differential/Platelet    COMPLETE METABOLIC PANEL WITH GFR     Pt going to ED for DOE    Delsa Grana, PA-C 08/02/22 2:29 PM

## 2022-08-02 NOTE — ED Triage Notes (Signed)
Pt presents to the ED via POV due to SOB that started 2 days ago. Pt also c/o CP and diarrhea. Pt A&Ox4

## 2022-08-02 NOTE — ED Provider Notes (Signed)
[facility] Provider Note    Event Date/Time   First MD Initiated Contact with Patient 08/02/22 1903     (approximate)   History   Shortness of Breath   HPI  Kimberly Henry is a 81 y.o. adult with a history of CAD, COPD, atrial fibrillation on Eliquis, who presents with complaints of mild shortness of breath, dry cough over the last several days.  She reports before that she was feeling quite well.  Denies chest pain.  Does report that she had a left knee replacement about 6 weeks ago.  No pleurisy reported.  No fevers or chills     Physical Exam   Triage Vital Signs: ED Triage Vitals  Enc Vitals Group     BP 08/02/22 1554 (!) 167/89     Pulse Rate 08/02/22 1554 60     Resp 08/02/22 1554 18     Temp 08/02/22 1554 98.2 F (36.8 C)     Temp Source 08/02/22 1554 Oral     SpO2 08/02/22 1554 100 %     Weight --      Height --      Head Circumference --      Peak Flow --      Pain Score 08/02/22 1555 5     Pain Loc --      Pain Edu? --      Excl. in [place]? --     Most recent vital signs: Vitals:   08/02/22 1554 08/02/22 1910  BP: (!) 167/89 (!) 178/90  Pulse: 60 86  Resp: 18 18  Temp: 98.2 F (36.8 C) 98.3 F (36.8 C)  SpO2: 100% 99%     General: Awake, no distress.  CV:  Good peripheral perfusion.  Resp:  Normal effort.  Clear to auscultation bilaterally Abd:  No distention.  Other:  No lower extremity tenderness or swelling   ED Results / Procedures / Treatments   Labs (all labs ordered are listed, but only abnormal results are displayed) Labs Reviewed  COMPREHENSIVE METABOLIC PANEL - Abnormal; Notable for the following components:      Result Value   Potassium 5.2 (*)    Chloride 116 (*)    CO2 18 (*)    Glucose, Bld 113 (*)    BUN 33 (*)    Creatinine, Ser 1.62 (*)    GFR, Estimated 32 (*)    All other components within normal limits  CBC - Abnormal; Notable for the following components:   RBC 3.11 (*)    Hemoglobin  9.7 (*)    HCT 29.5 (*)    All other components within normal limits  BRAIN NATRIURETIC PEPTIDE - Abnormal; Notable for the following components:   B Natriuretic Peptide 740.4 (*)    All other components within normal limits     EKG  ED ECG REPORT I, Lavonia Drafts, the attending physician, personally viewed and interpreted this ECG.  Date: 08/02/2022  Rhythm: Atrial fibrillation QRS Axis: normal Intervals: Abnormal ST/T Wave abnormalities: normal Narrative Interpretation: no evidence of acute ischemia    RADIOLOGY Chest x-ray viewed interpreted by me, no pneumonia    PROCEDURES:  Critical Care performed:   Procedures   MEDICATIONS ORDERED IN ED: Medications  0.9 %  sodium chloride infusion (1,000 mLs Intravenous New Bag/Given 08/02/22 1951)  iohexol (OMNIPAQUE) 350 MG/ML injection 75 mL (75 mLs Intravenous Contrast Given 08/02/22 2019)     IMPRESSION / MDM / [place] / ED  COURSE  I reviewed the triage vital signs and the nursing notes. Patient's presentation is most consistent with acute presentation with potential threat to life or bodily function.  Patient presents with shortness of breath as detailed above.  Differential includes CHF, pneumonia, PE, COPD  Overall she is well-appearing and in no acute distress at this time, lab work is notable for elevated BUN/creatinine consistent with dehydration, elevated BNP.  Her hemoglobin is mildly decreased and when I questioned her she did reports that she has had loose stools over the last 2 days which were "darker than usual ".  Given that she is on Eliquis question whether she may be having a GI bleed which could be causing her shortness of breath.  However given her recent knee surgery will also obtain CTA to rule out PE  CT scan is for PE however consistent with pulmonary edema.  Rectal exam positive for blood,  I have consulted the hospitalist for admission        FINAL CLINICAL IMPRESSION(S)  / ED DIAGNOSES   Final diagnoses:  Congestive heart failure, unspecified HF chronicity, unspecified heart failure type ([place])  Gastrointestinal hemorrhage, unspecified gastrointestinal hemorrhage type     Rx / DC Orders   ED Discharge Orders     None        Note:  This document was prepared using Dragon voice recognition software and may include unintentional dictation errors.   Lavonia Drafts, MD 08/02/22 2045

## 2022-08-02 NOTE — Telephone Encounter (Signed)
  Chief Complaint: cough Symptoms: SOB with exertion Frequency: weekend Pertinent Negatives: Patient denies chest pain Disposition: '[]'$ ED /'[]'$ Urgent Care (no appt availability in office) / '[x]'$ Appointment(In office/virtual)/ '[]'$  [facility]/ '[]'$ Home Care/ '[]'$ Refused Recommended Disposition /'[]'$ [facility]/ '[]'$  Follow-up with PCP Additional Notes: pt to come to day for appt. Advised to call back if worsens. Reason for Disposition  [1] MILD difficulty breathing (e.g., minimal/no SOB at rest, SOB with walking, pulse <100) AND [2] NEW-onset or WORSE than normal  Answer Assessment - Initial Assessment Questions 1. RESPIRATORY STATUS: "Describe your breathing?" (e.g., wheezing, shortness of breath, unable to speak, severe coughing)      SOB 2. ONSET: "When did this breathing problem begin?"      Over the week 3. PATTERN "Does the difficult breathing come and go, or has it been constant since it started?"      Constant SOB with exertion pt stated she can sleep flat 4. SEVERITY: "How bad is your breathing?" (e.g., mild, moderate, severe)    - MILD: No SOB at rest, mild SOB with walking, speaks normally in sentences, can lie down, no retractions, pulse < 100.    - MODERATE: SOB at rest, SOB with minimal exertion and prefers to sit, cannot lie down flat, speaks in phrases, mild retractions, audible wheezing, pulse 100-120.    - SEVERE: Very SOB at rest, speaks in single words, struggling to breathe, sitting hunched forward, retractions, pulse > 120      mild 5. RECURRENT SYMPTOM: "Have you had difficulty breathing before?" If Yes, ask: "When was the last time?" and "What happened that time?"      N/a 6. CARDIAC HISTORY: "Do you have any history of heart disease?" (e.g., heart attack, angina, bypass surgery, angioplasty)      N/a 7. LUNG HISTORY: "Do you have any history of lung disease?"  (e.g., pulmonary embolus, asthma, emphysema)     N/a 8. CAUSE: "What do you think is causing the  breathing problem?"      Chest  9. OTHER SYMPTOMS: "Do you have any other symptoms? (e.g., dizziness, runny nose, cough, chest pain, fever)     Cough  10. O2 SATURATION MONITOR:  "Do you use an oxygen saturation monitor (pulse oximeter) at home?" If Yes, ask: "What is your reading (oxygen level) today?" "What is your usual oxygen saturation reading?" (e.g., 95%)       98 11. PREGNANCY: "Is there any chance you are pregnant?" "When was your last menstrual period?"       N/a 12. TRAVEL: "Have you traveled out of the country in the last month?" (e.g., travel history, exposures)       N/a  Protocols used: Breathing Difficulty-A-AH

## 2022-08-03 ENCOUNTER — Encounter: Payer: Self-pay | Admitting: Internal Medicine

## 2022-08-03 ENCOUNTER — Observation Stay (HOSPITAL_BASED_OUTPATIENT_CLINIC_OR_DEPARTMENT_OTHER)
Admit: 2022-08-03 | Discharge: 2022-08-03 | Disposition: A | Discharge status: Home / Self Care | Payer: Medicare Other | Attending: Internal Medicine | Admitting: Internal Medicine

## 2022-08-03 DIAGNOSIS — I5021 Acute systolic (congestive) heart failure: Secondary | ICD-10-CM | POA: Diagnosis not present

## 2022-08-03 DIAGNOSIS — I5033 Acute on chronic diastolic (congestive) heart failure: Secondary | ICD-10-CM | POA: Diagnosis not present

## 2022-08-03 DIAGNOSIS — D649 Anemia, unspecified: Secondary | ICD-10-CM | POA: Diagnosis not present

## 2022-08-03 DIAGNOSIS — K921 Melena: Secondary | ICD-10-CM | POA: Diagnosis present

## 2022-08-03 DIAGNOSIS — N179 Acute kidney failure, unspecified: Secondary | ICD-10-CM

## 2022-08-03 DIAGNOSIS — E875 Hyperkalemia: Secondary | ICD-10-CM

## 2022-08-03 LAB — ECHOCARDIOGRAM COMPLETE
AV Mean grad: 3 mmHg
AV Peak grad: 5.8 mmHg
Ao pk vel: 1.2 m/s
Area-P 1/2: 4.68 cm2
Calc EF: 47.8 %
Height: 63 in
S' Lateral: 3.2 cm
Single Plane A2C EF: 46.5 %
Single Plane A4C EF: 48.9 %
Weight: 4603.2 oz

## 2022-08-03 LAB — HEMOGLOBIN AND HEMATOCRIT, BLOOD
HCT: 28.9 % — ABNORMAL LOW (ref 36.0–46.0)
Hemoglobin: 9.7 g/dL — ABNORMAL LOW (ref 12.0–15.0)

## 2022-08-03 LAB — BASIC METABOLIC PANEL
Anion gap: 5 (ref 5–15)
BUN: 34 mg/dL — ABNORMAL HIGH (ref 8–23)
CO2: 21 mmol/L — ABNORMAL LOW (ref 22–32)
Calcium: 8.8 mg/dL — ABNORMAL LOW (ref 8.9–10.3)
Chloride: 116 mmol/L — ABNORMAL HIGH (ref 98–111)
Creatinine, Ser: 1.49 mg/dL — ABNORMAL HIGH (ref 0.44–1.00)
GFR, Estimated: 35 mL/min — ABNORMAL LOW (ref >60)
Glucose, Bld: 92 mg/dL (ref 70–99)
Potassium: 4.8 mmol/L (ref 3.5–5.1)
Sodium: 142 mmol/L (ref 135–145)

## 2022-08-03 MED ORDER — MELATONIN 5 MG PO TABS: 5.0000 mg | ORAL_TABLET | Freq: Once | ORAL | Status: DC

## 2022-08-03 NOTE — Consult Note (Signed)
Consultation  Referring Provider:     Renne Crigler Admit date 08/02/22 Consult date        08/03/22 Reason for Consultation:     melena         HPI:   Kimberly Henry is a 81 y.o. adult with medical history significant of chronic systolic CHF, PAF on Eliquis, HTN, HLD, obesity, PAH who presented to ED yesterday with increasing sob, activity intolerance, and swelling.  Noted to have chf exacerbation States she had some diarrhea on Saturday/Sunday that was brown and watery- this improved- reports having a soft sticky black bm yesterday. States this was a one time event and has had no further black stools. Denies any abdominal pain, NV, hematochezia. Patient denies dysphagia however her daughter who is at bedside expresses concern that she coughs frequently while eating solid foods - this has been an ongoing issue for several years Do note she was seen by her PCP for sob/activity intolerance along with diarrhea without fever/abdominal pain  x 3d yesterday- was sent to ED for further eval given her cardiorespiratory issues. Had left  TKR with Dr Harlow Mares 06/08/22- hgb at the time was 12.1- I am unsure if this is preprocedure or not. She is on eliquis for her AF/chf- states her last dose was yesterday pm. She has been started on Pantoprazole IV bid.  I do not appreciate that she has been on PPI at home. Denies NSAIDs.  Denies  problems with sedated procedures Had echocardiogram ordered yesterday- awaiting completion. Last ED 2021 was 50-55%. Note her CTA chest did not demonstrate any PE.  PREVIOUS ENDOSCOPIES:            States she had colonoscopy about 20y ago  No history of EGD Was treated for cervival cancer in the 1990s by Dr Fransisca Connors for cervical/uterine cancer   Past Medical History:  Diagnosis Date   Acid phosphatase elevated    Aortic atherosclerosis ([place])    Atrial fibrillation ([place])    a.) CHA2DS2VASc = 6 (age x 2, sex, HFrEF, HTN, vascular disease history);  b.) rate/rhythm maintained on oral  carvedilol; chronically anticoagulated with apixaban   AV block, 1st degree    Cervical cancer ([place]) 1999   COPD (chronic obstructive pulmonary disease) ([place])    HFrEF (heart failure with reduced ejection fraction) ([place])    a.) 04/2017 Echo: EF 30-35%, antsept HK, mild MR, mod dil LA, mildly dil RA, mod TR, mildly to mod elev PASP; b.) 08/2017 Echo: EF 30-35%, diff HK, antsept HK. Gr1 DD. Mild to mod MR. PASP nl; c.) TTE 01/22/2020: EF 50-55%, mild LAE, triv MR; RVSP 38.3   Hx of cervical malignancy    Hyperlipidemia    Hypertension    LBBB (left bundle branch block)    Long term current use of anticoagulant    a.) apixaban   Metabolic syndrome    NICM (nonischemic cardiomyopathy) ([place])    a.) 04/2017 Echo: EF 30-35%; b.) 04/2017 Cath: nonobs dzs; c.) 08/2017 Echo: EF 30-35%, diff HK, antsept HK. Gr1 DD. Mild to mod MR. PASP nl; d.) TTE 01/22/2020: EF 50-55%, mild LAE, triv MR, RVSP 38.3   Non-obstructive CAD (coronary artery disease)    a. 04/2017 Cath: mild nonobs CAD. EF 25-35%. CO 3.14, CI 1.38. Sev PAH [61/34(47].   Obesity, Class III, BMI 40-49.9 (morbid obesity) ([facility])    Osteoarthritis of both knees    PAH (pulmonary arterial hypertension) with portal hypertension ([place])    a.) 04/2017 Right Heart Cath:  Sev PAH 61/34(47); b.) 08/2017 Echo: nl PASP; c.) TTE 01/22/2020: RVSP 38.3    Past Surgical History:  Procedure Laterality Date   ABDOMINAL HYSTERECTOMY  1996   CARDIAC CATHETERIZATION  2018   d/t CHF   CATARACT EXTRACTION Left 2008   CATARACT EXTRACTION W/PHACO Right 03/11/2021   Procedure: CATARACT EXTRACTION PHACO AND INTRAOCULAR LENS PLACEMENT ([place]) RIGHT;  Surgeon: Leandrew Koyanagi, MD;  Location: [place];  Service: Ophthalmology;  Laterality: Right;  7.70 01:31.1   JOINT REPLACEMENT Right 06/2020   KNEE CLOSED REDUCTION Right 10/29/2020   Procedure: CLOSED MANIPULATION KNEE;  Surgeon: Lovell Sheehan, MD;  Location: [facility];  Service: Orthopedics;  Laterality:  Right;   RIGHT/LEFT HEART CATH AND CORONARY ANGIOGRAPHY N/A 04/21/2017   Procedure: RIGHT/LEFT HEART CATH AND CORONARY ANGIOGRAPHY;  Surgeon: Wellington Hampshire, MD;  Location: [facility] [facility];  Service: Cardiovascular;  Laterality: N/A;   TOTAL KNEE ARTHROPLASTY Right 07/02/2020   Procedure: TOTAL KNEE ARTHROPLASTY;  Surgeon: Lovell Sheehan, MD;  Location: [facility];  Service: Orthopedics;  Laterality: Right;   TOTAL KNEE ARTHROPLASTY Left 06/08/2022   Procedure: TOTAL KNEE ARTHROPLASTY;  Surgeon: Lovell Sheehan, MD;  Location: [facility];  Service: Orthopedics;  Laterality: Left;    Family History  Problem Relation Age of Onset   Hypertension Mother    Congestive Heart Failure Mother    Congestive Heart Failure Father    Prostate cancer Son      Social History   Tobacco Use   Smoking status: Former    Years: 10.00    Types: Cigarettes    Start date: 09/14/1975    Quit date: 06/22/1986    Years since quitting: 36.1   Smokeless tobacco: Never  Vaping Use   Vaping Use: Never used  Substance Use Topics   Alcohol use: No    Alcohol/week: 0.0 standard drinks of alcohol   Drug use: No    Prior to Admission medications   Medication Sig Start Date End Date Taking? Authorizing Provider  carvedilol (COREG) 6.25 MG tablet TAKE 1 TABLET BY MOUTH TWICE  DAILY WITH MEALS 01/22/22  Yes Wellington Hampshire, MD  ELIQUIS 5 MG TABS tablet TAKE 1 TABLET BY MOUTH  TWICE DAILY 01/22/22  Yes Wellington Hampshire, MD  ENTRESTO 97-103 MG TAKE 1 TABLET BY MOUTH TWICE  DAILY 01/22/22  Yes Wellington Hampshire, MD  levocetirizine (XYZAL) 5 MG tablet TAKE 1 TABLET BY MOUTH IN THE  EVENING 06/25/22  Yes Sowles, Drue Stager, MD  Multiple Vitamin (MULTIVITAMIN WITH MINERALS) TABS tablet Take 1 tablet by mouth daily.   Yes [provider]  potassium chloride SA (KLOR-CON M) 20 MEQ tablet TAKE 1 TABLET BY MOUTH DAILY 06/21/22  Yes Wellington Hampshire, MD  rosuvastatin (CRESTOR) 40 MG tablet TAKE 1 TABLET BY MOUTH  DAILY 04/26/22  Yes Wellington Hampshire, MD  spironolactone (ALDACTONE) 25 MG tablet TAKE 1 TABLET BY MOUTH DAILY 01/22/22  Yes Wellington Hampshire, MD  bisacodyl (DULCOLAX) 10 MG suppository Place 1 suppository (10 mg total) rectally daily as needed for moderate constipation. 06/09/22   Carlynn Spry, PA-C  HYDROcodone-acetaminophen (NORCO/VICODIN) 5-325 MG tablet Take 1 tablet by mouth every 4 (four) hours as needed for moderate pain (pain score 4-6). 06/09/22   Carlynn Spry, PA-C    Current Facility-Administered Medications  Medication Dose Route Frequency Provider Last Rate Last Admin   0.9 %  sodium chloride infusion  250 mL Intravenous PRN Gherghe, Costin M,  MD       acetaminophen (TYLENOL) tablet 650 mg  650 mg Oral Q4H PRN Caren Griffins, MD       carvedilol (COREG) tablet 6.25 mg  6.25 mg Oral BID WC Caren Griffins, MD       cetirizine (ZYRTEC) tablet 10 mg  10 mg Oral QPM Caren Griffins, MD   10 mg at 08/03/22 0024   furosemide (LASIX) injection 40 mg  40 mg Intravenous Q12H Caren Griffins, MD   40 mg at 08/03/22 0510   HYDROcodone-acetaminophen (NORCO/VICODIN) 5-325 MG per tablet 1 tablet  1 tablet Oral Q4H PRN Caren Griffins, MD       multivitamin with minerals tablet 1 tablet  1 tablet Oral Daily Gherghe, Vella Redhead, MD       ondansetron (ZOFRAN) injection 4 mg  4 mg Intravenous Q6H PRN Caren Griffins, MD       pantoprazole (PROTONIX) injection 40 mg  40 mg Intravenous Q12H Caren Griffins, MD   40 mg at 08/02/22 2326   rosuvastatin (CRESTOR) tablet 40 mg  40 mg Oral Daily Gherghe, Vella Redhead, MD       sodium chloride flush (NS) 0.9 % injection 3 mL  3 mL Intravenous Q12H Caren Griffins, MD   3 mL at 08/03/22 0024   sodium chloride flush (NS) 0.9 % injection 3 mL  3 mL Intravenous PRN Caren Griffins, MD       Current Outpatient Medications  Medication Sig Dispense Refill   carvedilol (COREG) 6.25 MG tablet TAKE 1 TABLET BY MOUTH TWICE  DAILY WITH MEALS 180 tablet  3   ELIQUIS 5 MG TABS tablet TAKE 1 TABLET BY MOUTH  TWICE DAILY 180 tablet 3   ENTRESTO 97-103 MG TAKE 1 TABLET BY MOUTH TWICE  DAILY 180 tablet 3   levocetirizine (XYZAL) 5 MG tablet TAKE 1 TABLET BY MOUTH IN THE  EVENING 80 tablet 0   Multiple Vitamin (MULTIVITAMIN WITH MINERALS) TABS tablet Take 1 tablet by mouth daily.     potassium chloride SA (KLOR-CON M) 20 MEQ tablet TAKE 1 TABLET BY MOUTH DAILY 80 tablet 3   rosuvastatin (CRESTOR) 40 MG tablet TAKE 1 TABLET BY MOUTH DAILY 90 tablet 0   spironolactone (ALDACTONE) 25 MG tablet TAKE 1 TABLET BY MOUTH DAILY 90 tablet 3   bisacodyl (DULCOLAX) 10 MG suppository Place 1 suppository (10 mg total) rectally daily as needed for moderate constipation. 30 suppository 0   HYDROcodone-acetaminophen (NORCO/VICODIN) 5-325 MG tablet Take 1 tablet by mouth every 4 (four) hours as needed for moderate pain (pain score 4-6). 30 tablet 0    Allergies as of 08/02/2022   (No Known Allergies)     Review of Systems:    All systems reviewed and negative except where noted in HPI.     Physical Exam:  Vital signs in last 24 hours: Temp:  [97.7 F (36.5 C)-98.3 F (36.8 C)] 98.1 F (36.7 C) (11/21 1245) Pulse Rate:  [56-113] 62 (11/21 0637) Resp:  [16-18] 18 (11/21 0637) BP: (109-178)/(45-98) 124/45 (11/21 0637) SpO2:  [97 %-100 %] 98 % (11/21 8099) Weight:  [130.5 kg] 130.5 kg (11/20 2334) Last BM Date : 08/02/22 General:   Pleasant elderly woman in NAD Head:  Normocephalic and atraumatic. Eyes:   No icterus.   Conjunctiva pink. Ears:  Normal auditory acuity. Mouth: Mucosa pink moist, no lesions. Neck:  Supple; no masses felt Lungs:  Respirations even and unlabored.  Lungs clear to auscultation bilaterally.   No wheezes, crackles, or rhonchi.  Heart:  S1S2, RRR, no MRG. No edema. Abdomen:   Flat, soft, nondistended, nontender. Normal bowel sounds. No appreciable masses or hepatomegaly. No rebound signs or other peritoneal signs. Rectal:  Not  performed.  Msk:  MAEW x4, No clubbing or cyanosis. Strength 5/5. Symmetrical without gross deformities. Neurologic:  Alert and  oriented x4;  Cranial nerves II-XII intact.  Skin:  Warm, dry, pink without significant lesions or rashes. Psych:  Alert and cooperative. Normal affect.  LAB RESULTS: Recent Labs    08/02/22 1552  WBC 5.4  HGB 9.7*  HCT 29.5*  PLT 210   BMET Recent Labs    08/02/22 1552 08/03/22 0510  NA 141 142  K 5.2* 4.8  CL 116* 116*  CO2 18* 21*  GLUCOSE 113* 92  BUN 33* 34*  CREATININE 1.62* 1.49*  CALCIUM 9.0 8.8*   LFT Recent Labs    08/02/22 1552  PROT 6.9  ALBUMIN 3.7  AST 20  ALT 13  ALKPHOS 93  BILITOT 1.2   PT/INR No results for input(s): "LABPROT", "INR" in the last 72 hours.  STUDIES: CT Angio Chest PE W and/or Wo Contrast  Result Date: 08/02/2022 CLINICAL DATA:  PE suspected, shortness of breath for 2 days, chest pain EXAM: CT ANGIOGRAPHY CHEST WITH CONTRAST TECHNIQUE: Multidetector CT imaging of the chest was performed using the standard protocol during bolus administration of intravenous contrast. Multiplanar CT image reconstructions and MIPs were obtained to evaluate the vascular anatomy. RADIATION DOSE REDUCTION: This exam was performed according to the departmental dose-optimization program which includes automated exposure control, adjustment of the mA and/or kV according to patient size and/or use of iterative reconstruction technique. CONTRAST:  41m OMNIPAQUE IOHEXOL 350 MG/ML SOLN COMPARISON:  01/21/2020 FINDINGS: Cardiovascular: Satisfactory opacification of the pulmonary arteries to the segmental level. No evidence of pulmonary embolism. Gross cardiomegaly. Left and right coronary artery calcifications. Enlargement of the main pulmonary artery measuring up to 3.6 cm in caliber. No pericardial effusion. Aortic atherosclerosis. Mediastinum/Nodes: No enlarged mediastinal, hilar, or axillary lymph nodes. Thyroid gland, trachea, and  esophagus demonstrate no significant findings. Lungs/Pleura: Diffuse bilateral bronchial wall thickening, interlobular septal thickening trace bilateral pleural effusions. Upper Abdomen: No acute abnormality. Musculoskeletal: No chest wall abnormality. No acute osseous findings. Disc degenerative disease of the thoracic spine with exaggerated thoracic kyphosis. Review of the MIP images confirms the above findings. IMPRESSION: 1. Negative examination for pulmonary embolism. 2. Diffuse bilateral bronchial wall thickening, interlobular septal thickening, and trace bilateral pleural effusions, consistent with pulmonary edema. 3. Gross cardiomegaly and coronary artery disease. 4. Enlargement of the main pulmonary artery, as can be seen in pulmonary hypertension. Aortic Atherosclerosis (ICD10-I70.0). Electronically Signed   By: ADelanna AhmadiM.D.   On: 08/02/2022 20:28   DG Chest Port 1 View  Result Date: 08/02/2022 CLINICAL DATA:  Shortness of breath EXAM: PORTABLE CHEST 1 VIEW COMPARISON:  Chest x-ray dated June 08, 2022 FINDINGS: Cardiac and mediastinal contours are unchanged. Both lungs are clear. The visualized skeletal structures are unremarkable. IMPRESSION: No active disease. Electronically Signed   By: LYetta GlassmanM.D.   On: 08/02/2022 16:53       Impression / Plan:   Melena/anemia- has had recent surgery- could have stress ulcer v other. She has had recent TKR- it is possible she could has lost some blood there as well but this is unclear and would not give her melena. She is hemodynamically  stable. Will order serial hemoglobins, recommending transfusing to keep hgb >7. Do think she would benefit from EGD- will need to see about timing.  Thank you very much for this consult. These services were provided by Stephens November, NP-C, in collaboration with Lesly Rubenstein, MD, with whom I have discussed this patient in full.   Stephens November, NP-C

## 2022-08-03 NOTE — Progress Notes (Signed)
       CROSS COVER NOTE  NAME: Kimberly Henry MRN: [id number] DOB : 11-Jun-1941 ATTENDING PHYSICIAN: Lurene Shadow, MD    Date of Service   08/03/2022   HPI/Events of Note   Medication request received for sleep aid.  Interventions   Assessment/Plan:  Melatonin x1     This document was prepared using Dragon voice recognition software and may include unintentional dictation errors.  Bishop Limbo DNP, MBA, FNP-BC Nurse Practitioner [facility] Pager [phone]

## 2022-08-03 NOTE — Progress Notes (Signed)
*  PRELIMINARY RESULTS* Echocardiogram 2D Echocardiogram has been performed.  Kimberly Henry 08/03/2022, 11:49 AM

## 2022-08-03 NOTE — Evaluation (Signed)
Physical Therapy Evaluation Patient Details Name: Kimberly Henry MRN: [id number] DOB: 07/16/1941 Today's Date: 08/03/2022  History of Present Illness  81 yo female presents with acute on chronic diastolic CHF. PMH includes CAD, COPD, and atrial fibrillation on Eliquis  Clinical Impression  Pt found supine in bed upon PT entry with c/o soiled bedding. Pt required supervision for bed mobility. Sit<>Stand with RW initially and then repeated without AD. Pt maintained static standing balance during clothing management. Pt ambulated 67 ft with supervision and RW. The last 10 ft were without AD. No LOB noted. Pt would benefit from skilled physical therapy to address the listed deficits (see below) to increase independence with ADLs and function. Current recommendation is home with intermittent assistance and outpatient PT to return pt to PLOF.        Recommendations for follow up therapy are one component of a multi-disciplinary discharge planning process, led by the attending physician.  Recommendations may be updated based on patient status, additional functional criteria and insurance authorization.  Follow Up Recommendations Outpatient PT      Assistance Recommended at Discharge Intermittent Supervision/Assistance  Patient can return home with the following  A little help with bathing/dressing/bathroom;Assistance with cooking/housework;Assist for transportation;Help with stairs or ramp for entrance    Equipment Recommendations None recommended by PT  Recommendations for Other Services       Functional Status Assessment Patient has had a recent decline in their functional status and demonstrates the ability to make significant improvements in function in a reasonable and predictable amount of time.     Precautions / Restrictions Precautions Precautions: Fall Restrictions Weight Bearing Restrictions: No      Mobility  Bed Mobility Overal bed mobility: Modified Independent              General bed mobility comments: HOB elevated. increased time    Transfers Overall transfer level: Needs assistance Equipment used: Rolling walker (2 wheels) Transfers: Sit to/from Stand Sit to Stand: Supervision           General transfer comment: initially used RW and then STS without AD    Ambulation/Gait Ambulation/Gait assistance: Supervision Gait Distance (Feet): 70 Feet Assistive device: Rolling walker (2 wheels) Gait Pattern/deviations: WFL(Within Functional Limits)       General Gait Details: used no AD for the last 10 ft  Stairs            Wheelchair Mobility    Modified Rankin (Stroke Patients Only)       Balance Overall balance assessment: Needs assistance Sitting-balance support: Feet supported Sitting balance-Leahy Scale: Good     Standing balance support: No upper extremity supported   Standing balance comment: able to maintain static balance during clothing management                             Pertinent Vitals/Pain Pain Assessment Pain Assessment: Faces Faces Pain Scale: No hurt    Home Living Family/patient expects to be discharged to:: Private residence Living Arrangements: Children Available Help at Discharge: Family;Available 24 hours/day Type of Home: House Home Access: Stairs to enter Entrance Stairs-Rails: Can reach both;Left;Right Entrance Stairs-Number of Steps: 3   Home Layout: One level Home Equipment: Conservation officer, nature (2 wheels);Shower seat;Cane - single point      Prior Function Prior Level of Function : Independent/Modified Independent  Hand Dominance        Extremity/Trunk Assessment   Upper Extremity Assessment Upper Extremity Assessment: Overall WFL for tasks assessed    Lower Extremity Assessment Lower Extremity Assessment: Generalized weakness    Cervical / Trunk Assessment Cervical / Trunk Assessment: Normal  Communication   Communication: No  difficulties  Cognition Arousal/Alertness: Awake/alert Behavior During Therapy: WFL for tasks assessed/performed Overall Cognitive Status: Within Functional Limits for tasks assessed                                          General Comments      Exercises     Assessment/Plan    PT Assessment Patient needs continued PT services  PT Problem List Decreased strength;Decreased balance;Cardiopulmonary status limiting activity;Decreased activity tolerance       PT Treatment Interventions DME instruction;Functional mobility training;Balance training;Patient/family education;Gait training;Therapeutic activities;Neuromuscular re-education;Stair training;Therapeutic exercise    PT Goals (Current goals can be found in the Care Plan section)  Acute Rehab PT Goals Patient Stated Goal: to return home PT Goal Formulation: With patient Time For Goal Achievement: 08/17/22 Potential to Achieve Goals: Good    Frequency Min 2X/week     Co-evaluation               AM-PAC PT "6 Clicks" Mobility  Outcome Measure Help needed turning from your back to your side while in a flat bed without using bedrails?: None Help needed moving from lying on your back to sitting on the side of a flat bed without using bedrails?: None Help needed moving to and from a bed to a chair (including a wheelchair)?: None Help needed standing up from a chair using your arms (e.g., wheelchair or bedside chair)?: None Help needed to walk in hospital room?: A Little Help needed climbing 3-5 steps with a railing? : A Little 6 Click Score: 22    End of Session Equipment Utilized During Treatment: Gait belt Activity Tolerance: Patient tolerated treatment well Patient left: in bed;with call bell/phone within reach;with bed alarm set Nurse Communication: Mobility status PT Visit Diagnosis: Other abnormalities of gait and mobility (R26.89);Difficulty in walking, not elsewhere classified  (R26.2);Unsteadiness on feet (R26.81)    Time: 8469-6295 PT Time Calculation (min) (ACUTE ONLY): 28 min   Charges:             Aletha Halim, SPT  08/03/2022, 2:47 PM

## 2022-08-03 NOTE — Progress Notes (Addendum)
Progress Note    Kimberly Henry  NUU:[id number] DOB: 1941-06-22  DOA: 08/02/2022 PCP: Steele Sizer, MD      Brief Narrative:    Medical records reviewed and are as summarized below:  Kimberly Henry is a 81 y.o. adult with medical history significant of chronic systolic CHF, PAF on Eliquis, HTN, HLD, obesity, PAH, who presented to the hospital with shortness of breath, leg swelling, weight gain and diarrhea.  Symptoms have been going on for about 3 days and have progressively worsened.  She noticed that his stools were dark the day prior to admission.       Assessment/Plan:   Principal Problem:   Acute on chronic diastolic CHF (congestive heart failure) (HCC) Active Problems:   AKI (acute kidney injury) (HCC)   Hyperkalemia   Melena    Body mass index is 50.96 kg/m.  (Morbid obesity)  Acute on chronic diastolic CHF: Continue IV Lasix.  Monitor BMP, daily weight and urine output.  She has history of chronic systolic CHF with prior EF of 30 to 35% in December 2018, which improved to 50 to 55% on 2D echo in May 2021.  2D echo is pending.  AKI complicated by hyperkalemia: Hyperkalemia has resolved and creatinine is improving.  Monitor BMP.  Baseline creatinine around 0.95  Melena, acute blood loss anemia: Eliquis on hold.  H&H is stable.  Continue IV Protonix.  Follow-up with gastroenterologist for further recommendations. Hemoglobin was 12.1 in September 2023 and down to 9.7 on admission.  Other comorbidities include paroxysmal atrial fibrillation, pulmonary artery hypertension, hyperlipidemia, hypertension    Diet Order             Diet NPO time specified  Diet effective midnight                            Consultants: Gastroenterologist  Procedures: None    Medications:    carvedilol  6.25 mg Oral BID WC   cetirizine  10 mg Oral QPM   furosemide  40 mg Intravenous Q12H   multivitamin with minerals  1 tablet Oral Daily    pantoprazole (PROTONIX) IV  40 mg Intravenous Q12H   rosuvastatin  40 mg Oral Daily   sodium chloride flush  3 mL Intravenous Q12H   Continuous Infusions:  sodium chloride       Anti-infectives (From admission, onward)    None              Family Communication/Anticipated D/C date and plan/Code Status   DVT prophylaxis:      Code Status: Full Code  Family Communication: Plan discussed with Adonis Brook, daughter, at the bedside Disposition Plan: Plan to discharge home in 2 to 3 days   Status is: Observation The patient will require care spanning > 2 midnights and should be moved to inpatient because: CHF exacerbation, melena       Subjective:   Interval events noted.  She feels better today.  No shortness of breath or chest pain.  She still has swelling in her legs.  No bowel movement today but she had "dark stools yesterday".  Adonis Brook, daughter, was at the bedside  Objective:    Vitals:   08/02/22 2334 08/03/22 0208 08/03/22 0637 08/03/22 0900  BP:  (!) 109/54 (!) 124/45 (!) 130/55  Pulse:  62 62 75  Resp:  '16 18 18  '$ Temp:   98.1 F (36.7 C) 98.3 F (36.8  C)  TempSrc:   Oral   SpO2:  98% 98%   Weight: 130.5 kg     Height: '5\' 3"'$  (1.6 m)      No data found.   Intake/Output Summary (Last 24 hours) at 08/03/2022 1213 Last data filed at 08/03/2022 7121 Gross per 24 hour  Intake 50 ml  Output 1000 ml  Net -950 ml   Filed Weights   08/02/22 2334  Weight: 130.5 kg    Exam:  GEN: NAD SKIN: Warm and dry EYES: No pallor or icterus ENT: MMM CV: Irregular rate and rhythm PULM: CTA B ABD: soft, obese, NT, +BS CNS: AAO x 3, non focal EXT: Bilateral leg and pedal edema, no erythema or tenderness edema or tenderness        Data Reviewed:   I have personally reviewed following labs and imaging studies:  Labs: Labs show the following:   Basic Metabolic Panel: Recent Labs  Lab 08/02/22 1552 08/03/22 0510  NA 141 142  K 5.2* 4.8  CL  116* 116*  CO2 18* 21*  GLUCOSE 113* 92  BUN 33* 34*  CREATININE 1.62* 1.49*  CALCIUM 9.0 8.8*   GFR Estimated Creatinine Clearance (by C-G formula based on SCr of 1.49 mg/dL (H)) Female: 39.1 mL/min (A) Female: 47.5 mL/min (A) Liver Function Tests: Recent Labs  Lab 08/02/22 1552  AST 20  ALT 13  ALKPHOS 93  BILITOT 1.2  PROT 6.9  ALBUMIN 3.7   No results for input(s): "LIPASE", "AMYLASE" in the last 168 hours. No results for input(s): "AMMONIA" in the last 168 hours. Coagulation profile No results for input(s): "INR", "PROTIME" in the last 168 hours.  CBC: Recent Labs  Lab 08/02/22 1552 08/03/22 1052  WBC 5.4  --   HGB 9.7* 9.7*  HCT 29.5* 28.9*  MCV 94.9  --   PLT 210  --    Cardiac Enzymes: No results for input(s): "CKTOTAL", "CKMB", "CKMBINDEX", "TROPONINI" in the last 168 hours. BNP (last 3 results) No results for input(s): "PROBNP" in the last 8760 hours. CBG: No results for input(s): "GLUCAP" in the last 168 hours. D-Dimer: No results for input(s): "DDIMER" in the last 72 hours. Hgb A1c: No results for input(s): "HGBA1C" in the last 72 hours. Lipid Profile: No results for input(s): "CHOL", "HDL", "LDLCALC", "TRIG", "CHOLHDL", "LDLDIRECT" in the last 72 hours. Thyroid function studies: No results for input(s): "TSH", "T4TOTAL", "T3FREE", "THYROIDAB" in the last 72 hours.  Invalid input(s): "FREET3" Anemia work up: No results for input(s): "VITAMINB12", "FOLATE", "FERRITIN", "TIBC", "IRON", "RETICCTPCT" in the last 72 hours. Sepsis Labs: Recent Labs  Lab 08/02/22 1552  WBC 5.4    Microbiology No results found for this or any previous visit (from the past 240 hour(s)).  Procedures and diagnostic studies:  CT Angio Chest PE W and/or Wo Contrast  Result Date: 08/02/2022 CLINICAL DATA:  PE suspected, shortness of breath for 2 days, chest pain EXAM: CT ANGIOGRAPHY CHEST WITH CONTRAST TECHNIQUE: Multidetector CT imaging of the chest was performed  using the standard protocol during bolus administration of intravenous contrast. Multiplanar CT image reconstructions and MIPs were obtained to evaluate the vascular anatomy. RADIATION DOSE REDUCTION: This exam was performed according to the departmental dose-optimization program which includes automated exposure control, adjustment of the mA and/or kV according to patient size and/or use of iterative reconstruction technique. CONTRAST:  14m OMNIPAQUE IOHEXOL 350 MG/ML SOLN COMPARISON:  01/21/2020 FINDINGS: Cardiovascular: Satisfactory opacification of the pulmonary arteries to the segmental level. No  evidence of pulmonary embolism. Gross cardiomegaly. Left and right coronary artery calcifications. Enlargement of the main pulmonary artery measuring up to 3.6 cm in caliber. No pericardial effusion. Aortic atherosclerosis. Mediastinum/Nodes: No enlarged mediastinal, hilar, or axillary lymph nodes. Thyroid gland, trachea, and esophagus demonstrate no significant findings. Lungs/Pleura: Diffuse bilateral bronchial wall thickening, interlobular septal thickening trace bilateral pleural effusions. Upper Abdomen: No acute abnormality. Musculoskeletal: No chest wall abnormality. No acute osseous findings. Disc degenerative disease of the thoracic spine with exaggerated thoracic kyphosis. Review of the MIP images confirms the above findings. IMPRESSION: 1. Negative examination for pulmonary embolism. 2. Diffuse bilateral bronchial wall thickening, interlobular septal thickening, and trace bilateral pleural effusions, consistent with pulmonary edema. 3. Gross cardiomegaly and coronary artery disease. 4. Enlargement of the main pulmonary artery, as can be seen in pulmonary hypertension. Aortic Atherosclerosis (ICD10-I70.0). Electronically Signed   By: Delanna Ahmadi M.D.   On: 08/02/2022 20:28   DG Chest Port 1 View  Result Date: 08/02/2022 CLINICAL DATA:  Shortness of breath EXAM: PORTABLE CHEST 1 VIEW COMPARISON:  Chest  x-ray dated June 08, 2022 FINDINGS: Cardiac and mediastinal contours are unchanged. Both lungs are clear. The visualized skeletal structures are unremarkable. IMPRESSION: No active disease. Electronically Signed   By: Yetta Glassman M.D.   On: 08/02/2022 16:53               LOS: 0 days   Sarah-Jane Nazario  [facility]   Pager on [email or web address]. If 7PM-7AM, please contact night-coverage at [email or web address]     08/03/2022, 12:13 PM

## 2022-08-03 NOTE — Discharge Instructions (Signed)
Low Sodium Nutrition Therapy  Eating less sodium can help you if you have high blood pressure, heart failure, or kidney or liver disease.   Your body needs a little sodium, but too much sodium can cause your body to hold onto extra water. This extra water will raise your blood pressure and can cause damage to your heart, kidneys, or liver as they are forced to work harder.   Sometimes you can see how the extra fluid affects you because your hands, legs, or belly swell. You may also hold water around your heart and lungs, which makes it hard to breathe.   Even if you take medication for blood pressure or a water pill (diuretic) to remove fluid, it is still important to have less salt in your diet.   Check with your primary care provider before drinking alcohol since it may affect the amount of fluid in your body and how your heart, kidneys, or liver work. Sodium in Food A low-sodium meal plan limits the sodium that you get from food and beverages to 1,500-2,000 milligrams (mg) per day. Salt is the main source of sodium. Read the nutrition label on the package to find out how much sodium is in one serving of a food.  Select foods with 140 milligrams (mg) of sodium or less per serving.  You may be able to eat one or two servings of foods with a little more than 140 milligrams (mg) of sodium if you are closely watching how much sodium you eat in a day.  Check the serving size on the label. The amount of sodium listed on the label shows the amount in one serving of the food. So, if you eat more than one serving, you will get more sodium than the amount listed.  Tips Cutting Back on Sodium Eat more fresh foods.  Fresh fruits and vegetables are low in sodium, as well as frozen vegetables and fruits that have no added juices or sauces.  Fresh meats are lower in sodium than processed meats, such as bacon, sausage, and hotdogs.  Not all processed foods are unhealthy, but some processed foods may have too  much sodium.  Eat less salt at the table and when cooking. One of the ingredients in salt is sodium.  One teaspoon of table salt has 2,300 milligrams of sodium.  Leave the salt out of recipes for pasta, casseroles, and soups. Be a Engineer, building services.  Food packages that say "Salt-free", sodium-free", "very low sodium," and "low sodium" have less than 140 milligrams of sodium per serving.  Beware of products identified as "Unsalted," "No Salt Added," "Reduced Sodium," or "Lower Sodium." These items may still be high in sodium. You should always check the nutrition label. Add flavors to your food without adding sodium.  Try lemon juice, lime juice, or vinegar.  Dry or fresh herbs add flavor.  Buy a sodium-free seasoning blend or make your own at home. You can purchase salt-free or sodium-free condiments like barbeque sauce in stores and online. Ask your registered dietitian nutritionist for recommendations and where to find them.   Eating in Restaurants Choose foods carefully when you eat outside your home. Restaurant foods can be very high in sodium. Many restaurants provide nutrition facts on their menus or their websites. If you cannot find that information, ask your server. Let your server know that you want your food to be cooked without salt and that you would like your salad dressing and sauces to be served on the  side.    Foods Recommended Food Group Foods Recommended  Grains Bread, bagels, rolls without salted tops Homemade bread made with reduced-sodium baking powder Cold cereals, especially shredded wheat and puffed rice Oats, grits, or cream of wheat Pastas, quinoa, and rice Popcorn, pretzels or crackers without salt Corn tortillas  Protein Foods Fresh meats and fish; [place] bacon (check the nutrition labels - make sure they are not packaged in a sodium solution) Canned or packed tuna (no more than 4 ounces at 1 serving) Beans and peas Soybeans) and tofu Eggs Nuts or nut butters  without salt  Dairy Milk or milk powder Plant milks, such as rice and soy Yogurt, including Greek yogurt Small amounts of natural cheese (blocks of cheese) or reduced-sodium cheese can be used in moderation. (Swiss, ricotta, and fresh mozzarella cheese are lower in sodium than the others) Cream Cheese Low sodium cottage cheese  Vegetables Fresh and frozen vegetables without added sauces or salt Homemade soups (without salt) Low-sodium, salt-free or sodium-free canned vegetables and soups  Fruit Fresh and canned fruits Dried fruits, such as raisins, cranberries, and prunes  Oils Tub or liquid margarine, regular or without salt Canola, corn, peanut, olive, safflower, or sunflower oils  Condiments Fresh or dried herbs such as basil, bay leaf, dill, mustard (dry), nutmeg, paprika, parsley, rosemary, sage, or thyme.  Low sodium ketchup Vinegar  Lemon or lime juice Pepper, red pepper flakes, and cayenne. Hot sauce contains sodium, but if you use just a drop or two, it will not add up to much.  Salt-free or sodium-free seasoning mixes and marinades Simple salad dressings: vinegar and oil   Foods Not Recommended Food Group Foods Not Recommended  Grains Breads or crackers topped with salt Cereals (hot/cold) with more than 300 mg sodium per serving Biscuits, cornbread, and other "quick" breads prepared with baking soda Pre-packaged bread crumbs Seasoned and packaged rice and pasta mixes Self-rising flours  Protein Foods Cured meats: Bacon, ham, sausage, pepperoni and hot dogs Canned meats (chili, vienna sausage, or sardines) Smoked fish and meats Frozen meals that have more than 600 mg of sodium per serving Egg substitute (with added sodium)  Dairy Buttermilk Processed cheese spreads Cottage cheese (1 cup may have over 500 mg of sodium; look for low-sodium.) American or feta cheese Shredded Cheese has more sodium than blocks of cheese String cheese  Vegetables Canned vegetables  (unless they are salt-free, sodium-free or low sodium) Frozen vegetables with seasoning and sauces Sauerkraut and pickled vegetables Canned or dried soups (unless they are salt-free, sodium-free, or low sodium) [place] fries and onion rings  Fruit Dried fruits preserved with additives that have sodium  Oils Salted butter or margarine, all types of olives  Condiments Salt, sea salt, kosher salt, onion salt, and garlic salt Seasoning mixes with salt Bouillon cubes Ketchup Barbeque sauce and Worcestershire sauce unless low sodium Soy sauce Salsa, pickles, olives, relish Salad dressings: ranch, blue cheese, [place], and [place].   Low Sodium Sample 1-Day Menu  Breakfast 1 cup cooked oatmeal  1 slice whole wheat bread toast  1 tablespoon peanut butter without salt  1 banana  1 cup 1% milk  Lunch Tacos made with: 2 corn tortillas   cup black beans, low sodium   cup roasted or grilled chicken (without skin)   avocado  Squeeze of lime juice  1 cup salad greens  1 tablespoon low-sodium salad dressing   cup strawberries  1 orange  Afternoon Snack 1/3 cup grapes  6 ounces yogurt  Evening Meal 3 ounces herb-baked fish  1 baked potato  2 teaspoons olive oil   cup cooked carrots  2 thick slices tomatoes on:  2 lettuce leaves  1 teaspoon olive oil  1 teaspoon balsamic vinegar  1 cup 1% milk  Evening Snack 1 apple   cup almonds without salt   Low-Sodium Vegetarian (Lacto-Ovo) Sample 1-Day Menu  Breakfast 1 cup cooked oatmeal  1 slice whole wheat toast  1 tablespoon peanut butter without salt  1 banana  1 cup 1% milk  Lunch Tacos made with: 2 corn tortillas   cup black beans, low sodium   cup roasted or grilled chicken (without skin)   avocado  Squeeze of lime juice  1 cup salad greens  1 tablespoon low-sodium salad dressing   cup strawberries  1 orange  Evening Meal Stir fry made with:  cup tofu  1 cup brown rice   cup broccoli   cup green beans   cup  peppers   tablespoon peanut oil  1 orange  1 cup 1% milk  Evening Snack 4 strips celery  2 tablespoons hummus  1 hard-boiled egg   Low-Sodium Vegan Sample 1-Day Menu  Breakfast 1 cup cooked oatmeal  1 tablespoon peanut butter without salt  1 cup blueberries  1 cup soymilk fortified with calcium, vitamin B12, and vitamin D  Lunch 1 small whole wheat pita   cup cooked lentils  2 tablespoons hummus  4 carrot sticks  1 medium apple  1 cup soymilk fortified with calcium, vitamin B12, and vitamin D  Evening Meal Stir fry made with:  cup tofu  1 cup brown rice   cup broccoli   cup green beans   cup peppers   tablespoon peanut oil  1 cup cantaloupe  Evening Snack 1 cup soy yogurt   cup mixed nuts  Copyright 2020  Academy of [employer] and Dietetics. All rights reserved  Sodium Free Flavoring Tips  When cooking, the following items may be used for flavoring instead of salt or seasonings that contain sodium. Remember: A little bit of spice goes a long way! Be careful not to overseason. Spice Blend Recipe (makes about ? cup) 5 teaspoons onion powder  2 teaspoons garlic powder  2 teaspoons paprika  2 teaspoon dry mustard  1 teaspoon crushed thyme leaves   teaspoon white pepper   teaspoon celery seed Food Item Flavorings  Beef Basil, bay leaf, caraway, curry, dill, dry mustard, garlic, grape jelly, green pepper, mace, marjoram, mushrooms (fresh), nutmeg, onion or onion powder, parsley, pepper, rosemary, sage  Chicken Basil, cloves, cranberries, mace, mushrooms (fresh), nutmeg, oregano, paprika, parsley, pineapple, saffron, sage, savory, tarragon, thyme, tomato, turmeric  Egg Chervil, curry, dill, dry mustard, garlic or garlic powder, green pepper, jelly, mushrooms (fresh), nutmeg, onion powder, paprika, parsley, rosemary, tarragon, tomato  Fish Basil, bay leaf, chervil, curry, dill, dry mustard, green pepper, lemon juice, marjoram, mushrooms (fresh), paprika, pepper,  tarragon, tomato, turmeric  Lamb Cloves, curry, dill, garlic or garlic powder, mace, mint, mint jelly, onion, oregano, parsley, pineapple, rosemary, tarragon, thyme  Pork Applesauce, basil, caraway, chives, cloves, garlic or garlic powder, onion or onion powder, rosemary, thyme  Veal Apricots, basil, bay leaf, currant jelly, curry, ginger, marjoram, mushrooms (fresh), oregano, paprika  Vegetables Basil, dill, garlic or garlic powder, ginger, lemon juice, mace, marjoram, nutmeg, onion or onion powder, tarragon, tomato, sugar or sugar substitute, salt-free salad dressing, vinegar  Desserts Allspice, anise, cinnamon, cloves, ginger, mace, nutmeg, vanilla extract, other  extracts   Copyright 2020  Academy of [employer] and Dietetics. All rights reserved  Fluid Restricted Nutrition Therapy  You have been prescribed this diet because your condition affects how much fluid you can eat or drink. If your heart, liver, or kidneys aren't working properly, you may not be able to effectively eliminate fluids from the body and this may cause swelling (edema) in the legs, arms, and/or stomach. Drink no more than _________ liters or ________ ounces or ________cups of fluid per day.  You don't need to stop eating or drinking the same fluids you normally would, but you may need to eat or drink less than usual.  Your registered dietitian nutritionist will help you determine the correct amount of fluid to consume during the day Breakfast Include fluids taken with medications  Lunch Include fluids taken with medications  Dinner Include fluids taken with medications  Bedtime Snack Include fluids taken with medications     Tips What Are Fluids?  A fluid is anything that is liquid or anything that would melt if left at room temperature. You will need to count these foods and liquids--including any liquid used to take medication--as part of your daily fluid intake. Some examples are: Alcohol (drink only with your  doctor's permission)  Coffee, tea, and other hot beverages  Gelatin (Jell-O)  Gravy  Ice cream, sherbet, sorbet  Ice cubes, ice chips  Milk, liquid creamer  Nutritional supplements  Popsicles  Vegetable and fruit juices; fluid in canned fruit  Watermelon  Yogurt  Soft drinks, lemonade, limeade  Soups  Syrup How Do I Measure My Fluid Intake? Record your fluid intake daily.  Tip: Every day, each time you eat or drink fluids, pour water in the same amount into an empty container that can hold the same amount of fluids you are allowed daily. This may help you keep track of how much fluid you are taking in throughout the day.  To accurately keep track of how much liquid you take in, measure the size of the cups, glasses, and bowls you use. If you eat soup, measure how much of it is liquid and how much is solid (such as noodles, vegetables, meat). Conversions for Measuring Fluid Intake  Milliliters (mL) Liters (L) Ounces (oz) Cups (c)  1000 1 32 4  1200 1.2 40 5  1500 1.5 50 6 1/4  1800 1.8 60 7 1/2  2000 2 67 8 1/3  Tips to Reduce Your Thirst Chew gum or suck on hard candy.  Rinse or gargle with mouthwash. Do not swallow.  Ice chips or popsicles my help quench thirst, but this too needs to be calculated into the total restriction. Melt ice chips or cubes first to figure out how much fluid they produce (for example, experiment with melting  cup ice chips or 2 ice cubes).  Add a lemon wedge to your water.  Limit how much salt you take in. A high salt intake might make you thirstier.  Don't eat or drink all your allowed liquids at once. Space your liquids out through the day.  Use small glasses and cups and sip slowly. If allowed, take your medications with fluids you eat or drink during a meal.   Fluid-Restricted Nutrition Therapy Sample 1-Day Menu  Breakfast 1 slice wheat toast  1 tablespoon peanut butter  1/2 cup yogurt (120 milliliters)  1/2 cup blueberries  1 cup milk (240  milliliters)   Lunch 3 ounces sliced [place]  2 slices whole wheat  bread  1/2 cup lettuce for sandwich  2 slices tomato for sandwich  1 ounce reduced-fat, reduced-sodium cheese  1/2 cup fresh carrot sticks  1 banana  1 cup unsweetened tea (240 milliliters)   Evening Meal 8 ounces soup (240 milliliters)  3 ounces salmon  1/2 cup quinoa  1 cup green beans  1 cup mixed greens salad  1 tablespoon olive oil  1 cup coffee (240 milliliters)  Evening Snack 1/2 cup sliced peaches  1/2 cup frozen yogurt (120 milliliters)  1 cup water (240 milliliters)  Copyright 2020  Academy of Nutrition and Dietetics. All rights reserved   Start taking your eliquis from Monday 08/09/2022

## 2022-08-03 NOTE — Consult Note (Addendum)
   Heart Failure Nurse Navigator Note  HFpEF 50 to 55%.  Had previously been reported at 30 to 35%.  Echocardiogram performed on this admission results are pending.  She presented to the emergency room with complaints of progressively worsening shortness of breath, weight increase and lower extremity edema.  She also noted black stools.BNP was 740.  Chest x-ray showed pulmonary edema.  Comorbidities:  Paroxysmal atrial fibrillation on NOAC Hypertension Hyperlipidemia Obesity Pulmonary hypertension  Medications:  Carvedilol 6.25 mg twice a day Furosemide 40 mg IV every 12 hours Crestor 40 mg daily  Eliquis is on hold.  At home she was also on Entresto 97/103 mg twice a day Potassium chloride 20 mEq daily.   Labs:  Sodium 142, potassium 4.8, chloride 116, CO2 21, BUN 34, creatinine 1.49 down from 1.62 of yesterday, estimated GFR 35 hemoglobin 9.7, hematocrit 28.9 Weight is 130.5 kg Blood pressure 130/55  Met with patient in the emergency room, she is lying on a gurney in no acute distress.  Daughter is also present.  Discussed what heart failure means.  States that this is a term she has heard in the past.  She admitted to not weighing herself daily and recording.  Went over the importance of daily weights and what to report.  Daughter also states that her mother has been eating fast food lately.  Discussed the importance of low sodium intake and limiting to 2000 mg a day.  Some examples of sodium is contained in like [employer] whopper etc. discussed reading labels.  Also discussed the importance of sticking with no more than 64 ounces daily and what constitutes a liquid.  Both voiced understanding.  Also made aware of the [facility] program.  She was given a flyer.  Told her that I would let them discuss it and I would check with her tomorrow.  Given the living with heart failure teaching booklet, zone magnet, info on low-sodium and heart failure along with weight  chart.  They had no further questions at this time.  She was made aware of her appointment in the outpatient heart failure clinic on November 30 at [place]

## 2022-08-04 ENCOUNTER — Encounter
Admission: EM | Disposition: A | Discharge status: Home / Self Care | Payer: Self-pay | Source: Home / Self Care | Attending: Emergency Medicine

## 2022-08-04 ENCOUNTER — Encounter: Payer: Self-pay | Admitting: Internal Medicine

## 2022-08-04 ENCOUNTER — Observation Stay: Payer: Medicare Other | Admitting: Anesthesiology

## 2022-08-04 DIAGNOSIS — N1831 Chronic kidney disease, stage 3a: Secondary | ICD-10-CM | POA: Diagnosis not present

## 2022-08-04 DIAGNOSIS — I5033 Acute on chronic diastolic (congestive) heart failure: Secondary | ICD-10-CM | POA: Diagnosis not present

## 2022-08-04 DIAGNOSIS — I130 Hypertensive heart and chronic kidney disease with heart failure and stage 1 through stage 4 chronic kidney disease, or unspecified chronic kidney disease: Secondary | ICD-10-CM | POA: Diagnosis not present

## 2022-08-04 DIAGNOSIS — K279 Peptic ulcer, site unspecified, unspecified as acute or chronic, without hemorrhage or perforation: Secondary | ICD-10-CM | POA: Diagnosis not present

## 2022-08-04 DIAGNOSIS — K259 Gastric ulcer, unspecified as acute or chronic, without hemorrhage or perforation: Secondary | ICD-10-CM | POA: Diagnosis not present

## 2022-08-04 DIAGNOSIS — K269 Duodenal ulcer, unspecified as acute or chronic, without hemorrhage or perforation: Secondary | ICD-10-CM | POA: Diagnosis not present

## 2022-08-04 DIAGNOSIS — K3189 Other diseases of stomach and duodenum: Secondary | ICD-10-CM | POA: Diagnosis not present

## 2022-08-04 DIAGNOSIS — K2970 Gastritis, unspecified, without bleeding: Secondary | ICD-10-CM | POA: Diagnosis not present

## 2022-08-04 DIAGNOSIS — I5022 Chronic systolic (congestive) heart failure: Secondary | ICD-10-CM | POA: Diagnosis not present

## 2022-08-04 DIAGNOSIS — Z87891 Personal history of nicotine dependence: Secondary | ICD-10-CM | POA: Diagnosis not present

## 2022-08-04 HISTORY — PX: ESOPHAGOGASTRODUODENOSCOPY (EGD) WITH PROPOFOL: SHX5813

## 2022-08-04 LAB — CBC WITH DIFFERENTIAL/PLATELET
Abs Immature Granulocytes: 0.01 10*3/uL (ref 0.00–0.07)
Basophils Absolute: 0 10*3/uL (ref 0.0–0.1)
Basophils Relative: 1 %
Eosinophils Absolute: 0.2 10*3/uL (ref 0.0–0.5)
Eosinophils Relative: 4 %
HCT: 27.8 % — ABNORMAL LOW (ref 36.0–46.0)
Hemoglobin: 9.2 g/dL — ABNORMAL LOW (ref 12.0–15.0)
Immature Granulocytes: 0 %
Lymphocytes Relative: 37 %
Lymphs Abs: 1.7 10*3/uL (ref 0.7–4.0)
MCH: 30.3 pg (ref 26.0–34.0)
MCHC: 33.1 g/dL (ref 30.0–36.0)
MCV: 91.4 fL (ref 80.0–100.0)
Monocytes Absolute: 0.4 10*3/uL (ref 0.1–1.0)
Monocytes Relative: 10 %
Neutro Abs: 2.2 10*3/uL (ref 1.7–7.7)
Neutrophils Relative %: 48 %
Platelets: 237 10*3/uL (ref 150–400)
RBC: 3.04 MIL/uL — ABNORMAL LOW (ref 3.87–5.11)
RDW: 13.4 % (ref 11.5–15.5)
WBC: 4.4 10*3/uL (ref 4.0–10.5)
nRBC: 0 % (ref 0.0–0.2)

## 2022-08-04 LAB — BASIC METABOLIC PANEL
Anion gap: 7 (ref 5–15)
BUN: 30 mg/dL — ABNORMAL HIGH (ref 8–23)
CO2: 24 mmol/L (ref 22–32)
Calcium: 9 mg/dL (ref 8.9–10.3)
Chloride: 110 mmol/L (ref 98–111)
Creatinine, Ser: 1.41 mg/dL — ABNORMAL HIGH (ref 0.44–1.00)
GFR, Estimated: 37 mL/min — ABNORMAL LOW (ref >60)
Glucose, Bld: 89 mg/dL (ref 70–99)
Potassium: 4.1 mmol/L (ref 3.5–5.1)
Sodium: 141 mmol/L (ref 135–145)

## 2022-08-04 LAB — MAGNESIUM: Magnesium: 2.1 mg/dL (ref 1.7–2.4)

## 2022-08-04 SURGERY — ESOPHAGOGASTRODUODENOSCOPY (EGD) WITH PROPOFOL
Anesthesia: Regional

## 2022-08-04 MED ORDER — INFLUENZA VAC A&B SA ADJ QUAD 0.5 ML IM PRSY
0.5000 mL | PREFILLED_SYRINGE | Freq: Once | INTRAMUSCULAR | Status: AC
  Administered 2022-08-04: 0.5 mL via INTRAMUSCULAR
  Filled 2022-08-04: qty 0.5

## 2022-08-04 MED ORDER — PROPOFOL 500 MG/50ML IV EMUL
INTRAVENOUS | Status: DC | PRN
  Administered 2022-08-04: 100 ug/kg/min via INTRAVENOUS

## 2022-08-04 MED ORDER — LIDOCAINE HCL (PF) 2 % IJ SOLN
INTRAMUSCULAR | Status: AC
  Filled 2022-08-04: qty 5

## 2022-08-04 MED ORDER — PROPOFOL 10 MG/ML IV BOLUS
INTRAVENOUS | Status: AC
  Filled 2022-08-04: qty 20

## 2022-08-04 MED ORDER — PROPOFOL 10 MG/ML IV BOLUS
INTRAVENOUS | Status: DC | PRN
  Administered 2022-08-04: 60 mg via INTRAVENOUS

## 2022-08-04 MED ORDER — INFLUENZA VAC A&B SA ADJ QUAD 0.5 ML IM PRSY: 0.5000 mL | PREFILLED_SYRINGE | INTRAMUSCULAR | Status: DC

## 2022-08-04 MED ORDER — LIDOCAINE HCL (CARDIAC) PF 100 MG/5ML IV SOSY
PREFILLED_SYRINGE | INTRAVENOUS | Status: DC | PRN
  Administered 2022-08-04: 50 mg via INTRAVENOUS

## 2022-08-04 MED ORDER — PANTOPRAZOLE SODIUM 40 MG PO TBEC: 40.0000 mg | DELAYED_RELEASE_TABLET | Freq: Two times a day (BID) | ORAL | 2 refills | Status: DC

## 2022-08-04 MED ORDER — SODIUM CHLORIDE 0.9 % IV SOLN: INTRAVENOUS | Status: DC

## 2022-08-04 NOTE — Progress Notes (Signed)
.  vest  ReDs clip-19% Ruler-47 Station-D  I personally weighed the patient, she was 112 kg.

## 2022-08-04 NOTE — Discharge Summary (Signed)
Physician Discharge Summary   Patient: Kimberly Henry MRN: [id number] DOB: 03-05-1941  Admit date:     08/02/2022  Discharge date: 08/04/22  Discharge Physician: Fritzi Mandes   PCP: Steele Sizer, MD   Recommendations at discharge:   follow-up PCP in 1 to 2 week follow-up Dr. Fletcher Anon cardiology on your December appointment follow-up Dr. Haig Prophet in 2 to 3 weeks for peptic ulcer disease  Discharge Diagnoses: Principal Problem:   Acute on chronic diastolic CHF (congestive heart failure) ([place]) Active Problems:   AKI (acute kidney injury) [facility])   Hyperkalemia   Melena   Hospital Course: Char Feltman Stoltzfus is a 81 y.o. adult with medical history significant of chronic systolic CHF, PAF on Eliquis, HTN, HLD, obesity, PAH, who presented to the hospital with shortness of breath, leg swelling, weight gain and diarrhea.  Symptoms have been going on for about 3 days and have progressively worsened.  She noticed that his stools were dark the day prior to admission.   Acute on chronic diastolic CHF:  -- patient received IV Lasix.  Diuresis well. Patient is on room air. Euvolemic. Reds vest 19%   She has history of chronic systolic CHF with prior EF of 30 to 35% in December 2018, which improved to 50 to 55% on 2D echo in May 2021.  2D echo EF 45-50% with PHT. -- Patient's cardiac mental resume should follow-up with Dr. Fletcher Anon on her appointment in December   AKI complicated by hyperkalemia: Hyperkalemia has resolved and creatinine is improving.  Monitor BMP.  Baseline creatinine around 0.95 -- cardiac meds resume.   Melena, acute blood loss anemia due to peptic ulcer disease in the setting of blood thinners -- Eliquis on hold.  H&H is stable.  -- Received IV Protonix.  --Hemoglobin was 12.1 in September 2023 and down to 9.7 on admission. -- Patient was seen by G.I. Dr. Haig Prophet. EGD was performed.- Normal esophagus.                        - Non-bleeding gastric ulcers with a clean ulcer base                          (Forrest Class III). Biopsied.                        - Non-bleeding duodenal ulcers with a clean ulcer base                         (Forrest Class III). Recommendation:        - Return patient to hospital ward for ongoing care.                        - Advance diet as tolerated.                        - Continue present medications.                        - Await pathology results.                        - Resume Eliquis (apixaban) at prior dose in 3 days.                        -  Use a proton pump inhibitor PO BID for 8 weeks.   Other comorbidities include paroxysmal atrial fibrillation, pulmonary artery hypertension, hyperlipidemia, hypertension  Above was discussed with patient. Overall hemodynamically stable. Patient requesting go home. Okay from G.I. standpoint for discharge. Clinically patient is improving.        Consultants: G.I. Dr. Haig Prophet Procedures performed: EGD Disposition: Home Diet recommendation:  Discharge Diet Orders (From admission, onward)     Start     Ordered   08/04/22 0000  Diet - low sodium heart healthy        08/04/22 1343           Cardiac diet DISCHARGE MEDICATION: Allergies as of 08/04/2022   No Known Allergies      Medication List     TAKE these medications    bisacodyl 10 MG suppository Commonly known as: DULCOLAX Place 1 suppository (10 mg total) rectally daily as needed for moderate constipation.   carvedilol 6.25 MG tablet Commonly known as: COREG TAKE 1 TABLET BY MOUTH TWICE  DAILY WITH MEALS   Eliquis 5 MG Tabs tablet Generic drug: apixaban TAKE 1 TABLET BY MOUTH  TWICE DAILY Notes to patient: Start taking it from Monday 08/09/22   Entresto 97-103 MG Generic drug: sacubitril-valsartan TAKE 1 TABLET BY MOUTH TWICE  DAILY   HYDROcodone-acetaminophen 5-325 MG tablet Commonly known as: NORCO/VICODIN Take 1 tablet by mouth every 4 (four) hours as needed for moderate pain (pain score 4-6).    levocetirizine 5 MG tablet Commonly known as: XYZAL TAKE 1 TABLET BY MOUTH IN THE  EVENING   multivitamin with minerals Tabs tablet Take 1 tablet by mouth daily.   pantoprazole 40 MG tablet Commonly known as: Protonix Take 1 tablet (40 mg total) by mouth 2 (two) times daily before a meal.   potassium chloride SA 20 MEQ tablet Commonly known as: KLOR-CON M TAKE 1 TABLET BY MOUTH DAILY   rosuvastatin 40 MG tablet Commonly known as: CRESTOR TAKE 1 TABLET BY MOUTH DAILY   spironolactone 25 MG tablet Commonly known as: ALDACTONE TAKE 1 TABLET BY MOUTH DAILY        Follow-up Information     Steele Sizer, MD. Go in 1 week(s).   Specialty: Family Medicine Why: Appointment on Wednesday, 08/11/2022 at 2:00pm. Contact information: [address] [place] [address] [phone]         Wellington Hampshire, MD. Go to.   Specialty: Cardiology Why: on your appt in December Contact information: [address] [place] [address] [phone]         Lesly Rubenstein, MD. Schedule an appointment as soon as possible for a visit in 2 week(s).   Specialty: Gastroenterology Why: Please call and schedule this hospital follow-up appointment. Contact information: [place] [address] [phone]                 Condition at discharge: fair  The results of significant diagnostics from this hospitalization (including imaging, microbiology, ancillary and laboratory) are listed below for reference.   Imaging Studies: ECHOCARDIOGRAM COMPLETE  Result Date: 08/03/2022    ECHOCARDIOGRAM REPORT   Patient Name:   Kimberly Henry Date of Exam: 08/03/2022 Medical Rec #:  [id number]      Height:       63.0 in Accession #:    [id number]     Weight:       287.7 lb Date of Birth:  [birth year]  BSA:          2.257 m Patient Age:    81 years       BP:           130/55 mmHg Patient Gender: F              HR:           70  bpm. Exam Location:  [facility] Procedure: 2D Echo, Color Doppler and Cardiac Doppler Indications:     I50.21 congestive heart failure-Acute Systolic  History:         Patient has prior history of Echocardiogram examinations, most                  recent 01/22/2020. HFrEF, NICM, Non-obstructive CAD, COPD; Risk                  Factors:Hypertension and Dyslipidemia.  Sonographer:     Charmayne Sheer Referring Phys:  [place] Diagnosing Phys: Nelva Bush MD  Sonographer Comments: Suboptimal parasternal window and suboptimal subcostal window. IMPRESSIONS  1. Left ventricular ejection fraction, by estimation, is 45 to 50%. The left ventricle has mildly decreased function. The left ventricle demonstrates global hypokinesis. There is mild left ventricular hypertrophy. Left ventricular diastolic parameters are indeterminate. The average left ventricular global longitudinal strain is -9.8 %. The global longitudinal strain is abnormal.  2. Right ventricular systolic function is normal. The right ventricular size is mildly enlarged. There is mildly elevated pulmonary artery systolic pressure.  3. Left atrial size was severely dilated.  4. The mitral valve is degenerative. Mild mitral valve regurgitation. No evidence of mitral stenosis.  5. Tricuspid valve regurgitation is mild to moderate.  6. The aortic valve has an indeterminant number of cusps. There is mild calcification of the aortic valve. There is mild thickening of the aortic valve. Aortic valve regurgitation is not visualized. Aortic valve sclerosis/calcification is present, without any evidence of aortic stenosis.  7. The inferior vena cava is dilated in size with <50% respiratory variability, suggesting right atrial pressure of 15 mmHg. FINDINGS  Left Ventricle: Left ventricular ejection fraction, by estimation, is 45 to 50%. The left ventricle has mildly decreased function. The left ventricle demonstrates global hypokinesis. The average left ventricular  global longitudinal strain is -9.8 %. The  global longitudinal strain is abnormal. The left ventricular internal cavity size was normal in size. There is mild left ventricular hypertrophy. Abnormal (paradoxical) septal motion, consistent with left bundle branch block. Left ventricular diastolic parameters are indeterminate. Right Ventricle: The right ventricular size is mildly enlarged. No increase in right ventricular wall thickness. Right ventricular systolic function is normal. There is mildly elevated pulmonary artery systolic pressure. The tricuspid regurgitant velocity is 2.69 m/s, and with an assumed right atrial pressure of 15 mmHg, the estimated right ventricular systolic pressure is 12.8 mmHg. Left Atrium: Left atrial size was severely dilated. Right Atrium: Right atrial size was not well visualized. Pericardium: There is no evidence of pericardial effusion. Mitral Valve: The mitral valve is degenerative in appearance. There is mild thickening of the mitral valve leaflet(s). Mild mitral annular calcification. Mild mitral valve regurgitation. No evidence of mitral valve stenosis. Tricuspid Valve: The tricuspid valve is normal in structure. Tricuspid valve regurgitation is mild to moderate. Aortic Valve: The aortic valve has an indeterminant number of cusps. There is mild calcification of the aortic valve. There is mild thickening of the aortic valve. Aortic valve regurgitation is not visualized. Aortic valve sclerosis/calcification  is present, without any evidence of aortic stenosis. Aortic valve mean gradient measures 3.0 mmHg. Aortic valve peak gradient measures 5.8 mmHg. Pulmonic Valve: The pulmonic valve was not well visualized. Pulmonic valve regurgitation is not visualized. No evidence of pulmonic stenosis. Aorta: The aortic root is normal in size and structure. Pulmonary Artery: The pulmonary artery is of normal size. Venous: The inferior vena cava is dilated in size with less than 50% respiratory  variability, suggesting right atrial pressure of 15 mmHg. IAS/Shunts: The interatrial septum was not well visualized.  LEFT VENTRICLE PLAX 2D LVIDd:         3.60 cm      Diastology LVIDs:         3.20 cm      LV e' medial:    5.98 cm/s LV PW:         1.20 cm      LV E/e' medial:  18.2 LV IVS:        1.20 cm      LV e' lateral:   13.90 cm/s                             LV E/e' lateral: 7.8  LV Volumes (MOD)            2D Longitudinal Strain LV vol d, MOD A2C: 131.0 ml 2D Strain GLS Avg:     -9.8 % LV vol d, MOD A4C: 130.0 ml LV vol s, MOD A2C: 70.1 ml LV vol s, MOD A4C: 66.4 ml LV SV MOD A2C:     60.9 ml LV SV MOD A4C:     130.0 ml LV SV MOD BP:      63.5 ml LEFT ATRIUM              Index LA diam:        4.60 cm  2.04 cm/m LA Vol (A2C):   107.0 ml 47.41 ml/m LA Vol (A4C):   148.0 ml 65.58 ml/m LA Biplane Vol: 125.0 ml 55.39 ml/m  AORTIC VALVE                   PULMONIC VALVE AV Vmax:           120.00 cm/s PV Vmax:       1.34 m/s AV Vmean:          85.900 cm/s PV Vmean:      79.900 cm/s AV VTI:            0.251 m     PV VTI:        0.273 m AV Peak Grad:      5.8 mmHg    PV Peak grad:  7.2 mmHg AV Mean Grad:      3.0 mmHg    PV Mean grad:  3.0 mmHg LVOT Vmax:         82.20 cm/s LVOT Vmean:        56.100 cm/s LVOT VTI:          0.158 m LVOT/AV VTI ratio: 0.63  AORTA Ao Root diam: 3.37 cm MITRAL VALVE                TRICUSPID VALVE MV Area (PHT): 4.68 cm     TR Peak grad:   28.9 mmHg MV Decel Time: 162 msec     TR Vmax:        269.00 cm/s MV E velocity: 109.00  cm/s MV A velocity: 54.00 cm/s   SHUNTS MV E/A ratio:  2.02         Systemic VTI: 0.16 m Nelva Bush MD Electronically signed by Nelva Bush MD Signature Date/Time: 08/03/2022/6:06:37 PM    Final    CT Angio Chest PE W and/or Wo Contrast  Result Date: 08/02/2022 CLINICAL DATA:  PE suspected, shortness of breath for 2 days, chest pain EXAM: CT ANGIOGRAPHY CHEST WITH CONTRAST TECHNIQUE: Multidetector CT imaging of the chest was performed using the  standard protocol during bolus administration of intravenous contrast. Multiplanar CT image reconstructions and MIPs were obtained to evaluate the vascular anatomy. RADIATION DOSE REDUCTION: This exam was performed according to the departmental dose-optimization program which includes automated exposure control, adjustment of the mA and/or kV according to patient size and/or use of iterative reconstruction technique. CONTRAST:  9m OMNIPAQUE IOHEXOL 350 MG/ML SOLN COMPARISON:  01/21/2020 FINDINGS: Cardiovascular: Satisfactory opacification of the pulmonary arteries to the segmental level. No evidence of pulmonary embolism. Gross cardiomegaly. Left and right coronary artery calcifications. Enlargement of the main pulmonary artery measuring up to 3.6 cm in caliber. No pericardial effusion. Aortic atherosclerosis. Mediastinum/Nodes: No enlarged mediastinal, hilar, or axillary lymph nodes. Thyroid gland, trachea, and esophagus demonstrate no significant findings. Lungs/Pleura: Diffuse bilateral bronchial wall thickening, interlobular septal thickening trace bilateral pleural effusions. Upper Abdomen: No acute abnormality. Musculoskeletal: No chest wall abnormality. No acute osseous findings. Disc degenerative disease of the thoracic spine with exaggerated thoracic kyphosis. Review of the MIP images confirms the above findings. IMPRESSION: 1. Negative examination for pulmonary embolism. 2. Diffuse bilateral bronchial wall thickening, interlobular septal thickening, and trace bilateral pleural effusions, consistent with pulmonary edema. 3. Gross cardiomegaly and coronary artery disease. 4. Enlargement of the main pulmonary artery, as can be seen in pulmonary hypertension. Aortic Atherosclerosis (ICD10-I70.0). Electronically Signed   By: ADelanna AhmadiM.D.   On: 08/02/2022 20:28   DG Chest Port 1 View  Result Date: 08/02/2022 CLINICAL DATA:  Shortness of breath EXAM: PORTABLE CHEST 1 VIEW COMPARISON:  Chest x-ray  dated June 08, 2022 FINDINGS: Cardiac and mediastinal contours are unchanged. Both lungs are clear. The visualized skeletal structures are unremarkable. IMPRESSION: No active disease. Electronically Signed   By: LYetta GlassmanM.D.   On: 08/02/2022 16:53    Microbiology: Results for orders placed or performed during the hospital encounter of 05/27/22  Surgical pcr screen     Status: None   Collection Time: 05/27/22 10:28 AM   Specimen: Nasal Mucosa; Nasal Swab  Result Value Ref Range Status   MRSA, PCR NEGATIVE NEGATIVE Final   Staphylococcus aureus NEGATIVE NEGATIVE Final    Comment: (NOTE) The Xpert SA Assay (FDA approved for NASAL specimens in patients 251years of age and older), is one component of a comprehensive surveillance program. It is not intended to diagnose infection nor to guide or monitor treatment. [facility] [place] [place] [address]    Labs: CBC: Recent Labs  Lab 08/02/22 1552 08/03/22 1052 08/04/22 0424  WBC 5.4  --  4.4  NEUTROABS  --   --  2.2  HGB 9.7* 9.7* 9.2*  HCT 29.5* 28.9* 27.8*  MCV 94.9  --  91.4  PLT 210  --  2160  Basic Metabolic Panel: Recent Labs  Lab 08/02/22 1552 08/03/22 0510 08/04/22 0424  NA 141 142 141  K 5.2* 4.8 4.1  CL 116* 116* 110  CO2 18* 21* 24  GLUCOSE  113* 92 89  BUN 33* 34* 30*  CREATININE 1.62* 1.49* 1.41*  CALCIUM 9.0 8.8* 9.0  MG  --   --  2.1   Liver Function Tests: Recent Labs  Lab 08/02/22 1552  AST 20  ALT 13  ALKPHOS 93  BILITOT 1.2  PROT 6.9  ALBUMIN 3.7   CBG: No results for input(s): "GLUCAP" in the last 168 hours.  Discharge time spent: greater than 30 minutes.  Signed: Fritzi Mandes, MD [facility] 08/04/2022

## 2022-08-04 NOTE — TOC Initial Note (Signed)
Transition of Care [facility]) - Initial/Assessment Note    Patient Details  Name: Kimberly Henry MRN: [id number] Date of Birth: 23-Dec-1940  Transition of Care [facility]) CM/SW Contact:    Tiburcio Bash, LCSW Phone Number: 08/04/2022, 11:14 AM  Clinical Narrative:                  CSW spoke with patient at bedside regarding outpatient PT recs, she reports she is already going to [facility] outpatient PT and will resume that. She reports no dme needs as she has walkers at home, reports having a ride home at dc.   No discharge needs identified.   Expected Discharge Plan: Home/Self Care Barriers to Discharge: Continued Medical Work up   Patient Goals and CMS Choice Patient states their goals for this hospitalization and ongoing recovery are:: to go home CMS Medicare.gov Compare Post Acute Care list provided to:: Patient Choice offered to / list presented to : Patient  Expected Discharge Plan and Services Expected Discharge Plan: Home/Self Care                                              Prior Living Arrangements/Services                       Activities of Daily Living Home Assistive Devices/Equipment: None ADL Screening (condition at time of admission) Patient's cognitive ability adequate to safely complete daily activities?: No Is the patient deaf or have difficulty hearing?: No Does the patient have difficulty seeing, even when wearing glasses/contacts?: No Does the patient have difficulty concentrating, remembering, or making decisions?: No Patient able to express need for assistance with ADLs?: Yes Does the patient have difficulty dressing or bathing?: No Independently performs ADLs?: Yes (appropriate for developmental age) Does the patient have difficulty walking or climbing stairs?: No Weakness of Legs: Both Weakness of Arms/Hands: None  Permission Sought/Granted                  Emotional Assessment              Admission diagnosis:   Acute on chronic systolic CHF (congestive heart failure) ([place]) [I50.23] Gastrointestinal hemorrhage, unspecified gastrointestinal hemorrhage type [K92.2] Congestive heart failure, unspecified HF chronicity, unspecified heart failure type ([place]) [I50.9] Patient Active Problem List   Diagnosis Date Noted   Hyperkalemia 08/03/2022   Melena 08/03/2022   Acute on chronic diastolic CHF (congestive heart failure) ([place]) 08/02/2022   S/P TKR (total knee replacement) using cement, left 06/08/2022   Status post knee replacement 06/08/2022   Stage 3a chronic kidney disease ([place]) 02/26/2022   Arthritis of left knee 06/09/2021   S/P TKR (total knee replacement) using cement, right 07/02/2020   Simple chronic bronchitis (HCC)    CAD (coronary artery disease)    Chronic a-fib (HCC)    Bradycardia    Lung nodule seen on imaging study 01/09/2020   NICM (nonischemic cardiomyopathy) ([place]) 01/09/2020   Morbid obesity with BMI of 40.0-44.9, adult ([place]) 07/10/2535   Chronic systolic heart failure ([place]) 04/28/2017   AKI (acute kidney injury) ([place]) 04/19/2017   Aortic atherosclerosis ([place]) 03/10/2017   Perennial allergic rhinitis with seasonal variation 05/30/2015   1st degree AV block 64/40/3474   Metabolic syndrome 25/95/6387   History of cervical cancer 03/03/2015   Hypertension, benign 03/03/2015   Osteoarthritis, knee 03/03/2015  Dyslipidemia 03/03/2015   PCP:  Steele Sizer, MD Pharmacy:   [facility] ([place], [place] [address] [place] [address] [place] [address] Phone: [phone] Fax: [phone]  [facility] [place], [place] - [address] [facility] [address] [place] [address] Phone: [phone] Fax: [place], [place] [facility] [address] [place] [address] Phone: [phone] Fax: [place] [employer], [place] [address] Phone: [phone] Fax: [phone]     Social Determinants of Health (SDOH) Interventions    Readmission Risk Interventions     No data to display

## 2022-08-04 NOTE — Op Note (Signed)
[facility] Gastroenterology Patient Name: Kimberly Henry Procedure Date: 08/04/2022 11:15 AM MRN: [id number] Account #: [id number] Date of Birth: 1940/11/27 Admit Type: Outpatient Age: 81 Room: [facility] ENDO ROOM 4 Gender: Female Note Status: Finalized Instrument Name: Upper Endoscope [id number] Procedure:             Upper GI endoscopy Indications:           Melena Providers:             Andrey Farmer MD, MD Medicines:             Monitored Anesthesia Care Complications:         No immediate complications. Estimated blood loss:                         Minimal. Procedure:             Pre-Anesthesia Assessment:                        - Prior to the procedure, a History and Physical was                         performed, and patient medications and allergies were                         reviewed. The patient is competent. The risks and                         benefits of the procedure and the sedation options and                         risks were discussed with the patient. All questions                         were answered and informed consent was obtained.                         Patient identification and proposed procedure were                         verified by the physician, the nurse, the                         anesthesiologist, the anesthetist and the technician                         in the endoscopy suite. Mental Status Examination:                         alert and oriented. Airway Examination: normal                         oropharyngeal airway and neck mobility. Respiratory                         Examination: clear to auscultation. CV Examination:                         normal. Prophylactic Antibiotics: The patient does not  require prophylactic antibiotics. Prior                         Anticoagulants: The patient has taken Eliquis                         (apixaban), last dose was 2 days prior to procedure.                          ASA Grade Assessment: III - A patient with severe                         systemic disease. After reviewing the risks and                         benefits, the patient was deemed in satisfactory                         condition to undergo the procedure. The anesthesia                         plan was to use monitored anesthesia care (MAC).                         Immediately prior to administration of medications,                         the patient was re-assessed for adequacy to receive                         sedatives. The heart rate, respiratory rate, oxygen                         saturations, blood pressure, adequacy of pulmonary                         ventilation, and response to care were monitored                         throughout the procedure. The physical status of the                         patient was re-assessed after the procedure.                        After obtaining informed consent, the endoscope was                         passed under direct vision. Throughout the procedure,                         the patient's blood pressure, pulse, and oxygen                         saturations were monitored continuously. The Endoscope                         was introduced through the mouth, and advanced to the  second part of duodenum. The upper GI endoscopy was                         accomplished without difficulty. The patient tolerated                         the procedure well. Findings:      The examined esophagus was normal.      Two non-bleeding superficial gastric ulcers with a clean ulcer base       (Forrest Class III) were found at the pylorus. The largest lesion was 1       mm in largest dimension. Biopsies were taken with a cold forceps for       Helicobacter pylori testing. Estimated blood loss was minimal.      Few non-bleeding superficial duodenal ulcers with a clean ulcer base       (Forrest Class III) were found in the  duodenal bulb. The largest lesion       was 5 mm in largest dimension. Impression:            - Normal esophagus.                        - Non-bleeding gastric ulcers with a clean ulcer base                         (Forrest Class III). Biopsied.                        - Non-bleeding duodenal ulcers with a clean ulcer base                         (Forrest Class III). Recommendation:        - Return patient to hospital ward for ongoing care.                        - Advance diet as tolerated.                        - Continue present medications.                        - Await pathology results.                        - Resume Eliquis (apixaban) at prior dose in 3 days.                        - Use a proton pump inhibitor PO BID for 8 weeks. Procedure Code(s):     --- Professional ---                        [phone], Esophagogastroduodenoscopy, flexible,                         transoral; with biopsy, single or multiple Diagnosis Code(s):     --- Professional ---                        K25.9, Gastric ulcer, unspecified as acute or chronic,  without hemorrhage or perforation                        K26.9, Duodenal ulcer, unspecified as acute or                         chronic, without hemorrhage or perforation                        K92.1, Melena (includes Hematochezia) CPT copyright 2022 [employer]. All rights reserved. The codes documented in this report are preliminary and upon coder review may  be revised to meet current compliance requirements. Andrey Farmer MD, MD 08/04/2022 12:29:53 PM Number of Addenda: 0 Note Initiated On: 08/04/2022 11:15 AM Estimated Blood Loss:  Estimated blood loss was minimal.      [facility]

## 2022-08-04 NOTE — Progress Notes (Addendum)
   Heart Failure Nurse Navigator Note  With patient today, she was sitting up in the chair at bedside.  Explained the red vest and the reason for the readings.  Is in agreement to proceed, she has not been weighed today, I personally waiter at 112 kg. Reds vest reading was 19%.  She feels that she is back to her baseline with her breathing, she is on room air.  I also discussed ventricle health program with she and her daughter Alyse Low when she was in the ED.  Given written info. She states they have not had any more time to talk about the program.  Told her that she could follow-up with me when she comes to her heart failure appointment with [facility].  She voices understanding.  Pricilla Riffle RN CHFN

## 2022-08-04 NOTE — Anesthesia Preprocedure Evaluation (Signed)
Anesthesia Evaluation  Patient identified by MRN, date of birth, ID band Patient awake    Reviewed: Allergy & Precautions, NPO status , Patient's Chart, lab work & pertinent test results  History of Anesthesia Complications Negative for: history of anesthetic complications  Airway Mallampati: II   Neck ROM: Full    Dental  (+) Upper Dentures, Lower Dentures   Pulmonary COPD, former smoker   Pulmonary exam normal breath sounds clear to auscultation       Cardiovascular hypertension, + CAD and +CHF (NICM)  + dysrhythmias (a fib on Eliquis, last dose 06/04/22)  Rhythm:Irregular Rate:Normal  08/03/22 TTE: IMPRESSIONS  1. Left ventricular ejection fraction, by estimation, is 45 to 50%. The  left ventricle has mildly decreased function. The left ventricle  demonstrates global hypokinesis. There is mild left ventricular  hypertrophy. Left ventricular diastolic parameters  are indeterminate. The average left ventricular global longitudinal strain  is -9.8 %. The global longitudinal strain is abnormal.   2. Right ventricular systolic function is normal. The right ventricular  size is mildly enlarged. There is mildly elevated pulmonary artery  systolic pressure.   3. Left atrial size was severely dilated.   4. The mitral valve is degenerative. Mild mitral valve regurgitation. No  evidence of mitral stenosis.   5. Tricuspid valve regurgitation is mild to moderate.   6. The aortic valve has an indeterminant number of cusps. There is mild  calcification of the aortic valve. There is mild thickening of the aortic  valve. Aortic valve regurgitation is not visualized. Aortic valve  sclerosis/calcification is present,  without any evidence of aortic stenosis.   7. The inferior vena cava is dilated in size with <50% respiratory  variability, suggesting right atrial pressure of 15 mmHg.    ECG 05/24/22: A fib 62bpm, LAD, LBBB, no changes from  prior   Neuro/Psych negative neurological ROS     GI/Hepatic negative GI ROS,,,  Endo/Other  Class 3 obesity  Renal/GU Renal disease (stage III CKD)     Musculoskeletal  (+) Arthritis ,    Abdominal   Peds  Hematology negative hematology ROS (+)   Anesthesia Other Findings   Reproductive/Obstetrics Cervical CA                             Anesthesia Physical Anesthesia Plan  ASA: 4  Anesthesia Plan: General and Regional   Post-op Pain Management: Regional block*   Induction: Intravenous  PONV Risk Score and Plan: 3 and Propofol infusion and TIVA  Airway Management Planned:   Additional Equipment:   Intra-op Plan:   Post-operative Plan:   Informed Consent: I have reviewed the patients History and Physical, chart, labs and discussed the procedure including the risks, benefits and alternatives for the proposed anesthesia with the patient or authorized representative who has indicated his/her understanding and acceptance.     Dental advisory given  Plan Discussed with: CRNA  Anesthesia Plan Comments: (Admitted on 08/02/22 with melena, increased fluid retention and SOB, found to be anemic and now posted for EGD.  IVGA, PIV x 1, ASA SM, RBO discussed, Consent signed.)        Anesthesia Quick Evaluation

## 2022-08-04 NOTE — Progress Notes (Signed)
Physical Therapy Treatment Patient Details Name: Kimberly Henry MRN: [id number] DOB: 09/09/1941 Today's Date: 08/04/2022   History of Present Illness 81 yo female presents with acute on chronic diastolic CHF. PMH includes CAD, COPD, and atrial fibrillation on Eliquis    PT Comments    Pt found seated in chair upon PT entry. Sit<>Stand Mod I with no VC. Pt performed standing weightbearing weight shifts with bilateral UE support with no LOB noted. Pt remained pleasant throughout session and eager for mobility. Pt would benefit from skilled physical therapy to address the listed deficits (see below) to increase independence with ADLs and function. Current recommendation is home with outpatient PT and intermittent assist to return pt to PLOF.       Recommendations for follow up therapy are one component of a multi-disciplinary discharge planning process, led by the attending physician.  Recommendations may be updated based on patient status, additional functional criteria and insurance authorization.  Follow Up Recommendations  Outpatient PT     Assistance Recommended at Discharge Intermittent Supervision/Assistance  Patient can return home with the following A little help with bathing/dressing/bathroom;Assistance with cooking/housework;Assist for transportation;Help with stairs or ramp for entrance   Equipment Recommendations  None recommended by PT    Recommendations for Other Services       Precautions / Restrictions Precautions Precautions: Fall Restrictions Weight Bearing Restrictions: No     Mobility  Bed Mobility                    Transfers Overall transfer level: Modified independent   Transfers: Sit to/from Stand Sit to Stand: Modified independent (Device/Increase time)                Ambulation/Gait                   Stairs             Wheelchair Mobility    Modified Rankin (Stroke Patients Only)       Balance Overall  balance assessment: Needs assistance Sitting-balance support: Feet supported Sitting balance-Leahy Scale: Good     Standing balance support: No upper extremity supported Standing balance-Leahy Scale: Good                              Cognition Arousal/Alertness: Awake/alert Behavior During Therapy: WFL for tasks assessed/performed Overall Cognitive Status: Within Functional Limits for tasks assessed                                          Exercises Other Exercises Other Exercises: Standing weight shifts with bilateral UE support    General Comments        Pertinent Vitals/Pain Pain Assessment Pain Score: 0-No pain    Home Living                          Prior Function            PT Goals (current goals can now be found in the care plan section) Acute Rehab PT Goals Patient Stated Goal: to return home PT Goal Formulation: With patient Time For Goal Achievement: 08/17/22 Potential to Achieve Goals: Good Progress towards PT goals: Progressing toward goals    Frequency    Min 2X/week      PT Plan Current  plan remains appropriate    Co-evaluation              AM-PAC PT "6 Clicks" Mobility   Outcome Measure  Help needed turning from your back to your side while in a flat bed without using bedrails?: None Help needed moving from lying on your back to sitting on the side of a flat bed without using bedrails?: None Help needed moving to and from a bed to a chair (including a wheelchair)?: None Help needed standing up from a chair using your arms (e.g., wheelchair or bedside chair)?: None Help needed to walk in hospital room?: A Little Help needed climbing 3-5 steps with a railing? : A Little 6 Click Score: 22    End of Session Equipment Utilized During Treatment: Gait belt Activity Tolerance: Patient tolerated treatment well Patient left: Other (comment) (pt left with transport tech) Nurse Communication:  Mobility status PT Visit Diagnosis: Other abnormalities of gait and mobility (R26.89);Difficulty in walking, not elsewhere classified (R26.2);Unsteadiness on feet (R26.81)     Time: 9935-7017 PT Time Calculation (min) (ACUTE ONLY): 8 min  Charges:                        Claiborne Billings O'Daniel, SPT  08/04/2022, 11:45 AM

## 2022-08-04 NOTE — Anesthesia Procedure Notes (Signed)
Procedure Name: MAC Date/Time: 08/04/2022 12:01 PM  Performed by: Tollie Eth, CRNAPre-anesthesia Checklist: Patient identified, Emergency Drugs available, Suction available and Patient being monitored Patient Re-evaluated:Patient Re-evaluated prior to induction Oxygen Delivery Method: Simple face mask Induction Type: IV induction Placement Confirmation: positive ETCO2

## 2022-08-04 NOTE — Anesthesia Postprocedure Evaluation (Signed)
Anesthesia Post Note  Patient: [place]  Procedure(s) Performed: ESOPHAGOGASTRODUODENOSCOPY (EGD) WITH PROPOFOL  Patient location during evaluation: PACU Anesthesia Type: General Level of consciousness: awake and alert Pain management: pain level controlled Vital Signs Assessment: post-procedure vital signs reviewed and stable Respiratory status: spontaneous breathing, nonlabored ventilation, respiratory function stable and patient connected to nasal cannula oxygen Cardiovascular status: stable and blood pressure returned to baseline Postop Assessment: no apparent nausea or vomiting Anesthetic complications: no   No notable events documented.   Last Vitals:  Vitals:   08/04/22 1140 08/04/22 1223  BP: (!) 123/55 129/72  Pulse: 67 87  Resp: 18 20  Temp: (!) 36.2 C 36.8 C  SpO2: 99% 98%    Last Pain:  Vitals:   08/04/22 1243  TempSrc:   PainSc: 0-No pain                 Milinda Pointer

## 2022-08-04 NOTE — Transfer of Care (Signed)
Immediate Anesthesia Transfer of Care Note  Patient: Kimberly Henry  Procedure(s) Performed: ESOPHAGOGASTRODUODENOSCOPY (EGD) WITH PROPOFOL  Patient Location: Endoscopy Unit  Anesthesia Type:General  Level of Consciousness: awake, alert , and oriented  Airway & Oxygen Therapy: Patient Spontanous Breathing  Post-op Assessment: Report given to RN and Post -op Vital signs reviewed and stable  Post vital signs: Reviewed and stable  Last Vitals:  Vitals Value Taken Time  BP 129/72 08/04/22 1228  Temp 36.8 C 08/04/22 1223  Pulse 71 08/04/22 1228  Resp 26 08/04/22 1228  SpO2 98 % 08/04/22 1228  Vitals shown include unvalidated device data.  Last Pain:  Vitals:   08/04/22 1223  TempSrc: Temporal  PainSc: Asleep         Complications: No notable events documented.

## 2022-08-06 LAB — SURGICAL PATHOLOGY

## 2022-08-11 ENCOUNTER — Inpatient Hospital Stay: Payer: Medicare Other | Admitting: Family Medicine

## 2022-08-12 ENCOUNTER — Encounter: Payer: Self-pay | Admitting: Family

## 2022-08-12 ENCOUNTER — Inpatient Hospital Stay: Payer: Medicare Other | Admitting: Internal Medicine

## 2022-08-12 ENCOUNTER — Ambulatory Visit: Payer: Medicare Other | Attending: Family | Admitting: Family

## 2022-08-12 VITALS — BP 156/80 | HR 72 | Resp 18 | Wt 277.0 lb

## 2022-08-12 DIAGNOSIS — Z87891 Personal history of nicotine dependence: Secondary | ICD-10-CM | POA: Diagnosis not present

## 2022-08-12 DIAGNOSIS — I110 Hypertensive heart disease with heart failure: Secondary | ICD-10-CM | POA: Diagnosis not present

## 2022-08-12 DIAGNOSIS — D649 Anemia, unspecified: Secondary | ICD-10-CM | POA: Diagnosis not present

## 2022-08-12 DIAGNOSIS — Z79899 Other long term (current) drug therapy: Secondary | ICD-10-CM | POA: Insufficient documentation

## 2022-08-12 DIAGNOSIS — I5022 Chronic systolic (congestive) heart failure: Secondary | ICD-10-CM

## 2022-08-12 DIAGNOSIS — I10 Essential (primary) hypertension: Secondary | ICD-10-CM

## 2022-08-12 DIAGNOSIS — Z7901 Long term (current) use of anticoagulants: Secondary | ICD-10-CM | POA: Diagnosis not present

## 2022-08-12 MED ORDER — DAPAGLIFLOZIN PROPANEDIOL 10 MG PO TABS: 10.0000 mg | ORAL_TABLET | Freq: Every day | ORAL | 5 refills | Status: DC

## 2022-08-12 NOTE — Patient Instructions (Signed)
Continue weighing daily and call for an overnight weight gain of 3 pounds or more or a weekly weight gain of more than 5 pounds.   If you have voicemail, please make sure your mailbox is cleaned out so that we may leave a message and please make sure to listen to any voicemails.     

## 2022-08-12 NOTE — Progress Notes (Signed)
Henry ID: Kimberly Henry, adult    DOB: 1941/03/30, 81 y.o.   MRN: [id number]  HPI  Kimberly Henry is a 81 y/o female with a history of AV block, COPD, hyperlipidemia, HTN, osteoarthritis, remote tobacco use and chronic heart failure.  Echo report from 08/03/22 showed an EF of 45-50% along with mildly elevated PA pressure, severe LAE, mild MR and mild/moderate TR. Echo report from 01/22/20 reviewed and showed an EF of 50-55% along with mildly elevated PA pressure. Echo report from 08/26/17 reviewed and showed an EF of 30-35% along with moderate MR and normal PA pressure. Echo from 04/19/17 reviewed and shows an EF of 30-35% along with mild MR and moderate TR.   Right/Left Cardiac catheterization was done 04/21/17 and showed mild nonobstructive CAD with an EF of 25-35%. Moderately elevated filling pressure, severe pulmonary HTN and severely reduced cardiac output at 3.14 with a cardiac index of 1.38. PA pressure was 61/34 with a mean of 47 mm Hg.   Admitted 08/02/22 due to shortness of breath, leg swelling, weight gain and diarrhea. Also noticed dark stools. Initially given IV lasix with transition or oral diuretics. GI consult obtained. ReDs clip reading 19%. Hyperkalemia improved. Eliquis held and IV protonix given. EGD performed. Eliquis resumed. Discharged after 2 days.   Kimberly Henry presents today for a follow up visit although hasn't been seen since March 2022. Kimberly Henry presents with a chief complaint of moderate fatigue with minimal exertion. Describes this as chronic in nature having been present for several years. Denies any difficulty sleeping, abdominal distention, palpitations, pedal edema, chest pain, shortness of breath, dizziness, cough or weight gain.   Past Medical History:  Diagnosis Date   Acid phosphatase elevated    Aortic atherosclerosis (HCC)    Atrial fibrillation (HCC)    a.) CHA2DS2VASc = 6 (age x 2, sex, HFrEF, HTN, vascular disease history);  b.) rate/rhythm maintained on oral carvedilol;  chronically anticoagulated with apixaban   AV block, 1st degree    Cervical cancer ([place]) 1999   COPD (chronic obstructive pulmonary disease) ([place])    HFrEF (heart failure with reduced ejection fraction) ([place])    a.) 04/2017 Echo: EF 30-35%, antsept HK, mild MR, mod dil LA, mildly dil RA, mod TR, mildly to mod elev PASP; b.) 08/2017 Echo: EF 30-35%, diff HK, antsept HK. Gr1 DD. Mild to mod MR. PASP nl; c.) TTE 01/22/2020: EF 50-55%, mild LAE, triv MR; RVSP 38.3   Hx of cervical malignancy    Hyperlipidemia    Hypertension    LBBB (left bundle branch block)    Long term current use of anticoagulant    a.) apixaban   Metabolic syndrome    NICM (nonischemic cardiomyopathy) ([place])    a.) 04/2017 Echo: EF 30-35%; b.) 04/2017 Cath: nonobs dzs; c.) 08/2017 Echo: EF 30-35%, diff HK, antsept HK. Gr1 DD. Mild to mod MR. PASP nl; d.) TTE 01/22/2020: EF 50-55%, mild LAE, triv MR, RVSP 38.3   Non-obstructive CAD (coronary artery disease)    a. 04/2017 Cath: mild nonobs CAD. EF 25-35%. CO 3.14, CI 1.38. Sev PAH [61/34(47].   Obesity, Class III, BMI 40-49.9 (morbid obesity) (HCC)    Osteoarthritis of both knees    PAH (pulmonary arterial hypertension) with portal hypertension ([place])    a.) 04/2017 Right Heart Cath: Sev PAH 61/34(47); b.) 08/2017 Echo: nl PASP; c.) TTE 01/22/2020: RVSP 38.3   Past Surgical History:  Procedure Laterality Date   [place]  2018   d/t CHF   CATARACT EXTRACTION Left 2008   CATARACT EXTRACTION W/PHACO Right 03/11/2021   Procedure: CATARACT EXTRACTION PHACO AND INTRAOCULAR LENS PLACEMENT ([place]) RIGHT;  Surgeon: Leandrew Koyanagi, MD;  Location: [facility];  Service: Ophthalmology;  Laterality: Right;  7.70 01:31.1   ESOPHAGOGASTRODUODENOSCOPY (EGD) WITH PROPOFOL N/A 08/04/2022   Procedure: ESOPHAGOGASTRODUODENOSCOPY (EGD) WITH PROPOFOL;  Surgeon: Lesly Rubenstein, MD;  Location: [facility];  Service: Endoscopy;   Laterality: N/A;   JOINT REPLACEMENT Right 06/2020   KNEE CLOSED REDUCTION Right 10/29/2020   Procedure: CLOSED MANIPULATION KNEE;  Surgeon: Lovell Sheehan, MD;  Location: [facility];  Service: Orthopedics;  Laterality: Right;   RIGHT/LEFT HEART CATH AND CORONARY ANGIOGRAPHY N/A 04/21/2017   Procedure: RIGHT/LEFT HEART CATH AND CORONARY ANGIOGRAPHY;  Surgeon: Wellington Hampshire, MD;  Location: [facility];  Service: Cardiovascular;  Laterality: N/A;   TOTAL KNEE ARTHROPLASTY Right 07/02/2020   Procedure: TOTAL KNEE ARTHROPLASTY;  Surgeon: Lovell Sheehan, MD;  Location: [facility];  Service: Orthopedics;  Laterality: Right;   TOTAL KNEE ARTHROPLASTY Left 06/08/2022   Procedure: TOTAL KNEE ARTHROPLASTY;  Surgeon: Lovell Sheehan, MD;  Location: [facility];  Service: Orthopedics;  Laterality: Left;   Family History  Problem Relation Age of Onset   Hypertension Mother    Congestive Heart Failure Mother    Congestive Heart Failure Father    Prostate cancer Son    Social History   Tobacco Use   Smoking status: Former    Years: 10.00    Types: Cigarettes    Start date: 09/14/1975    Quit date: 06/22/1986    Years since quitting: 36.1   Smokeless tobacco: Never  Substance Use Topics   Alcohol use: No    Alcohol/week: 0.0 standard drinks of alcohol   No Known Allergies  Prior to Admission medications   Medication Sig Start Date End Date Taking? Authorizing Provider  carvedilol (COREG) 6.25 MG tablet TAKE 1 TABLET BY MOUTH TWICE  DAILY WITH MEALS 01/22/22  Yes Arida, Mertie Clause, MD  ELIQUIS 5 MG TABS tablet TAKE 1 TABLET BY MOUTH  TWICE DAILY 01/22/22  Yes Wellington Hampshire, MD  ENTRESTO 97-103 MG TAKE 1 TABLET BY MOUTH TWICE  DAILY 01/22/22  Yes Wellington Hampshire, MD  HYDROcodone-acetaminophen (NORCO/VICODIN) 5-325 MG tablet Take 1 tablet by mouth every 4 (four) hours as needed for moderate pain (pain score 4-6). 06/09/22  Yes Carlynn Spry, PA-C  levocetirizine (XYZAL) 5 MG tablet TAKE 1  TABLET BY MOUTH IN Kimberly  EVENING 06/25/22  Yes Sowles, Drue Stager, MD  Multiple Vitamin (MULTIVITAMIN WITH MINERALS) TABS tablet Take 1 tablet by mouth daily.   Yes [provider]  pantoprazole (PROTONIX) 40 MG tablet Take 1 tablet (40 mg total) by mouth 2 (two) times daily before a meal. 08/04/22 11/02/22 Yes Fritzi Mandes, MD  potassium chloride SA (KLOR-CON M) 20 MEQ tablet TAKE 1 TABLET BY MOUTH DAILY 06/21/22  Yes Wellington Hampshire, MD  rosuvastatin (CRESTOR) 40 MG tablet TAKE 1 TABLET BY MOUTH DAILY 04/26/22  Yes Wellington Hampshire, MD  spironolactone (ALDACTONE) 25 MG tablet TAKE 1 TABLET BY MOUTH DAILY 01/22/22  Yes Wellington Hampshire, MD  bisacodyl (DULCOLAX) 10 MG suppository Place 1 suppository (10 mg total) rectally daily as needed for moderate constipation. Henry not taking: Reported on 08/12/2022 06/09/22   Carlynn Spry, PA-C   Review of Systems  Constitutional:  Positive for fatigue (tire easily). Negative for appetite  change.  HENT:  Negative for congestion and postnasal drip.   Eyes: Negative.   Respiratory:  Negative for cough, chest tightness, shortness of breath and wheezing.   Cardiovascular:  Negative for chest pain, palpitations and leg swelling.  Gastrointestinal:  Negative for abdominal distention and abdominal pain.  Endocrine: Negative.   Genitourinary: Negative.   Musculoskeletal:  Positive for arthralgias (right knee pain improving). Negative for back pain.  Skin: Negative.   Allergic/Immunologic: Negative.   Neurological:  Negative for dizziness and light-headedness.  Hematological:  Negative for adenopathy. Does not bruise/bleed easily.  Psychiatric/Behavioral:  Negative for dysphoric mood and sleep disturbance (sleeping on 1-2 pillows). Kimberly Henry is not nervous/anxious.    Vitals:   08/12/22 1554  BP: (!) 156/80  Pulse: 72  Resp: 18  SpO2: 100%  Weight: 277 lb (125.6 kg)   Wt Readings from Last 3 Encounters:  08/12/22 277 lb (125.6 kg)  08/02/22  287 lb 11.2 oz (130.5 kg)  08/02/22 287 lb 9.6 oz (130.5 kg)   Lab Results  Component Value Date   CREATININE 1.41 (H) 08/04/2022   CREATININE 1.49 (H) 08/03/2022   CREATININE 1.62 (H) 08/02/2022   Physical Exam Vitals and nursing note reviewed.  Constitutional:      Appearance: Kimberly Henry is well-developed.  HENT:     Head: Normocephalic and atraumatic.  Neck:     Vascular: No JVD.  Cardiovascular:     Rate and Rhythm: Normal rate and regular rhythm.  Pulmonary:     Effort: Pulmonary effort is normal.     Breath sounds: No wheezing or rales.  Abdominal:     General: There is no distension.     Palpations: Abdomen is soft.     Tenderness: There is no abdominal tenderness.  Musculoskeletal:        General: No tenderness.     Cervical back: Normal range of motion and neck supple.  Skin:    General: Skin is warm and dry.  Neurological:     Mental Status: Kimberly Henry is alert and oriented to person, place, and time.  Psychiatric:        Behavior: Behavior normal.        Thought Content: Thought content normal.    Assessment & Plan:  1: Chronic heart failure with mildly reduced ejection fraction- - NYHA class III - Euvolemic today - weighing daily; reminded to call for an overnight weight gain of >2 pounds or a weekly weight gain of >5 pounds - on GDMT of carvedilol, entresto and spironolactone - will add farxiga '10mg'$  daily; 30 day voucher provided - will check BMP next visit - Not adding salt and has been reading food labels; using no sodium seasoning such as Mrs. Dash.  - Does wear support socks & elevates her legs - dig level on 01/07/21 was <0.5 - saw cardiology Kathlen Mody) 05/24/22 - BNP 08/02/22 was 740.4  2: HTN- - BP mildly elevated (156/80) - saw PCP Lucio Edward) 08/02/22 - BMP from 08/04/22 reviewed and showed sodium 141, potassium 4.1, creatinine 1.41 and GFR 37  3: Anemia- - EGD done during recent admission - hemoglobin 08/04/22 was 9.2   Medication list reviewed.    Return in 1 month, sooner if needed.

## 2022-08-13 ENCOUNTER — Encounter: Payer: Self-pay | Admitting: Family

## 2022-08-17 ENCOUNTER — Inpatient Hospital Stay: Payer: Medicare Other | Admitting: Family Medicine

## 2022-08-25 NOTE — Progress Notes (Unsigned)
Name: Kimberly Henry   MRN: [id number]    DOB: [birth year]   Date:08/26/2022       Progress Note  Subjective  Chief Complaint  Follow Up  HPI  CHF: diagnosed in 04/2017, secondary to non ischemic cardiomyopathy.  She is on beta-blocker, spironolactone, Entresto and Farxiga, she was recently admitted with acute on chronic CHF, discharge 11/22 and is feeling back to baseline . Denies orthopnea, lower extremity swelling is down. She had a cath done in 2018 showed severe pulmonary hypertension   CT angiography chest with contrast doe 07/2022 - during hospital stay   1. Negative examination for pulmonary embolism. 2. Diffuse bilateral bronchial wall thickening, interlobular septal thickening, and trace bilateral pleural effusions, consistent with pulmonary edema. 3. Gross cardiomegaly and coronary artery disease. 4. Enlargement of the main pulmonary artery, as can be seen in pulmonary hypertension.   Aortic Atherosclerosis (ICD10-I70.0).  Atrial Fibrilation: on Eliquis and is rate controlled with carvedilol, no side effects, denies blood in stools or gum. She was recently diagnosed with bleeding gastric ulcer, she was off eliquis for 3 days after EGD but is back on it. Denies palpitation  Study Conclusions / ECHO 01/2020  1. Left ventricular ejection fraction, by estimation, is 50 to 55%. Left ventricular ejection fraction by PLAX is 50 %. The left ventricle has low normal function. The left ventricle has no regional wall motion abnormalities. Left ventricular diastolic parameters were normal. 2. Right ventricular systolic function is normal. The right ventricular size is normal. There is mildly elevated pulmonary artery systolic pressure. 3. Left atrial size was mildly dilated. 4. The mitral valve is grossly normal. Trivial mitral valve regurgitation. 5. The aortic valve is grossly normal. Aortic valve regurgitation is not visualized. No aortic stenosis is present   Cardiac cath  04/21/2017  1. Mild nonobstructive coronary artery disease. 2. Moderately to severely reduced LV systolic function with an EF of 25-35%. 3. Right heart catheterization showed moderately elevated filling pressures, severe pulmonary hypertension and severely reduced cardiac output at 3.14 with a cardiac index of 1.38. Pulmonary Wedge pressure was 27 mmHg and PA pressure was 61/34 with a mean of 47 mmHg   HTN: she has been taking medication daily, no chest pain or palpitation, denies dizziness. She has SOB with activity but is back to baseline    OA: she has a long history of OA of both knees. She had total right  knee replacement done by Dr. Harlow Mares 06/2020 and had to go back for intra operative manipulation 10/2020, she had left knee replacement 05/2022, she is doing much better, able to walk, no pain just stiffness intermittently   Perennial allergic rhinitis: she states symptoms controlled with medication denies rhinorrhea, nasal congestion or sneezing.  We will send refill   Chronic bronchitis: she smoked about 1 pack a day for about 20 years but quit many years ago, she states she has been doing well, no cough, wheezing and has stable  SOB with moderate activity  that is likely multifactorial.  Morbid Obesity: weight is finally trending down again, it was 282 lbs when she saw me in June and is down to 275.9 lbs. She is trying to eat healthier and avoiding salt     Atherosclerosis of aorta: on statin therapy and bp is controlled, LDL goal is below 70, we will recheck it today   Dyslipidemia: she is now on Rosuvastatin 40 mg and last LDL was 77, discussed adding Zetia but she said no last visit,  we will recheck labs today   CKI stage 3a: we will recheck level, she was admitted and had acute on chronic renal failure , we will recheck GFR. She is now on farxiga also takes ARB in the form of Entresto   Gastric ulcer: she went to [facility] with sob and previous to admission also had melena. She had EGD  and showed an ulcer, taking PPI , recommended to take it for 2 months. She denies any blood in stools or epigastric pain   Patient Active Problem List   Diagnosis Date Noted   Pulmonary hypertension ([place]) 08/26/2022   Hyperkalemia 08/03/2022   Melena 08/03/2022   Acute on chronic diastolic CHF (congestive heart failure) ([place]) 08/02/2022   S/P TKR (total knee replacement) using cement, left 06/08/2022   Status post knee replacement 06/08/2022   Stage 3a chronic kidney disease ([place]) 02/26/2022   Arthritis of left knee 06/09/2021   S/P TKR (total knee replacement) using cement, right 07/02/2020   Simple chronic bronchitis (HCC)    CAD (coronary artery disease)    Chronic a-fib (HCC)    Bradycardia    Lung nodule seen on imaging study 01/09/2020   NICM (nonischemic cardiomyopathy) ([place]) 01/09/2020   Morbid obesity ([place]) 21/19/4174   Chronic systolic heart failure ([place]) 04/28/2017   AKI (acute kidney injury) ([place]) 04/19/2017   Aortic atherosclerosis ([place]) 03/10/2017   Perennial allergic rhinitis with seasonal variation 05/30/2015   1st degree AV block 04/26/4817   Metabolic syndrome 56/31/4970   History of cervical cancer 03/03/2015   Hypertension, benign 03/03/2015   Osteoarthritis, knee 03/03/2015   Dyslipidemia 03/03/2015    Past Surgical History:  Procedure Laterality Date   [place]  2018   d/t CHF   CATARACT EXTRACTION Left 2008   CATARACT EXTRACTION W/PHACO Right 03/11/2021   Procedure: CATARACT EXTRACTION PHACO AND INTRAOCULAR LENS PLACEMENT ([place]) RIGHT;  Surgeon: Leandrew Koyanagi, MD;  Location: [facility];  Service: Ophthalmology;  Laterality: Right;  7.70 01:31.1   ESOPHAGOGASTRODUODENOSCOPY (EGD) WITH PROPOFOL N/A 08/04/2022   Procedure: ESOPHAGOGASTRODUODENOSCOPY (EGD) WITH PROPOFOL;  Surgeon: Lesly Rubenstein, MD;  Location: [facility];  Service: Endoscopy;  Laterality: N/A;   JOINT REPLACEMENT Right  06/2020   KNEE CLOSED REDUCTION Right 10/29/2020   Procedure: CLOSED MANIPULATION KNEE;  Surgeon: Lovell Sheehan, MD;  Location: [facility];  Service: Orthopedics;  Laterality: Right;   RIGHT/LEFT HEART CATH AND CORONARY ANGIOGRAPHY N/A 04/21/2017   Procedure: RIGHT/LEFT HEART CATH AND CORONARY ANGIOGRAPHY;  Surgeon: Wellington Hampshire, MD;  Location: [facility];  Service: Cardiovascular;  Laterality: N/A;   TOTAL KNEE ARTHROPLASTY Right 07/02/2020   Procedure: TOTAL KNEE ARTHROPLASTY;  Surgeon: Lovell Sheehan, MD;  Location: [facility];  Service: Orthopedics;  Laterality: Right;   TOTAL KNEE ARTHROPLASTY Left 06/08/2022   Procedure: TOTAL KNEE ARTHROPLASTY;  Surgeon: Lovell Sheehan, MD;  Location: [facility];  Service: Orthopedics;  Laterality: Left;    Family History  Problem Relation Age of Onset   Hypertension Mother    Congestive Heart Failure Mother    Congestive Heart Failure Father    Prostate cancer Son     Social History   Tobacco Use   Smoking status: Former    Years: 10.00    Types: Cigarettes    Start date: 09/14/1975    Quit date: 06/22/1986    Years since quitting: 36.2   Smokeless tobacco: Never  Substance Use Topics  Alcohol use: No    Alcohol/week: 0.0 standard drinks of alcohol     Current Outpatient Medications:    carvedilol (COREG) 6.25 MG tablet, TAKE 1 TABLET BY MOUTH TWICE  DAILY WITH MEALS, Disp: 180 tablet, Rfl: 3   dapagliflozin propanediol (FARXIGA) 10 MG TABS tablet, Take 1 tablet (10 mg total) by mouth daily before breakfast., Disp: 30 tablet, Rfl: 5   ELIQUIS 5 MG TABS tablet, TAKE 1 TABLET BY MOUTH  TWICE DAILY, Disp: 180 tablet, Rfl: 3   ENTRESTO 97-103 MG, TAKE 1 TABLET BY MOUTH TWICE  DAILY, Disp: 180 tablet, Rfl: 3   Multiple Vitamin (MULTIVITAMIN WITH MINERALS) TABS tablet, Take 1 tablet by mouth daily., Disp: , Rfl:    pantoprazole (PROTONIX) 40 MG tablet, Take 1 tablet (40 mg total) by mouth 2 (two) times daily before a meal., Disp: 60  tablet, Rfl: 2   potassium chloride SA (KLOR-CON M) 20 MEQ tablet, TAKE 1 TABLET BY MOUTH DAILY, Disp: 80 tablet, Rfl: 3   spironolactone (ALDACTONE) 25 MG tablet, TAKE 1 TABLET BY MOUTH DAILY, Disp: 90 tablet, Rfl: 3   levocetirizine (XYZAL) 5 MG tablet, Take 1 tablet (5 mg total) by mouth every evening., Disp: 90 tablet, Rfl: 1   rosuvastatin (CRESTOR) 40 MG tablet, Take 1 tablet (40 mg total) by mouth daily., Disp: 90 tablet, Rfl: 1  No Known Allergies  I personally reviewed active problem list, medication list, allergies, family history, social history, health maintenance with the patient/caregiver today.   ROS  Constitutional: Negative for fever , positive for  weight change.  Respiratory: Negative for cough , positive for mild shortness of breath with activity .   Cardiovascular: Negative for chest pain or palpitations.  Gastrointestinal: Negative for abdominal pain, no bowel changes.  Musculoskeletal: Negative for gait problem or joint swelling.  Skin: Negative for rash.  Neurological: Negative for dizziness or headache.  No other specific complaints in a complete review of systems (except as listed in HPI above).   Objective  Vitals:   08/26/22 0829  BP: 130/82  Pulse: 75  Resp: 18  Temp: 97.7 F (36.5 C)  TempSrc: Oral  SpO2: 99%  Weight: 275 lb 14.4 oz (125.1 kg)  Height: '5\' 7"'$  (1.702 m)    Body mass index is 43.21 kg/m.  Physical Exam  Constitutional: Patient appears well-developed and well-nourished. Obese  No distress.  HEENT: head atraumatic, normocephalic, pupils equal and reactive to light, neck supple Cardiovascular: Normal rate, irregular rhythm and normal heart sounds.  No murmur heard. trace BLE edema. Pulmonary/Chest: Effort normal and breath sounds normal. No respiratory distress. Abdominal: Soft.  There is no tenderness. Psychiatric: Patient has a normal mood and affect. behavior is normal. Judgment and thought content normal.    PHQ2/9:     08/26/2022    8:31 AM 08/02/2022    2:12 PM 02/26/2022    8:53 AM 07/09/2021    8:56 AM 06/29/2021    2:29 PM  Depression screen PHQ 2/9  Decreased Interest 0 0 0 0 0  Down, Depressed, Hopeless 0 0 0 0 0  PHQ - 2 Score 0 0 0 0 0  Altered sleeping 0 0 0 0 0  Tired, decreased energy 0 0 3 0 0  Change in appetite 0 0 0 0 0  Feeling bad or failure about yourself  0 0 0 0 0  Trouble concentrating 0 0 0 0 0  Moving slowly or fidgety/restless 0 0 0 0 0  Suicidal thoughts 0 0 0 0 0  PHQ-9 Score 0 0 3 0 0  Difficult doing work/chores  Not difficult at all   Not difficult at all    phq 9 is negative   Fall Risk:    08/26/2022    8:31 AM 08/02/2022    2:12 PM 02/26/2022    8:53 AM 07/09/2021    8:56 AM 06/29/2021    2:29 PM  Fall Risk   Falls in the past year? 0 0 0 0 0  Number falls in past yr:  0 0  0  Injury with Fall?  0 0  0  Risk for fall due to : No Fall Risks No Fall Risks Impaired balance/gait  No Fall Risks  Follow up Falls prevention discussed;Education provided;Falls evaluation completed Falls prevention discussed;Education provided;Falls evaluation completed Falls prevention discussed Falls prevention discussed Falls prevention discussed      Functional Status Survey: Is the patient deaf or have difficulty hearing?: No Does the patient have difficulty seeing, even when wearing glasses/contacts?: No Does the patient have difficulty concentrating, remembering, or making decisions?: No Does the patient have difficulty walking or climbing stairs?: Yes Does the patient have difficulty dressing or bathing?: No Does the patient have difficulty doing errands alone such as visiting a doctor's office or shopping?: No    Assessment & Plan  1. Chronic a-fib (HCC)  Rate controlled, on Eliquis  2. Chronic systolic heart failure (HCC)  Going to CHF clinic, recently started on Farxiga   3. Stage 3a chronic kidney disease (HCC)  - COMPLETE METABOLIC PANEL WITH GFR  4.  Aortic atherosclerosis (HCC)  - rosuvastatin (CRESTOR) 40 MG tablet; Take 1 tablet (40 mg total) by mouth daily.  Dispense: 90 tablet; Refill: 1 - Lipid panel  5. Morbid obesity ([place])  Discussed with the patient the risk posed by an increased BMI. Discussed importance of portion control, calorie counting and at least 150 minutes of physical activity weekly. Avoid sweet beverages and drink more water. Eat at least 6 servings of fruit and vegetables daily    6. Pulmonary hypertension ([place])  Under the care of cardiologist   7. Simple chronic bronchitis (HCC)  Stable   8. NICM (nonischemic cardiomyopathy) ([place])  Under the care of cardiologist   9. DOE (dyspnea on exertion)   10. Metabolic syndrome   11. Primary osteoarthritis of both knees  Doing well   12. Dyslipidemia  - rosuvastatin (CRESTOR) 40 MG tablet; Take 1 tablet (40 mg total) by mouth daily.  Dispense: 90 tablet; Refill: 1  13. Acute gastric ulcer with hemorrhage  - CBC with Differential/Platelet - Iron, TIBC and Ferritin Panel  14. Perennial allergic rhinitis with seasonal variation  - levocetirizine (XYZAL) 5 MG tablet; Take 1 tablet (5 mg total) by mouth every evening.  Dispense: 90 tablet; Refill: 1

## 2022-08-26 ENCOUNTER — Ambulatory Visit (INDEPENDENT_AMBULATORY_CARE_PROVIDER_SITE_OTHER): Payer: Medicare Other | Admitting: Family Medicine

## 2022-08-26 ENCOUNTER — Encounter: Payer: Self-pay | Admitting: Family Medicine

## 2022-08-26 VITALS — BP 130/82 | HR 75 | Temp 97.7°F | Resp 18 | Ht 67.0 in | Wt 275.9 lb

## 2022-08-26 DIAGNOSIS — I700 Atherosclerosis of aorta: Secondary | ICD-10-CM | POA: Diagnosis not present

## 2022-08-26 DIAGNOSIS — E785 Hyperlipidemia, unspecified: Secondary | ICD-10-CM

## 2022-08-26 DIAGNOSIS — J3089 Other allergic rhinitis: Secondary | ICD-10-CM

## 2022-08-26 DIAGNOSIS — M170 Bilateral primary osteoarthritis of knee: Secondary | ICD-10-CM | POA: Diagnosis not present

## 2022-08-26 DIAGNOSIS — I272 Pulmonary hypertension, unspecified: Secondary | ICD-10-CM

## 2022-08-26 DIAGNOSIS — I482 Chronic atrial fibrillation, unspecified: Secondary | ICD-10-CM | POA: Diagnosis not present

## 2022-08-26 DIAGNOSIS — J410 Simple chronic bronchitis: Secondary | ICD-10-CM | POA: Diagnosis not present

## 2022-08-26 DIAGNOSIS — E8881 Metabolic syndrome: Secondary | ICD-10-CM | POA: Diagnosis not present

## 2022-08-26 DIAGNOSIS — N1831 Chronic kidney disease, stage 3a: Secondary | ICD-10-CM

## 2022-08-26 DIAGNOSIS — I428 Other cardiomyopathies: Secondary | ICD-10-CM

## 2022-08-26 DIAGNOSIS — I5022 Chronic systolic (congestive) heart failure: Secondary | ICD-10-CM | POA: Diagnosis not present

## 2022-08-26 DIAGNOSIS — K250 Acute gastric ulcer with hemorrhage: Secondary | ICD-10-CM | POA: Diagnosis not present

## 2022-08-26 DIAGNOSIS — J302 Other seasonal allergic rhinitis: Secondary | ICD-10-CM

## 2022-08-26 DIAGNOSIS — R0609 Other forms of dyspnea: Secondary | ICD-10-CM | POA: Diagnosis not present

## 2022-08-26 MED ORDER — ROSUVASTATIN CALCIUM 40 MG PO TABS: 40.0000 mg | ORAL_TABLET | Freq: Every day | ORAL | 1 refills | Status: DC

## 2022-08-26 MED ORDER — LEVOCETIRIZINE DIHYDROCHLORIDE 5 MG PO TABS: 5.0000 mg | ORAL_TABLET | Freq: Every evening | ORAL | 1 refills | Status: DC

## 2022-08-27 LAB — LIPID PANEL
Cholesterol: 133 mg/dL (ref <200)
HDL: 49 mg/dL — ABNORMAL LOW (ref >=50)
LDL Cholesterol (Calc): 68 mg/dL (calc)
Non-HDL Cholesterol (Calc): 84 mg/dL (calc) (ref <130)
Total CHOL/HDL Ratio: 2.7 (calc) (ref <5.0)
Triglycerides: 77 mg/dL (ref <150)

## 2022-08-27 LAB — COMPLETE METABOLIC PANEL WITH GFR
AG Ratio: 1.6 (calc) (ref 1.0–2.5)
ALT: 11 U/L (ref 6–29)
AST: 17 U/L (ref 10–35)
Albumin: 4.2 g/dL (ref 3.6–5.1)
Alkaline phosphatase (APISO): 114 U/L (ref 37–153)
BUN/Creatinine Ratio: 10 (calc) (ref 6–22)
BUN: 11 mg/dL (ref 7–25)
CO2: 25 mmol/L (ref 20–32)
Calcium: 9.6 mg/dL (ref 8.6–10.4)
Chloride: 111 mmol/L — ABNORMAL HIGH (ref 98–110)
Creat: 1.06 mg/dL — ABNORMAL HIGH (ref 0.60–0.95)
Globulin: 2.6 g/dL (calc) (ref 1.9–3.7)
Glucose, Bld: 90 mg/dL (ref 65–99)
Potassium: 4.1 mmol/L (ref 3.5–5.3)
Sodium: 144 mmol/L (ref 135–146)
Total Bilirubin: 0.6 mg/dL (ref 0.2–1.2)
Total Protein: 6.8 g/dL (ref 6.1–8.1)
eGFR: 53 mL/min/{1.73_m2} — ABNORMAL LOW (ref >=60)

## 2022-08-27 LAB — CBC WITH DIFFERENTIAL/PLATELET
Absolute Monocytes: 221 cells/uL (ref 200–950)
Basophils Absolute: 19 cells/uL (ref 0–200)
Basophils Relative: 0.6 %
Eosinophils Absolute: 160 cells/uL (ref 15–500)
Eosinophils Relative: 5 %
HCT: 32.4 % — ABNORMAL LOW (ref 35.0–45.0)
Hemoglobin: 10.9 g/dL — ABNORMAL LOW (ref 11.7–15.5)
Lymphs Abs: 1206 cells/uL (ref 850–3900)
MCH: 31.1 pg (ref 27.0–33.0)
MCHC: 33.6 g/dL (ref 32.0–36.0)
MCV: 92.3 fL (ref 80.0–100.0)
MPV: 10.7 fL (ref 7.5–12.5)
Monocytes Relative: 6.9 %
Neutro Abs: 1594 cells/uL (ref 1500–7800)
Neutrophils Relative %: 49.8 %
Platelets: 264 10*3/uL (ref 140–400)
RBC: 3.51 10*6/uL — ABNORMAL LOW (ref 3.80–5.10)
RDW: 13.1 % (ref 11.0–15.0)
Total Lymphocyte: 37.7 %
WBC: 3.2 10*3/uL — ABNORMAL LOW (ref 3.8–10.8)

## 2022-08-27 LAB — IRON,TIBC AND FERRITIN PANEL
%SAT: 18 % (calc) (ref 16–45)
Ferritin: 161 ng/mL (ref 16–288)
Iron: 56 ug/dL (ref 45–160)
TIBC: 307 mcg/dL (calc) (ref 250–450)

## 2022-09-10 ENCOUNTER — Encounter: Payer: Medicare Other | Admitting: Family

## 2022-09-25 NOTE — Progress Notes (Deleted)
Patient ID: Kimberly Henry, female    DOB: 1941-08-14, 82 y.o.   MRN: [id number]  HPI  Kimberly Henry is a 82 y/o female with a history of AV block, COPD, hyperlipidemia, HTN, osteoarthritis, remote tobacco use and chronic heart failure.  Echo report from 08/03/22 showed an EF of 45-50% along with mildly elevated PA pressure, severe LAE, mild MR and mild/moderate TR. Echo report from 01/22/20 reviewed and showed an EF of 50-55% along with mildly elevated PA pressure. Echo report from 08/26/17 reviewed and showed an EF of 30-35% along with moderate MR and normal PA pressure. Echo from 04/19/17 reviewed and shows an EF of 30-35% along with mild MR and moderate TR.   Right/Left Cardiac catheterization was done 04/21/17 and showed mild nonobstructive CAD with an EF of 25-35%. Moderately elevated filling pressure, severe pulmonary HTN and severely reduced cardiac output at 3.14 with a cardiac index of 1.38. PA pressure was 61/34 with a mean of 47 mm Hg.   Admitted 08/02/22 due to shortness of breath, leg swelling, weight gain and diarrhea. Also noticed dark stools. Initially given IV lasix with transition or oral diuretics. GI consult obtained. ReDs clip reading 19%. Hyperkalemia improved. Eliquis held and IV protonix given. EGD performed. Eliquis resumed. Discharged after 2 days.   She presents today for a follow up visit with a chief complaint of  Past Medical History:  Diagnosis Date   Acid phosphatase elevated    Aortic atherosclerosis ([place])    Atrial fibrillation ([place])    a.) CHA2DS2VASc = 6 (age x 2, sex, HFrEF, HTN, vascular disease history);  b.) rate/rhythm maintained on oral carvedilol; chronically anticoagulated with apixaban   AV block, 1st degree    Cervical cancer ([place]) 1999   COPD (chronic obstructive pulmonary disease) ([place])    HFrEF (heart failure with reduced ejection fraction) ([place])    a.) 04/2017 Echo: EF 30-35%, antsept HK, mild MR, mod dil LA, mildly dil RA, mod TR, mildly to mod  elev PASP; b.) 08/2017 Echo: EF 30-35%, diff HK, antsept HK. Gr1 DD. Mild to mod MR. PASP nl; c.) TTE 01/22/2020: EF 50-55%, mild LAE, triv MR; RVSP 38.3   Hx of cervical malignancy    Hyperlipidemia    Hypertension    LBBB (left bundle branch block)    Long term current use of anticoagulant    a.) apixaban   Metabolic syndrome    NICM (nonischemic cardiomyopathy) ([place])    a.) 04/2017 Echo: EF 30-35%; b.) 04/2017 Cath: nonobs dzs; c.) 08/2017 Echo: EF 30-35%, diff HK, antsept HK. Gr1 DD. Mild to mod MR. PASP nl; d.) TTE 01/22/2020: EF 50-55%, mild LAE, triv MR, RVSP 38.3   Non-obstructive CAD (coronary artery disease)    a. 04/2017 Cath: mild nonobs CAD. EF 25-35%. CO 3.14, CI 1.38. Sev PAH [61/34(47].   Obesity, Class III, BMI 40-49.9 (morbid obesity) (HCC)    Osteoarthritis of both knees    PAH (pulmonary arterial hypertension) with portal hypertension ([place])    a.) 04/2017 Right Heart Cath: Sev PAH 61/34(47); b.) 08/2017 Echo: nl PASP; c.) TTE 01/22/2020: RVSP 38.3   Past Surgical History:  Procedure Laterality Date   ABDOMINAL HYSTERECTOMY  1996   CARDIAC CATHETERIZATION  2018   d/t CHF   CATARACT EXTRACTION Left 2008   CATARACT EXTRACTION W/PHACO Right 03/11/2021   Procedure: CATARACT EXTRACTION PHACO AND INTRAOCULAR LENS PLACEMENT ([place]) RIGHT;  Surgeon: Leandrew Koyanagi, MD;  Location: [facility];  Service: Ophthalmology;  Laterality:  Right;  7.70 01:31.1   ESOPHAGOGASTRODUODENOSCOPY (EGD) WITH PROPOFOL N/A 08/04/2022   Procedure: ESOPHAGOGASTRODUODENOSCOPY (EGD) WITH PROPOFOL;  Surgeon: Lesly Rubenstein, MD;  Location: [facility];  Service: Endoscopy;  Laterality: N/A;   JOINT REPLACEMENT Right 06/2020   KNEE CLOSED REDUCTION Right 10/29/2020   Procedure: CLOSED MANIPULATION KNEE;  Surgeon: Lovell Sheehan, MD;  Location: [facility];  Service: Orthopedics;  Laterality: Right;   RIGHT/LEFT HEART CATH AND CORONARY ANGIOGRAPHY N/A 04/21/2017   Procedure: RIGHT/LEFT HEART  CATH AND CORONARY ANGIOGRAPHY;  Surgeon: Wellington Hampshire, MD;  Location: [facility];  Service: Cardiovascular;  Laterality: N/A;   TOTAL KNEE ARTHROPLASTY Right 07/02/2020   Procedure: TOTAL KNEE ARTHROPLASTY;  Surgeon: Lovell Sheehan, MD;  Location: [facility];  Service: Orthopedics;  Laterality: Right;   TOTAL KNEE ARTHROPLASTY Left 06/08/2022   Procedure: TOTAL KNEE ARTHROPLASTY;  Surgeon: Lovell Sheehan, MD;  Location: [facility];  Service: Orthopedics;  Laterality: Left;   Family History  Problem Relation Age of Onset   Hypertension Mother    Congestive Heart Failure Mother    Congestive Heart Failure Father    Prostate cancer Son    Social History   Tobacco Use   Smoking status: Former    Years: 10.00    Types: Cigarettes    Start date: 09/14/1975    Quit date: 06/22/1986    Years since quitting: 36.2   Smokeless tobacco: Never  Substance Use Topics   Alcohol use: No    Alcohol/week: 0.0 standard drinks of alcohol   No Known Allergies   Review of Systems  Constitutional:  Positive for fatigue (tire easily). Negative for appetite change.  HENT:  Negative for congestion and postnasal drip.   Eyes: Negative.   Respiratory:  Negative for cough, chest tightness, shortness of breath and wheezing.   Cardiovascular:  Negative for chest pain, palpitations and leg swelling.  Gastrointestinal:  Negative for abdominal distention and abdominal pain.  Endocrine: Negative.   Genitourinary: Negative.   Musculoskeletal:  Positive for arthralgias (right knee pain improving). Negative for back pain.  Skin: Negative.   Allergic/Immunologic: Negative.   Neurological:  Negative for dizziness and light-headedness.  Hematological:  Negative for adenopathy. Does not bruise/bleed easily.  Psychiatric/Behavioral:  Negative for dysphoric mood and sleep disturbance (sleeping on 1-2 pillows). The patient is not nervous/anxious.      Physical Exam Vitals and nursing note reviewed.   Constitutional:      Appearance: She is well-developed.  HENT:     Head: Normocephalic and atraumatic.  Neck:     Vascular: No JVD.  Cardiovascular:     Rate and Rhythm: Normal rate and regular rhythm.  Pulmonary:     Effort: Pulmonary effort is normal.     Breath sounds: No wheezing or rales.  Abdominal:     General: There is no distension.     Palpations: Abdomen is soft.     Tenderness: There is no abdominal tenderness.  Musculoskeletal:        General: No tenderness.     Cervical back: Normal range of motion and neck supple.  Skin:    General: Skin is warm and dry.  Neurological:     Mental Status: She is alert and oriented to person, place, and time.  Psychiatric:        Behavior: Behavior normal.        Thought Content: Thought content normal.    Assessment & Plan:  1: Chronic heart failure  with mildly reduced ejection fraction- - NYHA class III - Euvolemic today - weighing daily; reminded to call for an overnight weight gain of >2 pounds or a weekly weight gain of >5 pounds - weight 277 pounds from last visit here 6 weeks ago - on GDMT of carvedilol, entresto, farxiga and spironolactone - will check BMP today as farxiga started at last visit - Not adding salt and has been reading food labels; using no sodium seasoning such as Mrs. Dash.  - Does wear support socks & elevates her legs - dig level on 01/07/21 was <0.5 - saw cardiology Kathlen Mody) 05/24/22 - BNP 08/02/22 was 740.4  2: HTN- - BP  - saw PCP (Sowles) 08/26/22 - BMP from 08/26/22 reviewed and showed sodium 144, potassium 4.1, creatinine 1.06 and GFR 53  3: Anemia- - EGD done during recent admission - hemoglobin 08/26/22 was 10.9   Medication list reviewed.

## 2022-09-27 ENCOUNTER — Encounter: Payer: Medicare Other | Admitting: Family

## 2022-09-27 ENCOUNTER — Telehealth: Payer: Self-pay | Admitting: Family

## 2022-09-27 NOTE — Telephone Encounter (Signed)
Patient did not show for her [facility] appointment on 09/27/22. Will attempt to reschedule.

## 2022-09-29 ENCOUNTER — Encounter: Payer: Medicare Other | Admitting: Family

## 2022-10-01 ENCOUNTER — Encounter: Payer: Self-pay | Admitting: Family

## 2022-10-01 ENCOUNTER — Ambulatory Visit: Payer: Medicare Other | Attending: Family | Admitting: Family

## 2022-10-01 ENCOUNTER — Encounter: Payer: Self-pay | Admitting: Pharmacy Technician

## 2022-10-01 VITALS — BP 148/63 | HR 61 | Resp 18 | Wt 277.0 lb

## 2022-10-01 DIAGNOSIS — J449 Chronic obstructive pulmonary disease, unspecified: Secondary | ICD-10-CM | POA: Insufficient documentation

## 2022-10-01 DIAGNOSIS — I5022 Chronic systolic (congestive) heart failure: Secondary | ICD-10-CM | POA: Diagnosis not present

## 2022-10-01 DIAGNOSIS — D649 Anemia, unspecified: Secondary | ICD-10-CM

## 2022-10-01 DIAGNOSIS — I110 Hypertensive heart disease with heart failure: Secondary | ICD-10-CM | POA: Insufficient documentation

## 2022-10-01 DIAGNOSIS — I10 Essential (primary) hypertension: Secondary | ICD-10-CM

## 2022-10-01 DIAGNOSIS — E785 Hyperlipidemia, unspecified: Secondary | ICD-10-CM | POA: Insufficient documentation

## 2022-10-01 NOTE — Patient Instructions (Addendum)
Continue weighing daily and call for an overnight weight gain of 3 pounds or more or a weekly weight gain of more than 5 pounds.   If you have voicemail, please make sure your mailbox is cleaned out so that we may leave a message and please make sure to listen to any voicemails.    Check your blood pressure daily, write the readings down and bring them to you next appointment.

## 2022-10-01 NOTE — Progress Notes (Signed)
Patient ID: Kimberly Henry, female    DOB: 1940-10-29, 82 y.o.   MRN: [id number]  HPI  Kimberly Henry is a 82 y/o female with a history of AV block, COPD, hyperlipidemia, HTN, osteoarthritis, remote tobacco use and chronic heart failure.  Echo report from 08/03/22 showed an EF of 45-50% along with mildly elevated PA pressure, severe LAE, mild MR and mild/moderate TR. Echo report from 01/22/20 reviewed and showed an EF of 50-55% along with mildly elevated PA pressure. Echo report from 08/26/17 reviewed and showed an EF of 30-35% along with moderate MR and normal PA pressure. Echo from 04/19/17 reviewed and shows an EF of 30-35% along with mild MR and moderate TR.   Right/Left Cardiac catheterization was done 04/21/17 and showed mild nonobstructive CAD with an EF of 25-35%. Moderately elevated filling pressure, severe pulmonary HTN and severely reduced cardiac output at 3.14 with a cardiac index of 1.38. PA pressure was 61/34 with a mean of 47 mm Hg.   Admitted 08/02/22 due to shortness of breath, leg swelling, weight gain and diarrhea. Also noticed dark stools. Initially given IV lasix with transition or oral diuretics. GI consult obtained. ReDs clip reading 19%. Hyperkalemia improved. Eliquis held and IV protonix given. EGD performed. Eliquis resumed. Discharged after 2 days.   She presents today for a follow up visit with a chief complaint of minimal shortness of breath upon moderate exertion. Describes this as chronic in nature. Has associated right knee pain along with this that is improving. She denies any difficulty sleeping, dizziness, abdominal distention, palpitations, pedal edema, chest pain, wheezing, cough or fatigue.   Checks her BP at home but can't state what the numbers have been, only "good". 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   Past Medical History:  Diagnosis Date   Acid phosphatase elevated    Aortic atherosclerosis (HCC)    Atrial  fibrillation (HCC)    a.) CHA2DS2VASc = 6 (age x 2, sex, HFrEF, HTN, vascular disease history);  b.) rate/rhythm maintained on oral carvedilol; chronically anticoagulated with apixaban   AV block, 1st degree    Cervical cancer ([place]) 1999   COPD (chronic obstructive pulmonary disease) ([place])    HFrEF (heart failure with reduced ejection fraction) ([place])    a.) 04/2017 Echo: EF 30-35%, antsept HK, mild MR, mod dil LA, mildly dil RA, mod TR, mildly to mod elev PASP; b.) 08/2017 Echo: EF 30-35%, diff HK, antsept HK. Gr1 DD. Mild to mod MR. PASP nl; c.) TTE 01/22/2020: EF 50-55%, mild LAE, triv MR; RVSP 38.3   Hx of cervical malignancy    Hyperlipidemia    Hypertension    LBBB (left bundle branch block)    Long term current use of anticoagulant    a.) apixaban   Metabolic syndrome    NICM (nonischemic cardiomyopathy) ([place])    a.) 04/2017 Echo: EF 30-35%; b.) 04/2017 Cath: nonobs dzs; c.) 08/2017 Echo: EF 30-35%, diff HK, antsept HK. Gr1 DD. Mild to mod MR. PASP nl; d.) TTE 01/22/2020: EF 50-55%, mild LAE, triv MR, RVSP 38.3   Non-obstructive CAD (coronary artery disease)    a. 04/2017 Cath: mild nonobs CAD. EF 25-35%. CO 3.14, CI 1.38. Sev PAH [61/34(47].   Obesity, Class III, BMI 40-49.9 (morbid obesity) (HCC)    Osteoarthritis of both knees    PAH (pulmonary arterial hypertension) with portal hypertension ([place])    a.) 04/2017 Right Heart Cath: Sev PAH 61/34(47);  b.) 08/2017 Echo: nl PASP; c.) TTE 01/22/2020: RVSP 38.3   Past Surgical History:  Procedure Laterality Date   [place]  2018   d/t CHF   CATARACT EXTRACTION Left 2008   CATARACT EXTRACTION W/PHACO Right 03/11/2021   Procedure: CATARACT EXTRACTION PHACO AND INTRAOCULAR LENS PLACEMENT ([place]) RIGHT;  Surgeon: Leandrew Koyanagi, MD;  Location: [facility];  Service: Ophthalmology;  Laterality: Right;  7.70 01:31.1   ESOPHAGOGASTRODUODENOSCOPY (EGD) WITH PROPOFOL N/A 08/04/2022    Procedure: ESOPHAGOGASTRODUODENOSCOPY (EGD) WITH PROPOFOL;  Surgeon: Lesly Rubenstein, MD;  Location: [facility];  Service: Endoscopy;  Laterality: N/A;   JOINT REPLACEMENT Right 06/2020   KNEE CLOSED REDUCTION Right 10/29/2020   Procedure: CLOSED MANIPULATION KNEE;  Surgeon: Lovell Sheehan, MD;  Location: [facility];  Service: Orthopedics;  Laterality: Right;   RIGHT/LEFT HEART CATH AND CORONARY ANGIOGRAPHY N/A 04/21/2017   Procedure: RIGHT/LEFT HEART CATH AND CORONARY ANGIOGRAPHY;  Surgeon: Wellington Hampshire, MD;  Location: [facility];  Service: Cardiovascular;  Laterality: N/A;   TOTAL KNEE ARTHROPLASTY Right 07/02/2020   Procedure: TOTAL KNEE ARTHROPLASTY;  Surgeon: Lovell Sheehan, MD;  Location: [facility];  Service: Orthopedics;  Laterality: Right;   TOTAL KNEE ARTHROPLASTY Left 06/08/2022   Procedure: TOTAL KNEE ARTHROPLASTY;  Surgeon: Lovell Sheehan, MD;  Location: [facility];  Service: Orthopedics;  Laterality: Left;   Family History  Problem Relation Age of Onset   Hypertension Mother    Congestive Heart Failure Mother    Congestive Heart Failure Father    Prostate cancer Son    Social History   Tobacco Use   Smoking status: Former    Years: 10.00    Types: Cigarettes    Start date: 09/14/1975    Quit date: 06/22/1986    Years since quitting: 36.3   Smokeless tobacco: Never  Substance Use Topics   Alcohol use: No    Alcohol/week: 0.0 standard drinks of alcohol   No Known Allergies  Prior to Admission medications   Medication Sig Start Date End Date Taking? Authorizing Provider  acetaminophen (TYLENOL) 325 MG tablet Take 650 mg by mouth every 6 (six) hours as needed for mild pain or moderate pain.   Yes [provider]  carvedilol (COREG) 6.25 MG tablet TAKE 1 TABLET BY MOUTH TWICE  DAILY WITH MEALS 01/22/22  Yes Wellington Hampshire, MD  dapagliflozin propanediol (FARXIGA) 10 MG TABS tablet Take 1 tablet (10 mg total) by mouth daily before breakfast.  08/12/22  Yes Armiyah Capron A, FNP  ELIQUIS 5 MG TABS tablet TAKE 1 TABLET BY MOUTH  TWICE DAILY 01/22/22  Yes Wellington Hampshire, MD  ENTRESTO 97-103 MG TAKE 1 TABLET BY MOUTH TWICE  DAILY 01/22/22  Yes Wellington Hampshire, MD  levocetirizine (XYZAL) 5 MG tablet Take 1 tablet (5 mg total) by mouth every evening. 08/26/22  Yes Sowles, Drue Stager, MD  Multiple Vitamin (MULTIVITAMIN WITH MINERALS) TABS tablet Take 1 tablet by mouth daily.   Yes [provider]  pantoprazole (PROTONIX) 40 MG tablet Take 1 tablet (40 mg total) by mouth 2 (two) times daily before a meal. 08/04/22 11/02/22 Yes Fritzi Mandes, MD  potassium chloride SA (KLOR-CON M) 20 MEQ tablet TAKE 1 TABLET BY MOUTH DAILY 06/21/22  Yes Wellington Hampshire, MD  rosuvastatin (CRESTOR) 40 MG tablet Take 1 tablet (40 mg total) by mouth daily. 08/26/22  Yes Sowles, Drue Stager, MD  spironolactone (ALDACTONE) 25 MG tablet TAKE  1 TABLET BY MOUTH DAILY 01/22/22  Yes Wellington Hampshire, MD    Review of Systems  Constitutional:  Negative for appetite change and fatigue.  HENT:  Negative for congestion, postnasal drip and sore throat.   Eyes: Negative.   Respiratory:  Positive for shortness of breath. Negative for cough, chest tightness and wheezing.   Cardiovascular:  Negative for chest pain, palpitations and leg swelling.  Gastrointestinal:  Negative for abdominal distention and abdominal pain.  Endocrine: Negative.   Genitourinary: Negative.   Musculoskeletal:  Positive for arthralgias (right knee pain). Negative for back pain.  Skin: Negative.   Allergic/Immunologic: Negative.   Neurological:  Negative for dizziness and light-headedness.  Hematological:  Negative for adenopathy. Does not bruise/bleed easily.  Psychiatric/Behavioral:  Negative for dysphoric mood and sleep disturbance (sleeping on 1-2 pillows). The patient is not nervous/anxious.    Vitals:   10/01/22 0919 10/01/22 0942  BP: (!) 164/77 (!) 148/63  Pulse: 61   Resp: 18   SpO2:  98%   Weight: 277 lb (125.6 kg)    Wt Readings from Last 3 Encounters:  10/01/22 277 lb (125.6 kg)  08/26/22 275 lb 14.4 oz (125.1 kg)  08/12/22 277 lb (125.6 kg)   Lab Results  Component Value Date   CREATININE 1.06 (H) 08/26/2022   CREATININE 1.41 (H) 08/04/2022   CREATININE 1.49 (H) 08/03/2022   Physical Exam Vitals and nursing note reviewed.  Constitutional:      Appearance: She is well-developed.  HENT:     Head: Normocephalic and atraumatic.  Neck:     Vascular: No JVD.  Cardiovascular:     Rate and Rhythm: Normal rate and regular rhythm.  Pulmonary:     Effort: Pulmonary effort is normal.     Breath sounds: No wheezing or rales.  Abdominal:     General: There is no distension.     Palpations: Abdomen is soft.     Tenderness: There is no abdominal tenderness.  Musculoskeletal:        General: No tenderness.     Cervical back: Normal range of motion and neck supple.  Skin:    General: Skin is warm and dry.  Neurological:     Mental Status: She is alert and oriented to person, place, and time.  Psychiatric:        Behavior: Behavior normal.        Thought Content: Thought content normal.    Assessment & Plan:  1: Chronic heart failure with mildly reduced ejection fraction- - NYHA class II - Euvolemic today - weighing daily; reminded to call for an overnight weight gain of >2 pounds or a weekly weight gain of >5 pounds - weight unchanged from last visit here 6 weeks ago - on GDMT of carvedilol, entresto, farxiga and spironolactone - not adding salt and has been reading food labels; using no sodium seasoning such as Mrs. Dash.  - does wear support socks & elevates her legs - dig level on 01/07/21 was <0.5 - saw cardiology Kathlen Mody) 05/24/22 - BNP 08/02/22 was 740.4 - PharmD reconciled meds w/ patient  2: HTN- - BP 164/77; rechecked was 148/63 - advised to check her BP at home, write those readings down and bring to her next OV - saw PCP Ancil Boozer) 08/26/22 -  BMP from 08/26/22 reviewed and showed sodium 144, potassium 4.1, creatinine 1.06 and GFR 53  3: Anemia- - EGD done during recent admission - hemoglobin 08/26/22 was 10.9   Medication list reviewed.  Return in 1 month, sooner if needed.

## 2022-10-01 NOTE — Progress Notes (Signed)
[facility] [facility] - PHARMACIST COUNSELING NOTE  Guideline-Directed Medical Therapy/Evidence Based Medicine  ACE/ARB/ARNI: Sacubitril-valsartan 97-103 mg twice daily Beta Blocker: Carvedilol 6.25 mg twice daily Aldosterone Antagonist: Spironolactone 25 mg daily Diuretic:  None SGLT2i: Dapagliflozin 10 mg daily  Adherence Assessment  Do you ever forget to take your medication? '[]'$ Yes '[x]'$ No  Do you ever skip doses due to side effects? '[]'$ Yes '[x]'$ No  Do you have trouble affording your medicines? '[]'$ Yes '[x]'$ No  Are you ever unable to pick up your medication due to transportation difficulties? '[]'$ Yes '[x]'$ No  Do you ever stop taking your medications because you don't believe they are helping? '[]'$ Yes '[x]'$ No  Do you check your weight daily? '[]'$ Yes '[x]'$ No   Adherence strategy: Pill box  Barriers to obtaining medications: None  Vital signs: HR 61, BP 148/63, weight (pounds) 277 ECHO: Date 07/2022, EF 45-50%     Latest Ref Rng & Units 08/26/2022    9:25 AM 08/04/2022    4:24 AM 08/03/2022    5:10 AM  BMP  Glucose 65 - 99 mg/dL 90  89  92   BUN 7 - 25 mg/dL 11  30  34   Creatinine 0.60 - 0.95 mg/dL 1.06  1.41  1.49   BUN/Creat Ratio 6 - 22 (calc) 10     Sodium 135 - 146 mmol/L 144  141  142   Potassium 3.5 - 5.3 mmol/L 4.1  4.1  4.8   Chloride 98 - 110 mmol/L 111  110  116   CO2 20 - 32 mmol/L '25  24  21   '$ Calcium 8.6 - 10.4 mg/dL 9.6  9.0  8.8     Past Medical History:  Diagnosis Date   Acid phosphatase elevated    Aortic atherosclerosis (HCC)    Atrial fibrillation (HCC)    a.) CHA2DS2VASc = 6 (age x 2, sex, HFrEF, HTN, vascular disease history);  b.) rate/rhythm maintained on oral carvedilol; chronically anticoagulated with apixaban   AV block, 1st degree    Cervical cancer ([place]) 1999   COPD (chronic obstructive pulmonary disease) ([place])    HFrEF (heart failure with reduced ejection fraction) ([place])    a.) 04/2017 Echo: EF 30-35%, antsept HK,  mild MR, mod dil LA, mildly dil RA, mod TR, mildly to mod elev PASP; b.) 08/2017 Echo: EF 30-35%, diff HK, antsept HK. Gr1 DD. Mild to mod MR. PASP nl; c.) TTE 01/22/2020: EF 50-55%, mild LAE, triv MR; RVSP 38.3   Hx of cervical malignancy    Hyperlipidemia    Hypertension    LBBB (left bundle branch block)    Long term current use of anticoagulant    a.) apixaban   Metabolic syndrome    NICM (nonischemic cardiomyopathy) ([place])    a.) 04/2017 Echo: EF 30-35%; b.) 04/2017 Cath: nonobs dzs; c.) 08/2017 Echo: EF 30-35%, diff HK, antsept HK. Gr1 DD. Mild to mod MR. PASP nl; d.) TTE 01/22/2020: EF 50-55%, mild LAE, triv MR, RVSP 38.3   Non-obstructive CAD (coronary artery disease)    a. 04/2017 Cath: mild nonobs CAD. EF 25-35%. CO 3.14, CI 1.38. Sev PAH [61/34(47].   Obesity, Class III, BMI 40-49.9 (morbid obesity) (HCC)    Osteoarthritis of both knees    PAH (pulmonary arterial hypertension) with portal hypertension ([place])    a.) 04/2017 Right Heart Cath: Sev PAH 61/34(47); b.) 08/2017 Echo: nl PASP; c.) TTE 01/22/2020: RVSP 38.3    ASSESSMENT 82 year old female with PMH HTN, Afib,  CAD, pulmHTN, CKD (BL Scr 0.8-0.9), HLD who presents to the HF clinic for follow-up. Most recent ECHO in 07/2022 shows EF 45-50%. Regarding GDMT, patient is taking Entresto 97/'103mg'$  twice daily, carvedilol 6.25 mg twice daily, Farxiga 10 mg daily, and spironolactone 25 mg. Patient does not currently take a loop diuretic, although she is prescribed potassium. Potassium has been WNL and at goal (>4 in the setting of CHF and Afib). She checks BP and weight several times a week.  Recent ED Visit (past 6 months): Date - 07/2022, CC - AoCHF  PLAN CHF/HTN Continue Entresto, carvedilol, Farxiga, and spironolactone She is at target dose of all therapies with the exception of carvedilol, but with her heart rate in low 60s, would not recommend increasing dose Currently above BP goal, will provide patient with home BP log to start  writing down numbers and we will review them at her next visit Recommend checking potassium with next set of labs given that she takes potassium, spironolactone, and Entresto  Afib CHADSVASc 5 Continue Eliquis  HLD 08/2022 LDL 68 Continue Crestor  Time spent: 15 minutes  Will M. Ouida Sills, PharmD PGY-1 Pharmacy Resident 10/01/2022 9:53 AM    Current Outpatient Medications:    acetaminophen (TYLENOL) 325 MG tablet, Take 650 mg by mouth every 6 (six) hours as needed for mild pain or moderate pain., Disp: , Rfl:    carvedilol (COREG) 6.25 MG tablet, TAKE 1 TABLET BY MOUTH TWICE  DAILY WITH MEALS, Disp: 180 tablet, Rfl: 3   dapagliflozin propanediol (FARXIGA) 10 MG TABS tablet, Take 1 tablet (10 mg total) by mouth daily before breakfast., Disp: 30 tablet, Rfl: 5   ELIQUIS 5 MG TABS tablet, TAKE 1 TABLET BY MOUTH  TWICE DAILY, Disp: 180 tablet, Rfl: 3   ENTRESTO 97-103 MG, TAKE 1 TABLET BY MOUTH TWICE  DAILY, Disp: 180 tablet, Rfl: 3   levocetirizine (XYZAL) 5 MG tablet, Take 1 tablet (5 mg total) by mouth every evening., Disp: 90 tablet, Rfl: 1   Multiple Vitamin (MULTIVITAMIN WITH MINERALS) TABS tablet, Take 1 tablet by mouth daily., Disp: , Rfl:    pantoprazole (PROTONIX) 40 MG tablet, Take 1 tablet (40 mg total) by mouth 2 (two) times daily before a meal., Disp: 60 tablet, Rfl: 2   potassium chloride SA (KLOR-CON M) 20 MEQ tablet, TAKE 1 TABLET BY MOUTH DAILY, Disp: 80 tablet, Rfl: 3   rosuvastatin (CRESTOR) 40 MG tablet, Take 1 tablet (40 mg total) by mouth daily., Disp: 90 tablet, Rfl: 1   spironolactone (ALDACTONE) 25 MG tablet, TAKE 1 TABLET BY MOUTH DAILY, Disp: 90 tablet, Rfl: 3   DRUGS TO CAUTION IN HEART FAILURE  Drug or Class Mechanism  Analgesics NSAIDs COX-2 inhibitors Glucocorticoids  Sodium and water retention, increased systemic vascular resistance, decreased response to diuretics   Diabetes Medications Metformin Thiazolidinediones Rosiglitazone  (Avandia) Pioglitazone (Actos) DPP4 Inhibitors Saxagliptin (Onglyza) Sitagliptin (Januvia)   Lactic acidosis Possible calcium channel blockade   Unknown  Antiarrhythmics Class I  Flecainide Disopyramide Class III Sotalol Other Dronedarone  Negative inotrope, proarrhythmic   Proarrhythmic, beta blockade  Negative inotrope  Antihypertensives Alpha Blockers Doxazosin Calcium Channel Blockers Diltiazem Verapamil Nifedipine Central Alpha Adrenergics Moxonidine Peripheral Vasodilators Minoxidil  Increases renin and aldosterone  Negative inotrope    Possible sympathetic withdrawal  Unknown  Anti-infective Itraconazole Amphotericin B  Negative inotrope Unknown  Hematologic Anagrelide Cilostazol   Possible inhibition of PD IV Inhibition of PD III causing arrhythmias  Neurologic/Psychiatric Stimulants Anti-Seizure Drugs Carbamazepine Pregabalin  Antidepressants Tricyclics Citalopram Parkinsons Bromocriptine Pergolide Pramipexole Antipsychotics Clozapine Antimigraine Ergotamine Methysergide Appetite suppressants Bipolar Lithium  Peripheral alpha and beta agonist activity  Negative inotrope and chronotrope Calcium channel blockade  Negative inotrope, proarrhythmic Dose-dependent QT prolongation  Excessive serotonin activity/valvular damage Excessive serotonin activity/valvular damage Unknown  IgE mediated hypersensitivy, calcium channel blockade  Excessive serotonin activity/valvular damage Excessive serotonin activity/valvular damage Valvular damage  Direct myofibrillar degeneration, adrenergic stimulation  Antimalarials Chloroquine Hydroxychloroquine Intracellular inhibition of lysosomal enzymes  Urologic Agents Alpha Blockers Doxazosin Prazosin Tamsulosin Terazosin  Increased renin and aldosterone  Adapted from Page Carleene Overlie, et al. "Drugs That May Cause or Exacerbate Heart Failure: A Scientific Statement from the American Heart   Association." Circulation 2016; 134:e32-e69. DOI: 10.1161/CIR.0000000000000426   MEDICATION ADHERENCES TIPS AND STRATEGIES Taking medication as prescribed improves patient outcomes in heart failure (reduces hospitalizations, improves symptoms, increases survival) Side effects of medications can be managed by decreasing doses, switching agents, stopping drugs, or adding additional therapy. Please let someone in the [facility] know if you have having bothersome side effects so we can modify your regimen. Do not alter your medication regimen without talking to [place].  Medication reminders can help patients remember to take drugs on time. If you are missing or forgetting doses you can try linking behaviors, using pill boxes, or an electronic reminder like an alarm on your phone or an app. Some people can also get automated phone calls as medication reminders.

## 2022-10-14 ENCOUNTER — Other Ambulatory Visit: Payer: Self-pay | Admitting: Cardiovascular Disease

## 2022-10-14 DIAGNOSIS — Z09 Encounter for follow-up examination after completed treatment for conditions other than malignant neoplasm: Secondary | ICD-10-CM | POA: Diagnosis not present

## 2022-10-14 DIAGNOSIS — I4891 Unspecified atrial fibrillation: Secondary | ICD-10-CM

## 2022-10-14 DIAGNOSIS — K279 Peptic ulcer, site unspecified, unspecified as acute or chronic, without hemorrhage or perforation: Secondary | ICD-10-CM | POA: Diagnosis not present

## 2022-10-14 NOTE — Telephone Encounter (Signed)
Prescription refill request for Eliquis received. Indication: Afib  Last office visit: 10/01/22 [facility])  Scr: 1.06 (08/26/22)  Age: 82 Weight: 125.6kg  Appropriate dose. Refill sent.

## 2022-10-14 NOTE — Telephone Encounter (Signed)
Refill request

## 2022-10-27 ENCOUNTER — Other Ambulatory Visit: Payer: Self-pay | Admitting: Family

## 2022-10-27 MED ORDER — DAPAGLIFLOZIN PROPANEDIOL 10 MG PO TABS: 10.0000 mg | ORAL_TABLET | Freq: Every day | ORAL | 3 refills | Status: DC

## 2022-11-01 ENCOUNTER — Encounter: Payer: Medicare Other | Admitting: Family

## 2022-11-03 ENCOUNTER — Other Ambulatory Visit: Payer: Self-pay | Admitting: Family Medicine

## 2022-11-03 DIAGNOSIS — I700 Atherosclerosis of aorta: Secondary | ICD-10-CM

## 2022-11-03 DIAGNOSIS — E785 Hyperlipidemia, unspecified: Secondary | ICD-10-CM

## 2022-11-08 ENCOUNTER — Other Ambulatory Visit: Payer: Self-pay | Admitting: Family Medicine

## 2022-11-08 ENCOUNTER — Other Ambulatory Visit: Payer: Self-pay | Admitting: Cardiovascular Disease

## 2022-11-08 DIAGNOSIS — J302 Other seasonal allergic rhinitis: Secondary | ICD-10-CM

## 2022-12-08 ENCOUNTER — Ambulatory Visit: Payer: Medicare Other | Admitting: Cardiovascular Disease

## 2023-01-02 ENCOUNTER — Other Ambulatory Visit: Payer: Self-pay | Admitting: Cardiovascular Disease

## 2023-01-05 ENCOUNTER — Other Ambulatory Visit: Payer: Self-pay | Admitting: Family Medicine

## 2023-01-05 DIAGNOSIS — I700 Atherosclerosis of aorta: Secondary | ICD-10-CM

## 2023-01-05 DIAGNOSIS — E785 Hyperlipidemia, unspecified: Secondary | ICD-10-CM

## 2023-01-10 ENCOUNTER — Ambulatory Visit: Payer: Medicare Other | Admitting: Family Medicine

## 2023-01-27 ENCOUNTER — Encounter: Payer: Self-pay | Admitting: Cardiovascular Disease

## 2023-01-27 ENCOUNTER — Ambulatory Visit: Payer: Medicare Other | Attending: Cardiovascular Disease | Admitting: Cardiovascular Disease

## 2023-01-27 VITALS — BP 124/60 | HR 64 | Ht 65.0 in | Wt 267.4 lb

## 2023-01-27 DIAGNOSIS — I10 Essential (primary) hypertension: Secondary | ICD-10-CM

## 2023-01-27 DIAGNOSIS — I482 Chronic atrial fibrillation, unspecified: Secondary | ICD-10-CM

## 2023-01-27 DIAGNOSIS — E785 Hyperlipidemia, unspecified: Secondary | ICD-10-CM | POA: Diagnosis not present

## 2023-01-27 DIAGNOSIS — I5022 Chronic systolic (congestive) heart failure: Secondary | ICD-10-CM

## 2023-01-27 NOTE — Patient Instructions (Signed)
Medication Instructions:  No changes *If you need a refill on your cardiac medications before your next appointment, please call your pharmacy*   Lab Work: None ordered If you have labs (blood work) drawn today and your tests are completely normal, you will receive your results only by: MyChart Message (if you have MyChart) OR A paper copy in the mail If you have any lab test that is abnormal or we need to change your treatment, we will call you to review the results.   Testing/Procedures: None ordered   Follow-Up: At [facility], you and your health needs are our priority.  As part of our continuing mission to provide you with exceptional heart care, we have created designated Provider Care Teams.  These Care Teams include your primary Cardiologist (physician) and Advanced Practice Providers (APPs -  Physician Assistants and Nurse Practitioners) who all work together to provide you with the care you need, when you need it.  We recommend signing up for the patient portal called "MyChart".  Sign up information is provided on this After Visit Summary.  MyChart is used to connect with patients for Virtual Visits (Telemedicine).  Patients are able to view lab/test results, encounter notes, upcoming appointments, etc.  Non-urgent messages can be sent to your provider as well.   To learn more about what you can do with MyChart, go to [email or web address].    Your next appointment:   6 month(s)  Provider:   You may see Muhammad Arida, MD or one of the following Advanced Practice Providers on your designated Care Team:   Christopher Berge, NP Ryan Dunn, PA-C Cadence Furth, PA-C Sheri Hammock, NP    

## 2023-01-27 NOTE — Progress Notes (Signed)
Cardiology Office Note   Date:  01/27/2023   ID:  Kimberly Henry, Kimberly Henry [birth year], MRN [id number]  PCP:  Alba Cory, MD  Cardiologist:   Lorine Bears, MD   Chief Complaint  Patient presents with   Follow-up    6 month f/u no complaints today. Meds reviewed verbally with pt.       History of Present Illness: Kimberly Henry is a 82 y.o. female who is here today for follow-up visit regarding chronic systolic heart failure due to nonischemic cardiomyopathy and chronic atrial fibrillation. She has known history of essential hypertension, obesity, COPD, hyperlipidemia and osteoarthritis.She is a previous smoker but quit many years ago. She was diagnosed with systolic heart failure in August of 2018.   She was noted to have left bundle branch block.  Cardiac catheterization at that time showed mild nonobstructive coronary artery disease with an EF of 25-30%. Right heart catheterization showed severely elevated filling pressures, severely reduced cardiac output and severe pulmonary hypertension. She was treated with medical therapy and has done well with medications.  She declined CRT at some point given improvement in her symptoms.  Echocardiogram in May 2021 showed an EF of 50 to 55%.  Left atrium was severely dilated.  She was diagnosed with atrial fibrillation around the same time and I felt that the chance of maintaining sinus rhythm was going to be low without an antiarrhythmic medication.    She was hospitalized in November 2023 with dark stool.  EGD showed peptic ulcer disease.  Her anticoagulation was resumed and hemoglobin stabilized.  She has been doing well since then with no chest pain or shortness of breath.  No recurrent bleeding.   Past Medical History:  Diagnosis Date   Acid phosphatase elevated    Aortic atherosclerosis (HCC)    Atrial fibrillation (HCC)    a.) CHA2DS2VASc = 6 (age x 2, sex, HFrEF, HTN, vascular disease history);  b.) rate/rhythm maintained on oral  carvedilol; chronically anticoagulated with apixaban   AV block, 1st degree    Cervical cancer (HCC) 1999   COPD (chronic obstructive pulmonary disease) (HCC)    HFrEF (heart failure with reduced ejection fraction) (HCC)    a.) 04/2017 Echo: EF 30-35%, antsept HK, mild MR, mod dil LA, mildly dil RA, mod TR, mildly to mod elev PASP; b.) 08/2017 Echo: EF 30-35%, diff HK, antsept HK. Gr1 DD. Mild to mod MR. PASP nl; c.) TTE 01/22/2020: EF 50-55%, mild LAE, triv MR; RVSP 38.3   Hx of cervical malignancy    Hyperlipidemia    Hypertension    LBBB (left bundle branch block)    Long term current use of anticoagulant    a.) apixaban   Metabolic syndrome    NICM (nonischemic cardiomyopathy) (HCC)    a.) 04/2017 Echo: EF 30-35%; b.) 04/2017 Cath: nonobs dzs; c.) 08/2017 Echo: EF 30-35%, diff HK, antsept HK. Gr1 DD. Mild to mod MR. PASP nl; d.) TTE 01/22/2020: EF 50-55%, mild LAE, triv MR, RVSP 38.3   Non-obstructive CAD (coronary artery disease)    a. 04/2017 Cath: mild nonobs CAD. EF 25-35%. CO 3.14, CI 1.38. Sev PAH [61/34(47].   Obesity, Class III, BMI 40-49.9 (morbid obesity) (HCC)    Osteoarthritis of both knees    PAH (pulmonary arterial hypertension) with portal hypertension (HCC)    a.) 04/2017 Right Heart Cath: Sev PAH 61/34(47); b.) 08/2017 Echo: nl PASP; c.) TTE 01/22/2020: RVSP 38.3    Past Surgical History:  Procedure Laterality  Date   ABDOMINAL HYSTERECTOMY  1996   CARDIAC CATHETERIZATION  2018   d/t CHF   CATARACT EXTRACTION Left 2008   CATARACT EXTRACTION W/PHACO Right 03/11/2021   Procedure: CATARACT EXTRACTION PHACO AND INTRAOCULAR LENS PLACEMENT (IOC) RIGHT;  Surgeon: Lockie Mola, MD;  Location: [facility];  Service: Ophthalmology;  Laterality: Right;  7.70 01:31.1   ESOPHAGOGASTRODUODENOSCOPY (EGD) WITH PROPOFOL N/A 08/04/2022   Procedure: ESOPHAGOGASTRODUODENOSCOPY (EGD) WITH PROPOFOL;  Surgeon: Regis Bill, MD;  Location: [facility];  Service:  Endoscopy;  Laterality: N/A;   JOINT REPLACEMENT Right 06/2020   KNEE CLOSED REDUCTION Right 10/29/2020   Procedure: CLOSED MANIPULATION KNEE;  Surgeon: Lyndle Herrlich, MD;  Location: [facility];  Service: Orthopedics;  Laterality: Right;   RIGHT/LEFT HEART CATH AND CORONARY ANGIOGRAPHY N/A 04/21/2017   Procedure: RIGHT/LEFT HEART CATH AND CORONARY ANGIOGRAPHY;  Surgeon: Iran Ouch, MD;  Location: [facility];  Service: Cardiovascular;  Laterality: N/A;   TOTAL KNEE ARTHROPLASTY Right 07/02/2020   Procedure: TOTAL KNEE ARTHROPLASTY;  Surgeon: Lyndle Herrlich, MD;  Location: [facility];  Service: Orthopedics;  Laterality: Right;   TOTAL KNEE ARTHROPLASTY Left 06/08/2022   Procedure: TOTAL KNEE ARTHROPLASTY;  Surgeon: Lyndle Herrlich, MD;  Location: [facility];  Service: Orthopedics;  Laterality: Left;     Current Outpatient Medications  Medication Sig Dispense Refill   acetaminophen (TYLENOL) 325 MG tablet Take 650 mg by mouth every 6 (six) hours as needed for mild pain or moderate pain.     carvedilol (COREG) 6.25 MG tablet TAKE 1 TABLET BY MOUTH TWICE  DAILY WITH MEALS 180 tablet 0   dapagliflozin propanediol (FARXIGA) 10 MG TABS tablet Take 1 tablet (10 mg total) by mouth daily before breakfast. 90 tablet 3   ELIQUIS 5 MG TABS tablet TAKE 1 TABLET BY MOUTH TWICE  DAILY 200 tablet 2   levocetirizine (XYZAL) 5 MG tablet TAKE 1 TABLET BY MOUTH IN THE  EVENING 60 tablet 0   Multiple Vitamin (MULTIVITAMIN WITH MINERALS) TABS tablet Take 1 tablet by mouth daily.     pantoprazole (PROTONIX) 40 MG tablet Take 1 tablet (40 mg total) by mouth 2 (two) times daily before a meal. 60 tablet 2   potassium chloride SA (KLOR-CON M) 20 MEQ tablet TAKE 1 TABLET BY MOUTH DAILY 80 tablet 3   rosuvastatin (CRESTOR) 40 MG tablet TAKE 1 TABLET BY MOUTH DAILY 90 tablet 0   sacubitril-valsartan (ENTRESTO) 97-103 MG TAKE 1 TABLET BY MOUTH TWICE  DAILY 180 tablet 0   spironolactone (ALDACTONE) 25 MG tablet  TAKE 1 TABLET BY MOUTH DAILY 90 tablet 0   No current facility-administered medications for this visit.    Allergies:   Patient has no known allergies.    Social History:  The patient  reports that she quit smoking about 36 years ago. Her smoking use included cigarettes. She started smoking about 47 years ago. She has never used smokeless tobacco. She reports that she does not drink alcohol and does not use drugs.   Family History:  The patient's family history includes Congestive Heart Failure in her father and mother; Hypertension in her mother; Prostate cancer in her son.    ROS:  Please see the history of present illness.   Otherwise, review of systems are positive for none.   All other systems are reviewed and negative.    PHYSICAL EXAM: VS:  BP 124/60 (BP Location: Left Arm, Patient Position: Sitting, Cuff Size: Large)  Pulse 64   Ht 5\' 5"  (1.651 m)   Wt 267 lb 6 oz (121.3 kg)   SpO2 99%   BMI 44.49 kg/m  , BMI Body mass index is 44.49 kg/m. GEN: Well nourished, well developed, in no acute distress  HEENT: normal  Neck: no carotid bruits, or masses. No significant JVD. Cardiac: Irregularly irregular; no murmurs, rubs, or gallops, no edema Respiratory: Clear to auscultation bilaterally, normal work of breathing GI: soft, nontender, nondistended, + BS MS: no deformity or atrophy  Skin: warm and dry, no rash Neuro:  Strength and sensation are intact Psych: euthymic mood, full affect   EKG:  EKG is ordered today. EKG showed atrial fibrillation with ventricular rate of 64 bpm.  Left bundle branch block.   Recent Labs: 08/02/2022: B Natriuretic Peptide 740.4 08/04/2022: Magnesium 2.1 08/26/2022: ALT 11; BUN 11; Creat 1.06; Hemoglobin 10.9; Platelets 264; Potassium 4.1; Sodium 144    Lipid Panel    Component Value Date/Time   CHOL 133 08/26/2022 0925   CHOL 139 12/03/2021 0838   TRIG 77 08/26/2022 0925   HDL 49 (L) 08/26/2022 0925   HDL 47 12/03/2021 0838    CHOLHDL 2.7 08/26/2022 0925   VLDL 22 03/02/2017 1242   LDLCALC 68 08/26/2022 0925      Wt Readings from Last 3 Encounters:  01/27/23 267 lb 6 oz (121.3 kg)  10/01/22 277 lb (125.6 kg)  08/26/22 275 lb 14.4 oz (125.1 kg)           No data to display            ASSESSMENT AND PLAN:  1.  Chronic systolic heart failure: Due to nonischemic cardiomyopathy.  Chronically in [place] Heart Association class II .  She appears to be euvolemic without any loop diuretics. Most recent echocardiogram in November 2023 showed an EF of 45 to 50% with mild mitral regurgitation. Continue treatment with carvedilol, Entresto, Farxiga and spironolactone.    2. Essential hypertension: Blood pressure is well controlled on current medications.  3. Hyperlipidemia: Continue rosuvastatin.  Most recent lipid profile showed an LDL of 68.  4.  Chronic atrial fibrillation: Ventricular rate is controlled with carvedilol and she is tolerating anticoagulation with Eliquis.  No recurrent GI bleed since her hospitalization in November.     Disposition:   FU in 6 months  Signed,  Lorine Bears, MD  01/27/2023 8:23 AM    [facility]

## 2023-02-18 ENCOUNTER — Ambulatory Visit (INDEPENDENT_AMBULATORY_CARE_PROVIDER_SITE_OTHER): Payer: Medicare Other

## 2023-02-18 VITALS — Ht 67.0 in | Wt 267.0 lb

## 2023-02-18 DIAGNOSIS — Z0000 Encounter for general adult medical examination without abnormal findings: Secondary | ICD-10-CM

## 2023-02-18 NOTE — Patient Instructions (Signed)
Kimberly Henry , Thank you for taking time to come for your Medicare Wellness Visit. I appreciate your ongoing commitment to your health goals. Please review the following plan we discussed and let me know if I can assist you in the future.   These are the goals we discussed:  Goals      Increase physical activity     Pt would like to increase physical activity after knee replacement this year.         This is a list of the screening recommended for you and due dates:  Health Maintenance  Topic Date Due   Zoster (Shingles) Vaccine (2 of 2) 09/17/2014   COVID-19 Vaccine (5 - 2023-24 season) 05/14/2022   Flu Shot  04/14/2023   Medicare Annual Wellness Visit  02/18/2024   Pneumonia Vaccine  Completed   DEXA scan (bone density measurement)  Completed   HPV Vaccine  Aged Out   DTaP/Tdap/Td vaccine  Discontinued    Advanced directives: yes  Conditions/risks identified: low falls risk  Next appointment: Follow up in one year for your annual wellness visit: 02/24/2024 @9 :15am telephone  Preventive Care 65 Years and Older, Female  Preventive care refers to lifestyle choices and visits with your health care provider that can promote health and wellness. What does preventive care include? A yearly physical exam. This is also called an annual well check. Dental exams once or twice a year. Routine eye exams. Ask your health care provider how often you should have your eyes checked. Personal lifestyle choices, including: Daily care of your teeth and gums. Regular physical activity. Eating a healthy diet. Avoiding tobacco and drug use. Limiting alcohol use. Practicing safe sex. Taking low doses of aspirin every day. Taking vitamin and mineral supplements as recommended by your health care provider. What happens during an annual well check? The services and screenings done by your health care provider during your annual well check will depend on your age, overall health, lifestyle risk  factors, and family history of disease. Counseling  Your health care provider may ask you questions about your: Alcohol use. Tobacco use. Drug use. Emotional well-being. Home and relationship well-being. Sexual activity. Eating habits. History of falls. Memory and ability to understand (cognition). Work and work Astronomer. Screening  You may have the following tests or measurements: Height, weight, and BMI. Blood pressure. Lipid and cholesterol levels. These may be checked every 5 years, or more frequently if you are over 81 years old. Skin check. Lung cancer screening. You may have this screening every year starting at age 69 if you have a 30-pack-year history of smoking and currently smoke or have quit within the past 15 years. Fecal occult blood test (FOBT) of the stool. You may have this test every year starting at age 96. Flexible sigmoidoscopy or colonoscopy. You may have a sigmoidoscopy every 5 years or a colonoscopy every 10 years starting at age 30. Prostate cancer screening. Recommendations will vary depending on your family history and other risks. Hepatitis C blood test. Hepatitis B blood test. Sexually transmitted disease (STD) testing. Diabetes screening. This is done by checking your blood sugar (glucose) after you have not eaten for a while (fasting). You may have this done every 1-3 years. Abdominal aortic aneurysm (AAA) screening. You may need this if you are a current or former smoker. Osteoporosis. You may be screened starting at age 103 if you are at high risk. Talk with your health care provider about your test results, treatment options,  and if necessary, the need for more tests. Vaccines  Your health care provider may recommend certain vaccines, such as: Influenza vaccine. This is recommended every year. Tetanus, diphtheria, and acellular pertussis (Tdap, Td) vaccine. You may need a Td booster every 10 years. Zoster vaccine. You may need this after age  23. Pneumococcal 13-valent conjugate (PCV13) vaccine. One dose is recommended after age 84. Pneumococcal polysaccharide (PPSV23) vaccine. One dose is recommended after age 25. Talk to your health care provider about which screenings and vaccines you need and how often you need them. This information is not intended to replace advice given to you by your health care provider. Make sure you discuss any questions you have with your health care provider. Document Released: 09/26/2015 Document Revised: 05/19/2016 Document Reviewed: 07/01/2015 Elsevier Interactive Patient Education  2017 [place] Prevention in the Home Falls can cause injuries. They can happen to people of all ages. There are many things you can do to make your home safe and to help prevent falls. What can I do on the outside of my home? Regularly fix the edges of walkways and driveways and fix any cracks. Remove anything that might make you trip as you walk through a door, such as a raised step or threshold. Trim any bushes or trees on the path to your home. Use bright outdoor lighting. Clear any walking paths of anything that might make someone trip, such as rocks or tools. Regularly check to see if handrails are loose or broken. Make sure that both sides of any steps have handrails. Any raised decks and porches should have guardrails on the edges. Have any leaves, snow, or ice cleared regularly. Use sand or salt on walking paths during winter. Clean up any spills in your garage right away. This includes oil or grease spills. What can I do in the bathroom? Use night lights. Install grab bars by the toilet and in the tub and shower. Do not use towel bars as grab bars. Use non-skid mats or decals in the tub or shower. If you need to sit down in the shower, use a plastic, non-slip stool. Keep the floor dry. Clean up any water that spills on the floor as soon as it happens. Remove soap buildup in the tub or shower  regularly. Attach bath mats securely with double-sided non-slip rug tape. Do not have throw rugs and other things on the floor that can make you trip. What can I do in the bedroom? Use night lights. Make sure that you have a light by your bed that is easy to reach. Do not use any sheets or blankets that are too big for your bed. They should not hang down onto the floor. Have a firm chair that has side arms. You can use this for support while you get dressed. Do not have throw rugs and other things on the floor that can make you trip. What can I do in the kitchen? Clean up any spills right away. Avoid walking on wet floors. Keep items that you use a lot in easy-to-reach places. If you need to reach something above you, use a strong step stool that has a grab bar. Keep electrical cords out of the way. Do not use floor polish or wax that makes floors slippery. If you must use wax, use non-skid floor wax. Do not have throw rugs and other things on the floor that can make you trip. What can I do with my stairs? Do not leave  any items on the stairs. Make sure that there are handrails on both sides of the stairs and use them. Fix handrails that are broken or loose. Make sure that handrails are as long as the stairways. Check any carpeting to make sure that it is firmly attached to the stairs. Fix any carpet that is loose or worn. Avoid having throw rugs at the top or bottom of the stairs. If you do have throw rugs, attach them to the floor with carpet tape. Make sure that you have a light switch at the top of the stairs and the bottom of the stairs. If you do not have them, ask someone to add them for you. What else can I do to help prevent falls? Wear shoes that: Do not have high heels. Have rubber bottoms. Are comfortable and fit you well. Are closed at the toe. Do not wear sandals. If you use a stepladder: Make sure that it is fully opened. Do not climb a closed stepladder. Make sure that  both sides of the stepladder are locked into place. Ask someone to hold it for you, if possible. Clearly mark and make sure that you can see: Any grab bars or handrails. First and last steps. Where the edge of each step is. Use tools that help you move around (mobility aids) if they are needed. These include: Canes. Walkers. Scooters. Crutches. Turn on the lights when you go into a dark area. Replace any light bulbs as soon as they burn out. Set up your furniture so you have a clear path. Avoid moving your furniture around. If any of your floors are uneven, fix them. If there are any pets around you, be aware of where they are. Review your medicines with your doctor. Some medicines can make you feel dizzy. This can increase your chance of falling. Ask your doctor what other things that you can do to help prevent falls. This information is not intended to replace advice given to you by your health care provider. Make sure you discuss any questions you have with your health care provider. Document Released: 06/26/2009 Document Revised: 02/05/2016 Document Reviewed: 10/04/2014 Elsevier Interactive Patient Education  2017 [employer].

## 2023-02-18 NOTE — Progress Notes (Signed)
I connected with  Kimberly Henry on 02/18/23 by a audio enabled telemedicine application and verified that I am speaking with the correct person using two identifiers.  Patient Location: Home  Provider Location: Office/Clinic  I discussed the limitations of evaluation and management by telemedicine. The patient expressed understanding and agreed to proceed.  Subjective:   Kimberly Henry is a 82 y.o. female who presents for Medicare Annual (Subsequent) preventive examination.  Review of Systems    Cardiac Risk Factors include: advanced age (>87men, >75 women);hypertension;obesity (BMI >30kg/m2);dyslipidemia    Objective:    Today's Vitals   02/18/23 0959  Weight: 267 lb (121.1 kg)  Height: 5\' 7"  (1.702 m)   Body mass index is 41.82 kg/m.     02/18/2023   10:06 AM 08/02/2022   11:29 PM 06/08/2022   10:18 AM 05/27/2022   10:03 AM 05/28/2021   10:10 AM 10/29/2020    1:56 PM 10/23/2020    3:59 PM  Advanced Directives  Does Patient Have a Medical Advance Directive? Yes Yes Yes No No No No  Type of Estate agent of [place];Living will Living will Healthcare Power of Attorney      Does patient want to make changes to medical advance directive?  No - Patient declined No - Patient declined      Copy of Healthcare Power of Attorney in Chart?   No - copy requested      Would patient like information on creating a medical advance directive?     Yes (MAU/Ambulatory/Procedural Areas - Information given) No - Patient declined No - Patient declined    Current Medications (verified) Outpatient Encounter Medications as of 02/18/2023  Medication Sig   acetaminophen (TYLENOL) 325 MG tablet Take 650 mg by mouth every 6 (six) hours as needed for mild pain or moderate pain.   carvedilol (COREG) 6.25 MG tablet TAKE 1 TABLET BY MOUTH TWICE  DAILY WITH MEALS   dapagliflozin propanediol (FARXIGA) 10 MG TABS tablet Take 1 tablet (10 mg total) by mouth daily before breakfast.    ELIQUIS 5 MG TABS tablet TAKE 1 TABLET BY MOUTH TWICE  DAILY   levocetirizine (XYZAL) 5 MG tablet TAKE 1 TABLET BY MOUTH IN THE  EVENING   Multiple Vitamin (MULTIVITAMIN WITH MINERALS) TABS tablet Take 1 tablet by mouth daily.   potassium chloride SA (KLOR-CON M) 20 MEQ tablet TAKE 1 TABLET BY MOUTH DAILY   rosuvastatin (CRESTOR) 40 MG tablet TAKE 1 TABLET BY MOUTH DAILY   sacubitril-valsartan (ENTRESTO) 97-103 MG TAKE 1 TABLET BY MOUTH TWICE  DAILY   spironolactone (ALDACTONE) 25 MG tablet TAKE 1 TABLET BY MOUTH DAILY   pantoprazole (PROTONIX) 40 MG tablet Take 1 tablet (40 mg total) by mouth 2 (two) times daily before a meal.   No facility-administered encounter medications on file as of 02/18/2023.    Allergies (verified) Patient has no known allergies.   History: Past Medical History:  Diagnosis Date   Acid phosphatase elevated    Aortic atherosclerosis (HCC)    Atrial fibrillation (HCC)    a.) CHA2DS2VASc = 6 (age x 2, sex, HFrEF, HTN, vascular disease history);  b.) rate/rhythm maintained on oral carvedilol; chronically anticoagulated with apixaban   AV block, 1st degree    Cervical cancer (HCC) 1999   COPD (chronic obstructive pulmonary disease) (HCC)    HFrEF (heart failure with reduced ejection fraction) (HCC)    a.) 04/2017 Echo: EF 30-35%, antsept HK, mild MR, mod dil LA, mildly  dil RA, mod TR, mildly to mod elev PASP; b.) 08/2017 Echo: EF 30-35%, diff HK, antsept HK. Gr1 DD. Mild to mod MR. PASP nl; c.) TTE 01/22/2020: EF 50-55%, mild LAE, triv MR; RVSP 38.3   Hx of cervical malignancy    Hyperlipidemia    Hypertension    LBBB (left bundle branch block)    Long term current use of anticoagulant    a.) apixaban   Metabolic syndrome    NICM (nonischemic cardiomyopathy) (HCC)    a.) 04/2017 Echo: EF 30-35%; b.) 04/2017 Cath: nonobs dzs; c.) 08/2017 Echo: EF 30-35%, diff HK, antsept HK. Gr1 DD. Mild to mod MR. PASP nl; d.) TTE 01/22/2020: EF 50-55%, mild LAE, triv MR, RVSP 38.3    Non-obstructive CAD (coronary artery disease)    a. 04/2017 Cath: mild nonobs CAD. EF 25-35%. CO 3.14, CI 1.38. Sev PAH [61/34(47].   Obesity, Class III, BMI 40-49.9 (morbid obesity) (HCC)    Osteoarthritis of both knees    PAH (pulmonary arterial hypertension) with portal hypertension (HCC)    a.) 04/2017 Right Heart Cath: Sev PAH 61/34(47); b.) 08/2017 Echo: nl PASP; c.) TTE 01/22/2020: RVSP 38.3   Past Surgical History:  Procedure Laterality Date   ABDOMINAL HYSTERECTOMY  1996   CARDIAC CATHETERIZATION  2018   d/t CHF   CATARACT EXTRACTION Left 2008   CATARACT EXTRACTION W/PHACO Right 03/11/2021   Procedure: CATARACT EXTRACTION PHACO AND INTRAOCULAR LENS PLACEMENT (IOC) RIGHT;  Surgeon: Lockie Mola, MD;  Location: [facility];  Service: Ophthalmology;  Laterality: Right;  7.70 01:31.1   ESOPHAGOGASTRODUODENOSCOPY (EGD) WITH PROPOFOL N/A 08/04/2022   Procedure: ESOPHAGOGASTRODUODENOSCOPY (EGD) WITH PROPOFOL;  Surgeon: Regis Bill, MD;  Location: [facility];  Service: Endoscopy;  Laterality: N/A;   JOINT REPLACEMENT Right 06/2020   KNEE CLOSED REDUCTION Right 10/29/2020   Procedure: CLOSED MANIPULATION KNEE;  Surgeon: Lyndle Herrlich, MD;  Location: [facility];  Service: Orthopedics;  Laterality: Right;   RIGHT/LEFT HEART CATH AND CORONARY ANGIOGRAPHY N/A 04/21/2017   Procedure: RIGHT/LEFT HEART CATH AND CORONARY ANGIOGRAPHY;  Surgeon: Iran Ouch, MD;  Location: [facility];  Service: Cardiovascular;  Laterality: N/A;   TOTAL KNEE ARTHROPLASTY Right 07/02/2020   Procedure: TOTAL KNEE ARTHROPLASTY;  Surgeon: Lyndle Herrlich, MD;  Location: [facility];  Service: Orthopedics;  Laterality: Right;   TOTAL KNEE ARTHROPLASTY Left 06/08/2022   Procedure: TOTAL KNEE ARTHROPLASTY;  Surgeon: Lyndle Herrlich, MD;  Location: [facility];  Service: Orthopedics;  Laterality: Left;   Family History  Problem Relation Age of Onset   Hypertension Mother    Congestive  Heart Failure Mother    Congestive Heart Failure Father    Prostate cancer Son    Social History   Socioeconomic History   Marital status: Widowed    Spouse name: Not on file   Number of children: 4   Years of education: 57   Highest education level: 12th grade  Occupational History   Occupation: helps disabled persons    Comment: part time  Tobacco Use   Smoking status: Former    Years: 10    Types: Cigarettes    Start date: 09/14/1975    Quit date: 06/22/1986    Years since quitting: 36.6   Smokeless tobacco: Never  Vaping Use   Vaping Use: Never used  Substance and Sexual Activity   Alcohol use: No    Alcohol/week: 0.0 standard drinks of alcohol   Drug use: No   Sexual activity:  Not Currently  Other Topics Concern   Not on file  Social History Narrative   Patient has 4 grown children-1 of which is deceased, 35 grandchildren & 5 great-grandchildren      Patient lives with her grandson.    Social Determinants of Health   Financial Resource Strain: Low Risk  (02/18/2023)   Overall Financial Resource Strain (CARDIA)    Difficulty of Paying Living Expenses: Not hard at all  Food Insecurity: No Food Insecurity (02/18/2023)   Hunger Vital Sign    Worried About Running Out of Food in the Last Year: Never true    Ran Out of Food in the Last Year: Never true  Transportation Needs: No Transportation Needs (02/18/2023)   PRAPARE - Administrator, Civil Service (Medical): No    Lack of Transportation (Non-Medical): No  Physical Activity: Insufficiently Active (02/18/2023)   Exercise Vital Sign    Days of Exercise per Week: 4 days    Minutes of Exercise per Session: 30 min  Stress: No Stress Concern Present (02/18/2023)   [employer] of Occupational Health - Occupational Stress Questionnaire    Feeling of Stress : Not at all  Social Connections: Moderately Isolated (02/18/2023)   Social Connection and Isolation Panel [NHANES]    Frequency of Communication with  Friends and Family: More than three times a week    Frequency of Social Gatherings with Friends and Family: Three times a week    Attends Religious Services: More than 4 times per year    Active Member of Clubs or Organizations: No    Attends Banker Meetings: Never    Marital Status: Widowed    Tobacco Counseling Counseling given: Not Answered   Clinical Intake:  Pre-visit preparation completed: Yes  Pain : No/denies pain     BMI - recorded: 41.82 Nutritional Status: BMI > 30  Obese Nutritional Risks: None Diabetes: No  How often do you need to have someone help you when you read instructions, pamphlets, or other written materials from your doctor or pharmacy?: 1 - Never  Diabetic?no  Interpreter Needed?: No  Comments: grandson lives with pt Information entered by :: B.Caliber Landess,LPN   Activities of Daily Living    02/18/2023   10:07 AM 08/26/2022    8:31 AM  In your present state of health, do you have any difficulty performing the following activities:  Hearing? 0 0  Vision? 0 0  Difficulty concentrating or making decisions? 0 0  Walking or climbing stairs? 0 1  Dressing or bathing? 0 0  Doing errands, shopping? 0 0  Preparing Food and eating ? N   Using the Toilet? N   In the past six months, have you accidently leaked urine? N   Do you have problems with loss of bowel control? N   Managing your Medications? N   Managing your Finances? N   Housekeeping or managing your Housekeeping? N     Patient Care Team: Alba Cory, MD as PCP - General (Family Medicine) Iran Ouch, MD as PCP - Cardiology (Cardiology) Delma Freeze, FNP as Nurse Practitioner (Family Medicine) Iran Ouch, MD as Consulting Physician (Cardiology)  Indicate any recent Medical Services you may have received from other than [facility] providers in the past year (date may be approximate).     Assessment:   This is a routine wellness examination for  [facility].  Hearing/Vision screen Hearing Screening - Comments:: Adequate hearing Vision Screening - Comments:: Adequate vision w/glasses  [facility]  Dietary issues and exercise activities discussed: Current Exercise Habits: The patient has a physically strenuous job, but has no regular exercise apart from work., Exercise limited by: orthopedic condition(s)   Goals Addressed             This Visit's Progress    Increase physical activity   On track    Pt would like to increase physical activity after knee replacement this year.        Depression Screen    02/18/2023   10:04 AM 08/26/2022    8:31 AM 08/02/2022    2:12 PM 02/26/2022    8:53 AM 07/09/2021    8:56 AM 06/29/2021    2:29 PM 05/28/2021   10:08 AM  PHQ 2/9 Scores  PHQ - 2 Score 0 0 0 0 0 0 0  PHQ- 9 Score  0 0 3 0 0     Fall Risk    02/18/2023   10:02 AM 08/26/2022    8:31 AM 08/02/2022    2:12 PM 02/26/2022    8:53 AM 07/09/2021    8:56 AM  Fall Risk   Falls in the past year? 0 0 0 0 0  Number falls in past yr: 0  0 0   Injury with Fall? 0  0 0   Risk for fall due to : No Fall Risks No Fall Risks No Fall Risks Impaired balance/gait   Follow up Education provided;Falls prevention discussed Falls prevention discussed;Education provided;Falls evaluation completed Falls prevention discussed;Education provided;Falls evaluation completed Falls prevention discussed Falls prevention discussed    FALL RISK PREVENTION PERTAINING TO THE HOME:  Any stairs in or around the home? Yes  If so, are there any without handrails? Yes  Home free of loose throw rugs in walkways, pet beds, electrical cords, etc? Yes  Adequate lighting in your home to reduce risk of falls? Yes   ASSISTIVE DEVICES UTILIZED TO PREVENT FALLS:  Life alert? No  Use of a cane, walker or w/c? No  Grab bars in the bathroom? Yes  Shower chair or bench in shower? Yes  Elevated toilet seat or a handicapped toilet? No   Cognitive  Function:        02/18/2023   10:11 AM 05/22/2020    3:21 PM 11/07/2018   10:12 AM  6CIT Screen  What Year? 0 points 0 points 0 points  What month? 0 points 0 points 0 points  What time? 0 points 0 points 0 points  Count back from 20 0 points 0 points 0 points  Months in reverse 0 points 0 points 0 points  Repeat phrase 0 points 2 points 4 points  Total Score 0 points 2 points 4 points    Immunizations Immunization History  Administered Date(s) Administered   Fluad Quad(high Dose 65+) 07/09/2021, 08/04/2022   Influenza, High Dose Seasonal PF 06/22/2016, 07/18/2018, 05/15/2019   Influenza, Seasonal, Injecte, Preservative Fre 07/23/2014   Influenza,inj,Quad PF,6+ Mos 05/30/2015, 07/01/2017   Influenza-Unspecified 07/10/2013   Moderna Sars-Covid-2 Vaccination 09/13/2019, 10/12/2019, 08/15/2020, 12/15/2020   Pneumococcal Conjugate-13 05/30/2015   Pneumococcal Polysaccharide-23 05/02/2012   Tdap 05/02/2012   Zoster Recombinat (Shingrix) 07/23/2014   Zoster, Live 07/23/2014    TDAP status: Up to date  Flu Vaccine status: Up to date  Pneumococcal vaccine status: Up to date  Covid-19 vaccine status: Completed vaccines  Qualifies for Shingles Vaccine? Yes   Zostavax completed Yes   Shingrix Completed?: Yes  Screening Tests Health Maintenance  Topic  Date Due   Zoster Vaccines- Shingrix (2 of 2) 09/17/2014   COVID-19 Vaccine (5 - 2023-24 season) 05/14/2022   INFLUENZA VACCINE  04/14/2023   Medicare Annual Wellness (AWV)  02/18/2024   Pneumonia Vaccine 107+ Years old  Completed   DEXA SCAN  Completed   HPV VACCINES  Aged Out   DTaP/Tdap/Td  Discontinued    Health Maintenance  Health Maintenance Due  Topic Date Due   Zoster Vaccines- Shingrix (2 of 2) 09/17/2014   COVID-19 Vaccine (5 - 2023-24 season) 05/14/2022    Colorectal cancer screening: No longer required.   Mammogram status: No longer required due to age.   Lung Cancer Screening: (Low Dose CT Chest  recommended if Age 16-80 years, 30 pack-year currently smoking OR have quit w/in 15years.) does not qualify.   Lung Cancer Screening Referral: no  Additional Screening:  Hepatitis C Screening: does not qualify; Completed yes  Vision Screening: Recommended annual ophthalmology exams for early detection of glaucoma and other disorders of the eye. Is the patient up to date with their annual eye exam?  Yes  Who is the provider or what is the name of the office in which the patient attends annual eye exams? Dr Dellie Burns If pt is not established with a provider, would they like to be referred to a provider to establish care? No .   Dental Screening: Recommended annual dental exams for proper oral hygiene  Community Resource Referral / Chronic Care Management: CRR required this visit?  No   CCM required this visit?  No      Plan:     I have personally reviewed and noted the following in the patient's chart:   Medical and social history Use of alcohol, tobacco or illicit drugs  Current medications and supplements including opioid prescriptions. Patient is not currently taking opioid prescriptions. Functional ability and status Nutritional status Physical activity Advanced directives List of other physicians Hospitalizations, surgeries, and ER visits in previous 12 months Vitals Screenings to include cognitive, depression, and falls Referrals and appointments  In addition, I have reviewed and discussed with patient certain preventive protocols, quality metrics, and best practice recommendations. A written personalized care plan for preventive services as well as general preventive health recommendations were provided to patient.     Sue Lush, LPN   09/18/1094   Nurse Notes: The patient states she is doing well and has no concerns or questions at this time.

## 2023-03-04 ENCOUNTER — Ambulatory Visit: Payer: Medicare Other | Admitting: Family Medicine

## 2023-03-23 NOTE — Progress Notes (Deleted)
Name: Kimberly Henry   MRN: [id number]    DOB: 1940-09-29   Date:03/23/2023       Progress Note  Subjective  Chief Complaint  Follow Up  HPI  CHF: diagnosed in 04/2017, secondary to non ischemic cardiomyopathy.  She is on beta-blocker, spironolactone, Entresto and Farxiga, she was recently admitted with acute on chronic CHF, discharge 11/22 and is feeling back to baseline . Denies orthopnea, lower extremity swelling is down. She had a cath done in 2018 showed severe pulmonary hypertension   CT angiography chest with contrast doe 07/2022 - during hospital stay   1. Negative examination for pulmonary embolism. 2. Diffuse bilateral bronchial wall thickening, interlobular septal thickening, and trace bilateral pleural effusions, consistent with pulmonary edema. 3. Gross cardiomegaly and coronary artery disease. 4. Enlargement of the main pulmonary artery, as can be seen in pulmonary hypertension.   Aortic Atherosclerosis (ICD10-I70.0).  Atrial Fibrilation: on Eliquis and is rate controlled with carvedilol, no side effects, denies blood in stools or gum. She was recently diagnosed with bleeding gastric ulcer, she was off eliquis for 3 days after EGD but is back on it. Denies palpitation  Study Conclusions / ECHO 01/2020  1. Left ventricular ejection fraction, by estimation, is 50 to 55%. Left ventricular ejection fraction by PLAX is 50 %. The left ventricle has low normal function. The left ventricle has no regional wall motion abnormalities. Left ventricular diastolic parameters were normal. 2. Right ventricular systolic function is normal. The right ventricular size is normal. There is mildly elevated pulmonary artery systolic pressure. 3. Left atrial size was mildly dilated. 4. The mitral valve is grossly normal. Trivial mitral valve regurgitation. 5. The aortic valve is grossly normal. Aortic valve regurgitation is not visualized. No aortic stenosis is present   Cardiac cath  04/21/2017  1. Mild nonobstructive coronary artery disease. 2. Moderately to severely reduced LV systolic function with an EF of 25-35%. 3. Right heart catheterization showed moderately elevated filling pressures, severe pulmonary hypertension and severely reduced cardiac output at 3.14 with a cardiac index of 1.38. Pulmonary Wedge pressure was 27 mmHg and PA pressure was 61/34 with a mean of 47 mmHg   HTN: she has been taking medication daily, no chest pain or palpitation, denies dizziness. She has SOB with activity but is back to baseline    OA: she has a long history of OA of both knees. She had total right  knee replacement done by Dr. Odis Luster 06/2020 and had to go back for intra operative manipulation 10/2020, she had left knee replacement 05/2022, she is doing much better, able to walk, no pain just stiffness intermittently   Perennial allergic rhinitis: she states symptoms controlled with medication denies rhinorrhea, nasal congestion or sneezing.  We will send refill   Chronic bronchitis: she smoked about 1 pack a day for about 20 years but quit many years ago, she states she has been doing well, no cough, wheezing and has stable  SOB with moderate activity  that is likely multifactorial.  Morbid Obesity: weight is finally trending down again, it was 282 lbs when she saw me in June and is down to 275.9 lbs. She is trying to eat healthier and avoiding salt     Atherosclerosis of aorta: on statin therapy and bp is controlled, LDL goal is below 70, we will recheck it today   Dyslipidemia: she is now on Rosuvastatin 40 mg and last LDL was 77, discussed adding Zetia but she said no last visit,  we will recheck labs today   CKI stage 3a: we will recheck level, she was admitted and had acute on chronic renal failure , we will recheck GFR. She is now on farxiga also takes ARB in the form of Entresto   Gastric ulcer: she went to [facility] with sob and previous to admission also had melena. She had EGD  and showed an ulcer, taking PPI , recommended to take it for 2 months. She denies any blood in stools or epigastric pain   Patient Active Problem List   Diagnosis Date Noted   Pulmonary hypertension (HCC) 08/26/2022   Hyperkalemia 08/03/2022   Melena 08/03/2022   Acute on chronic diastolic CHF (congestive heart failure) (HCC) 08/02/2022   S/P TKR (total knee replacement) using cement, left 06/08/2022   Status post knee replacement 06/08/2022   Stage 3a chronic kidney disease (HCC) 02/26/2022   Arthritis of left knee 06/09/2021   S/P TKR (total knee replacement) using cement, right 07/02/2020   Simple chronic bronchitis (HCC)    CAD (coronary artery disease)    Chronic a-fib (HCC)    Bradycardia    Lung nodule seen on imaging study 01/09/2020   NICM (nonischemic cardiomyopathy) (HCC) 01/09/2020   Morbid obesity (HCC) 07/18/2018   Chronic systolic heart failure (HCC) 04/28/2017   AKI (acute kidney injury) (HCC) 04/19/2017   Aortic atherosclerosis (HCC) 03/10/2017   Perennial allergic rhinitis with seasonal variation 05/30/2015   1st degree AV block 03/25/2015   Metabolic syndrome 03/03/2015   History of cervical cancer 03/03/2015   Hypertension, benign 03/03/2015   Osteoarthritis, knee 03/03/2015   Dyslipidemia 03/03/2015    Past Surgical History:  Procedure Laterality Date   ABDOMINAL HYSTERECTOMY  1996   CARDIAC CATHETERIZATION  2018   d/t CHF   CATARACT EXTRACTION Left 2008   CATARACT EXTRACTION W/PHACO Right 03/11/2021   Procedure: CATARACT EXTRACTION PHACO AND INTRAOCULAR LENS PLACEMENT (IOC) RIGHT;  Surgeon: Lockie Mola, MD;  Location: [facility];  Service: Ophthalmology;  Laterality: Right;  7.70 01:31.1   ESOPHAGOGASTRODUODENOSCOPY (EGD) WITH PROPOFOL N/A 08/04/2022   Procedure: ESOPHAGOGASTRODUODENOSCOPY (EGD) WITH PROPOFOL;  Surgeon: Regis Bill, MD;  Location: [facility];  Service: Endoscopy;  Laterality: N/A;   JOINT REPLACEMENT Right  06/2020   KNEE CLOSED REDUCTION Right 10/29/2020   Procedure: CLOSED MANIPULATION KNEE;  Surgeon: Lyndle Herrlich, MD;  Location: [facility];  Service: Orthopedics;  Laterality: Right;   RIGHT/LEFT HEART CATH AND CORONARY ANGIOGRAPHY N/A 04/21/2017   Procedure: RIGHT/LEFT HEART CATH AND CORONARY ANGIOGRAPHY;  Surgeon: Iran Ouch, MD;  Location: [facility];  Service: Cardiovascular;  Laterality: N/A;   TOTAL KNEE ARTHROPLASTY Right 07/02/2020   Procedure: TOTAL KNEE ARTHROPLASTY;  Surgeon: Lyndle Herrlich, MD;  Location: [facility];  Service: Orthopedics;  Laterality: Right;   TOTAL KNEE ARTHROPLASTY Left 06/08/2022   Procedure: TOTAL KNEE ARTHROPLASTY;  Surgeon: Lyndle Herrlich, MD;  Location: [facility];  Service: Orthopedics;  Laterality: Left;    Family History  Problem Relation Age of Onset   Hypertension Mother    Congestive Heart Failure Mother    Congestive Heart Failure Father    Prostate cancer Son     Social History   Tobacco Use   Smoking status: Former    Years: 10    Types: Cigarettes    Start date: 09/14/1975    Quit date: 06/22/1986    Years since quitting: 36.7   Smokeless tobacco: Never  Substance Use Topics  Alcohol use: No    Alcohol/week: 0.0 standard drinks of alcohol     Current Outpatient Medications:    acetaminophen (TYLENOL) 325 MG tablet, Take 650 mg by mouth every 6 (six) hours as needed for mild pain or moderate pain., Disp: , Rfl:    carvedilol (COREG) 6.25 MG tablet, TAKE 1 TABLET BY MOUTH TWICE  DAILY WITH MEALS, Disp: 180 tablet, Rfl: 0   dapagliflozin propanediol (FARXIGA) 10 MG TABS tablet, Take 1 tablet (10 mg total) by mouth daily before breakfast., Disp: 90 tablet, Rfl: 3   ELIQUIS 5 MG TABS tablet, TAKE 1 TABLET BY MOUTH TWICE  DAILY, Disp: 200 tablet, Rfl: 2   levocetirizine (XYZAL) 5 MG tablet, TAKE 1 TABLET BY MOUTH IN THE  EVENING, Disp: 60 tablet, Rfl: 0   Multiple Vitamin (MULTIVITAMIN WITH MINERALS) TABS tablet, Take 1  tablet by mouth daily., Disp: , Rfl:    pantoprazole (PROTONIX) 40 MG tablet, Take 1 tablet (40 mg total) by mouth 2 (two) times daily before a meal., Disp: 60 tablet, Rfl: 2   potassium chloride SA (KLOR-CON M) 20 MEQ tablet, TAKE 1 TABLET BY MOUTH DAILY, Disp: 80 tablet, Rfl: 3   rosuvastatin (CRESTOR) 40 MG tablet, TAKE 1 TABLET BY MOUTH DAILY, Disp: 90 tablet, Rfl: 0   sacubitril-valsartan (ENTRESTO) 97-103 MG, TAKE 1 TABLET BY MOUTH TWICE  DAILY, Disp: 180 tablet, Rfl: 0   spironolactone (ALDACTONE) 25 MG tablet, TAKE 1 TABLET BY MOUTH DAILY, Disp: 90 tablet, Rfl: 0  No Known Allergies  I personally reviewed active problem list, medication list, allergies, family history, social history, health maintenance with the patient/caregiver today.   ROS  ***  Objective  There were no vitals filed for this visit.  There is no height or weight on file to calculate BMI.  Physical Exam ***  No results found for this or any previous visit (from the past 2160 hour(s)).   PHQ2/9:    02/18/2023   10:04 AM 08/26/2022    8:31 AM 08/02/2022    2:12 PM 02/26/2022    8:53 AM 07/09/2021    8:56 AM  Depression screen PHQ 2/9  Decreased Interest 0 0 0 0 0  Down, Depressed, Hopeless 0 0 0 0 0  PHQ - 2 Score 0 0 0 0 0  Altered sleeping  0 0 0 0  Tired, decreased energy  0 0 3 0  Change in appetite  0 0 0 0  Feeling bad or failure about yourself   0 0 0 0  Trouble concentrating  0 0 0 0  Moving slowly or fidgety/restless  0 0 0 0  Suicidal thoughts  0 0 0 0  PHQ-9 Score  0 0 3 0  Difficult doing work/chores   Not difficult at all      phq 9 is {gen pos [id number]}   Fall Risk:    02/18/2023   10:02 AM 08/26/2022    8:31 AM 08/02/2022    2:12 PM 02/26/2022    8:53 AM 07/09/2021    8:56 AM  Fall Risk   Falls in the past year? 0 0 0 0 0  Number falls in past yr: 0  0 0   Injury with Fall? 0  0 0   Risk for fall due to : No Fall Risks No Fall Risks No Fall Risks Impaired balance/gait    Follow up Education provided;Falls prevention discussed Falls prevention discussed;Education provided;Falls evaluation completed Falls prevention discussed;Education provided;Falls evaluation  completed Falls prevention discussed Falls prevention discussed      Functional Status Survey:      Assessment & Plan  *** There are no diagnoses linked to this encounter.

## 2023-03-24 ENCOUNTER — Ambulatory Visit: Payer: Medicare Other | Admitting: Family Medicine

## 2023-03-25 ENCOUNTER — Ambulatory Visit (INDEPENDENT_AMBULATORY_CARE_PROVIDER_SITE_OTHER): Payer: Medicare Other | Admitting: Family Medicine

## 2023-03-25 ENCOUNTER — Encounter: Payer: Self-pay | Admitting: Family Medicine

## 2023-03-25 VITALS — BP 122/70 | HR 68 | Temp 97.6°F | Resp 18 | Ht 67.0 in | Wt 263.9 lb

## 2023-03-25 DIAGNOSIS — Z8711 Personal history of peptic ulcer disease: Secondary | ICD-10-CM | POA: Diagnosis not present

## 2023-03-25 DIAGNOSIS — N3281 Overactive bladder: Secondary | ICD-10-CM | POA: Diagnosis not present

## 2023-03-25 DIAGNOSIS — J410 Simple chronic bronchitis: Secondary | ICD-10-CM

## 2023-03-25 DIAGNOSIS — I482 Chronic atrial fibrillation, unspecified: Secondary | ICD-10-CM | POA: Diagnosis not present

## 2023-03-25 DIAGNOSIS — I272 Pulmonary hypertension, unspecified: Secondary | ICD-10-CM

## 2023-03-25 DIAGNOSIS — I428 Other cardiomyopathies: Secondary | ICD-10-CM | POA: Diagnosis not present

## 2023-03-25 DIAGNOSIS — I5022 Chronic systolic (congestive) heart failure: Secondary | ICD-10-CM

## 2023-03-25 DIAGNOSIS — M170 Bilateral primary osteoarthritis of knee: Secondary | ICD-10-CM | POA: Diagnosis not present

## 2023-03-25 DIAGNOSIS — N1831 Chronic kidney disease, stage 3a: Secondary | ICD-10-CM

## 2023-03-25 DIAGNOSIS — I700 Atherosclerosis of aorta: Secondary | ICD-10-CM | POA: Diagnosis not present

## 2023-03-25 MED ORDER — ROSUVASTATIN CALCIUM 40 MG PO TABS: 40.0000 mg | ORAL_TABLET | Freq: Every day | ORAL | 1 refills | Status: DC

## 2023-03-25 MED ORDER — OXYBUTYNIN CHLORIDE ER 5 MG PO TB24: 5.0000 mg | ORAL_TABLET | Freq: Every day | ORAL | 0 refills | Status: DC

## 2023-03-25 NOTE — Progress Notes (Signed)
Name: Kimberly Henry   MRN: [id number]    DOB: 1941-08-15   Date:03/25/2023       Progress Note  Subjective  Chief Complaint  Follow Up  HPI  CHF.cardiomyopathy : diagnosed in 04/2017, secondary to non ischemic cardiomyopathy.  She is on beta-blocker, spironolactone, Entresto and Farxiga,  last admission was 07/2022 acute on chronic CHF. Denies orthopnea She had a cath done in 2018 showed severe pulmonary hypertension   CT angiography chest with contrast doe 07/2022 - during hospital stay   1. Negative examination for pulmonary embolism. 2. Diffuse bilateral bronchial wall thickening, interlobular septal thickening, and trace bilateral pleural effusions, consistent with pulmonary edema. 3. Gross cardiomegaly and coronary artery disease. 4. Enlargement of the main pulmonary artery, as can be seen in pulmonary hypertension.   Aortic Atherosclerosis (ICD10-I70.0).  Atrial Fibrilation: on Eliquis and is rate controlled with carvedilol, no side effects, denies blood in stools or gum. She was recently diagnosed with bleeding gastric ulcer, she was off eliquis for 3 days after EGD but is back on it. No problems at this time   Echo done 07/2022   1. Left ventricular ejection fraction, by estimation, is 45 to 50%. The  left ventricle has mildly decreased function. The left ventricle  demonstrates global hypokinesis. There is mild left ventricular  hypertrophy. Left ventricular diastolic parameters  are indeterminate. The average left ventricular global longitudinal strain  is -9.8 %. The global longitudinal strain is abnormal.   2. Right ventricular systolic function is normal. The right ventricular  size is mildly enlarged. There is mildly elevated pulmonary artery  systolic pressure.   3. Left atrial size was severely dilated.   4. The mitral valve is degenerative. Mild mitral valve regurgitation. No  evidence of mitral stenosis.   5. Tricuspid valve regurgitation is mild to  moderate.   6. The aortic valve has an indeterminant number of cusps. There is mild  calcification of the aortic valve. There is mild thickening of the aortic  valve. Aortic valve regurgitation is not visualized. Aortic valve  sclerosis/calcification is present,  without any evidence of aortic stenosis.   7. The inferior vena cava is dilated in size with <50% respiratory  variability, suggesting right atrial pressure of 15 mmHg.    Cardiac cath 04/21/2017  1. Mild nonobstructive coronary artery disease. 2. Moderately to severely reduced LV systolic function with an EF of 25-35%. 3. Right heart catheterization showed moderately elevated filling pressures, severe pulmonary hypertension and severely reduced cardiac output at 3.14 with a cardiac index of 1.38. Pulmonary Wedge pressure was 27 mmHg and PA pressure was 61/34 with a mean of 47 mmHg   HTN: she has been taking medication daily, no chest pain or palpitation, denies dizziness. She has stable SOB with activity    OA: she has a long history of OA of both knees. She had total right  knee replacement done by Dr. Odis Luster 06/2020 and had to go back for intra operative manipulation 10/2020, she had left knee replacement 05/2022, she is doing much better, able to walk, no pain just stiffness intermittently . She is stable, she can tell when it is going to rain   Perennial allergic rhinitis: she states symptoms controlled with medication - Xyzal -  denies rhinorrhea, nasal congestion or sneezing.    Chronic bronchitis: she smoked about 1 pack a day for about 20 years but quit many years ago, she states she has been doing well, no cough, wheezing and has  stable  SOB with moderate activity  but is likely multifactorial. She does not like taking inhalers   Morbid Obesity: since January 2024 she has lost 12 lbs. She is trying to eat healthier and avoiding salt . She is happy with the weight loss. She denies epigastric pain or lack of appetite, she is  trying to eat smaller portions     Atherosclerosis of aorta/dyslipidemia: on statin therapy and bp is controlled, last LDL was at goal , down to 68 , she never took zetia due to cost.   CKI stage 3a: she was admitted in Nov for acute on chronic renal failure and GI bleed  , she is on Farxiga and ARB  ( in the form of Entresto) and last GFR was stable , no pruritus and she has good urine output   Gastric ulcer: she was admitted in Nov 2023 for melena. She had EGD and showed an ulcer, taking PPI , but down to 20 mg daily and advised to follow up yearly with GI  Urinary frequency: going on for a long time, she also has some stress incontinence and has to wear a pad. She would like to try medication and discussed PT   Patient Active Problem List   Diagnosis Date Noted   Pulmonary hypertension (HCC) 08/26/2022   Hyperkalemia 08/03/2022   Melena 08/03/2022   Acute on chronic diastolic CHF (congestive heart failure) (HCC) 08/02/2022   S/P TKR (total knee replacement) using cement, left 06/08/2022   Status post knee replacement 06/08/2022   Stage 3a chronic kidney disease (HCC) 02/26/2022   Arthritis of left knee 06/09/2021   S/P TKR (total knee replacement) using cement, right 07/02/2020   Simple chronic bronchitis (HCC)    CAD (coronary artery disease)    Chronic a-fib (HCC)    Bradycardia    Lung nodule seen on imaging study 01/09/2020   NICM (nonischemic cardiomyopathy) (HCC) 01/09/2020   Morbid obesity (HCC) 07/18/2018   Chronic systolic heart failure (HCC) 04/28/2017   AKI (acute kidney injury) (HCC) 04/19/2017   Aortic atherosclerosis (HCC) 03/10/2017   Perennial allergic rhinitis with seasonal variation 05/30/2015   1st degree AV block 03/25/2015   Metabolic syndrome 03/03/2015   History of cervical cancer 03/03/2015   Hypertension, benign 03/03/2015   Osteoarthritis, knee 03/03/2015   Dyslipidemia 03/03/2015    Past Surgical History:  Procedure Laterality Date   ABDOMINAL  HYSTERECTOMY  1996   CARDIAC CATHETERIZATION  2018   d/t CHF   CATARACT EXTRACTION Left 2008   CATARACT EXTRACTION W/PHACO Right 03/11/2021   Procedure: CATARACT EXTRACTION PHACO AND INTRAOCULAR LENS PLACEMENT (IOC) RIGHT;  Surgeon: Lockie Mola, MD;  Location: [facility];  Service: Ophthalmology;  Laterality: Right;  7.70 01:31.1   ESOPHAGOGASTRODUODENOSCOPY (EGD) WITH PROPOFOL N/A 08/04/2022   Procedure: ESOPHAGOGASTRODUODENOSCOPY (EGD) WITH PROPOFOL;  Surgeon: Regis Bill, MD;  Location: [facility];  Service: Endoscopy;  Laterality: N/A;   JOINT REPLACEMENT Right 06/2020   KNEE CLOSED REDUCTION Right 10/29/2020   Procedure: CLOSED MANIPULATION KNEE;  Surgeon: Lyndle Herrlich, MD;  Location: [facility];  Service: Orthopedics;  Laterality: Right;   RIGHT/LEFT HEART CATH AND CORONARY ANGIOGRAPHY N/A 04/21/2017   Procedure: RIGHT/LEFT HEART CATH AND CORONARY ANGIOGRAPHY;  Surgeon: Iran Ouch, MD;  Location: [facility];  Service: Cardiovascular;  Laterality: N/A;   TOTAL KNEE ARTHROPLASTY Right 07/02/2020   Procedure: TOTAL KNEE ARTHROPLASTY;  Surgeon: Lyndle Herrlich, MD;  Location: [facility];  Service: Orthopedics;  Laterality: Right;   TOTAL KNEE ARTHROPLASTY Left 06/08/2022   Procedure: TOTAL KNEE ARTHROPLASTY;  Surgeon: Lyndle Herrlich, MD;  Location: [facility];  Service: Orthopedics;  Laterality: Left;    Family History  Problem Relation Age of Onset   Hypertension Mother    Congestive Heart Failure Mother    Congestive Heart Failure Father    Prostate cancer Son     Social History   Tobacco Use   Smoking status: Former    Current packs/day: 0.00    Types: Cigarettes    Start date: 09/14/1975    Quit date: 06/22/1986    Years since quitting: 36.7   Smokeless tobacco: Never  Substance Use Topics   Alcohol use: No    Alcohol/week: 0.0 standard drinks of alcohol     Current Outpatient Medications:    acetaminophen (TYLENOL) 325 MG  tablet, Take 650 mg by mouth every 6 (six) hours as needed for mild pain or moderate pain., Disp: , Rfl:    carvedilol (COREG) 6.25 MG tablet, TAKE 1 TABLET BY MOUTH TWICE  DAILY WITH MEALS, Disp: 180 tablet, Rfl: 0   dapagliflozin propanediol (FARXIGA) 10 MG TABS tablet, Take 1 tablet (10 mg total) by mouth daily before breakfast., Disp: 90 tablet, Rfl: 3   ELIQUIS 5 MG TABS tablet, TAKE 1 TABLET BY MOUTH TWICE  DAILY, Disp: 200 tablet, Rfl: 2   levocetirizine (XYZAL) 5 MG tablet, TAKE 1 TABLET BY MOUTH IN THE  EVENING, Disp: 60 tablet, Rfl: 0   Multiple Vitamin (MULTIVITAMIN WITH MINERALS) TABS tablet, Take 1 tablet by mouth daily., Disp: , Rfl:    potassium chloride SA (KLOR-CON M) 20 MEQ tablet, TAKE 1 TABLET BY MOUTH DAILY, Disp: 80 tablet, Rfl: 3   rosuvastatin (CRESTOR) 40 MG tablet, TAKE 1 TABLET BY MOUTH DAILY, Disp: 90 tablet, Rfl: 0   sacubitril-valsartan (ENTRESTO) 97-103 MG, TAKE 1 TABLET BY MOUTH TWICE  DAILY, Disp: 180 tablet, Rfl: 0   spironolactone (ALDACTONE) 25 MG tablet, TAKE 1 TABLET BY MOUTH DAILY, Disp: 90 tablet, Rfl: 0   pantoprazole (PROTONIX) 40 MG tablet, Take 1 tablet (40 mg total) by mouth 2 (two) times daily before a meal., Disp: 60 tablet, Rfl: 2  No Known Allergies  I personally reviewed active problem list, medication list, allergies, family history, social history, health maintenance with the patient/caregiver today.   ROS  Constitutional: Negative for fever , positive for weight change.  Respiratory: Negative for cough and shortness of breath.   Cardiovascular: Negative for chest pain or palpitations.  Gastrointestinal: Negative for abdominal pain, no bowel changes.  Musculoskeletal: Negative for gait problem or joint swelling.  Skin: Negative for rash.  Neurological: Negative for dizziness or headache.  No other specific complaints in a complete review of systems (except as listed in HPI above).   Objective  Vitals:   03/25/23 0917  BP: 122/70   Pulse: 68  Resp: 18  Temp: 97.6 F (36.4 C)  TempSrc: Oral  SpO2: 98%  Weight: 263 lb 14.4 oz (119.7 kg)  Height: 5\' 7"  (1.702 m)    Body mass index is 41.33 kg/m.  Physical Exam  Constitutional: Patient appears well-developed and well-nourished. Obese  No distress.  HEENT: head atraumatic, normocephalic, pupils equal and reactive to light, neck supple Cardiovascular: Normal rate, regular rhythm and normal heart sounds.  No murmur heard. Trace  BLE edema. Pulmonary/Chest: Effort normal and breath sounds normal. No respiratory distress. Abdominal: Soft.  There is no tenderness. Psychiatric: Patient  has a normal mood and affect. behavior is normal. Judgment and thought content normal.   PHQ2/9:    03/25/2023    9:19 AM 02/18/2023   10:04 AM 08/26/2022    8:31 AM 08/02/2022    2:12 PM 02/26/2022    8:53 AM  Depression screen PHQ 2/9  Decreased Interest 0 0 0 0 0  Down, Depressed, Hopeless 0 0 0 0 0  PHQ - 2 Score 0 0 0 0 0  Altered sleeping 0  0 0 0  Tired, decreased energy 0  0 0 3  Change in appetite 0  0 0 0  Feeling bad or failure about yourself  0  0 0 0  Trouble concentrating 0  0 0 0  Moving slowly or fidgety/restless 0  0 0 0  Suicidal thoughts 0  0 0 0  PHQ-9 Score 0  0 0 3  Difficult doing work/chores    Not difficult at all     phq 9 is negative   Fall Risk:    03/25/2023    9:19 AM 02/18/2023   10:02 AM 08/26/2022    8:31 AM 08/02/2022    2:12 PM 02/26/2022    8:53 AM  Fall Risk   Falls in the past year? 0 0 0 0 0  Number falls in past yr:  0  0 0  Injury with Fall?  0  0 0  Risk for fall due to : No Fall Risks No Fall Risks No Fall Risks No Fall Risks Impaired balance/gait  Follow up Falls prevention discussed Education provided;Falls prevention discussed Falls prevention discussed;Education provided;Falls evaluation completed Falls prevention discussed;Education provided;Falls evaluation completed Falls prevention discussed      Functional Status  Survey: Is the patient deaf or have difficulty hearing?: No Does the patient have difficulty seeing, even when wearing glasses/contacts?: No Does the patient have difficulty concentrating, remembering, or making decisions?: No Does the patient have difficulty walking or climbing stairs?: No Does the patient have difficulty dressing or bathing?: No Does the patient have difficulty doing errands alone such as visiting a doctor's office or shopping?: No    Assessment & Plan  1. Aortic atherosclerosis (HCC)  - rosuvastatin (CRESTOR) 40 MG tablet; Take 1 tablet (40 mg total) by mouth daily.  Dispense: 90 tablet; Refill: 1  2. Stage 3a chronic kidney disease (HCC)  Stable, on ARB and SGL-2 agonist   3. Chronic systolic heart failure (HCC)  Reviewed last Echo  4. Chronic a-fib (HCC)  On Eliquis   5. Morbid obesity (HCC)  She is losing weight   6. NICM (nonischemic cardiomyopathy) (HCC)  Under the care of cardiologist   7. Simple chronic bronchitis (HCC)  No problems  8. Pulmonary hypertension (HCC)  stable  9. Primary osteoarthritis of both knees  Stable   10. History of gastric ulcer  On lower dose of PPI  11. Overactive bladder  - oxybutynin (DITROPAN XL) 5 MG 24 hr tablet; Take 1 tablet (5 mg total) by mouth at bedtime.  Dispense: 90 tablet; Refill: 0

## 2023-05-26 ENCOUNTER — Other Ambulatory Visit: Payer: Self-pay | Admitting: Family Medicine

## 2023-05-26 DIAGNOSIS — N3281 Overactive bladder: Secondary | ICD-10-CM

## 2023-06-02 ENCOUNTER — Other Ambulatory Visit: Payer: Self-pay | Admitting: Family Medicine

## 2023-06-02 DIAGNOSIS — I700 Atherosclerosis of aorta: Secondary | ICD-10-CM

## 2023-06-24 ENCOUNTER — Other Ambulatory Visit: Payer: Self-pay | Admitting: Cardiovascular Disease

## 2023-06-24 NOTE — Telephone Encounter (Signed)
Hi,  Could you please schedule this patient a 6 month follow up visit? The patient was last seen by Dr. Kirke Corin on 01-27-2023. Thank you so much.

## 2023-06-29 ENCOUNTER — Other Ambulatory Visit: Payer: Self-pay | Admitting: Cardiovascular Disease

## 2023-06-29 ENCOUNTER — Other Ambulatory Visit: Payer: Self-pay | Admitting: Physician Assistant

## 2023-06-29 DIAGNOSIS — J302 Other seasonal allergic rhinitis: Secondary | ICD-10-CM

## 2023-06-29 NOTE — Telephone Encounter (Signed)
Requested Prescriptions  Pending Prescriptions Disp Refills   levocetirizine (XYZAL) 5 MG tablet [Pharmacy Med Name: Levocetirizine Dihydrochloride 5 MG Oral Tablet] 60 tablet 0    Sig: TAKE 1 TABLET BY MOUTH EVERY  EVENING     Ear, Nose, and Throat:  Antihistamines - levocetirizine dihydrochloride Failed - 06/29/2023 10:20 AM      Failed - Cr in normal range and within 360 days    Creat  Date Value Ref Range Status  08/26/2022 1.06 (H) 0.60 - 0.95 mg/dL Final         Passed - eGFR is 10 or above and within 360 days    GFR, Est African American  Date Value Ref Range Status  01/07/2021 72 > OR = 60 mL/min/1.25m2 Final   GFR, Est Non African American  Date Value Ref Range Status  01/07/2021 62 > OR = 60 mL/min/1.89m2 Final   GFR, Estimated  Date Value Ref Range Status  08/04/2022 37 (L) >60 mL/min Final    Comment:    (NOTE) Calculated using the CKD-EPI Creatinine Equation (2021)    eGFR  Date Value Ref Range Status  08/26/2022 53 (L) > OR = 60 mL/min/1.39m2 Final  12/14/2021 48 (L) >59 mL/min/1.73 Final         Passed - Valid encounter within last 12 months    Recent Outpatient Visits           3 months ago Aortic atherosclerosis [facility])   [facility] Alba Cory, MD   10 months ago Chronic a-fib [facility])   [place] [facility] Alba Cory, MD   11 months ago Diarrhea, unspecified type   [facility] Danelle Berry, PA-C   1 year ago NICM (nonischemic cardiomyopathy) [facility])   [facility] Alba Cory, MD   1 year ago Aortic atherosclerosis [facility])    [facility] Alba Cory, MD       Future Appointments             In 2 months Carlynn Purl, Danna Hefty, MD [facility], [facility]

## 2023-06-29 NOTE — Telephone Encounter (Signed)
Good Morning,   Could you schedule this patient a 6 month follow up visit? The patient was last seen by Dr. Kirke Corin on 01-27-2023. Thank you so much.

## 2023-07-05 ENCOUNTER — Telehealth: Payer: Self-pay | Admitting: Pharmacist

## 2023-07-05 NOTE — Progress Notes (Signed)
07/05/2023  Patient ID: Kimberly Henry, female   DOB: August 02, 1941, 82 y.o.   MRN: [id number]  Pharmacy Quality Measure Review  This patient is appearing on a report for being at risk of failing the adherence measure for cholesterol (statin) medications this calendar year.   Medication: Rosuvastatin 40mg  Last fill date: 06/29/23 for 100 day supply  Insurance report was not up to date. No action needed at this time.    Only other current adherence concern is regarding Entresto, but believe patient is likely using patient assistance or samples to obtain.    Marlowe Aschoff, PharmD [facility] Phone Number: [phone]

## 2023-07-29 ENCOUNTER — Other Ambulatory Visit: Payer: Self-pay | Admitting: Family

## 2023-08-22 NOTE — Progress Notes (Deleted)
Name: Kimberly Henry   MRN: [id number]    DOB: [birth year]   Date:08/22/2023       Progress Note  Subjective  Chief Complaint  No chief complaint on file.   HPI  *** Patient Active Problem List   Diagnosis Date Noted   History of gastric ulcer 03/25/2023   Overactive bladder 03/25/2023   Pulmonary hypertension (HCC) 08/26/2022   Hyperkalemia 08/03/2022   Melena 08/03/2022   Acute on chronic diastolic CHF (congestive heart failure) (HCC) 08/02/2022   S/P TKR (total knee replacement) using cement, left 06/08/2022   Status post knee replacement 06/08/2022   Stage 3a chronic kidney disease (HCC) 02/26/2022   Arthritis of left knee 06/09/2021   S/P TKR (total knee replacement) using cement, right 07/02/2020   Simple chronic bronchitis (HCC)    CAD (coronary artery disease)    Chronic a-fib (HCC)    Bradycardia    Lung nodule seen on imaging study 01/09/2020   NICM (nonischemic cardiomyopathy) (HCC) 01/09/2020   Morbid obesity (HCC) 07/18/2018   Chronic systolic heart failure (HCC) 04/28/2017   AKI (acute kidney injury) (HCC) 04/19/2017   Aortic atherosclerosis (HCC) 03/10/2017   Perennial allergic rhinitis with seasonal variation 05/30/2015   1st degree AV block 03/25/2015   Metabolic syndrome 03/03/2015   History of cervical cancer 03/03/2015   Hypertension, benign 03/03/2015   Osteoarthritis, knee 03/03/2015   Dyslipidemia 03/03/2015    Past Surgical History:  Procedure Laterality Date   ABDOMINAL HYSTERECTOMY  1996   CARDIAC CATHETERIZATION  2018   d/t CHF   CATARACT EXTRACTION Left 2008   CATARACT EXTRACTION W/PHACO Right 03/11/2021   Procedure: CATARACT EXTRACTION PHACO AND INTRAOCULAR LENS PLACEMENT (IOC) RIGHT;  Surgeon: Lockie Mola, MD;  Location[facility];  Service: Ophthalmology;  Laterality: Right;  7.70 01:31.1   ESOPHAGOGASTRODUODENOSCOPY (EGD) WITH PROPOFOL N/A 08/04/2022   Procedure: ESOPHAGOGASTRODUODENOSCOPY (EGD) WITH PROPOFOL;   Surgeon: Regis Bill, MD;  Location: [facility] ENDOSCOPY;  Service: Endoscopy;  Laterality: N/A;   JOINT REPLACEMENT Right 06/2020   KNEE CLOSED REDUCTION Right 10/29/2020   Procedure: CLOSED MANIPULATION KNEE;  Surgeon: Lyndle Herrlich, MD;  Location: [facility] ORS;  Service: Orthopedics;  Laterality: Right;   RIGHT/LEFT HEART CATH AND CORONARY ANGIOGRAPHY N/A 04/21/2017   Procedure: RIGHT/LEFT HEART CATH AND CORONARY ANGIOGRAPHY;  Surgeon: Iran Ouch, MD;  Location: [facility] INVASIVE CV LAB;  Service: Cardiovascular;  Laterality: N/A;   TOTAL KNEE ARTHROPLASTY Right 07/02/2020   Procedure: TOTAL KNEE ARTHROPLASTY;  Surgeon: Lyndle Herrlich, MD;  Location: [facility] ORS;  Service: Orthopedics;  Laterality: Right;   TOTAL KNEE ARTHROPLASTY Left 06/08/2022   Procedure: TOTAL KNEE ARTHROPLASTY;  Surgeon: Lyndle Herrlich, MD;  Location: [facility] ORS;  Service: Orthopedics;  Laterality: Left;    Family History  Problem Relation Age of Onset   Hypertension Mother    Congestive Heart Failure Mother    Congestive Heart Failure Father    Prostate cancer Son     Social History   Tobacco Use   Smoking status: Former    Current packs/day: 0.00    Types: Cigarettes    Start date: 09/14/1975    Quit date: 06/22/1986    Years since quitting: 37.1   Smokeless tobacco: Never  Substance Use Topics   Alcohol use: No    Alcohol/week: 0.0 standard drinks of alcohol     Current Outpatient Medications:    acetaminophen (TYLENOL) 325 MG tablet, Take 650 mg by mouth every 6 (six)  hours as needed for mild pain or moderate pain., Disp: , Rfl:    carvedilol (COREG) 6.25 MG tablet, TAKE 1 TABLET BY MOUTH TWICE  DAILY WITH MEALS, Disp: 180 tablet, Rfl: 0   dapagliflozin propanediol (FARXIGA) 10 MG TABS tablet, Take 1 tablet (10 mg total) by mouth daily before breakfast., Disp: 90 tablet, Rfl: 3   ELIQUIS 5 MG TABS tablet, TAKE 1 TABLET BY MOUTH TWICE  DAILY, Disp: 200 tablet, Rfl: 2   levocetirizine (XYZAL) 5 MG  tablet, TAKE 1 TABLET BY MOUTH EVERY  EVENING, Disp: 90 tablet, Rfl: 0   Multiple Vitamin (MULTIVITAMIN WITH MINERALS) TABS tablet, Take 1 tablet by mouth daily., Disp: , Rfl:    oxybutynin (DITROPAN-XL) 5 MG 24 hr tablet, TAKE 1 TABLET BY MOUTH AT  BEDTIME, Disp: 90 tablet, Rfl: 3   pantoprazole (PROTONIX) 20 MG tablet, Take 20 mg by mouth daily., Disp: , Rfl:    potassium chloride SA (KLOR-CON M) 20 MEQ tablet, Take 1 tablet (20 mEq total) by mouth daily. PLEASE CALL [phone] TO SCHEDULE AN APPOINTMENT. THANK YOU., Disp: 90 tablet, Rfl: 0   rosuvastatin (CRESTOR) 40 MG tablet, TAKE 1 TABLET BY MOUTH DAILY, Disp: 100 tablet, Rfl: 0   sacubitril-valsartan (ENTRESTO) 97-103 MG, TAKE 1 TABLET BY MOUTH TWICE  DAILY, Disp: 180 tablet, Rfl: 0   spironolactone (ALDACTONE) 25 MG tablet, TAKE 1 TABLET BY MOUTH DAILY, Disp: 90 tablet, Rfl: 0  No Known Allergies  I personally reviewed {Reviewed:14835} with the patient/caregiver today.   ROS  ***  Objective  There were no vitals filed for this visit.  There is no height or weight on file to calculate BMI.  Physical Exam ***  No results found for this or any previous visit (from the past 2160 hour(s)).  Diabetic Foot Exam: Diabetic Foot Exam - Simple   No data filed    ***  PHQ2/9:    03/25/2023    9:19 AM 02/18/2023   10:04 AM 08/26/2022    8:31 AM 08/02/2022    2:12 PM 02/26/2022    8:53 AM  Depression screen PHQ 2/9  Decreased Interest 0 0 0 0 0  Down, Depressed, Hopeless 0 0 0 0 0  PHQ - 2 Score 0 0 0 0 0  Altered sleeping 0  0 0 0  Tired, decreased energy 0  0 0 3  Change in appetite 0  0 0 0  Feeling bad or failure about yourself  0  0 0 0  Trouble concentrating 0  0 0 0  Moving slowly or fidgety/restless 0  0 0 0  Suicidal thoughts 0  0 0 0  PHQ-9 Score 0  0 0 3  Difficult doing work/chores    Not difficult at all     phq 9 is {gen pos LKG:401027} ***  Fall Risk:    03/25/2023    9:19 AM 02/18/2023   10:02 AM  08/26/2022    8:31 AM 08/02/2022    2:12 PM 02/26/2022    8:53 AM  Fall Risk   Falls in the past year? 0 0 0 0 0  Number falls in past yr:  0  0 0  Injury with Fall?  0  0 0  Risk for fall due to : No Fall Risks No Fall Risks No Fall Risks No Fall Risks Impaired balance/gait  Follow up Falls prevention discussed Education provided;Falls prevention discussed Falls prevention discussed;Education provided;Falls evaluation completed Falls prevention discussed;Education provided;Falls evaluation completed  Falls prevention discussed   ***   Functional Status Survey:   ***   Assessment & Plan  *** There are no diagnoses linked to this encounter.

## 2023-08-30 ENCOUNTER — Other Ambulatory Visit: Payer: Self-pay | Admitting: Cardiovascular Disease

## 2023-08-31 ENCOUNTER — Encounter: Payer: Self-pay | Admitting: Physician Assistant

## 2023-08-31 ENCOUNTER — Ambulatory Visit (INDEPENDENT_AMBULATORY_CARE_PROVIDER_SITE_OTHER): Payer: Medicare Other | Admitting: Physician Assistant

## 2023-08-31 VITALS — BP 128/72 | HR 92 | Temp 98.3°F | Resp 16 | Ht 67.0 in | Wt 269.0 lb

## 2023-08-31 DIAGNOSIS — J069 Acute upper respiratory infection, unspecified: Secondary | ICD-10-CM

## 2023-08-31 MED ORDER — AZITHROMYCIN 250 MG PO TABS: ORAL_TABLET | ORAL | 0 refills | Status: DC

## 2023-08-31 MED ORDER — PREDNISONE 20 MG PO TABS: 40.0000 mg | ORAL_TABLET | Freq: Every day | ORAL | 0 refills | Status: AC

## 2023-08-31 NOTE — Progress Notes (Signed)
Acute Office Visit   Patient: Kimberly Henry   DOB: June 29, 1941   82 y.o. Female  MRN: [id number] Visit Date: 08/31/2023  Today's healthcare provider: Oswaldo Conroy Chevis Weisensel, PA-C  Introduced myself to the patient as a Secondary school teacher and provided education on APPs in clinical practice.    Chief Complaint  Patient presents with   Cough    x1 week, productive   Nasal Congestion   Subjective    HPI HPI     Cough    Additional comments: x1 week, productive      Last edited by Dollene Primrose, CMA on 08/31/2023 11:01 AM.        URI -type symptoms  Onset: unsure  Duration: ongoing for about a week  Associated symptoms: reports intermittently productive cough with yellow phlegm, chest congestion, she reports getting winded after coughing fits    Intervention: Mucinex as needed - thinks this provides relief after taking it but she is not taking consistently   Recent sick contacts: reports her grandson had cough last week but is feeling better now. She thinks a few people at work have also had cold-like symptoms  Recent travel: none  COVID testing at home: She has not tested for COVID at home   Result: NA   Medications: Outpatient Medications Prior to Visit  Medication Sig   acetaminophen (TYLENOL) 325 MG tablet Take 650 mg by mouth every 6 (six) hours as needed for mild pain or moderate pain.   carvedilol (COREG) 6.25 MG tablet TAKE 1 TABLET BY MOUTH TWICE  DAILY WITH MEALS   dapagliflozin propanediol (FARXIGA) 10 MG TABS tablet Take 1 tablet (10 mg total) by mouth daily before breakfast.   ELIQUIS 5 MG TABS tablet TAKE 1 TABLET BY MOUTH TWICE  DAILY   levocetirizine (XYZAL) 5 MG tablet TAKE 1 TABLET BY MOUTH EVERY  EVENING   Multiple Vitamin (MULTIVITAMIN WITH MINERALS) TABS tablet Take 1 tablet by mouth daily.   oxybutynin (DITROPAN-XL) 5 MG 24 hr tablet TAKE 1 TABLET BY MOUTH AT  BEDTIME   pantoprazole (PROTONIX) 20 MG tablet Take 20 mg by mouth daily.   potassium chloride  SA (KLOR-CON M) 20 MEQ tablet TAKE 1 TABLET BY MOUTH DAILY   rosuvastatin (CRESTOR) 40 MG tablet TAKE 1 TABLET BY MOUTH DAILY   sacubitril-valsartan (ENTRESTO) 97-103 MG TAKE 1 TABLET BY MOUTH TWICE  DAILY   spironolactone (ALDACTONE) 25 MG tablet TAKE 1 TABLET BY MOUTH DAILY   No facility-administered medications prior to visit.    Review of Systems  Constitutional:  Positive for fatigue. Negative for chills, diaphoresis and fever.  HENT:  Negative for congestion, ear pain, postnasal drip, rhinorrhea, sinus pressure, sinus pain and sore throat.   Respiratory:  Positive for cough. Negative for shortness of breath and wheezing.   Gastrointestinal:  Negative for diarrhea, nausea and vomiting.  Musculoskeletal:  Negative for myalgias.  Neurological:  Negative for dizziness, light-headedness and headaches.        Objective    BP 128/72   Pulse 92   Temp 98.3 F (36.8 C) (Oral)   Resp 16   Ht 5\' 7"  (1.702 m)   Wt 269 lb (122 kg)   SpO2 96%   BMI 42.13 kg/m     Physical Exam Vitals reviewed.  Constitutional:      General: She is awake.     Appearance: Normal appearance. She is well-developed and well-groomed.  HENT:  Head: Normocephalic and atraumatic.     Right Ear: Hearing, tympanic membrane and ear canal normal.     Left Ear: Hearing, tympanic membrane and ear canal normal.     Mouth/Throat:     Lips: Pink.     Mouth: Mucous membranes are moist.     Pharynx: Oropharynx is clear. Uvula midline. No pharyngeal swelling, oropharyngeal exudate, posterior oropharyngeal erythema, uvula swelling or postnasal drip.  Eyes:     General: Lids are normal. Gaze aligned appropriately.     Extraocular Movements: Extraocular movements intact.  Cardiovascular:     Rate and Rhythm: Bradycardia present. Rhythm irregularly irregular.     Pulses: Normal pulses.          Radial pulses are 2+ on the right side and 2+ on the left side.     Heart sounds: Normal heart sounds. No murmur  heard.    No friction rub. No gallop.  Pulmonary:     Effort: Pulmonary effort is normal.     Breath sounds: Decreased air movement present. Examination of the right-lower field reveals decreased breath sounds. Examination of the left-lower field reveals decreased breath sounds. Decreased breath sounds present. No wheezing, rhonchi or rales.  Musculoskeletal:     Cervical back: Normal range of motion.  Lymphadenopathy:     Head:     Right side of head: No submental, submandibular or preauricular adenopathy.     Left side of head: No submental, submandibular or preauricular adenopathy.     Cervical:     Right cervical: No superficial cervical adenopathy.    Left cervical: No superficial cervical adenopathy.     Upper Body:     Right upper body: No supraclavicular adenopathy.     Left upper body: No supraclavicular adenopathy.  Neurological:     Mental Status: She is alert.  Psychiatric:        Behavior: Behavior is cooperative.       No results found for any visits on 08/31/23.  Assessment & Plan      No follow-ups on file.      Problem List Items Addressed This Visit   None Visit Diagnoses       Upper respiratory tract infection, unspecified type    -  Primary   Relevant Medications   azithromycin (ZITHROMAX) 250 MG tablet   predniSONE (DELTASONE) 20 MG tablet      Acute, new concern Patient reports ongoing productive cough, malaise for about a week.  She also reports that she gets winded after coughing fits Suspect this is likely viral URI but she does have decreased breath sounds in lower lung lobes. Will provide Z-Pak to provide antibiotic coverage as well as inflammatory reduction.  Will also provide prednisone 40 mg p.o. daily x 5-day burst to further assist with subjective shortness of breath Reviewed ED and return precautions Can continue to use over-the-counter medications as needed for symptomatic management Follow-up as needed for progressing or persistent  symptoms   No follow-ups on file.   I, Ayse Mccartin E Thornton Dohrmann, PA-C, have reviewed all documentation for this visit. The documentation on 08/31/23 for the exam, diagnosis, procedures, and orders are all accurate and complete.   Jacquelin Hawking, MHS, PA-C [facility]

## 2023-08-31 NOTE — Patient Instructions (Signed)
I have sent in a script for Prednisone to be taken in the morning with breakfast per the instructions on the container Remember that steroids can cause sleeplessness, irritability, increased hunger and elevated glucose levels so be mindful of these side effects.   I have sent in a Zpack to help with the pulmonary inflammation and the cough Please take according to the instructions on the package.  You can continue to take Mucinex and start using Robitussin to help with the cough  I recommend using Robitussin and Mucinex (regular formulations, nothing with decongestants or DM)  You can also use Tylenol for body aches and fever reduction I also recommend adding an antihistamine to your daily regimen This includes medications like Claritin, Allegra, Zyrtec- the generics of these work very well and are usually less expensive I recommend using Flonase nasal spray - 2 puffs twice per day to help with your nasal congestion The antihistamines and Flonase can take a few weeks to provide significant relief from allergy symptoms but should start to provide some benefit soon. You can use a humidifier at night to help with preventing nasal dryness and irritation   If your symptoms do not improve or become worse in the next 5-7 days please make an apt at the office so we can see you  Go to the ER if you begin to have more serious symptoms such as shortness of breath, trouble breathing, loss of consciousness, swelling around the eyes, high fever, severe lasting headaches, vision changes or neck pain/stiffness.

## 2023-09-06 ENCOUNTER — Other Ambulatory Visit: Payer: Self-pay | Admitting: Family Medicine

## 2023-09-06 DIAGNOSIS — I700 Atherosclerosis of aorta: Secondary | ICD-10-CM

## 2023-09-06 NOTE — Telephone Encounter (Signed)
Requested medication (s) are due for refill today: Yes  Requested medication (s) are on the active medication list: Yes  Last refill:  06/02/23  Future visit scheduled: Yes  Notes to clinic:  Unable to refill per protocol due to failed labs, no updated results.      Requested Prescriptions  Pending Prescriptions Disp Refills   rosuvastatin (CRESTOR) 40 MG tablet [Pharmacy Med Name: Rosuvastatin Calcium 40 MG Oral Tablet] 100 tablet 2    Sig: TAKE 1 TABLET BY MOUTH DAILY     Cardiovascular:  Antilipid - Statins 2 Failed - 09/06/2023  3:24 PM      Failed - Cr in normal range and within 360 days    Creat  Date Value Ref Range Status  08/26/2022 1.06 (H) 0.60 - 0.95 mg/dL Final         Failed - Lipid Panel in normal range within the last 12 months    Cholesterol, Total  Date Value Ref Range Status  12/03/2021 139 100 - 199 mg/dL Final   Cholesterol  Date Value Ref Range Status  08/26/2022 133 <200 mg/dL Final   LDL Cholesterol (Calc)  Date Value Ref Range Status  08/26/2022 68 mg/dL (calc) Final    Comment:    Reference range: <100 . Desirable range <100 mg/dL for primary prevention;   <70 mg/dL for patients with CHD or diabetic patients  with > or = 2 CHD risk factors. Marland Kitchen LDL-C is now calculated using the Martin-Hopkins  calculation, which is a validated novel method providing  better accuracy than the Friedewald equation in the  estimation of LDL-C.  Horald Pollen et al. Lenox Ahr. 6962;952(84): 2061-2068  (http://education.[employer].com/faq/FAQ164)    HDL  Date Value Ref Range Status  08/26/2022 49 (L) > OR = 50 mg/dL Final  13/24/4010 47 >27 mg/dL Final   Triglycerides  Date Value Ref Range Status  08/26/2022 77 <150 mg/dL Final         Passed - Patient is not pregnant      Passed - Valid encounter within last 12 months    Recent Outpatient Visits           6 days ago Upper respiratory tract infection, unspecified type   [place] [facility] Mecum, Erin E, PA-C   5 months ago Aortic atherosclerosis [facility])   [facility] Alba Cory, MD   1 year ago Chronic a-fib [facility])   [place] [facility] Alba Cory, MD   1 year ago Diarrhea, unspecified type   [facility] Danelle Berry, PA-C   1 year ago NICM (nonischemic cardiomyopathy) [facility])   [place] [facility] Alba Cory, MD       Future Appointments             In 1 month Carlynn Purl, Danna Hefty, MD [facility]   In 2 months Kirke Corin, Chelsea Aus, MD [facility]

## 2023-09-08 NOTE — Telephone Encounter (Signed)
Has an appt on 10/29/23

## 2023-09-09 ENCOUNTER — Ambulatory Visit: Payer: Medicare Other | Admitting: Family Medicine

## 2023-10-03 ENCOUNTER — Other Ambulatory Visit: Payer: Self-pay | Admitting: Physician Assistant

## 2023-10-03 ENCOUNTER — Other Ambulatory Visit: Payer: Self-pay | Admitting: Family

## 2023-10-03 DIAGNOSIS — J302 Other seasonal allergic rhinitis: Secondary | ICD-10-CM

## 2023-10-03 NOTE — Telephone Encounter (Signed)
Requested medication (s) are due for refill today: yes  Requested medication (s) are on the active medication list: yes  Last refill:  06/29/23  Future visit scheduled: yes  Notes to clinic:  Unable to refill per protocol due to failed labs, no updated results.      Requested Prescriptions  Pending Prescriptions Disp Refills   levocetirizine (XYZAL) 5 MG tablet [Pharmacy Med Name: Levocetirizine Dihydrochloride 5 MG Oral Tablet] 90 tablet 0    Sig: TAKE 1 TABLET BY MOUTH IN THE  EVENING     Ear, Nose, and Throat:  Antihistamines - levocetirizine dihydrochloride Failed - 10/03/2023  9:30 AM      Failed - Cr in normal range and within 360 days    Creat  Date Value Ref Range Status  08/26/2022 1.06 (H) 0.60 - 0.95 mg/dL Final         Failed - eGFR is 10 or above and within 360 days    GFR, Est African American  Date Value Ref Range Status  01/07/2021 72 > OR = 60 mL/min/1.81m2 Final   GFR, Est Non African American  Date Value Ref Range Status  01/07/2021 62 > OR = 60 mL/min/1.32m2 Final   GFR, Estimated  Date Value Ref Range Status  08/04/2022 37 (L) >60 mL/min Final    Comment:    (NOTE) Calculated using the CKD-EPI Creatinine Equation (2021)    eGFR  Date Value Ref Range Status  08/26/2022 53 (L) > OR = 60 mL/min/1.58m2 Final  12/14/2021 48 (L) >59 mL/min/1.73 Final         Passed - Valid encounter within last 12 months    Recent Outpatient Visits           1 month ago Upper respiratory tract infection, unspecified type   [place] [facility] Mecum, Erin E, PA-C   6 months ago Aortic atherosclerosis [facility])   [place] [facility] Alba Cory, MD   1 year ago Chronic a-fib [facility])   [place] [facility] Alba Cory, MD   1 year ago Diarrhea, unspecified type   [facility] Danelle Berry, PA-C   1 year ago NICM (nonischemic cardiomyopathy) [facility])   [place] [facility] Alba Cory, MD       Future Appointments             In 3 weeks Carlynn Purl, Danna Hefty, MD [facility]   In 1 month Kirke Corin, Chelsea Aus, MD [facility]

## 2023-10-06 NOTE — Progress Notes (Signed)
Advanced Heart Failure Clinic Note    PCP: Alba Cory, MD (last seen 12/24) Cardiologist: Lorine Bears, MD (last seen 05/24)  Chief Complaint: fatigue  HPI:  Ms Folden is a 83 y/o female with a history of AV block, COPD, hyperlipidemia, HTN, osteoarthritis, chronic atrial fibrillation, remote tobacco use, PUD and chronic heart failure (2018). She was diagnosed with systolic heart failure in August of 2018. She was noted to have left bundle branch block. Cardiac catheterization at that time showed mild nonobstructive coronary artery disease with an EF of 25-30%. Right heart catheterization showed severely elevated filling pressures, severely reduced cardiac output and severe pulmonary hypertension.   Admitted 08/02/22 due to shortness of breath, leg swelling, weight gain and diarrhea. Also noticed dark stools. Initially given IV lasix with transition or oral diuretics. GI consult obtained. ReDs clip reading 19%. Hyperkalemia improved. Eliquis held and IV protonix given. EGD performed. Eliquis resumed. Discharged after 2 days.   Echo 04/19/17: EF of 30-35% along with mild MR and moderate TR.  Echo 08/26/17: EF of 30-35% along with moderate MR and normal PA pressure.  Echo 01/22/20: EF of 50-55% along with mildly elevated PA pressure.  Echo 08/03/22: EF of 45-50% with mildly elevated PA pressure, severe LAE, mild MR and mild/moderate TR.   She presents today for a HF follow-up visit with a chief complaint of minimal fatigue with a lot of exertion. Didn't have any issues walking up to the office today. Has associated occasional dizziness along with this. Denies shortness of breath, chest pain, cough, palpitations, abdominal distention, pedal edema, difficulty sleeping or weight gain. Reports sleeping well with 1 pillow.    Previous cardiac studies:  Right/Left Cardiac catheterization 04/21/17:  mild nonobstructive CAD with an EF of 25-35%.  Moderately elevated filling pressure, severe  pulmonary HTN   Severely reduced cardiac output at 3.14 with a cardiac index of 1.38.  PA pressure was 61/34 with a mean of 47 mm Hg.    ROS: All systems negative except as listed in HPI, PMH and Problem list   Past Medical History:  Diagnosis Date   Acid phosphatase elevated    Aortic atherosclerosis (HCC)    Atrial fibrillation (HCC)    a.) CHA2DS2VASc = 6 (age x 2, sex, HFrEF, HTN, vascular disease history);  b.) rate/rhythm maintained on oral carvedilol; chronically anticoagulated with apixaban   AV block, 1st degree    Cervical cancer (HCC) 1999   COPD (chronic obstructive pulmonary disease) (HCC)    HFrEF (heart failure with reduced ejection fraction) (HCC)    a.) 04/2017 Echo: EF 30-35%, antsept HK, mild MR, mod dil LA, mildly dil RA, mod TR, mildly to mod elev PASP; b.) 08/2017 Echo: EF 30-35%, diff HK, antsept HK. Gr1 DD. Mild to mod MR. PASP nl; c.) TTE 01/22/2020: EF 50-55%, mild LAE, triv MR; RVSP 38.3   Hx of cervical malignancy    Hyperlipidemia    Hypertension    LBBB (left bundle branch block)    Long term current use of anticoagulant    a.) apixaban   Metabolic syndrome    NICM (nonischemic cardiomyopathy) (HCC)    a.) 04/2017 Echo: EF 30-35%; b.) 04/2017 Cath: nonobs dzs; c.) 08/2017 Echo: EF 30-35%, diff HK, antsept HK. Gr1 DD. Mild to mod MR. PASP nl; d.) TTE 01/22/2020: EF 50-55%, mild LAE, triv MR, RVSP 38.3   Non-obstructive CAD (coronary artery disease)    a. 04/2017 Cath: mild nonobs CAD. EF 25-35%. CO 3.14, CI 1.38. Sev  PAH [61/34(47].   Obesity, Class III, BMI 40-49.9 (morbid obesity) (HCC)    Osteoarthritis of both knees    PAH (pulmonary arterial hypertension) with portal hypertension (HCC)    a.) 04/2017 Right Heart Cath: Sev PAH 61/34(47); b.) 08/2017 Echo: nl PASP; c.) TTE 01/22/2020: RVSP 38.3    Current Outpatient Medications  Medication Sig Dispense Refill   acetaminophen (TYLENOL) 325 MG tablet Take 650 mg by mouth every 6 (six) hours as needed for  mild pain or moderate pain.     azithromycin (ZITHROMAX) 250 MG tablet Take 500mg  PO daily x1d and then 250mg  daily x4 days 6 each 0   carvedilol (COREG) 6.25 MG tablet TAKE 1 TABLET BY MOUTH TWICE  DAILY WITH MEALS 180 tablet 0   dapagliflozin propanediol (FARXIGA) 10 MG TABS tablet TAKE 1 TABLET BY MOUTH DAILY  BEFORE BREAKFAST 30 tablet 0   ELIQUIS 5 MG TABS tablet TAKE 1 TABLET BY MOUTH TWICE  DAILY 200 tablet 2   levocetirizine (XYZAL) 5 MG tablet TAKE 1 TABLET BY MOUTH IN THE  EVENING 90 tablet 0   Multiple Vitamin (MULTIVITAMIN WITH MINERALS) TABS tablet Take 1 tablet by mouth daily.     oxybutynin (DITROPAN-XL) 5 MG 24 hr tablet TAKE 1 TABLET BY MOUTH AT  BEDTIME 90 tablet 3   pantoprazole (PROTONIX) 20 MG tablet Take 20 mg by mouth daily.     potassium chloride SA (KLOR-CON M) 20 MEQ tablet TAKE 1 TABLET BY MOUTH DAILY 90 tablet 0   rosuvastatin (CRESTOR) 40 MG tablet TAKE 1 TABLET BY MOUTH DAILY 100 tablet 2   sacubitril-valsartan (ENTRESTO) 97-103 MG TAKE 1 TABLET BY MOUTH TWICE  DAILY 180 tablet 0   spironolactone (ALDACTONE) 25 MG tablet TAKE 1 TABLET BY MOUTH DAILY 90 tablet 0   No current facility-administered medications for this visit.    No Known Allergies    Social History   Socioeconomic History   Marital status: Widowed    Spouse name: Not on file   Number of children: 4   Years of education: 73   Highest education level: 12th grade  Occupational History   Occupation: helps disabled persons    Comment: part time  Tobacco Use   Smoking status: Former    Current packs/day: 0.00    Types: Cigarettes    Start date: 09/14/1975    Quit date: 06/22/1986    Years since quitting: 37.3   Smokeless tobacco: Never  Vaping Use   Vaping status: Never Used  Substance and Sexual Activity   Alcohol use: No    Alcohol/week: 0.0 standard drinks of alcohol   Drug use: No   Sexual activity: Not Currently  Other Topics Concern   Not on file  Social History Narrative    Patient has 4 grown children-1 of which is deceased, 67 grandchildren & 5 great-grandchildren      Patient lives with her grandson.    Social Drivers of Corporate investment banker Strain: Low Risk  (02/18/2023)   Overall Financial Resource Strain (CARDIA)    Difficulty of Paying Living Expenses: Not hard at all  Food Insecurity: No Food Insecurity (02/18/2023)   Hunger Vital Sign    Worried About Running Out of Food in the Last Year: Never true    Ran Out of Food in the Last Year: Never true  Transportation Needs: No Transportation Needs (02/18/2023)   PRAPARE - Administrator, Civil Service (Medical): No    Lack  of Transportation (Non-Medical): No  Physical Activity: Insufficiently Active (02/18/2023)   Exercise Vital Sign    Days of Exercise per Week: 4 days    Minutes of Exercise per Session: 30 min  Stress: No Stress Concern Present (02/18/2023)   [employer] of Occupational Health - Occupational Stress Questionnaire    Feeling of Stress : Not at all  Social Connections: Moderately Isolated (02/18/2023)   Social Connection and Isolation Panel [NHANES]    Frequency of Communication with Friends and Family: More than three times a week    Frequency of Social Gatherings with Friends and Family: Three times a week    Attends Religious Services: More than 4 times per year    Active Member of Clubs or Organizations: No    Attends Banker Meetings: Never    Marital Status: Widowed  Intimate Partner Violence: Not At Risk (02/18/2023)   Humiliation, Afraid, Rape, and Kick questionnaire    Fear of Current or Ex-Partner: No    Emotionally Abused: No    Physically Abused: No    Sexually Abused: No      Family History  Problem Relation Age of Onset   Hypertension Mother    Congestive Heart Failure Mother    Congestive Heart Failure Father    Prostate cancer Son    Vitals:   10/07/23 0921  BP: 134/76  Pulse: (!) 50  SpO2: 98%  Weight: 265 lb (120.2 kg)   Height: 5\' 5"  (1.651 m)   Wt Readings from Last 3 Encounters:  10/07/23 265 lb (120.2 kg)  08/31/23 269 lb (122 kg)  03/25/23 263 lb 14.4 oz (119.7 kg)   Lab Results  Component Value Date   CREATININE 1.06 (H) 08/26/2022   CREATININE 1.41 (H) 08/04/2022   CREATININE 1.49 (H) 08/03/2022   PHYSICAL EXAM: General:  Well appearing. No respiratory difficulty HEENT: normal Neck: supple. no JVD. Carotids 2+ bilat; no bruits. No lymphadenopathy or thyromegaly appreciated. Cor: PMI nondisplaced. Bradycardic & irregular rhythm. No rubs, gallops or murmurs. Lungs: clear Abdomen: soft, nontender, nondistended. No hepatosplenomegaly. No bruits or masses. Good bowel sounds. Extremities: no cyanosis, clubbing, rash, trace pitting edema bilateral lower legs Neuro: alert & oriented x 3, cranial nerves grossly intact. moves all 4 extremities w/o difficulty. Affect pleasant.  ECG: atrial fibrillation, HR 54 (personally reviewed)   ASSESSMENT & PLAN:  1: NICM with mildly reduced ejection fraction- - suspect due to atrial fibrillation - NYHA class II - Euvolemic today - weighing daily; reminded to call for an overnight weight gain of >2 pounds or a weekly weight gain of >5 pounds - weight down 12 pounds from last visit here 1 year ago - Echo 04/19/17: EF of 30-35% along with mild MR and moderate TR.  - Echo 08/26/17: EF of 30-35% along with moderate MR and normal PA pressure.  - Echo 01/22/20: EF of 50-55% along with mildly elevated PA pressure.  - Echo 08/03/22: EF of 45-50% with mildly elevated PA pressure, severe LAE, mild MR and mild/moderate TR. - continue carvedilol 6.25mg  BID - continue farxiga 10mg  daily - continue potassium daily - continue entresto 97/103mg  BID - continue spironolactone 25mg  daily - not adding salt and has been reading food labels; using no sodium seasoning such as Mrs. Dash.  - does wear support socks & elevates her legs - BNP 08/02/22 was 740.4  2: HTN- -  BP 134/76 - saw PCP (Sowles) 12/24 - BMP 08/26/22 reviewed and showed sodium 144, potassium  4.1, creatinine 1.06 and GFR 53  3: Anemia- - EGD done 11/23 - hemoglobin 08/26/22 was 10.9  4: Atrial fibrillation- - saw cardiology Kirke Corin) 05/24 - continue apixaban 5mg  BID - EKG today shows atrial fibrillation  5: Hyperlipidemia- - continue rosuvastatin 40mg  daily - LDL 08/26/22 was 68   Discussed getting echo updated as well as BMET, CBC & lipids but patient declined. She wants to wait until she sees her PCP and cardiology. Will message PCP about getting labs at next visit.   Patient opts to not make a return appointment at this time. Emphasized that she follow closely with cardiology and PCP but that she could return at anytime and she was comfortable with this plan.

## 2023-10-07 ENCOUNTER — Encounter: Payer: Self-pay | Admitting: Family

## 2023-10-07 ENCOUNTER — Ambulatory Visit: Payer: Medicare Other | Attending: Family | Admitting: Family

## 2023-10-07 VITALS — BP 134/76 | HR 50 | Ht 65.0 in | Wt 265.0 lb

## 2023-10-07 DIAGNOSIS — I428 Other cardiomyopathies: Secondary | ICD-10-CM | POA: Diagnosis not present

## 2023-10-07 DIAGNOSIS — I5022 Chronic systolic (congestive) heart failure: Secondary | ICD-10-CM | POA: Insufficient documentation

## 2023-10-07 DIAGNOSIS — Z7984 Long term (current) use of oral hypoglycemic drugs: Secondary | ICD-10-CM | POA: Insufficient documentation

## 2023-10-07 DIAGNOSIS — D649 Anemia, unspecified: Secondary | ICD-10-CM | POA: Diagnosis not present

## 2023-10-07 DIAGNOSIS — Z8711 Personal history of peptic ulcer disease: Secondary | ICD-10-CM | POA: Diagnosis not present

## 2023-10-07 DIAGNOSIS — I5042 Chronic combined systolic (congestive) and diastolic (congestive) heart failure: Secondary | ICD-10-CM | POA: Diagnosis not present

## 2023-10-07 DIAGNOSIS — Z7901 Long term (current) use of anticoagulants: Secondary | ICD-10-CM | POA: Insufficient documentation

## 2023-10-07 DIAGNOSIS — K921 Melena: Secondary | ICD-10-CM | POA: Diagnosis not present

## 2023-10-07 DIAGNOSIS — Z79899 Other long term (current) drug therapy: Secondary | ICD-10-CM | POA: Insufficient documentation

## 2023-10-07 DIAGNOSIS — I2721 Secondary pulmonary arterial hypertension: Secondary | ICD-10-CM | POA: Diagnosis not present

## 2023-10-07 DIAGNOSIS — Z87891 Personal history of nicotine dependence: Secondary | ICD-10-CM | POA: Diagnosis not present

## 2023-10-07 DIAGNOSIS — E785 Hyperlipidemia, unspecified: Secondary | ICD-10-CM | POA: Insufficient documentation

## 2023-10-07 DIAGNOSIS — I447 Left bundle-branch block, unspecified: Secondary | ICD-10-CM | POA: Insufficient documentation

## 2023-10-07 DIAGNOSIS — I482 Chronic atrial fibrillation, unspecified: Secondary | ICD-10-CM | POA: Insufficient documentation

## 2023-10-07 DIAGNOSIS — J449 Chronic obstructive pulmonary disease, unspecified: Secondary | ICD-10-CM | POA: Insufficient documentation

## 2023-10-07 DIAGNOSIS — I110 Hypertensive heart disease with heart failure: Secondary | ICD-10-CM | POA: Diagnosis not present

## 2023-10-07 DIAGNOSIS — I2510 Atherosclerotic heart disease of native coronary artery without angina pectoris: Secondary | ICD-10-CM | POA: Diagnosis not present

## 2023-10-07 MED ORDER — DAPAGLIFLOZIN PROPANEDIOL 10 MG PO TABS: 10.0000 mg | ORAL_TABLET | Freq: Every day | ORAL | 3 refills | Status: DC

## 2023-10-07 NOTE — Patient Instructions (Signed)
Make sure you are wearing your compression socks daily.  Follow up as needed if you have any new symptoms occur.

## 2023-10-08 DIAGNOSIS — I5022 Chronic systolic (congestive) heart failure: Secondary | ICD-10-CM | POA: Diagnosis not present

## 2023-10-24 ENCOUNTER — Encounter: Payer: Self-pay | Admitting: Family Medicine

## 2023-10-24 ENCOUNTER — Ambulatory Visit (INDEPENDENT_AMBULATORY_CARE_PROVIDER_SITE_OTHER): Payer: Medicare Other | Admitting: Family Medicine

## 2023-10-24 VITALS — BP 126/78 | HR 76 | Resp 16 | Ht 65.0 in | Wt 264.9 lb

## 2023-10-24 DIAGNOSIS — I700 Atherosclerosis of aorta: Secondary | ICD-10-CM

## 2023-10-24 DIAGNOSIS — I428 Other cardiomyopathies: Secondary | ICD-10-CM

## 2023-10-24 DIAGNOSIS — I5022 Chronic systolic (congestive) heart failure: Secondary | ICD-10-CM | POA: Diagnosis not present

## 2023-10-24 DIAGNOSIS — E559 Vitamin D deficiency, unspecified: Secondary | ICD-10-CM

## 2023-10-24 DIAGNOSIS — I272 Pulmonary hypertension, unspecified: Secondary | ICD-10-CM | POA: Diagnosis not present

## 2023-10-24 DIAGNOSIS — N1831 Chronic kidney disease, stage 3a: Secondary | ICD-10-CM

## 2023-10-24 DIAGNOSIS — M170 Bilateral primary osteoarthritis of knee: Secondary | ICD-10-CM

## 2023-10-24 DIAGNOSIS — J410 Simple chronic bronchitis: Secondary | ICD-10-CM

## 2023-10-24 DIAGNOSIS — I482 Chronic atrial fibrillation, unspecified: Secondary | ICD-10-CM

## 2023-10-24 NOTE — Progress Notes (Addendum)
 Name: Kimberly Henry   MRN: [id number]    DOB: Dec 09, 1940   Date:10/24/2023       Progress Note  Subjective  Chief Complaint  Chief Complaint  Patient presents with   Medical Management of Chronic Issues   HPI   CHF/ non ischemic cardiomyopathy/Pulmonary hypertension : diagnosed in 04/2017, secondary to non ischemic cardiomyopathy.  She is on beta-blocker, spironolactone , Entresto  and Farxiga ,  last admission was 07/2022 acute on chronic CHF. Denies orthopnea She had a cath done in 2018 showed severe pulmonary hypertension . She is under the care of CHF clinic, taking medications as prescribed   CT angiography chest with contrast doe 07/2022 - during hospital stay    1. Negative examination for pulmonary embolism. 2. Diffuse bilateral bronchial wall thickening, interlobular septal thickening, and trace bilateral pleural effusions, consistent with pulmonary edema. 3. Gross cardiomegaly and coronary artery disease. 4. Enlargement of the main pulmonary artery, as can be seen in pulmonary hypertension.   Aortic Atherosclerosis (ICD10-I70.0) and Dyslipidemia: taking statin therapy and denies side effects, we will recheck labs today    Atrial Fibrilation: on Eliquis  and is rate controlled with carvedilol , no side effects, denies bleeding. Doing well    HTN: she has been taking medication daily, no chest pain or palpitation, very seldom gest dizzy.  She has stable SOB with activity    OA: she has a long history of OA of both knees. She had total right  knee replacement done by Dr. Hobart Lulas 06/2020 and had to go back for intra operative manipulation 10/2020, she had left knee replacement 05/2022, she is doing much better, able to walk, no pain just stiffness intermittently . She is stable, takes Tylenol  prn only   Perennial allergic rhinitis: she states symptoms controlled with medication - Xyzal  - she has intermittent rhinorrhea, worse with weather changes   Chronic bronchitis: she smoked  about 1 pack a day for about 20 years but quit many years ago, she states she has been doing well, no cough or wheezing, she has stable SOB with moderate activity  but is likely multifactorial. She does not like taking inhalers    Morbid Obesity: since January 2024 she has lost 12 lbs. She is trying to eat healthier and avoiding salt . She is happy with the weight loss. She denies epigastric pain or lack of appetite, she is trying to eat smaller portions   CKI stage 3a: she was admitted in Nov for acute on chronic renal failure and GI bleed  , she is on Farxiga  and ARB  ( in the form of Entresto ) we will recheck labs . She denies pruritus    Gastric ulcer: she was admitted in Nov 2023 for melena. She had EGD and showed an ulcer, taking PPI , but down to 20 mg daily and advised to follow up yearly with GI, she is stable, no history of bleeding since   Urinary frequency: she is doing better with Ditropan  XL     Patient Active Problem List   Diagnosis Date Noted   History of gastric ulcer 03/25/2023   Overactive bladder 03/25/2023   Pulmonary hypertension (HCC) 08/26/2022   Hyperkalemia 08/03/2022   Melena 08/03/2022   Acute on chronic diastolic CHF (congestive heart failure) (HCC) 08/02/2022   S/P TKR (total knee replacement) using cement, left 06/08/2022   Status post knee replacement 06/08/2022   Stage 3a chronic kidney disease (HCC) 02/26/2022   Arthritis of left knee 06/09/2021   S/P TKR (total  knee replacement) using cement, right 07/02/2020   Simple chronic bronchitis (HCC)    CAD (coronary artery disease)    Chronic a-fib (HCC)    Bradycardia    Lung nodule seen on imaging study 01/09/2020   NICM (nonischemic cardiomyopathy) (HCC) 01/09/2020   Morbid obesity (HCC) 07/18/2018   Chronic systolic heart failure (HCC) 04/28/2017   AKI (acute kidney injury) (HCC) 04/19/2017   Aortic atherosclerosis (HCC) 03/10/2017   Perennial allergic rhinitis with seasonal variation 05/30/2015    1st degree AV block 03/25/2015   Metabolic syndrome 03/03/2015   History of cervical cancer 03/03/2015   Hypertension, benign 03/03/2015   Osteoarthritis, knee 03/03/2015   Dyslipidemia 03/03/2015    Past Surgical History:  Procedure Laterality Date   ABDOMINAL HYSTERECTOMY  1996   CARDIAC CATHETERIZATION  2018   d/t CHF   CATARACT EXTRACTION Left 2008   CATARACT EXTRACTION W/PHACO Right 03/11/2021   Procedure: CATARACT EXTRACTION PHACO AND INTRAOCULAR LENS PLACEMENT (IOC) RIGHT;  Surgeon: Annell Kidney, MD;  Location: [facility];  Service: Ophthalmology;  Laterality: Right;  7.70 01:31.1   ESOPHAGOGASTRODUODENOSCOPY (EGD) WITH PROPOFOL  N/A 08/04/2022   Procedure: ESOPHAGOGASTRODUODENOSCOPY (EGD) WITH PROPOFOL ;  Surgeon: Shane Darling, MD;  Location: [facility];  Service: Endoscopy;  Laterality: N/A;   JOINT REPLACEMENT Right 06/2020   KNEE CLOSED REDUCTION Right 10/29/2020   Procedure: CLOSED MANIPULATION KNEE;  Surgeon: Jerlyn Moons, MD;  Location: [facility];  Service: Orthopedics;  Laterality: Right;   RIGHT/LEFT HEART CATH AND CORONARY ANGIOGRAPHY N/A 04/21/2017   Procedure: RIGHT/LEFT HEART CATH AND CORONARY ANGIOGRAPHY;  Surgeon: Wenona Hamilton, MD;  Location: [facility];  Service: Cardiovascular;  Laterality: N/A;   TOTAL KNEE ARTHROPLASTY Right 07/02/2020   Procedure: TOTAL KNEE ARTHROPLASTY;  Surgeon: Jerlyn Moons, MD;  Location: [facility];  Service: Orthopedics;  Laterality: Right;   TOTAL KNEE ARTHROPLASTY Left 06/08/2022   Procedure: TOTAL KNEE ARTHROPLASTY;  Surgeon: Jerlyn Moons, MD;  Location: [facility];  Service: Orthopedics;  Laterality: Left;    Family History  Problem Relation Age of Onset   Hypertension Mother    Congestive Heart Failure Mother    Congestive Heart Failure Father    Prostate cancer Son     Social History   Tobacco Use   Smoking status: Former    Current packs/day: 0.00    Types: Cigarettes    Start  date: 09/14/1975    Quit date: 06/22/1986    Years since quitting: 37.3   Smokeless tobacco: Never  Substance Use Topics   Alcohol use: No    Alcohol/week: 0.0 standard drinks of alcohol     Current Outpatient Medications:    acetaminophen  (TYLENOL ) 325 MG tablet, Take 650 mg by mouth every 6 (six) hours as needed for mild pain or moderate pain., Disp: , Rfl:    carvedilol  (COREG ) 6.25 MG tablet, TAKE 1 TABLET BY MOUTH TWICE  DAILY WITH MEALS, Disp: 180 tablet, Rfl: 0   dapagliflozin  propanediol (FARXIGA ) 10 MG TABS tablet, Take 1 tablet (10 mg total) by mouth daily before breakfast., Disp: 90 tablet, Rfl: 3   ELIQUIS  5 MG TABS tablet, TAKE 1 TABLET BY MOUTH TWICE  DAILY, Disp: 200 tablet, Rfl: 2   levocetirizine (XYZAL ) 5 MG tablet, TAKE 1 TABLET BY MOUTH IN THE  EVENING, Disp: 90 tablet, Rfl: 0   Multiple Vitamin (MULTIVITAMIN WITH MINERALS) TABS tablet, Take 1 tablet by mouth daily., Disp: , Rfl:    oxybutynin  (DITROPAN -XL) 5  MG 24 hr tablet, TAKE 1 TABLET BY MOUTH AT  BEDTIME, Disp: 90 tablet, Rfl: 3   pantoprazole  (PROTONIX ) 20 MG tablet, Take 20 mg by mouth daily., Disp: , Rfl:    potassium chloride  SA (KLOR-CON  M) 20 MEQ tablet, TAKE 1 TABLET BY MOUTH DAILY, Disp: 90 tablet, Rfl: 0   rosuvastatin  (CRESTOR ) 40 MG tablet, TAKE 1 TABLET BY MOUTH DAILY, Disp: 100 tablet, Rfl: 2   sacubitril -valsartan  (ENTRESTO ) 97-103 MG, TAKE 1 TABLET BY MOUTH TWICE  DAILY, Disp: 180 tablet, Rfl: 0   spironolactone  (ALDACTONE ) 25 MG tablet, TAKE 1 TABLET BY MOUTH DAILY, Disp: 90 tablet, Rfl: 0  No Known Allergies  I personally reviewed active problem list, medication list, allergies with the patient/caregiver today.   ROS  Ten systems reviewed and is negative except as mentioned in HPI    Objective  Vitals:   10/24/23 0907  BP: 126/78  Pulse: 76  Resp: 16  SpO2: 98%  Weight: 264 lb 14.4 oz (120.2 kg)  Height: 5\' 5"  (1.651 m)    Body mass index is 44.08 kg/m.  Physical  Exam  Constitutional: Patient appears well-developed and well-nourished. Obese No distress.  HEENT: head atraumatic, normocephalic, pupils equal and reactive to light, neck supple Cardiovascular: Normal rate, irregular rhythm and normal heart sounds.  No murmur heard. No BLE edema. Pulmonary/Chest: Effort normal and breath sounds normal. No respiratory distress. Abdominal: Soft.  There is no tenderness. Psychiatric: Patient has a normal mood and affect. behavior is normal. Judgment and thought content normal.   Diabetic Foot Exam:     PHQ2/9:    10/24/2023    9:03 AM 03/25/2023    9:19 AM 02/18/2023   10:04 AM 08/26/2022    8:31 AM 08/02/2022    2:12 PM  Depression screen PHQ 2/9  Decreased Interest 0 0 0 0 0  Down, Depressed, Hopeless 0 0 0 0 0  PHQ - 2 Score 0 0 0 0 0  Altered sleeping 0 0  0 0  Tired, decreased energy 0 0  0 0  Change in appetite 0 0  0 0  Feeling bad or failure about yourself  0 0  0 0  Trouble concentrating 0 0  0 0  Moving slowly or fidgety/restless 0 0  0 0  Suicidal thoughts 0 0  0 0  PHQ-9 Score 0 0  0 0  Difficult doing work/chores Not difficult at all    Not difficult at all    phq 9 is negative  Fall Risk:    10/24/2023    9:03 AM 03/25/2023    9:19 AM 02/18/2023   10:02 AM 08/26/2022    8:31 AM 08/02/2022    2:12 PM  Fall Risk   Falls in the past year? 0 0 0 0 0  Number falls in past yr: 0  0  0  Injury with Fall? 0  0  0  Risk for fall due to : No Fall Risks No Fall Risks No Fall Risks No Fall Risks No Fall Risks  Follow up Falls prevention discussed;Education provided;Falls evaluation completed Falls prevention discussed Education provided;Falls prevention discussed Falls prevention discussed;Education provided;Falls evaluation completed Falls prevention discussed;Education provided;Falls evaluation completed     Assessment & Plan   1. Chronic systolic heart failure (HCC) (Primary)  Doing well on current regiment   2. Chronic a-fib  (HCC)  Rate controlled   3. Aortic atherosclerosis (HCC)  - Lipid panel  4. Stage 3a chronic  kidney disease (HCC)  - COMPLETE METABOLIC PANEL WITH GFR - CBC with Differential/Platelet - VITAMIN D  25 Hydroxy (Vit-D Deficiency, Fractures)  5. Morbid obesity (HCC)  Discussed with the patient the risk posed by an increased BMI. Discussed importance of portion control, calorie counting and at least 150 minutes of physical activity weekly. Avoid sweet beverages and drink more water. Eat at least 6 servings of fruit and vegetables daily    6. Simple chronic bronchitis (HCC)  stable  7. Pulmonary hypertension (HCC)  Under the care of cardiologist   8. NICM (nonischemic cardiomyopathy) (HCC)  Denies chest pain, mild sob with activity   9. Primary osteoarthritis of both knees  Doing well on tylenol    10. Vitamin D  deficiency  - VITAMIN D  25 Hydroxy (Vit-D Deficiency, Fractures)

## 2023-10-25 ENCOUNTER — Encounter: Payer: Self-pay | Admitting: Family Medicine

## 2023-10-25 LAB — COMPLETE METABOLIC PANEL WITH GFR
AG Ratio: 1.4 (calc) (ref 1.0–2.5)
ALT: 14 U/L (ref 6–29)
AST: 18 U/L (ref 10–35)
Albumin: 4.2 g/dL (ref 3.6–5.1)
Alkaline phosphatase (APISO): 122 U/L (ref 37–153)
BUN/Creatinine Ratio: 16 (calc) (ref 6–22)
BUN: 16 mg/dL (ref 7–25)
CO2: 25 mmol/L (ref 20–32)
Calcium: 10 mg/dL (ref 8.6–10.4)
Chloride: 109 mmol/L (ref 98–110)
Creat: 1.03 mg/dL — ABNORMAL HIGH (ref 0.60–0.95)
Globulin: 2.9 g/dL (ref 1.9–3.7)
Glucose, Bld: 86 mg/dL (ref 65–99)
Potassium: 4.2 mmol/L (ref 3.5–5.3)
Sodium: 143 mmol/L (ref 135–146)
Total Bilirubin: 0.8 mg/dL (ref 0.2–1.2)
Total Protein: 7.1 g/dL (ref 6.1–8.1)
eGFR: 54 mL/min/{1.73_m2} — ABNORMAL LOW (ref >=60)

## 2023-10-25 LAB — CBC WITH DIFFERENTIAL/PLATELET
Absolute Lymphocytes: 1775 {cells}/uL (ref 850–3900)
Absolute Monocytes: 230 {cells}/uL (ref 200–950)
Basophils Absolute: 21 {cells}/uL (ref 0–200)
Basophils Relative: 0.5 %
Eosinophils Absolute: 98 {cells}/uL (ref 15–500)
Eosinophils Relative: 2.4 %
HCT: 40.4 % (ref 35.0–45.0)
Hemoglobin: 13.4 g/dL (ref 11.7–15.5)
MCH: 31.5 pg (ref 27.0–33.0)
MCHC: 33.2 g/dL (ref 32.0–36.0)
MCV: 95.1 fL (ref 80.0–100.0)
MPV: 10.9 fL (ref 7.5–12.5)
Monocytes Relative: 5.6 %
Neutro Abs: 1976 {cells}/uL (ref 1500–7800)
Neutrophils Relative %: 48.2 %
Platelets: 221 10*3/uL (ref 140–400)
RBC: 4.25 10*6/uL (ref 3.80–5.10)
RDW: 12.3 % (ref 11.0–15.0)
Total Lymphocyte: 43.3 %
WBC: 4.1 10*3/uL (ref 3.8–10.8)

## 2023-10-25 LAB — VITAMIN D 25 HYDROXY (VIT D DEFICIENCY, FRACTURES): Vit D, 25-Hydroxy: 37 ng/mL (ref 30–100)

## 2023-10-25 LAB — LIPID PANEL
Cholesterol: 144 mg/dL (ref <200)
HDL: 48 mg/dL — ABNORMAL LOW (ref >=50)
LDL Cholesterol (Calc): 79 mg/dL
Non-HDL Cholesterol (Calc): 96 mg/dL (ref <130)
Total CHOL/HDL Ratio: 3 (calc) (ref <5.0)
Triglycerides: 85 mg/dL (ref <150)

## 2023-11-10 NOTE — Progress Notes (Unsigned)
 Cardiology Office Note   Date:  11/11/2023   ID:  Kimberly Henry, Kimberly Henry [birth year], MRN [id number]  PCP:  Alba Cory, MD  Cardiologist:   Lorine Bears, MD   No chief complaint on file.      History of Present Illness: Kimberly Henry is a 83 y.o. female who is here today for follow-up visit regarding chronic systolic heart failure due to nonischemic cardiomyopathy and chronic atrial fibrillation. She has known history of essential hypertension, obesity, COPD, hyperlipidemia and osteoarthritis.She is a previous smoker but quit many years ago. She was diagnosed with systolic heart failure in August of 2018.   She was noted to have left bundle branch block.  Cardiac catheterization at that time showed mild nonobstructive coronary artery disease with an EF of 25-30%. Right heart catheterization showed severely elevated filling pressures, severely reduced cardiac output and severe pulmonary hypertension. She was treated with medical therapy and has done well with medications.  She declined CRT at some point given improvement in her symptoms.  Echocardiogram in May 2021 showed an EF of 50 to 55%.  Left atrium was severely dilated.  She was diagnosed with atrial fibrillation around the same time and I felt that the chance of maintaining sinus rhythm was going to be low without an antiarrhythmic medication.    She was hospitalized in November 2023 with dark stool.  EGD showed peptic ulcer disease.  Her anticoagulation was resumed and hemoglobin stabilized.   She has been doing well with no chest pain, shortness of breath or palpitations.  She is noted to be bradycardic today with a heart rate of 44 bpm.  She denies dizziness or syncope.  Past Medical History:  Diagnosis Date   Acid phosphatase elevated    Aortic atherosclerosis (HCC)    Atrial fibrillation (HCC)    a.) CHA2DS2VASc = 6 (age x 2, sex, HFrEF, HTN, vascular disease history);  b.) rate/rhythm maintained on oral carvedilol;  chronically anticoagulated with apixaban   AV block, 1st degree    Cervical cancer (HCC) 1999   COPD (chronic obstructive pulmonary disease) (HCC)    HFrEF (heart failure with reduced ejection fraction) (HCC)    a.) 04/2017 Echo: EF 30-35%, antsept HK, mild MR, mod dil LA, mildly dil RA, mod TR, mildly to mod elev PASP; b.) 08/2017 Echo: EF 30-35%, diff HK, antsept HK. Gr1 DD. Mild to mod MR. PASP nl; c.) TTE 01/22/2020: EF 50-55%, mild LAE, triv MR; RVSP 38.3   Hx of cervical malignancy    Hyperlipidemia    Hypertension    LBBB (left bundle branch block)    Long term current use of anticoagulant    a.) apixaban   Metabolic syndrome    NICM (nonischemic cardiomyopathy) (HCC)    a.) 04/2017 Echo: EF 30-35%; b.) 04/2017 Cath: nonobs dzs; c.) 08/2017 Echo: EF 30-35%, diff HK, antsept HK. Gr1 DD. Mild to mod MR. PASP nl; d.) TTE 01/22/2020: EF 50-55%, mild LAE, triv MR, RVSP 38.3   Non-obstructive CAD (coronary artery disease)    a. 04/2017 Cath: mild nonobs CAD. EF 25-35%. CO 3.14, CI 1.38. Sev PAH [61/34(47].   Obesity, Class III, BMI 40-49.9 (morbid obesity) (HCC)    Osteoarthritis of both knees    PAH (pulmonary arterial hypertension) with portal hypertension (HCC)    a.) 04/2017 Right Heart Cath: Sev PAH 61/34(47); b.) 08/2017 Echo: nl PASP; c.) TTE 01/22/2020: RVSP 38.3    Past Surgical History:  Procedure Laterality Date  ABDOMINAL HYSTERECTOMY  1996   CARDIAC CATHETERIZATION  2018   d/t CHF   CATARACT EXTRACTION Left 2008   CATARACT EXTRACTION W/PHACO Right 03/11/2021   Procedure: CATARACT EXTRACTION PHACO AND INTRAOCULAR LENS PLACEMENT (IOC) RIGHT;  Surgeon: Lockie Mola, MD;  Location: [facility];  Service: Ophthalmology;  Laterality: Right;  7.70 01:31.1   ESOPHAGOGASTRODUODENOSCOPY (EGD) WITH PROPOFOL N/A 08/04/2022   Procedure: ESOPHAGOGASTRODUODENOSCOPY (EGD) WITH PROPOFOL;  Surgeon: Regis Bill, MD;  Location: [facility];  Service: Endoscopy;   Laterality: N/A;   JOINT REPLACEMENT Right 06/2020   KNEE CLOSED REDUCTION Right 10/29/2020   Procedure: CLOSED MANIPULATION KNEE;  Surgeon: Lyndle Herrlich, MD;  Location: [facility];  Service: Orthopedics;  Laterality: Right;   RIGHT/LEFT HEART CATH AND CORONARY ANGIOGRAPHY N/A 04/21/2017   Procedure: RIGHT/LEFT HEART CATH AND CORONARY ANGIOGRAPHY;  Surgeon: Iran Ouch, MD;  Location: [facility];  Service: Cardiovascular;  Laterality: N/A;   TOTAL KNEE ARTHROPLASTY Right 07/02/2020   Procedure: TOTAL KNEE ARTHROPLASTY;  Surgeon: Lyndle Herrlich, MD;  Location: [facility];  Service: Orthopedics;  Laterality: Right;   TOTAL KNEE ARTHROPLASTY Left 06/08/2022   Procedure: TOTAL KNEE ARTHROPLASTY;  Surgeon: Lyndle Herrlich, MD;  Location: [facility];  Service: Orthopedics;  Laterality: Left;     Current Outpatient Medications  Medication Sig Dispense Refill   acetaminophen (TYLENOL) 325 MG tablet Take 650 mg by mouth every 6 (six) hours as needed for mild pain or moderate pain.     dapagliflozin propanediol (FARXIGA) 10 MG TABS tablet Take 1 tablet (10 mg total) by mouth daily before breakfast. 90 tablet 3   ELIQUIS 5 MG TABS tablet TAKE 1 TABLET BY MOUTH TWICE  DAILY 200 tablet 2   levocetirizine (XYZAL) 5 MG tablet TAKE 1 TABLET BY MOUTH IN THE  EVENING 90 tablet 0   Multiple Vitamin (MULTIVITAMIN WITH MINERALS) TABS tablet Take 1 tablet by mouth daily.     oxybutynin (DITROPAN-XL) 5 MG 24 hr tablet TAKE 1 TABLET BY MOUTH AT  BEDTIME 90 tablet 3   pantoprazole (PROTONIX) 20 MG tablet Take 20 mg by mouth daily.     potassium chloride SA (KLOR-CON M) 20 MEQ tablet TAKE 1 TABLET BY MOUTH DAILY 90 tablet 0   rosuvastatin (CRESTOR) 40 MG tablet TAKE 1 TABLET BY MOUTH DAILY 100 tablet 2   sacubitril-valsartan (ENTRESTO) 97-103 MG TAKE 1 TABLET BY MOUTH TWICE  DAILY 180 tablet 0   spironolactone (ALDACTONE) 25 MG tablet TAKE 1 TABLET BY MOUTH DAILY 90 tablet 0   carvedilol (COREG) 3.125 MG  tablet Take 1 tablet (3.125 mg total) by mouth 2 (two) times daily with a meal. 180 tablet 1   No current facility-administered medications for this visit.    Allergies:   Patient has no known allergies.    Social History:  The patient  reports that she quit smoking about 37 years ago. Her smoking use included cigarettes. She started smoking about 48 years ago. She has never used smokeless tobacco. She reports that she does not drink alcohol and does not use drugs.   Family History:  The patient's family history includes Congestive Heart Failure in her father and mother; Hypertension in her mother; Prostate cancer in her son.    ROS:  Please see the history of present illness.   Otherwise, review of systems are positive for none.   All other systems are reviewed and negative.    PHYSICAL EXAM: VS:  BP 128/78  Pulse (!) 44   Ht 5\' 5"  (1.651 m)   Wt 265 lb (120.2 kg)   SpO2 99%   BMI 44.10 kg/m  , BMI Body mass index is 44.1 kg/m. GEN: Well nourished, well developed, in no acute distress  HEENT: normal  Neck: no carotid bruits, or masses. No significant JVD. Cardiac: Irregularly irregular; no murmurs, rubs, or gallops, no edema Respiratory: Clear to auscultation bilaterally, normal work of breathing GI: soft, nontender, nondistended, + BS MS: no deformity or atrophy  Skin: warm and dry, no rash Neuro:  Strength and sensation are intact Psych: euthymic mood, full affect   EKG:  EKG is ordered today. EKG showed: Atrial fibrillation with slow ventricular response Left bundle branch block When compared with ECG of 07-Oct-2023 09:26, QRS axis Shifted right QT has shortened    Recent Labs: 10/24/2023: ALT 14; BUN 16; Creat 1.03; Hemoglobin 13.4; Platelets 221; Potassium 4.2; Sodium 143    Lipid Panel    Component Value Date/Time   CHOL 144 10/24/2023 0944   CHOL 139 12/03/2021 0838   TRIG 85 10/24/2023 0944   HDL 48 (L) 10/24/2023 0944   HDL 47 12/03/2021 0838    CHOLHDL 3.0 10/24/2023 0944   VLDL 22 03/02/2017 1242   LDLCALC 79 10/24/2023 0944      Wt Readings from Last 3 Encounters:  11/11/23 265 lb (120.2 kg)  10/24/23 264 lb 14.4 oz (120.2 kg)  10/07/23 265 lb (120.2 kg)           No data to display            ASSESSMENT AND PLAN:  1.  Chronic systolic heart failure: Due to nonischemic cardiomyopathy.  Chronically in [place] Heart Association class II .  She appears to be euvolemic without any loop diuretics. Most recent echocardiogram in November 2023 showed an EF of 45 to 50% with mild mitral regurgitation. Continue treatment with carvedilol, Entresto, Farxiga and spironolactone.  However, she is bradycardic today and thus I elected to decrease carvedilol to 3.125 mg twice daily. I reviewed her recent labs which showed resolution of anemia and stable renal function with a creatinine of 1.03.  2. Essential hypertension: Blood pressure is well controlled on current medications.  3. Hyperlipidemia: Continue rosuvastatin.  Most recent lipid profile showed an LDL of 79  4.  Chronic atrial fibrillation: Carvedilol was decreased as outlined above given that she is bradycardic.  Continue anticoagulation with Eliquis.     Disposition:   FU in 6 months  Signed,  Lorine Bears, MD  11/11/2023 8:58 AM    [facility]

## 2023-11-11 ENCOUNTER — Ambulatory Visit: Payer: Medicare Other | Attending: Cardiovascular Disease | Admitting: Cardiovascular Disease

## 2023-11-11 VITALS — BP 128/78 | HR 44 | Ht 65.0 in | Wt 265.0 lb

## 2023-11-11 DIAGNOSIS — I482 Chronic atrial fibrillation, unspecified: Secondary | ICD-10-CM

## 2023-11-11 DIAGNOSIS — I5022 Chronic systolic (congestive) heart failure: Secondary | ICD-10-CM

## 2023-11-11 DIAGNOSIS — I10 Essential (primary) hypertension: Secondary | ICD-10-CM

## 2023-11-11 DIAGNOSIS — E785 Hyperlipidemia, unspecified: Secondary | ICD-10-CM

## 2023-11-11 MED ORDER — CARVEDILOL 3.125 MG PO TABS: 3.1250 mg | ORAL_TABLET | Freq: Two times a day (BID) | ORAL | 1 refills | Status: DC

## 2023-11-11 NOTE — Patient Instructions (Addendum)
 Medication Instructions:  DECREASE the Carvedilol to 3.125 mg twice daily  *If you need a refill on your cardiac medications before your next appointment, please call your pharmacy*   Lab Work: None ordered If you have labs (blood work) drawn today and your tests are completely normal, you will receive your results only by: MyChart Message (if you have MyChart) OR A paper copy in the mail If you have any lab test that is abnormal or we need to change your treatment, we will call you to review the results.   Testing/Procedures: None ordered   Follow-Up: At [facility], you and your health needs are our priority.  As part of our continuing mission to provide you with exceptional heart care, we have created designated Provider Care Teams.  These Care Teams include your primary Cardiologist (physician) and Advanced Practice Providers (APPs -  Physician Assistants and Nurse Practitioners) who all work together to provide you with the care you need, when you need it.  We recommend signing up for the patient portal called "MyChart".  Sign up information is provided on this After Visit Summary.  MyChart is used to connect with patients for Virtual Visits (Telemedicine).  Patients are able to view lab/test results, encounter notes, upcoming appointments, etc.  Non-urgent messages can be sent to your provider as well.   To learn more about what you can do with MyChart, go to [email or web address].    Your next appointment:   6 month(s)  Provider:   You may see Lorine Bears, MD or one of the following Advanced Practice Providers on your designated Care Team:   Nicolasa Ducking, NP Eula Listen, PA-C Cadence Fransico Michael, PA-C Charlsie Quest, NP Carlos Levering, NP

## 2023-12-16 ENCOUNTER — Ambulatory Visit: Payer: Self-pay

## 2023-12-16 DIAGNOSIS — Z0000 Encounter for general adult medical examination without abnormal findings: Secondary | ICD-10-CM | POA: Diagnosis not present

## 2023-12-16 NOTE — Patient Instructions (Addendum)
 Ms. Lieber , Thank you for taking time to come for your Medicare Wellness Visit. I appreciate your ongoing commitment to your health goals. Please review the following plan we discussed and let me know if I can assist you in the future.   Referrals/Orders/Follow-Ups/Clinician Recommendations: NONE  This is a list of the screening recommended for you and due dates:  Health Maintenance  Topic Date Due   DTaP/Tdap/Td vaccine (2 - Td or Tdap) 05/02/2022   COVID-19 Vaccine (6 - 2024-25 season) 01/09/2024   Flu Shot  04/13/2024   Medicare Annual Wellness Visit  12/15/2024   Pneumonia Vaccine  Completed   DEXA scan (bone density measurement)  Completed   Zoster (Shingles) Vaccine  Completed   HPV Vaccine  Aged Out    Advanced directives: (ACP Link)Information on Advanced Care Planning can be found at [facility] [place] Advance Health Care Directives Advance Health Care Directives. [email or web address]   Next Medicare Annual Wellness Visit scheduled for next year: Yes  12/21/24 @ 9:30 AM BY PHONE

## 2023-12-16 NOTE — Progress Notes (Signed)
 Subjective:   Kimberly Henry is a 83 y.o. who presents for a Medicare Wellness preventive visit.  Visit Complete: Virtual I connected with  [name] on 12/16/23 by a audio enabled telemedicine application and verified that I am speaking with the correct person using two identifiers.  Patient Location: Home  Provider Location: Office/Clinic  I discussed the limitations of evaluation and management by telemedicine. The patient expressed understanding and agreed to proceed.  Vital Signs: Because this visit was a virtual/telehealth visit, some criteria may be missing or patient reported. Any vitals not documented were not able to be obtained and vitals that have been documented are patient reported.  VideoDeclined- This patient declined Librarian, academic. Therefore the visit was completed with audio only.  Persons Participating in Visit: Patient.  AWV Questionnaire: No: Patient Medicare AWV questionnaire was not completed prior to this visit.  Cardiac Risk Factors include: advanced age (>45men, >26 women);dyslipidemia;hypertension;obesity (BMI >30kg/m2)     Objective:    There were no vitals filed for this visit. There is no height or weight on file to calculate BMI.     12/16/2023   10:21 AM 02/18/2023   10:06 AM 08/02/2022   11:29 PM 06/08/2022   10:18 AM 05/27/2022   10:03 AM 05/28/2021   10:10 AM 10/29/2020    1:56 PM  Advanced Directives  Does Patient Have a Medical Advance Directive? No Yes Yes Yes No No No  Type of Special educational needs teacher of [place];Living will Living will Healthcare Power of Attorney     Does patient want to make changes to medical advance directive?   No - Patient declined No - Patient declined     Copy of Healthcare Power of Attorney in Chart?    No - copy requested     Would patient like information on creating a medical advance directive? No - Patient declined     Yes (MAU/Ambulatory/Procedural Areas -  Information given) No - Patient declined    Current Medications (verified) Outpatient Encounter Medications as of 12/16/2023  Medication Sig   acetaminophen (TYLENOL) 325 MG tablet Take 650 mg by mouth every 6 (six) hours as needed for mild pain or moderate pain.   carvedilol (COREG) 3.125 MG tablet Take 1 tablet (3.125 mg total) by mouth 2 (two) times daily with a meal.   dapagliflozin propanediol (FARXIGA) 10 MG TABS tablet Take 1 tablet (10 mg total) by mouth daily before breakfast.   ELIQUIS 5 MG TABS tablet TAKE 1 TABLET BY MOUTH TWICE  DAILY   levocetirizine (XYZAL) 5 MG tablet TAKE 1 TABLET BY MOUTH IN THE  EVENING   Multiple Vitamin (MULTIVITAMIN WITH MINERALS) TABS tablet Take 1 tablet by mouth daily.   oxybutynin (DITROPAN-XL) 5 MG 24 hr tablet TAKE 1 TABLET BY MOUTH AT  BEDTIME   pantoprazole (PROTONIX) 20 MG tablet Take 20 mg by mouth daily.   potassium chloride SA (KLOR-CON M) 20 MEQ tablet TAKE 1 TABLET BY MOUTH DAILY   rosuvastatin (CRESTOR) 40 MG tablet TAKE 1 TABLET BY MOUTH DAILY   sacubitril-valsartan (ENTRESTO) 97-103 MG TAKE 1 TABLET BY MOUTH TWICE  DAILY   spironolactone (ALDACTONE) 25 MG tablet TAKE 1 TABLET BY MOUTH DAILY   No facility-administered encounter medications on file as of 12/16/2023.    Allergies (verified) Patient has no active allergies.   History: Past Medical History:  Diagnosis Date   Acid phosphatase elevated    Aortic atherosclerosis (HCC)  Atrial fibrillation (HCC)    a.) CHA2DS2VASc = 6 (age x 2, sex, HFrEF, HTN, vascular disease history);  b.) rate/rhythm maintained on oral carvedilol; chronically anticoagulated with apixaban   AV block, 1st degree    Cervical cancer (HCC) 1999   COPD (chronic obstructive pulmonary disease) (HCC)    HFrEF (heart failure with reduced ejection fraction) (HCC)    a.) 04/2017 Echo: EF 30-35%, antsept HK, mild MR, mod dil LA, mildly dil RA, mod TR, mildly to mod elev PASP; b.) 08/2017 Echo: EF 30-35%, diff HK,  antsept HK. Gr1 DD. Mild to mod MR. PASP nl; c.) TTE 01/22/2020: EF 50-55%, mild LAE, triv MR; RVSP 38.3   Hx of cervical malignancy    Hyperlipidemia    Hypertension    LBBB (left bundle branch block)    Long term current use of anticoagulant    a.) apixaban   Metabolic syndrome    NICM (nonischemic cardiomyopathy) (HCC)    a.) 04/2017 Echo: EF 30-35%; b.) 04/2017 Cath: nonobs dzs; c.) 08/2017 Echo: EF 30-35%, diff HK, antsept HK. Gr1 DD. Mild to mod MR. PASP nl; d.) TTE 01/22/2020: EF 50-55%, mild LAE, triv MR, RVSP 38.3   Non-obstructive CAD (coronary artery disease)    a. 04/2017 Cath: mild nonobs CAD. EF 25-35%. CO 3.14, CI 1.38. Sev PAH [61/34(47].   Obesity, Class III, BMI 40-49.9 (morbid obesity) (HCC)    Osteoarthritis of both knees    PAH (pulmonary arterial hypertension) with portal hypertension (HCC)    a.) 04/2017 Right Heart Cath: Sev PAH 61/34(47); b.) 08/2017 Echo: nl PASP; c.) TTE 01/22/2020: RVSP 38.3   Past Surgical History:  Procedure Laterality Date   ABDOMINAL HYSTERECTOMY  1996   CARDIAC CATHETERIZATION  2018   d/t CHF   CATARACT EXTRACTION Left 2008   CATARACT EXTRACTION W/PHACO Right 03/11/2021   Procedure: CATARACT EXTRACTION PHACO AND INTRAOCULAR LENS PLACEMENT (IOC) RIGHT;  Surgeon: Lockie Mola, MD;  [facility]: [facility];  Service: Ophthalmology;  Laterality: Right;  7.70 01:31.1   ESOPHAGOGASTRODUODENOSCOPY (EGD) WITH PROPOFOL N/A 08/04/2022   Procedure: ESOPHAGOGASTRODUODENOSCOPY (EGD) WITH PROPOFOL;  Surgeon: Regis Bill, MD;  Location: [facility];  Service: Endoscopy;  Laterality: N/A;   JOINT REPLACEMENT Right 06/2020   KNEE CLOSED REDUCTION Right 10/29/2020   Procedure: CLOSED MANIPULATION KNEE;  Surgeon: Lyndle Herrlich, MD;  Location: [facility];  Service: Orthopedics;  Laterality: Right;   RIGHT/LEFT HEART CATH AND CORONARY ANGIOGRAPHY N/A 04/21/2017   Procedure: RIGHT/LEFT HEART CATH AND CORONARY ANGIOGRAPHY;  Surgeon: Iran Ouch, MD;  Location: [facility];  Service: Cardiovascular;  Laterality: N/A;   TOTAL KNEE ARTHROPLASTY Right 07/02/2020   Procedure: TOTAL KNEE ARTHROPLASTY;  Surgeon: Lyndle Herrlich, MD;  Location: [facility];  Service: Orthopedics;  Laterality: Right;   TOTAL KNEE ARTHROPLASTY Left 06/08/2022   Procedure: TOTAL KNEE ARTHROPLASTY;  Surgeon: Lyndle Herrlich, MD;  Location: [facility];  Service: Orthopedics;  Laterality: Left;   Family History  Problem Relation Age of Onset   Hypertension Mother    Congestive Heart Failure Mother    Congestive Heart Failure Father    Prostate cancer Son    Social History   Socioeconomic History   Marital status: Widowed    Spouse name: Not on file   Number of children: 4   Years of education: 47   Highest education level: 12th grade  Occupational History   Occupation: helps disabled persons    Comment: part time  Tobacco Use   Smoking status: Former    Current packs/day: 0.00    Types: Cigarettes    Start date: 09/14/1975    Quit date: 06/22/1986    Years since quitting: 37.5   Smokeless tobacco: Never  Vaping Use   Vaping status: Never Used  Substance and Sexual Activity   Alcohol use: No    Alcohol/week: 0.0 standard drinks of alcohol   Drug use: No   Sexual activity: Not Currently  Other Topics Concern   Not on file  Social History Narrative   Patient has 4 grown children-1 of which is deceased, 61 grandchildren & 5 great-grandchildren      Patient lives with her grandson.    Social Drivers of Corporate investment banker Strain: Low Risk  (12/16/2023)   Overall Financial Resource Strain (CARDIA)    Difficulty of Paying Living Expenses: Not hard at all  Food Insecurity: No Food Insecurity (12/16/2023)   Hunger Vital Sign    Worried About Running Out of Food in the Last Year: Never true    Ran Out of Food in the Last Year: Never true  Transportation Needs: No Transportation Needs (12/16/2023)   PRAPARE - Therapist, art (Medical): No    Lack of Transportation (Non-Medical): No  Physical Activity: Insufficiently Active (12/16/2023)   Exercise Vital Sign    Days of Exercise per Week: 3 days    Minutes of Exercise per Session: 30 min  Stress: No Stress Concern Present (12/16/2023)   [employer] of Occupational Health - Occupational Stress Questionnaire    Feeling of Stress : Not at all  Social Connections: Moderately Isolated (12/16/2023)   Social Connection and Isolation Panel [NHANES]    Frequency of Communication with Friends and Family: More than three times a week    Frequency of Social Gatherings with Friends and Family: Once a week    Attends Religious Services: More than 4 times per year    Active Member of [employer] or Organizations: No    Attends Banker Meetings: Never    Marital Status: Widowed    Tobacco Counseling Counseling given: Not Answered    Clinical Intake:  Pre-visit preparation completed: Yes  Pain : No/denies pain     BMI - recorded: 44.1 Nutritional Status: BMI > 30  Obese Nutritional Risks: None Diabetes: No  Lab Results  Component Value Date   HGBA1C 5.4 01/07/2021   HGBA1C 5.1 03/02/2017     How often do you need to have someone help you when you read instructions, pamphlets, or other written materials from your doctor or pharmacy?: 1 - Never  Interpreter Needed?: No  Information entered by :: Kennedy Bucker, LPN   Activities of Daily Living     12/16/2023   10:22 AM 03/25/2023    9:19 AM  In your present state of health, do you have any difficulty performing the following activities:  Hearing? 0 0  Vision? 0 0  Difficulty concentrating or making decisions? 0 0  Walking or climbing stairs? 0 0  Dressing or bathing? 0 0  Doing errands, shopping? 0 0  Preparing Food and eating ? N   Using the Toilet? N   In the past six months, have you accidently leaked urine? Y   Do you have problems with loss of bowel control? N    Managing your Medications? N   Managing your Finances? N   Housekeeping or managing your Housekeeping? N  Patient Care Team: Alba Cory, MD as PCP - General (Family Medicine) Iran Ouch, MD as PCP - Cardiology (Cardiology) Delma Freeze, FNP as Nurse Practitioner (Family Medicine) Iran Ouch, MD as Consulting Physician (Cardiology) Pa, [facility] (Optometry)  Indicate any recent Medical Services you may have received from other than [facility] providers in the past year (date may be approximate).     Assessment:   This is a routine wellness examination for [facility].  Hearing/Vision screen Hearing Screening - Comments:: NO AIDS  Vision Screening - Comments:: WEARS GLASSES ALL THE TIME- [facility]   Goals Addressed             This Visit's Progress    DIET - EAT MORE FRUITS AND VEGETABLES         Depression Screen     12/16/2023   10:20 AM 10/24/2023    9:03 AM 03/25/2023    9:19 AM 02/18/2023   10:04 AM 08/26/2022    8:31 AM 08/02/2022    2:12 PM 02/26/2022    8:53 AM  PHQ 2/9 Scores  PHQ - 2 Score 0 0 0 0 0 0 0  PHQ- 9 Score 0 0 0  0 0 3    Fall Risk     12/16/2023   10:22 AM 10/24/2023    9:03 AM 03/25/2023    9:19 AM 02/18/2023   10:02 AM 08/26/2022    8:31 AM  Fall Risk   Falls in the past year? 0 0 0 0 0  Number falls in past yr: 0 0  0   Injury with Fall? 0 0  0   Risk for fall due to : No Fall Risks No Fall Risks No Fall Risks No Fall Risks No Fall Risks  Follow up Falls prevention discussed;Falls evaluation completed Falls prevention discussed;Education provided;Falls evaluation completed Falls prevention discussed Education provided;Falls prevention discussed Falls prevention discussed;Education provided;Falls evaluation completed    MEDICARE RISK AT HOME:  Medicare Risk at Home Any stairs in or around the home?: Yes If so, are there any without handrails?: No Home free of loose throw rugs in walkways, pet beds, electrical cords,  etc?: Yes Adequate lighting in your home to reduce risk of falls?: Yes Life alert?: No Use of a cane, walker or w/c?: No Grab bars in the bathroom?: Yes Shower chair or bench in shower?: Yes Elevated toilet seat or a handicapped toilet?: No  TIMED UP AND GO:  Was the test performed?  No  Cognitive Function: 6CIT completed        12/16/2023   10:24 AM 02/18/2023   10:11 AM 05/22/2020    3:21 PM 11/07/2018   10:12 AM  6CIT Screen  What Year? 0 points 0 points 0 points 0 points  What month? 0 points 0 points 0 points 0 points  What time? 0 points 0 points 0 points 0 points  Count back from 20 0 points 0 points 0 points 0 points  Months in reverse 0 points 0 points 0 points 0 points  Repeat phrase 0 points 0 points 2 points 4 points  Total Score 0 points 0 points 2 points 4 points    Immunizations Immunization History  Administered Date(s) Administered   Fluad Quad(high Dose 65+) 07/09/2021, 08/04/2022   Influenza, High Dose Seasonal PF 06/22/2016, 07/18/2018, 05/15/2019, 07/11/2023   Influenza, Seasonal, Injecte, Preservative Fre 07/23/2014   Influenza,inj,Quad PF,6+ Mos 05/30/2015, 07/01/2017   Influenza-Unspecified 07/10/2013   Moderna Covid-19 Fall  Seasonal Vaccine 39yrs & older 07/11/2023   Moderna Sars-Covid-2 Vaccination 09/13/2019, 10/12/2019, 08/15/2020, 12/15/2020   Pneumococcal Conjugate-13 05/30/2015   Pneumococcal Polysaccharide-23 05/02/2012   Tdap 05/02/2012   Zoster Recombinant(Shingrix) 07/23/2014, 07/11/2023   Zoster, Live 07/23/2014    Screening Tests Health Maintenance  Topic Date Due   DTaP/Tdap/Td (2 - Td or Tdap) 05/02/2022   COVID-19 Vaccine (6 - 2024-25 season) 01/09/2024   INFLUENZA VACCINE  04/13/2024   Medicare Annual Wellness (AWV)  12/15/2024   Pneumonia Vaccine 24+ Years old  Completed   DEXA SCAN  Completed   Zoster Vaccines- Shingrix  Completed   HPV VACCINES  Aged Out    Health Maintenance  Health Maintenance Due  Topic Date Due    DTaP/Tdap/Td (2 - Td or Tdap) 05/02/2022   Health Maintenance Items Addressed: UP TO DATE ON SHOTS EXCEPT NEEDS TDAP, PNA  Additional Screening:  Vision Screening: Recommended annual ophthalmology exams for early detection of glaucoma and other disorders of the eye.  Dental Screening: Recommended annual dental exams for proper oral hygiene  Community Resource Referral / Chronic Care Management: CRR required this visit?  No   CCM required this visit?  No     Plan:     I have personally reviewed and noted the following in the patient's chart:   Medical and social history Use of alcohol, tobacco or illicit drugs  Current medications and supplements including opioid prescriptions. Patient is not currently taking opioid prescriptions. Functional ability and status Nutritional status Physical activity Advanced directives List of other physicians Hospitalizations, surgeries, and ER visits in previous 12 months Vitals Screenings to include cognitive, depression, and falls Referrals and appointments  In addition, I have reviewed and discussed with patient certain preventive protocols, quality metrics, and best practice recommendations. A written personalized care plan for preventive services as well as general preventive health recommendations were provided to patient.     Hal Hope, LPN   01/13/8412   After Visit Summary: (MyChart) Due to this being a telephonic visit, the after visit summary with patients personalized plan was offered to patient via MyChart   Notes: Nothing significant to report at this time.

## 2024-01-03 ENCOUNTER — Other Ambulatory Visit: Payer: Self-pay | Admitting: Family Medicine

## 2024-01-03 ENCOUNTER — Other Ambulatory Visit: Payer: Self-pay | Admitting: Cardiovascular Disease

## 2024-01-03 DIAGNOSIS — J302 Other seasonal allergic rhinitis: Secondary | ICD-10-CM

## 2024-01-06 ENCOUNTER — Other Ambulatory Visit: Payer: Self-pay | Admitting: Cardiovascular Disease

## 2024-02-23 ENCOUNTER — Other Ambulatory Visit: Payer: Self-pay | Admitting: Cardiovascular Disease

## 2024-02-23 DIAGNOSIS — I4891 Unspecified atrial fibrillation: Secondary | ICD-10-CM

## 2024-02-23 NOTE — Telephone Encounter (Signed)
 Prescription refill request for Eliquis  received. Indication: afib  Last office visit: Arida, 11/11/2023 Scr: 1.03, 10/24/2023 Age:  83 yo  Weight: 120 kg   Refill sent.

## 2024-03-04 ENCOUNTER — Encounter: Payer: Self-pay | Admitting: Emergency Medicine

## 2024-03-04 ENCOUNTER — Emergency Department
Admission: EM | Admit: 2024-03-04 | Discharge: 2024-03-04 | Disposition: A | Discharge status: Home / Self Care | Attending: Emergency Medicine | Admitting: Emergency Medicine

## 2024-03-04 ENCOUNTER — Emergency Department

## 2024-03-04 ENCOUNTER — Other Ambulatory Visit: Payer: Self-pay

## 2024-03-04 DIAGNOSIS — I110 Hypertensive heart disease with heart failure: Secondary | ICD-10-CM | POA: Diagnosis not present

## 2024-03-04 DIAGNOSIS — R42 Dizziness and giddiness: Secondary | ICD-10-CM | POA: Insufficient documentation

## 2024-03-04 DIAGNOSIS — I509 Heart failure, unspecified: Secondary | ICD-10-CM | POA: Diagnosis not present

## 2024-03-04 DIAGNOSIS — I6782 Cerebral ischemia: Secondary | ICD-10-CM | POA: Diagnosis not present

## 2024-03-04 DIAGNOSIS — J449 Chronic obstructive pulmonary disease, unspecified: Secondary | ICD-10-CM | POA: Insufficient documentation

## 2024-03-04 DIAGNOSIS — R55 Syncope and collapse: Secondary | ICD-10-CM | POA: Diagnosis not present

## 2024-03-04 LAB — CBC WITH DIFFERENTIAL/PLATELET
Abs Immature Granulocytes: 0.01 10*3/uL (ref 0.00–0.07)
Basophils Absolute: 0 10*3/uL (ref 0.0–0.1)
Basophils Relative: 0 %
Eosinophils Absolute: 0.1 10*3/uL (ref 0.0–0.5)
Eosinophils Relative: 3 %
HCT: 38.8 % (ref 36.0–46.0)
Hemoglobin: 13 g/dL (ref 12.0–15.0)
Immature Granulocytes: 0 %
Lymphocytes Relative: 44 %
Lymphs Abs: 1.8 10*3/uL (ref 0.7–4.0)
MCH: 32.4 pg (ref 26.0–34.0)
MCHC: 33.5 g/dL (ref 30.0–36.0)
MCV: 96.8 fL (ref 80.0–100.0)
Monocytes Absolute: 0.4 10*3/uL (ref 0.1–1.0)
Monocytes Relative: 10 %
Neutro Abs: 1.7 10*3/uL (ref 1.7–7.7)
Neutrophils Relative %: 43 %
Platelets: 234 10*3/uL (ref 150–400)
RBC: 4.01 MIL/uL (ref 3.87–5.11)
RDW: 11.9 % (ref 11.5–15.5)
WBC: 4 10*3/uL (ref 4.0–10.5)
nRBC: 0 % (ref 0.0–0.2)

## 2024-03-04 LAB — BASIC METABOLIC PANEL WITH GFR
Anion gap: 6 (ref 5–15)
BUN: 15 mg/dL (ref 8–23)
CO2: 23 mmol/L (ref 22–32)
Calcium: 9.1 mg/dL (ref 8.9–10.3)
Chloride: 111 mmol/L (ref 98–111)
Creatinine, Ser: 1.13 mg/dL — ABNORMAL HIGH (ref 0.44–1.00)
GFR, Estimated: 48 mL/min — ABNORMAL LOW (ref >60)
Glucose, Bld: 105 mg/dL — ABNORMAL HIGH (ref 70–99)
Potassium: 4 mmol/L (ref 3.5–5.1)
Sodium: 140 mmol/L (ref 135–145)

## 2024-03-04 LAB — PROTIME-INR
INR: 1.4 — ABNORMAL HIGH (ref 0.8–1.2)
Prothrombin Time: 17.1 s — ABNORMAL HIGH (ref 11.4–15.2)

## 2024-03-04 MED ORDER — MECLIZINE HCL 12.5 MG PO TABS: 12.5000 mg | ORAL_TABLET | Freq: Three times a day (TID) | ORAL | 0 refills | Status: DC | PRN

## 2024-03-04 MED ORDER — MECLIZINE HCL 25 MG PO TABS
12.5000 mg | ORAL_TABLET | Freq: Once | ORAL | Status: AC
  Administered 2024-03-04: 12.5 mg via ORAL
  Filled 2024-03-04: qty 1

## 2024-03-04 NOTE — ED Notes (Signed)
 Patient transported to MRI

## 2024-03-04 NOTE — ED Triage Notes (Signed)
 Pt via POV c/o dizziness at night when pt lays down, noting that she feels an odd sensation in her left ear when she changes position and that is then followed by dizziness. No prior hx vertigo.

## 2024-03-04 NOTE — ED Notes (Signed)
 Pt back to room from MRI. Family at bedside. Pt denies dizziness. No comfort measures requested at this time. Pt attached to cardiac monitor, bed in lowest position, call bell within reach. NADN.

## 2024-03-04 NOTE — ED Provider Notes (Signed)
 [facility] Provider Note    Event Date/Time   First MD Initiated Contact with Patient 03/04/24 1749     (approximate)  History   Chief Complaint: Dizziness  HPI  Kimberly Henry is a 83 y.o. female with a past medical history of atrial fibrillation, COPD, hypertension, hyperlipidemia, CHF, presents to the emergency department for dizziness.  According to the patient earlier today she was on the couch when she began feeling acutely dizzy described as a spinning sensation associated with nausea.  Patient denies any history of similar symptoms.  Denies any prior stroke or vertigo diagnosis.  Patient states she has been experiencing some mild tingling discomfort in the left ear over the last few days.  Physical Exam   Triage Vital Signs: ED Triage Vitals  Encounter Vitals Group     BP 03/04/24 1739 (!) 164/63     Girls Systolic BP Percentile --      Girls Diastolic BP Percentile --      Boys Systolic BP Percentile --      Boys Diastolic BP Percentile --      Pulse Rate 03/04/24 1739 60     Resp 03/04/24 1739 20     Temp 03/04/24 1739 99.1 F (37.3 C)     Temp Source 03/04/24 1739 Oral     SpO2 03/04/24 1739 98 %     Weight 03/04/24 1736 262 lb (118.8 kg)     Height 03/04/24 1736 5' 5 (1.651 m)     Head Circumference --      Peak Flow --      Pain Score 03/04/24 1736 0     Pain Loc --      Pain Education --      Exclude from Growth Chart --     Most recent vital signs: Vitals:   03/04/24 1739 03/04/24 1800  BP: (!) 164/63 (!) 141/77  Pulse: 60   Resp: 20   Temp: 99.1 F (37.3 C)   SpO2: 98% 100%    General: Awake, no distress.  CV:  Good peripheral perfusion.  Regular rate and rhythm  Resp:  Normal effort.  Equal breath sounds bilaterally.  Abd:  No distention.  Soft, nontender.  No rebound or guarding. Other:  Patient has equal grip strength bilaterally 5/5 motor in all extremities.  No pronator drift.  No cranial nerve deficits.   Normal-appearing left tympanic membrane and external auditory canal.   ED Results / Procedures / Treatments   EKG  EKG viewed and interpreted by myself shows atrial fibrillation at 57 bpm with a widened QRS, left axis deviation, largely normal intervals with nonspecific ST changes  RADIOLOGY  MRI is negative for acute infarct.   MEDICATIONS ORDERED IN ED: Medications - No data to display   IMPRESSION / MDM / ASSESSMENT AND PLAN / ED COURSE  I reviewed the triage vital signs and the nursing notes.  Patient's presentation is most consistent with acute presentation with potential threat to life or bodily function.  Patient presents the emergency department for dizziness that started earlier this afternoon.  Patient states she was on the couch and also and became very dizzy which she describes as a spinning sensation.  No history of similar events no history of vertigo in the past.  Patient has a great neurological examination.  States she has been experiencing a tingling sensation in the left ear at times.  No significant findings on physical exam.  Patient's lab work  today shows a reassuring CBC, reassuring chemistry.  Given no history of vertigo in the past given the patient's age and dizziness we will obtain an MRI of the brain to evaluate for possible CVA.  Symptoms seem more suggestive of BPPV.  Patient's MRI is negative.  Will discharge with meclizine to be used as needed.  Patient agreeable to plan of care.  FINAL CLINICAL IMPRESSION(S) / ED DIAGNOSES   Dizziness   Note:  This document was prepared using Dragon voice recognition software and may include unintentional dictation errors.   Dorothyann Drivers, MD 03/04/24 2022

## 2024-03-08 ENCOUNTER — Encounter: Payer: Self-pay | Admitting: Family Medicine

## 2024-03-08 ENCOUNTER — Ambulatory Visit (INDEPENDENT_AMBULATORY_CARE_PROVIDER_SITE_OTHER): Admitting: Family Medicine

## 2024-03-08 VITALS — BP 118/76 | HR 67 | Resp 16 | Ht 65.0 in | Wt 262.0 lb

## 2024-03-08 DIAGNOSIS — Z09 Encounter for follow-up examination after completed treatment for conditions other than malignant neoplasm: Secondary | ICD-10-CM

## 2024-03-08 DIAGNOSIS — J329 Chronic sinusitis, unspecified: Secondary | ICD-10-CM

## 2024-03-08 DIAGNOSIS — R42 Dizziness and giddiness: Secondary | ICD-10-CM | POA: Diagnosis not present

## 2024-03-08 MED ORDER — SALINE SPRAY 0.65 % NA SOLN: 2.0000 | Freq: Two times a day (BID) | NASAL | 2 refills | Status: AC | PRN

## 2024-03-08 MED ORDER — MECLIZINE HCL 12.5 MG PO TABS: 12.5000 mg | ORAL_TABLET | Freq: Three times a day (TID) | ORAL | 0 refills | Status: AC | PRN

## 2024-03-08 MED ORDER — FLUTICASONE PROPIONATE 50 MCG/ACT NA SUSP: 2.0000 | Freq: Every day | NASAL | 6 refills | Status: DC

## 2024-03-08 NOTE — Progress Notes (Signed)
 Patient ID: Kimberly Henry, female    DOB: Apr 01, 1941, 83 y.o.   MRN: [id number]  PCP: Sowles, Krichna, MD  Chief Complaint  Patient presents with   Hospitalization Follow-up   Dizziness    It was the first time it ever happened, getting a lot better    Subjective:   Kimberly Henry is a 83 y.o. female, presents to clinic with CC of the following:  HPI  HFU pt went to the ED on 6/22 for vertigo ER visit and results reviewed thoroughly today She has improving sx, less freq and less severe  She has only used a few of the meclizine and she has not had any episodes like what happened before she went to the ED  She does have sinus congestion, pressure and some tingling/sx in right ear.  No severe sinus pain or pressure, no fever, sweats or body aches She does have seasonal/nasal allergies that have been acting up recently, she is on xyzal  daily per her PCP  Patient Active Problem List   Diagnosis Date Noted   History of gastric ulcer 03/25/2023   Overactive bladder 03/25/2023   Pulmonary hypertension (HCC) 08/26/2022   Hyperkalemia 08/03/2022   Melena 08/03/2022   Acute on chronic diastolic CHF (congestive heart failure) (HCC) 08/02/2022   S/P TKR (total knee replacement) using cement, left 06/08/2022   Status post knee replacement 06/08/2022   Stage 3a chronic kidney disease (HCC) 02/26/2022   Arthritis of left knee 06/09/2021   S/P TKR (total knee replacement) using cement, right 07/02/2020   Simple chronic bronchitis (HCC)    CAD (coronary artery disease)    Chronic a-fib (HCC)    Bradycardia    Lung nodule seen on imaging study 01/09/2020   NICM (nonischemic cardiomyopathy) (HCC) 01/09/2020   Morbid obesity (HCC) 07/18/2018   Chronic systolic heart failure (HCC) 04/28/2017   AKI (acute kidney injury) (HCC) 04/19/2017   Aortic atherosclerosis (HCC) 03/10/2017   Perennial allergic rhinitis with seasonal variation 05/30/2015   1st degree AV block 03/25/2015    Metabolic syndrome 03/03/2015   History of cervical cancer 03/03/2015   Hypertension, benign 03/03/2015   Osteoarthritis, knee 03/03/2015   Dyslipidemia 03/03/2015      Current Outpatient Medications:    acetaminophen  (TYLENOL ) 325 MG tablet, Take 650 mg by mouth every 6 (six) hours as needed for mild pain or moderate pain., Disp: , Rfl:    apixaban  (ELIQUIS ) 5 MG TABS tablet, TAKE 1 TABLET BY MOUTH TWICE  DAILY, Disp: 200 tablet, Rfl: 1   carvedilol  (COREG ) 3.125 MG tablet, TAKE 1 TABLET BY MOUTH TWICE  DAILY WITH A MEAL, Disp: 200 tablet, Rfl: 2   dapagliflozin  propanediol (FARXIGA ) 10 MG TABS tablet, Take 1 tablet (10 mg total) by mouth daily before breakfast., Disp: 90 tablet, Rfl: 3   ENTRESTO  97-103 MG, TAKE 1 TABLET BY MOUTH TWICE  DAILY, Disp: 180 tablet, Rfl: 3   levocetirizine (XYZAL ) 5 MG tablet, TAKE 1 TABLET BY MOUTH EVERY  EVENING, Disp: 90 tablet, Rfl: 0   meclizine (ANTIVERT) 12.5 MG tablet, Take 1 tablet (12.5 mg total) by mouth 3 (three) times daily as needed for dizziness., Disp: 30 tablet, Rfl: 0   Multiple Vitamin (MULTIVITAMIN WITH MINERALS) TABS tablet, Take 1 tablet by mouth daily., Disp: , Rfl:    oxybutynin  (DITROPAN -XL) 5 MG 24 hr tablet, TAKE 1 TABLET BY MOUTH AT  BEDTIME, Disp: 90 tablet, Rfl: 3   pantoprazole  (PROTONIX ) 20 MG tablet, Take  20 mg by mouth daily., Disp: , Rfl:    potassium chloride  SA (KLOR-CON  M) 20 MEQ tablet, TAKE 1 TABLET BY MOUTH DAILY, Disp: 90 tablet, Rfl: 3   rosuvastatin  (CRESTOR ) 40 MG tablet, TAKE 1 TABLET BY MOUTH DAILY, Disp: 100 tablet, Rfl: 2   spironolactone  (ALDACTONE ) 25 MG tablet, TAKE 1 TABLET BY MOUTH DAILY, Disp: 90 tablet, Rfl: 3   No Known Allergies   Social History   Tobacco Use   Smoking status: Former    Current packs/day: 0.00    Types: Cigarettes    Start date: 09/14/1975    Quit date: 06/22/1986    Years since quitting: 37.7   Smokeless tobacco: Never  Vaping Use   Vaping status: Never Used  Substance Use  Topics   Alcohol use: No    Alcohol/week: 0.0 standard drinks of alcohol   Drug use: No      Chart Review Today: I personally reviewed active problem list, medication list, allergies, family history, social history, health maintenance, notes from last encounter, lab results, imaging with the patient/caregiver today.   Review of Systems  HENT:  Positive for congestion.   Neurological:  Negative for dizziness, tremors, syncope, facial asymmetry, weakness, light-headedness, numbness and headaches.  All other systems reviewed and are negative.      Objective:   Vitals:   03/08/24 0939  BP: 118/76  Pulse: 67  Resp: 16  SpO2: 98%  Weight: 262 lb (118.8 kg)  Height: 5' 5 (1.651 m)    Body mass index is 43.6 kg/m.  Physical Exam Vitals and nursing note reviewed.  Constitutional:      General: She is not in acute distress.    Appearance: Normal appearance. She is well-developed. She is obese. She is not ill-appearing, toxic-appearing or diaphoretic.  HENT:     Head: Normocephalic and atraumatic.     Right Ear: Hearing, tympanic membrane, ear canal and external ear normal. No swelling or tenderness. Tympanic membrane is not injected or erythematous.     Left Ear: Hearing, tympanic membrane, ear canal and external ear normal. No swelling or tenderness. Tympanic membrane is not injected or erythematous.     Nose: Mucosal edema and congestion present. No rhinorrhea.     Right Nostril: No epistaxis, septal hematoma or occlusion.     Left Nostril: No epistaxis, septal hematoma or occlusion.     Right Turbinates: Enlarged and pale.     Left Turbinates: Enlarged and pale.     Right Sinus: No maxillary sinus tenderness or frontal sinus tenderness.     Left Sinus: No maxillary sinus tenderness or frontal sinus tenderness.     Comments: Enlarged nasal turbinates bilaterally, inflamed erythematous nasal mucosa R>L    Mouth/Throat:     Lips: Pink.     Mouth: Mucous membranes are moist.      Pharynx: Oropharynx is clear. Uvula midline. No pharyngeal swelling, oropharyngeal exudate, posterior oropharyngeal erythema, uvula swelling or postnasal drip.   Eyes:     General: Lids are normal. No scleral icterus.       Right eye: No discharge.        Left eye: No discharge.     Extraocular Movements: Extraocular movements intact.     Right eye: Normal extraocular motion and no nystagmus.     Left eye: Normal extraocular motion and no nystagmus.     Conjunctiva/sclera: Conjunctivae normal.     Right eye: Right conjunctiva is not injected.     Left  eye: Left conjunctiva is not injected.   Neck:     Trachea: No tracheal deviation.   Cardiovascular:     Rate and Rhythm: Normal rate.  Pulmonary:     Effort: Pulmonary effort is normal. No respiratory distress.     Breath sounds: No stridor.   Skin:    General: Skin is warm and dry.     Findings: No rash.   Neurological:     Mental Status: She is alert.     Cranial Nerves: No dysarthria or facial asymmetry.     Motor: No weakness, tremor or abnormal muscle tone.     Coordination: Coordination is intact. Coordination normal.     Gait: Gait normal.   Psychiatric:        Mood and Affect: Mood normal.        Behavior: Behavior normal. Behavior is cooperative.      Results for orders placed or performed during the hospital encounter of 03/04/24  CBC with Differential   Collection Time: 03/04/24  5:42 PM  Result Value Ref Range   WBC 4.0 4.0 - 10.5 K/uL   RBC 4.01 3.87 - 5.11 MIL/uL   Hemoglobin 13.0 12.0 - 15.0 g/dL   HCT 61.1 63.9 - 53.9 %   MCV 96.8 80.0 - 100.0 fL   MCH 32.4 26.0 - 34.0 pg   MCHC 33.5 30.0 - 36.0 g/dL   RDW 88.0 88.4 - 84.4 %   Platelets 234 150 - 400 K/uL   nRBC 0.0 0.0 - 0.2 %   Neutrophils Relative % 43 %   Neutro Abs 1.7 1.7 - 7.7 K/uL   Lymphocytes Relative 44 %   Lymphs Abs 1.8 0.7 - 4.0 K/uL   Monocytes Relative 10 %   Monocytes Absolute 0.4 0.1 - 1.0 K/uL   Eosinophils Relative 3 %    Eosinophils Absolute 0.1 0.0 - 0.5 K/uL   Basophils Relative 0 %   Basophils Absolute 0.0 0.0 - 0.1 K/uL   Immature Granulocytes 0 %   Abs Immature Granulocytes 0.01 0.00 - 0.07 K/uL  Basic metabolic panel   Collection Time: 03/04/24  5:42 PM  Result Value Ref Range   Sodium 140 135 - 145 mmol/L   Potassium 4.0 3.5 - 5.1 mmol/L   Chloride 111 98 - 111 mmol/L   CO2 23 22 - 32 mmol/L   Glucose, Bld 105 (H) 70 - 99 mg/dL   BUN 15 8 - 23 mg/dL   Creatinine, Ser 8.86 (H) 0.44 - 1.00 mg/dL   Calcium  9.1 8.9 - 10.3 mg/dL   GFR, Estimated 48 (L) >60 mL/min   Anion gap 6 5 - 15  Protime-INR   Collection Time: 03/04/24  5:42 PM  Result Value Ref Range   Prothrombin Time 17.1 (H) 11.4 - 15.2 seconds   INR 1.4 (H) 0.8 - 1.2       Assessment & Plan:     ICD-10-CM   1. Hospital discharge follow-up  Z09    reviewed ED visit hospital records, labs and MRI results thoroughly with pt today    2. Vertigo  R42    suspect it was BPPV, much improved, non-focal neuro here today, pt can continue to use meclizine prn, at this time she declined vestibular PT consult She wants a refill on meclizine - she has 21 pills left, got only 3-4 d ago, sx improving, so hopefully she doesn't need to keep taking and she hopefully does'nt need a refill Pt seems  to be tolerating the 12.5 mg dose fine w/o any severe fatigue SE    3. Rhinosinusitis  J32.9 fluticasone  (FLONASE ) 50 MCG/ACT nasal spray    sodium chloride  (OCEAN) 0.65 % SOLN nasal spray   Pt symptomatic, seasonal allergies, noted on MRI and evident swelling, congestion, inflammation on exam, add flonase  to xyzal , nasal saline or sinus rinses Given her health and age will not a decongestant Today no sinus ttp, no acute worsening and no fever - abx not indicated but pt was encouraged to come back for f/up if there was any acute worsening The congestion is is likely causing her ear sx (tingling in ear) - tx with antihistamine and intranasal steroid  spray, ENT consult an option if any prolonged sx or worsening - today not indicated, but discussed        Return for As needed if not improving.    Michelene Cower, PA-C 03/08/24 10:18 AM

## 2024-03-08 NOTE — Patient Instructions (Signed)
 Continue your allergy medication from Dr. Glenard Start the flonase  nasal spray daily for at least the next 2 weeks.  If it causes sensitivity then try to take it every other day. I also recommend saline nasal sprays or sinus sinses (like a netty pod)   If the congestion or sinus pressure gets worse please come back to be rechecked.  Right now you do not need antibiotics but if you got worse they may be appropriate  Please let us  know of follow up if vertigo episodes return or worsen and we can also get you rechecked and get you a consult with therapy to treat it.

## 2024-05-03 ENCOUNTER — Other Ambulatory Visit: Payer: Self-pay | Admitting: Family Medicine

## 2024-05-03 DIAGNOSIS — I700 Atherosclerosis of aorta: Secondary | ICD-10-CM

## 2024-05-10 ENCOUNTER — Encounter: Payer: Self-pay | Admitting: Family Medicine

## 2024-05-10 ENCOUNTER — Ambulatory Visit (INDEPENDENT_AMBULATORY_CARE_PROVIDER_SITE_OTHER): Payer: Medicare Other | Admitting: Family Medicine

## 2024-05-10 VITALS — BP 134/78 | HR 90 | Resp 16 | Ht 65.0 in | Wt 272.1 lb

## 2024-05-10 DIAGNOSIS — I428 Other cardiomyopathies: Secondary | ICD-10-CM

## 2024-05-10 DIAGNOSIS — I5022 Chronic systolic (congestive) heart failure: Secondary | ICD-10-CM | POA: Diagnosis not present

## 2024-05-10 DIAGNOSIS — J410 Simple chronic bronchitis: Secondary | ICD-10-CM | POA: Diagnosis not present

## 2024-05-10 DIAGNOSIS — N3946 Mixed incontinence: Secondary | ICD-10-CM

## 2024-05-10 DIAGNOSIS — I272 Pulmonary hypertension, unspecified: Secondary | ICD-10-CM

## 2024-05-10 DIAGNOSIS — I482 Chronic atrial fibrillation, unspecified: Secondary | ICD-10-CM | POA: Diagnosis not present

## 2024-05-10 DIAGNOSIS — N1831 Chronic kidney disease, stage 3a: Secondary | ICD-10-CM

## 2024-05-10 DIAGNOSIS — K279 Peptic ulcer, site unspecified, unspecified as acute or chronic, without hemorrhage or perforation: Secondary | ICD-10-CM

## 2024-05-10 DIAGNOSIS — I739 Peripheral vascular disease, unspecified: Secondary | ICD-10-CM | POA: Diagnosis not present

## 2024-05-10 DIAGNOSIS — J302 Other seasonal allergic rhinitis: Secondary | ICD-10-CM

## 2024-05-10 DIAGNOSIS — I700 Atherosclerosis of aorta: Secondary | ICD-10-CM | POA: Diagnosis not present

## 2024-05-10 DIAGNOSIS — H9312 Tinnitus, left ear: Secondary | ICD-10-CM

## 2024-05-10 MED ORDER — OXYBUTYNIN CHLORIDE ER 10 MG PO TB24: 10.0000 mg | ORAL_TABLET | Freq: Every day | ORAL | 1 refills | Status: DC

## 2024-05-10 MED ORDER — PANTOPRAZOLE SODIUM 20 MG PO TBEC: 20.0000 mg | DELAYED_RELEASE_TABLET | Freq: Every day | ORAL | 3 refills | Status: AC

## 2024-05-10 MED ORDER — ROSUVASTATIN CALCIUM 40 MG PO TABS: 40.0000 mg | ORAL_TABLET | Freq: Every day | ORAL | 3 refills | Status: AC

## 2024-05-10 MED ORDER — LEVOCETIRIZINE DIHYDROCHLORIDE 5 MG PO TABS: 5.0000 mg | ORAL_TABLET | Freq: Every evening | ORAL | 3 refills | Status: AC

## 2024-05-10 MED ORDER — FLUTICASONE PROPIONATE 50 MCG/ACT NA SUSP: 2.0000 | Freq: Every day | NASAL | 1 refills | Status: AC

## 2024-05-10 NOTE — Progress Notes (Signed)
 Name: Kimberly Henry   MRN: [id number]    DOB: May 10, 1941   Date:05/10/2024       Progress Note  Subjective  Chief Complaint  Chief Complaint  Patient presents with   Medical Management of Chronic Issues   Discussed the use of AI scribe software for clinical note transcription with the patient, who gave verbal consent to proceed.  History of Present Illness Kimberly Henry is an 83 year old female with atrial fibrillation and hypertension who presents for follow-up after an episode of vertigo.  She experienced a severe spinning sensation and nausea a couple of months ago after returning from church, which led to an emergency room visit. An MRI revealed mild chronic microvascular ischemic changes and sinus issues. She was treated with medication and a nasal spray, and her symptoms have since improved. She currently experiences occasional tinnitus in her left ear, which she manages by placing a piece of cotton in the ear.  She has atrial fibrillation and Chronic CHF due to non ischemic cardiomyopathy. Her blood pressure was elevated during the emergency room visit but improved before discharge. She reports taking carvedilol , Eliquis , Farxiga ,Entresto   and spironolactone  f. No significant symptoms such as palpitations or orthopnea or lower extremity edema  She has  small vessel disease and aortic atherosclerosis, for which she takes rosuvastatin  and denies side effects   She has pulmonary hypertension and chronic bronchitis but reports no symptoms such as cough or wheezing. She does not use any inhalers or breathing medications. She states unable to tolerate CPAP for OSA  She experiences urinary incontinence and uses pads regularly. She is currently taking ditropan  5 mg for urinary incontinence.  She has a history of peptic ulcer disease and is taking pantoprazole  daily. No current symptoms such as pain or indigestion.  She consumes sweets and sodas. She is aware of her morbid obesity and is  willing to try changing her diet but cutting down on sweet beverages and eating more protein     Patient Active Problem List   Diagnosis Date Noted   History of gastric ulcer 03/25/2023   Overactive bladder 03/25/2023   Pulmonary hypertension (HCC) 08/26/2022   Hyperkalemia 08/03/2022   Melena 08/03/2022   Acute on chronic diastolic CHF (congestive heart failure) (HCC) 08/02/2022   S/P TKR (total knee replacement) using cement, left 06/08/2022   Status post knee replacement 06/08/2022   Stage 3a chronic kidney disease (HCC) 02/26/2022   Arthritis of left knee 06/09/2021   S/P TKR (total knee replacement) using cement, right 07/02/2020   Simple chronic bronchitis (HCC)    CAD (coronary artery disease)    Chronic a-fib (HCC)    Bradycardia    Lung nodule seen on imaging study 01/09/2020   NICM (nonischemic cardiomyopathy) (HCC) 01/09/2020   Morbid obesity (HCC) 07/18/2018   Chronic systolic heart failure (HCC) 04/28/2017   AKI (acute kidney injury) (HCC) 04/19/2017   Aortic atherosclerosis (HCC) 03/10/2017   Perennial allergic rhinitis with seasonal variation 05/30/2015   1st degree AV block 03/25/2015   Metabolic syndrome 03/03/2015   History of cervical cancer 03/03/2015   Hypertension, benign 03/03/2015   Osteoarthritis, knee 03/03/2015   Dyslipidemia 03/03/2015    Past Surgical History:  Procedure Laterality Date   ABDOMINAL HYSTERECTOMY  1996   CARDIAC CATHETERIZATION  2018   d/t CHF   CATARACT EXTRACTION Left 2008   CATARACT EXTRACTION W/PHACO Right 03/11/2021   Procedure: CATARACT EXTRACTION PHACO AND INTRAOCULAR LENS PLACEMENT (IOC) RIGHT;  Surgeon:  Mittie Gaskin, MD;  Location: [facility];  Service: Ophthalmology;  Laterality: Right;  7.70 01:31.1   ESOPHAGOGASTRODUODENOSCOPY (EGD) WITH PROPOFOL  N/A 08/04/2022   Procedure: ESOPHAGOGASTRODUODENOSCOPY (EGD) WITH PROPOFOL ;  Surgeon: Maryruth Ole DASEN, MD;  Location: [facility];  Service:  Endoscopy;  Laterality: N/A;   JOINT REPLACEMENT Right 06/2020   KNEE CLOSED REDUCTION Right 10/29/2020   Procedure: CLOSED MANIPULATION KNEE;  Surgeon: Leora Lynwood SAUNDERS, MD;  Location: [facility];  Service: Orthopedics;  Laterality: Right;   RIGHT/LEFT HEART CATH AND CORONARY ANGIOGRAPHY N/A 04/21/2017   Procedure: RIGHT/LEFT HEART CATH AND CORONARY ANGIOGRAPHY;  Surgeon: Darron Deatrice LABOR, MD;  Location: [facility];  Service: Cardiovascular;  Laterality: N/A;   TOTAL KNEE ARTHROPLASTY Right 07/02/2020   Procedure: TOTAL KNEE ARTHROPLASTY;  Surgeon: Leora Lynwood SAUNDERS, MD;  Location: [facility];  Service: Orthopedics;  Laterality: Right;   TOTAL KNEE ARTHROPLASTY Left 06/08/2022   Procedure: TOTAL KNEE ARTHROPLASTY;  Surgeon: Leora Lynwood SAUNDERS, MD;  Location: [facility];  Service: Orthopedics;  Laterality: Left;    Family History  Problem Relation Age of Onset   Hypertension Mother    Congestive Heart Failure Mother    Congestive Heart Failure Father    Prostate cancer Son     Social History   Tobacco Use   Smoking status: Former    Current packs/day: 0.00    Types: Cigarettes    Start date: 09/14/1975    Quit date: 06/22/1986    Years since quitting: 37.9   Smokeless tobacco: Never  Substance Use Topics   Alcohol use: No    Alcohol/week: 0.0 standard drinks of alcohol     Current Outpatient Medications:    acetaminophen  (TYLENOL ) 325 MG tablet, Take 650 mg by mouth every 6 (six) hours as needed for mild pain or moderate pain., Disp: , Rfl:    apixaban  (ELIQUIS ) 5 MG TABS tablet, TAKE 1 TABLET BY MOUTH TWICE  DAILY, Disp: 200 tablet, Rfl: 1   carvedilol  (COREG ) 3.125 MG tablet, TAKE 1 TABLET BY MOUTH TWICE  DAILY WITH A MEAL, Disp: 200 tablet, Rfl: 2   dapagliflozin  propanediol (FARXIGA ) 10 MG TABS tablet, Take 1 tablet (10 mg total) by mouth daily before breakfast., Disp: 90 tablet, Rfl: 3   ENTRESTO  97-103 MG, TAKE 1 TABLET BY MOUTH TWICE  DAILY, Disp: 180 tablet, Rfl: 3    fluticasone  (FLONASE ) 50 MCG/ACT nasal spray, Place 2 sprays into both nostrils daily., Disp: 16 g, Rfl: 6   levocetirizine (XYZAL ) 5 MG tablet, TAKE 1 TABLET BY MOUTH EVERY  EVENING, Disp: 90 tablet, Rfl: 0   meclizine  (ANTIVERT ) 12.5 MG tablet, Take 1 tablet (12.5 mg total) by mouth 3 (three) times daily as needed for dizziness., Disp: 30 tablet, Rfl: 0   Multiple Vitamin (MULTIVITAMIN WITH MINERALS) TABS tablet, Take 1 tablet by mouth daily., Disp: , Rfl:    oxybutynin  (DITROPAN -XL) 5 MG 24 hr tablet, TAKE 1 TABLET BY MOUTH AT  BEDTIME, Disp: 90 tablet, Rfl: 3   pantoprazole  (PROTONIX ) 20 MG tablet, Take 20 mg by mouth daily., Disp: , Rfl:    potassium chloride  SA (KLOR-CON  M) 20 MEQ tablet, TAKE 1 TABLET BY MOUTH DAILY, Disp: 90 tablet, Rfl: 3   rosuvastatin  (CRESTOR ) 40 MG tablet, TAKE 1 TABLET BY MOUTH DAILY, Disp: 100 tablet, Rfl: 2   sodium chloride  (OCEAN) 0.65 % SOLN nasal spray, Place 2 sprays into both nostrils 2 (two) times daily as needed for congestion., Disp: 88 mL, Rfl: 2  spironolactone  (ALDACTONE ) 25 MG tablet, TAKE 1 TABLET BY MOUTH DAILY, Disp: 90 tablet, Rfl: 3  No Known Allergies  I personally reviewed active problem list, medication list, allergies, family history with the patient/caregiver today.   ROS  Ten systems reviewed and is negative except as mentioned in HPI    Objective Physical Exam VITALS: BP- 134/78 MEASUREMENTS: BMI- 45.0. CONSTITUTIONAL: Patient appears well-developed and well-nourished. No distress. HEENT: Head atraumatic, normocephalic, neck supple. CARDIOVASCULAR: Normal rate, regular rhythm and normal heart sounds. No murmur heard. No BLE edema. PULMONARY: Effort normal and breath sounds normal. No respiratory distress. ABDOMINAL: There is no tenderness or distention. MUSCULOSKELETAL: Normal gait. Without gross motor or sensory deficit. PSYCHIATRIC: Patient has a normal mood and affect. Behavior is normal. Judgment and thought content  normal. NEUROLOGICAL: Neurological exam normal.  Vitals:   05/10/24 0957  BP: 134/78  Pulse: 90  Resp: 16  SpO2: 99%  Weight: 272 lb 1.6 oz (123.4 kg)  Height: 5' 5 (1.651 m)    Body mass index is 45.28 kg/m.  Recent Results (from the past 2160 hours)  CBC with Differential     Status: None   Collection Time: 03/04/24  5:42 PM  Result Value Ref Range   WBC 4.0 4.0 - 10.5 K/uL   RBC 4.01 3.87 - 5.11 MIL/uL   Hemoglobin 13.0 12.0 - 15.0 g/dL   HCT 61.1 63.9 - 53.9 %   MCV 96.8 80.0 - 100.0 fL   MCH 32.4 26.0 - 34.0 pg   MCHC 33.5 30.0 - 36.0 g/dL   RDW 88.0 88.4 - 84.4 %   Platelets 234 150 - 400 K/uL   nRBC 0.0 0.0 - 0.2 %   Neutrophils Relative % 43 %   Neutro Abs 1.7 1.7 - 7.7 K/uL   Lymphocytes Relative 44 %   Lymphs Abs 1.8 0.7 - 4.0 K/uL   Monocytes Relative 10 %   Monocytes Absolute 0.4 0.1 - 1.0 K/uL   Eosinophils Relative 3 %   Eosinophils Absolute 0.1 0.0 - 0.5 K/uL   Basophils Relative 0 %   Basophils Absolute 0.0 0.0 - 0.1 K/uL   Immature Granulocytes 0 %   Abs Immature Granulocytes 0.01 0.00 - 0.07 K/uL    Comment: Performed at [facility], [address] [place], [place] [address]  Basic metabolic panel     Status: Abnormal   Collection Time: 03/04/24  5:42 PM  Result Value Ref Range   Sodium 140 135 - 145 mmol/L   Potassium 4.0 3.5 - 5.1 mmol/L   Chloride 111 98 - 111 mmol/L   CO2 23 22 - 32 mmol/L   Glucose, Bld 105 (H) 70 - 99 mg/dL    Comment: Glucose reference range applies only to samples taken after fasting for at least 8 hours.   BUN 15 8 - 23 mg/dL   Creatinine, Ser 8.86 (H) 0.44 - 1.00 mg/dL   Calcium  9.1 8.9 - 10.3 mg/dL   GFR, Estimated 48 (L) >60 mL/min    Comment: (NOTE) Calculated using the CKD-EPI Creatinine Equation (2021)    Anion gap 6 5 - 15    Comment: Performed at [facility], [address] [place], [place] [address]  Protime-INR     Status: Abnormal   Collection Time: 03/04/24  5:42 PM  Result  Value Ref Range   Prothrombin Time 17.1 (H) 11.4 - 15.2 seconds   INR 1.4 (H) 0.8 - 1.2    Comment: (NOTE) INR  goal varies based on device and disease states. Performed at [facility], [address] [place], [place] [address]     Diabetic Foot Exam:     PHQ2/9:    05/10/2024    9:52 AM 12/16/2023   10:20 AM 10/24/2023    9:03 AM 03/25/2023    9:19 AM 02/18/2023   10:04 AM  Depression screen PHQ 2/9  Decreased Interest 0 0 0 0 0  Down, Depressed, Hopeless 0 0 0 0 0  PHQ - 2 Score 0 0 0 0 0  Altered sleeping  0 0 0   Tired, decreased energy  0 0 0   Change in appetite  0 0 0   Feeling bad or failure about yourself   0 0 0   Trouble concentrating  0 0 0   Moving slowly or fidgety/restless  0 0 0   Suicidal thoughts  0 0 0   PHQ-9 Score  0 0 0   Difficult doing work/chores  Not difficult at all Not difficult at all      phq 9 is negative  Fall Risk:    05/10/2024    9:52 AM 12/16/2023   10:22 AM 10/24/2023    9:03 AM 03/25/2023    9:19 AM 02/18/2023   10:02 AM  Fall Risk   Falls in the past year? 0 0 0 0 0  Number falls in past yr: 0 0 0  0  Injury with Fall? 0 0 0  0  Risk for fall due to : No Fall Risks No Fall Risks No Fall Risks No Fall Risks No Fall Risks  Follow up Falls evaluation completed Falls prevention discussed;Falls evaluation completed Falls prevention discussed;Education provided;Falls evaluation completed Falls prevention discussed Education provided;Falls prevention discussed      Assessment & Plan Benign paroxysmal positional vertigo and left-sided tinnitus Diagnosed with BPPV and left-sided tinnitus without hearing loss. MRI showed mild chronic microvascular ischemic changes and sinus issues. - Refer to ENT for evaluation, possible Epley maneuver, and hearing test. - Continue meclizine  as needed. - Continue fluticasone .  Chronic atrial fibrillation and chronic systolic heart failure due to non-ischemic cardiomyopathy Managed by  cardiology. On multiple medications with well-controlled blood pressure and no symptoms. Carvedilol  dose decreased due to low heart rate, tolerating well. - Continue carvedilol , Eliquis , Triostat, Farxiga , and spironolactone  as prescribed.  Pulmonary hypertension Identified on echocardiogram. Previously evaluated for sleep apnea but could not tolerate CPAP. Reports no symptoms.  Chronic kidney disease stage 3a Recent labs showed improved kidney function. - continue ARB and SGL-2 agonist  - Re-evaluate kidney function at next visit.  Simple chronic bronchitis/Pulmonary htn unable to tolerate CPAP Reports no symptoms and does not use inhalers or breathing medications.  Atherosclerosis of aorta and cerebral small vessel disease On rosuvastatin  40 mg for dyslipidemia, reports no side effects. - Continue rosuvastatin  40 mg daily.  Morbid obesity BMI over 45. Acknowledges excessive consumption of sweets and sugary beverages. - Encourage dietary changes to reduce sugar intake and increase protein consumption. - Advise drinking water instead of sugary beverages.  Mixed urinary incontinence and overactive bladder Experiences mixed urinary incontinence, likely exacerbated by diuretics. Uses pads regularly and feels ditropan  5 mg is not effective. - Increase ditropan  to 10 mg. - Monitor for side effects such as dry mouth.  Peptic ulcer disease Advised by GI to continue pantoprazole . Reports no current symptoms. - Prescribe pantoprazole  for ongoing management.  Chronic sinusitis and allergic rhinitis Managed with fluticasone   and Xyzal . Reports improvement. - Continue fluticasone  and Xyzal . - Provide refills for fluticasone  and Xyzal .

## 2024-07-04 ENCOUNTER — Ambulatory Visit: Payer: Self-pay

## 2024-07-04 NOTE — Telephone Encounter (Signed)
 FYI Only or Action Required?: Action required by provider: request for appointment and update on patient condition.  Patient was last seen in primary care on 05/10/2024 by Glenard Mire, MD.  Called Nurse Triage reporting Gas and Chest Pain.  Symptoms began today.  Interventions attempted: OTC medications: tums.  Symptoms are: completely resolved.  Triage Disposition: Call PCP Within 24 Hours  Patient/caregiver understands and will follow disposition?: Yes  Copied from CRM #[id number]. Topic: Clinical - Red Word Triage >> Jul 04, 2024  2:53 PM Tobias L wrote: Red Word that prompted transfer to Nurse Triage: pain in chest, gas in chest, cold symptoms for the last two weeks, congestion Reason for Disposition  [1] Patient says chest pain feels exactly the same as previously diagnosed heartburn AND [2] describes burning in chest AND [3] accompanying sour taste in mouth  Answer Assessment - Initial Assessment Questions 1. LOCATION: Where does it hurt?       States that she had brief gas pain in her mid chest 2. RADIATION: Does the pain go anywhere else? (e.g., into neck, jaw, arms, back)     denies 3. ONSET: When did the chest pain begin? (Minutes, hours or days)      Right before time of call, 1500 4. PATTERN: Does the pain come and go, or has it been constant since it started?  Does it get worse with exertion?      Pain has resolved a way and has not reoccurred 5. DURATION: How long does it last (e.g., seconds, minutes, hours)     A few seconds 6. SEVERITY: How bad is the pain?  (e.g., Scale 1-10; mild, moderate, or severe)     Sharp, but brief 7. CARDIAC RISK FACTORS: Do you have any history of heart problems or risk factors for heart disease? (e.g., angina, prior heart attack; diabetes, high blood pressure, high cholesterol, smoker, or strong family history of heart disease)     HTN, CHF 8. PULMONARY RISK FACTORS: Do you have any history of lung disease?  (e.g.,  blood clots in lung, asthma, emphysema, birth control pills)     N/a 9. CAUSE: What do you think is causing the chest pain?     Patient attributes to gas, and took a tums 10. OTHER SYMPTOMS: Do you have any other symptoms? (e.g., dizziness, nausea, vomiting, sweating, fever, difficulty breathing, cough)       denies 11. PREGNANCY: Is there any chance you are pregnant? When was your last menstrual period?       N/a  Protocols used: Chest Pain-A-AH

## 2024-07-10 ENCOUNTER — Other Ambulatory Visit: Payer: Self-pay | Admitting: Family Medicine

## 2024-07-10 ENCOUNTER — Other Ambulatory Visit: Payer: Self-pay | Admitting: Family

## 2024-07-10 DIAGNOSIS — N3946 Mixed incontinence: Secondary | ICD-10-CM

## 2024-08-26 ENCOUNTER — Other Ambulatory Visit: Payer: Self-pay | Admitting: Cardiovascular Disease

## 2024-08-29 ENCOUNTER — Other Ambulatory Visit: Payer: Self-pay | Admitting: Cardiovascular Disease

## 2024-08-29 DIAGNOSIS — I4891 Unspecified atrial fibrillation: Secondary | ICD-10-CM

## 2024-08-29 NOTE — Telephone Encounter (Signed)
 Prescription refill request for Eliquis  received. Indication:afib Last office visit:2/25 Scr: 1.13  6/25 Age:83 Weight:123.4  kg  Prescription refilled

## 2024-08-30 ENCOUNTER — Telehealth: Payer: Self-pay | Admitting: Cardiovascular Disease

## 2024-08-30 NOTE — Telephone Encounter (Signed)
°*  STAT* If patient is at the pharmacy, call can be transferred to refill team.   1. Which medications need to be refilled? (please list name of each medication and dose if known)   ENTRES TO 97-103 MG     4. Which pharmacy/location (including street and city if local pharmacy) is medication to be sent to? [facility] - [place], [place] - [address]     5. Do they need a 30 day or 90 day supply? 90   Pt was trying to schedule her appt but I could not hear talking anymore. I hung up and tried to call back but call went straight to VM.

## 2024-08-31 NOTE — Telephone Encounter (Signed)
 Returned call to pt, left a message that a year supply of Entresto  was sent to [facility], 12/2023, should be good until 12/2024, and to call if she needed further assistance.

## 2024-09-14 ENCOUNTER — Other Ambulatory Visit: Payer: Self-pay | Admitting: Family Medicine

## 2024-09-14 ENCOUNTER — Other Ambulatory Visit: Payer: Self-pay | Admitting: Cardiovascular Disease

## 2024-09-14 DIAGNOSIS — N3946 Mixed incontinence: Secondary | ICD-10-CM

## 2024-09-17 NOTE — Telephone Encounter (Signed)
 Requested Prescriptions  Pending Prescriptions Disp Refills   oxybutynin  (DITROPAN -XL) 10 MG 24 hr tablet [Pharmacy Med Name: Oxybutynin  Chloride ER 10 MG Oral Tablet Extended Release 24 Hour] 90 tablet 0    Sig: TAKE 1 TABLET BY MOUTH AT  BEDTIME     Urology:  Bladder Agents Passed - 09/17/2024  1:41 PM      Passed - Valid encounter within last 12 months    Recent Outpatient Visits           4 months ago Small vessel disease   [place] [facility] Glenard Mire, MD   6 months ago Hospital discharge follow-up   [facility] Leavy Mole, PA-C   10 months ago Chronic systolic heart failure [facility])   [place] [facility] Glenard Mire, MD       Future Appointments             In 4 weeks Darron, Deatrice LABOR, MD [facility] [place]   In 1 month Sowles, Krichna, MD [facility], [place]

## 2024-10-16 ENCOUNTER — Encounter: Payer: Self-pay | Admitting: Cardiovascular Disease

## 2024-10-16 ENCOUNTER — Ambulatory Visit: Admitting: Cardiovascular Disease

## 2024-10-16 VITALS — BP 110/70 | HR 66 | Ht 67.0 in | Wt 270.4 lb

## 2024-10-16 DIAGNOSIS — I10 Essential (primary) hypertension: Secondary | ICD-10-CM | POA: Diagnosis not present

## 2024-10-16 DIAGNOSIS — I5022 Chronic systolic (congestive) heart failure: Secondary | ICD-10-CM

## 2024-10-16 DIAGNOSIS — E785 Hyperlipidemia, unspecified: Secondary | ICD-10-CM

## 2024-10-16 DIAGNOSIS — I4891 Unspecified atrial fibrillation: Secondary | ICD-10-CM | POA: Diagnosis not present

## 2024-10-16 MED ORDER — SACUBITRIL-VALSARTAN 97-103 MG PO TABS: 1.0000 | ORAL_TABLET | Freq: Two times a day (BID) | ORAL | 3 refills | Status: AC

## 2024-10-16 NOTE — Patient Instructions (Signed)

## 2024-11-12 ENCOUNTER — Ambulatory Visit: Admitting: Family Medicine

## 2024-12-21 ENCOUNTER — Ambulatory Visit
